# Patient Record
Sex: Male | Born: 1970
Health system: Southern US, Community
[De-identification: ages and names within clinical notes are randomized; demographics above are authoritative.]

## PROBLEM LIST (undated history)

## (undated) DIAGNOSIS — E119 Type 2 diabetes mellitus without complications: Secondary | ICD-10-CM

## (undated) DIAGNOSIS — F329 Major depressive disorder, single episode, unspecified: Secondary | ICD-10-CM

## (undated) DIAGNOSIS — E785 Hyperlipidemia, unspecified: Secondary | ICD-10-CM

## (undated) DIAGNOSIS — Z9289 Personal history of other medical treatment: Secondary | ICD-10-CM

## (undated) DIAGNOSIS — N201 Calculus of ureter: Secondary | ICD-10-CM

## (undated) DIAGNOSIS — F1010 Alcohol abuse, uncomplicated: Secondary | ICD-10-CM

## (undated) DIAGNOSIS — F909 Attention-deficit hyperactivity disorder, unspecified type: Secondary | ICD-10-CM

## (undated) DIAGNOSIS — F32A Depression, unspecified: Secondary | ICD-10-CM

## (undated) DIAGNOSIS — Z87442 Personal history of urinary calculi: Secondary | ICD-10-CM

## (undated) DIAGNOSIS — N200 Calculus of kidney: Secondary | ICD-10-CM

## (undated) DIAGNOSIS — R252 Cramp and spasm: Secondary | ICD-10-CM

## (undated) HISTORY — DX: Type 2 diabetes mellitus without complications: E11.9

## (undated) HISTORY — PX: CARDIAC CATHETERIZATION: SHX172

## (undated) HISTORY — PX: TRANSTHORACIC ECHOCARDIOGRAM: SHX275

## (undated) HISTORY — DX: Depression, unspecified: F32.A

## (undated) HISTORY — DX: Alcohol abuse, uncomplicated: F10.10

## (undated) HISTORY — DX: Hyperlipidemia, unspecified: E78.5

## (undated) HISTORY — DX: Major depressive disorder, single episode, unspecified: F32.9

---

## 1998-06-06 ENCOUNTER — Emergency Department (HOSPITAL_COMMUNITY): Admission: EM | Admit: 1998-06-06 | Discharge: 1998-06-06 | Payer: Self-pay | Admitting: Emergency Medicine

## 2004-05-10 ENCOUNTER — Emergency Department (HOSPITAL_COMMUNITY): Admission: EM | Admit: 2004-05-10 | Discharge: 2004-05-10 | Payer: Self-pay | Admitting: Emergency Medicine

## 2006-01-07 ENCOUNTER — Encounter: Admission: RE | Admit: 2006-01-07 | Discharge: 2006-01-07 | Payer: Self-pay | Admitting: Internal Medicine

## 2006-01-07 IMAGING — CR DG LUMBAR SPINE COMPLETE 4+V
6 series · 6 of 6 positions shown · non-contrast
Comparison: none

CLINICAL DATA: Fall while ice skating.  Low back and sacral pain. 
 FOUR VIEW LUMBAR SPINE:
 There is no evidence of fracture.  Alignment is normal.  The intervertebral disc spaces are within normal limits and no other significant bone abnormalities are identified. 
 IMPRESSION
 Normal study.

[view not recorded (1 of 6)]
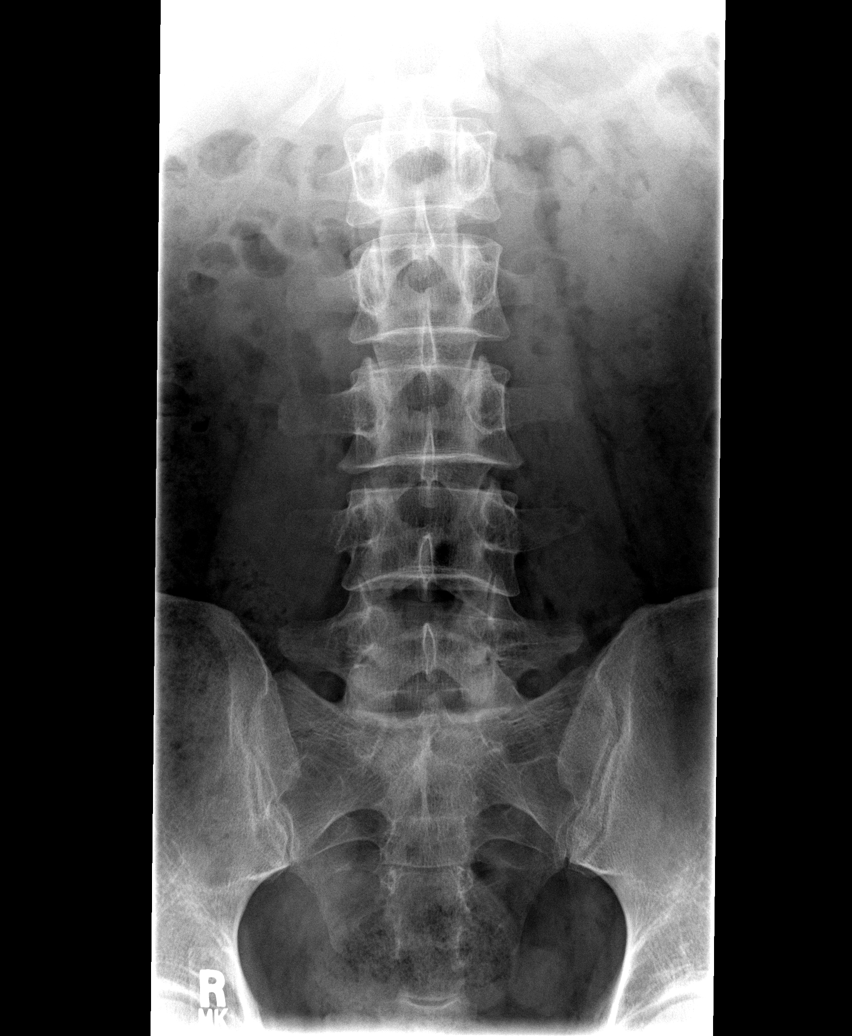

[view not recorded (2 of 6)]
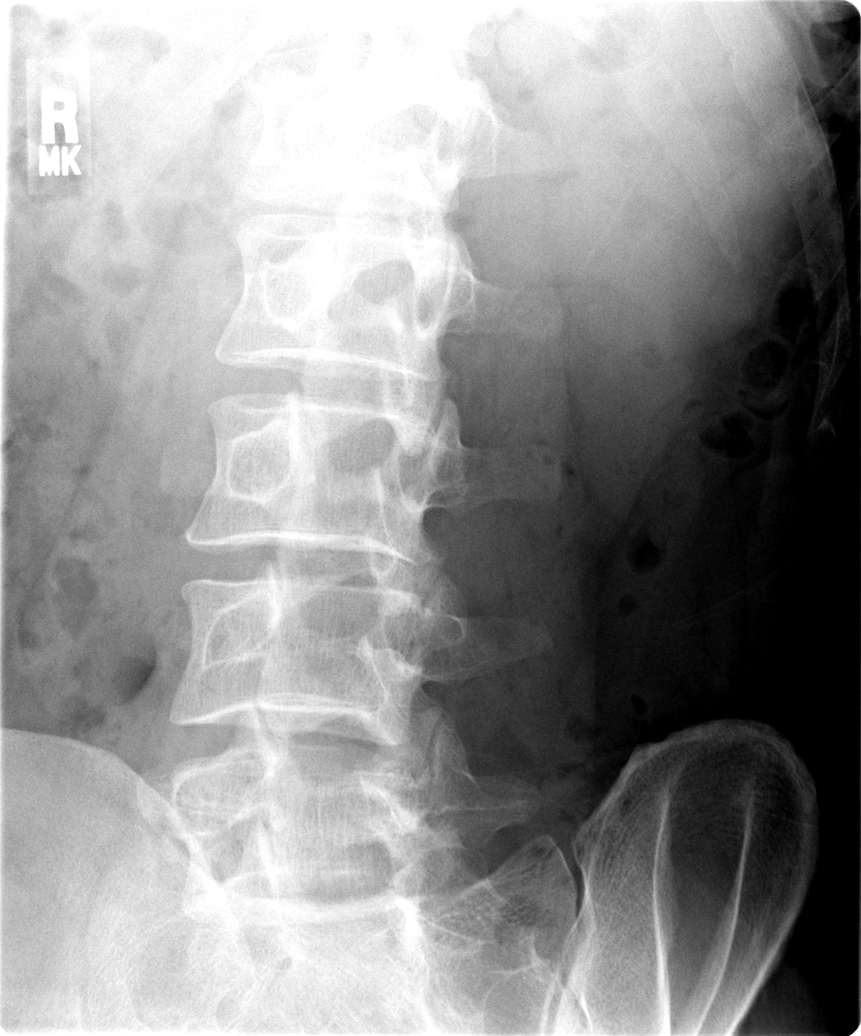

[view not recorded (3 of 6)]
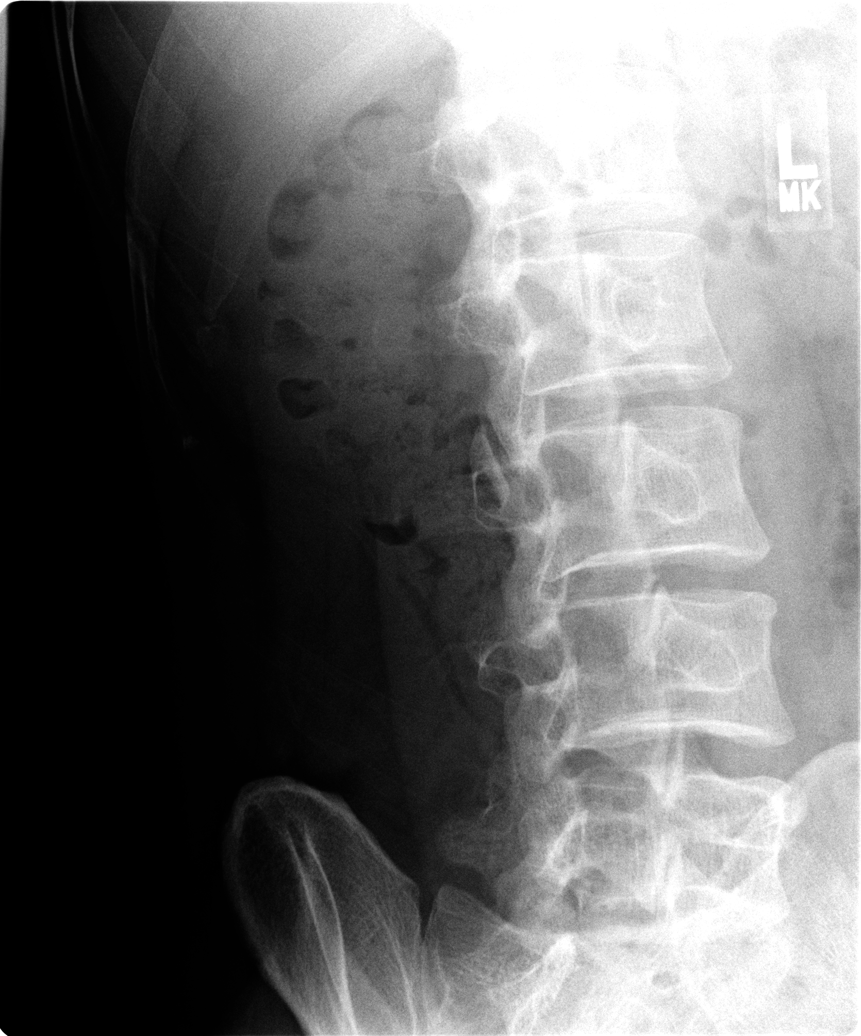

[view not recorded (4 of 6)]
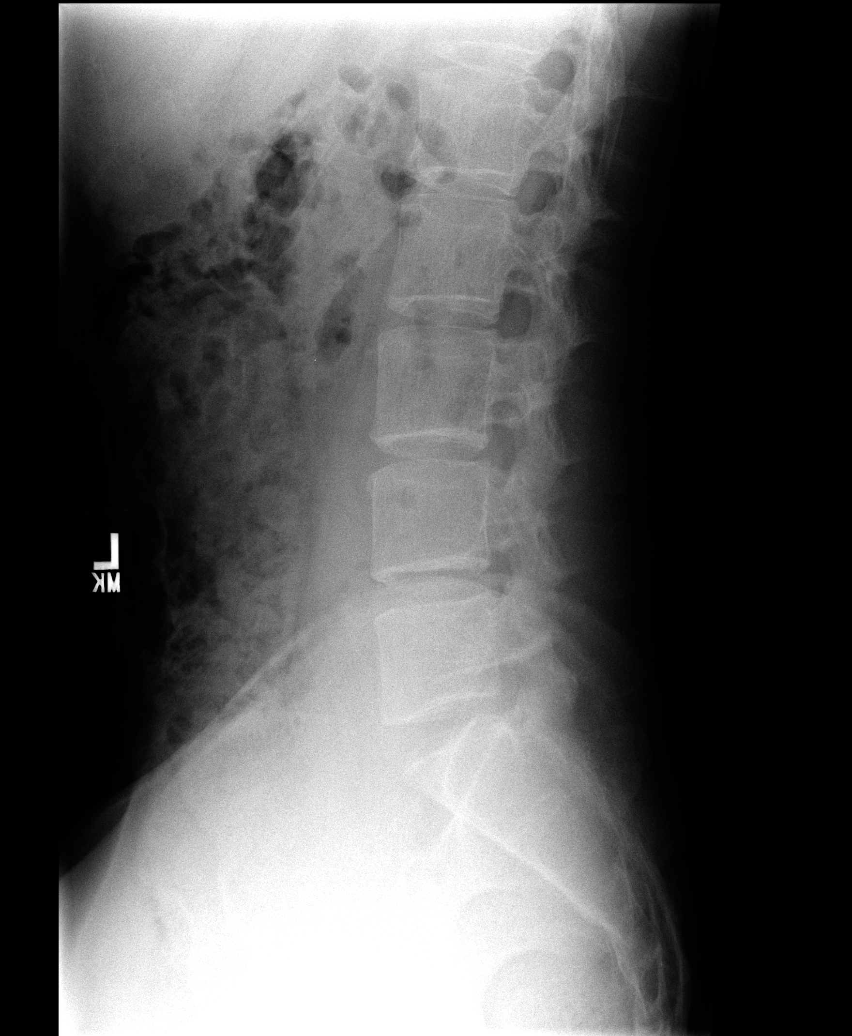

[view not recorded (5 of 6)]
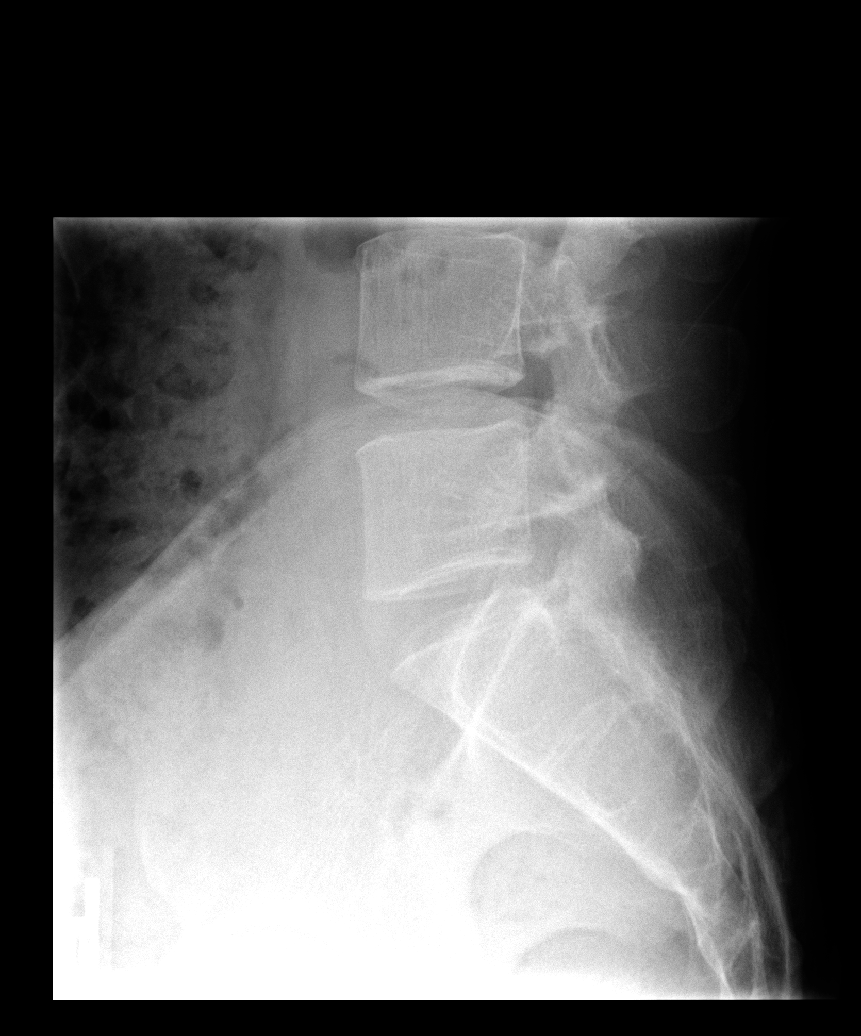

[view not recorded (6 of 6)]
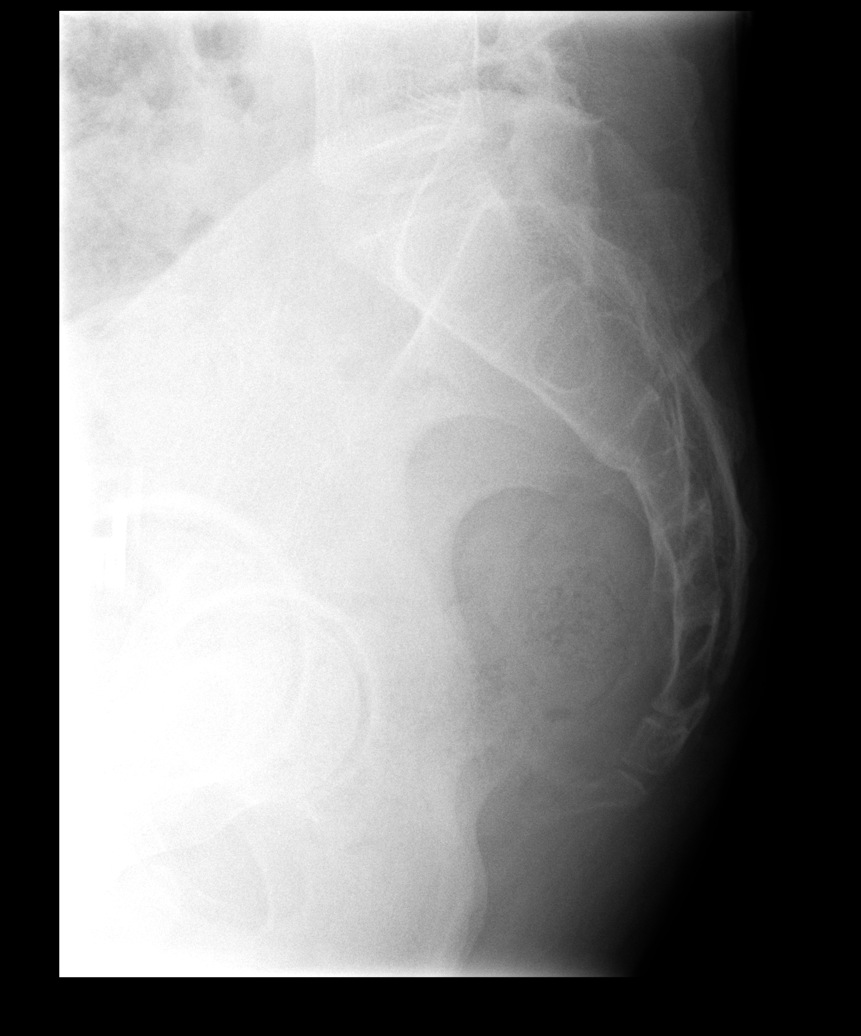

[6 of 6 positions shown; findings below may reference images not displayed]

## 2006-09-04 ENCOUNTER — Ambulatory Visit: Payer: Self-pay | Admitting: Professional

## 2006-09-11 ENCOUNTER — Ambulatory Visit: Payer: Self-pay | Admitting: Professional

## 2006-09-18 ENCOUNTER — Ambulatory Visit: Payer: Self-pay | Admitting: Professional

## 2006-11-21 ENCOUNTER — Emergency Department (HOSPITAL_COMMUNITY): Admission: EM | Admit: 2006-11-21 | Discharge: 2006-11-21 | Payer: Self-pay | Admitting: Emergency Medicine

## 2006-11-21 IMAGING — CR DG CHEST 1V PORT
1 series · 1 of 1 positions shown · non-contrast
Comparison: none

CLINICAL DATA: Chest pain. 
 PORTABLE CHEST - 1 VIEW ([R0] hours):

[view not recorded]
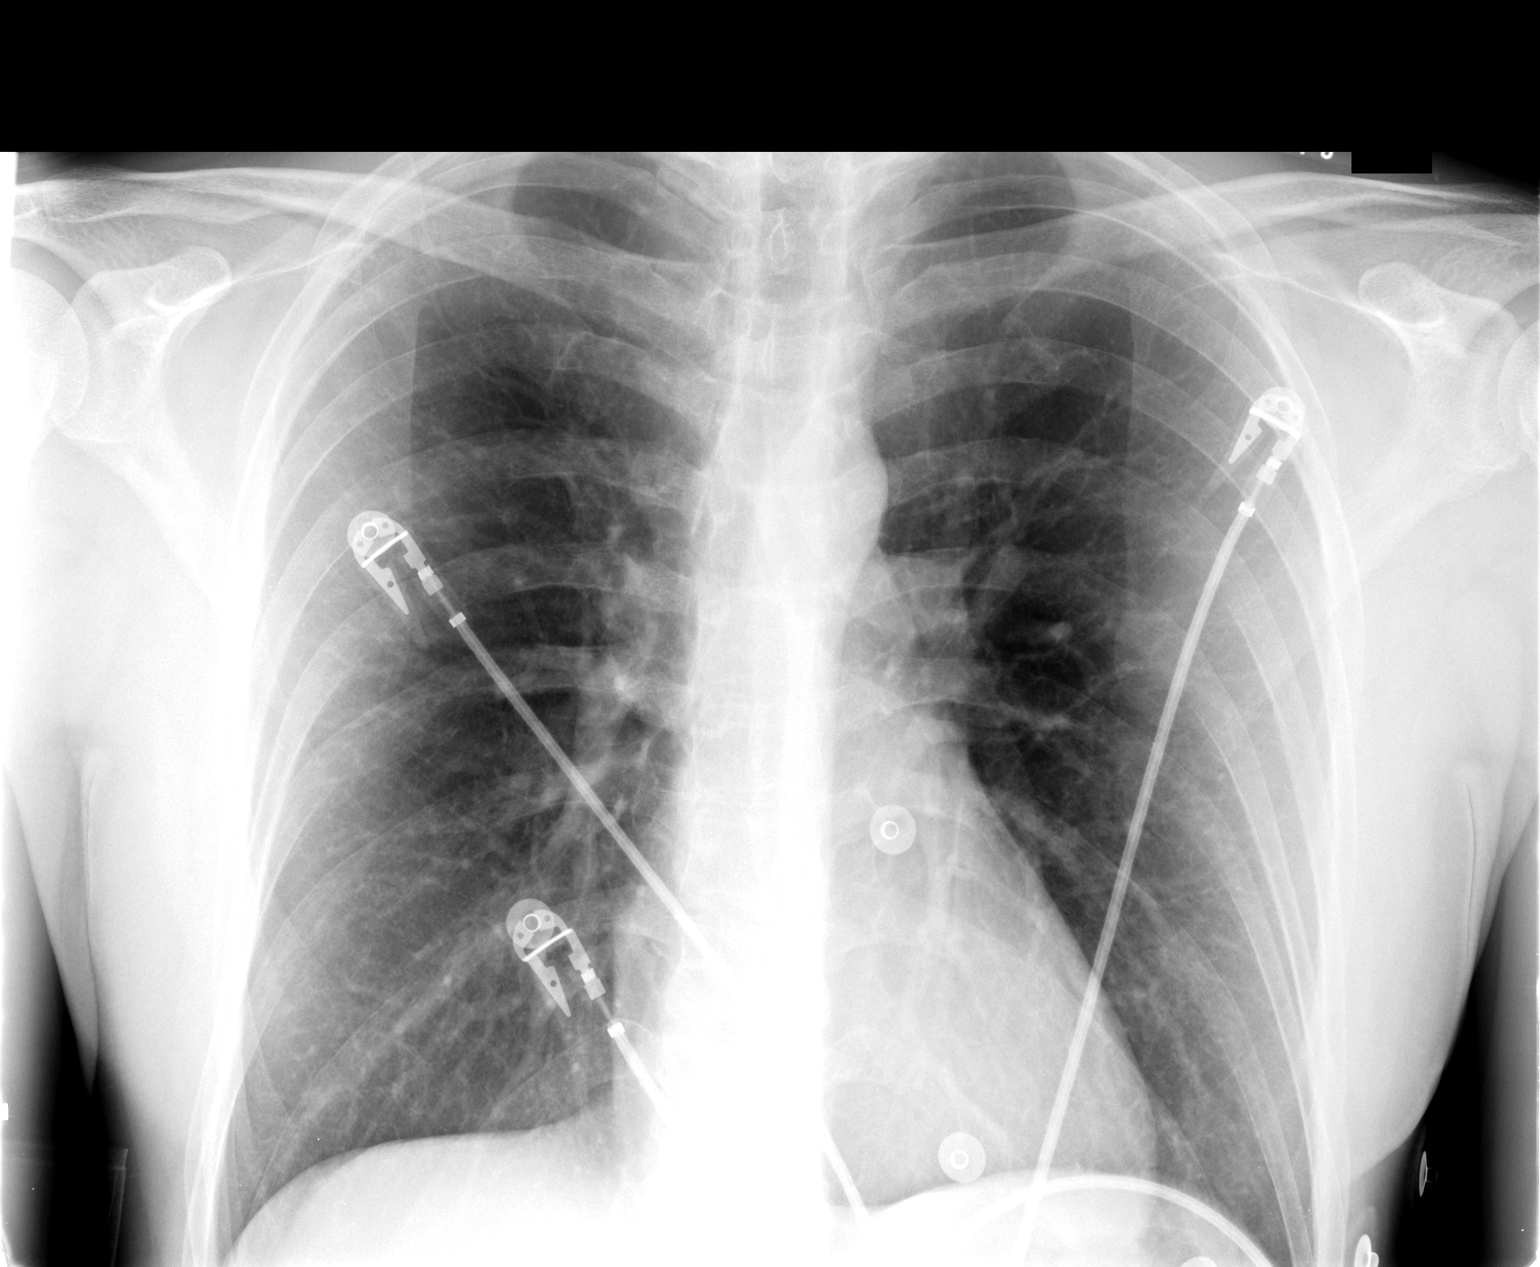

[1 of 1 positions shown; findings below may reference images not displayed]

FINDINGS: The heart size and mediastinal contours are within normal limits.  Both lungs are clear.
IMPRESSION: No acute findings.

## 2006-11-27 ENCOUNTER — Ambulatory Visit (HOSPITAL_COMMUNITY): Admission: RE | Admit: 2006-11-27 | Discharge: 2006-11-27 | Payer: Self-pay | Admitting: *Deleted

## 2008-06-13 ENCOUNTER — Other Ambulatory Visit (HOSPITAL_COMMUNITY): Admission: RE | Admit: 2008-06-13 | Discharge: 2008-09-07 | Payer: Self-pay | Admitting: Psychiatry

## 2008-06-16 ENCOUNTER — Ambulatory Visit: Payer: Self-pay | Admitting: Psychiatry

## 2008-07-21 ENCOUNTER — Ambulatory Visit: Payer: Self-pay | Admitting: Psychiatry

## 2008-08-03 ENCOUNTER — Encounter (INDEPENDENT_AMBULATORY_CARE_PROVIDER_SITE_OTHER): Payer: Self-pay | Admitting: Otolaryngology

## 2008-08-03 ENCOUNTER — Inpatient Hospital Stay (HOSPITAL_COMMUNITY): Admission: EM | Admit: 2008-08-03 | Discharge: 2008-08-04 | Payer: Self-pay | Admitting: Emergency Medicine

## 2008-08-03 HISTORY — PX: OTHER SURGICAL HISTORY: SHX169

## 2008-08-03 IMAGING — CT CT MAXILLOFACIAL W/O CM
2 of 3 series · 15 of 40 positions shown, 18 images · non-contrast
Comparison: None

CLINICAL DATA: Injured playing softball

CT MAXILLOFACIAL WITHOUT CONTRAST
TECHNIQUE: Multidetector CT imaging of the maxillofacial
structures was performed. Multiplanar CT image reconstructions were
also generated.

[Series 2: supine · axial · 0.40mm/px · z∈[+27,+197]mm · 12 of 80 slices shown, 15 images]
[im 6/80  brain]
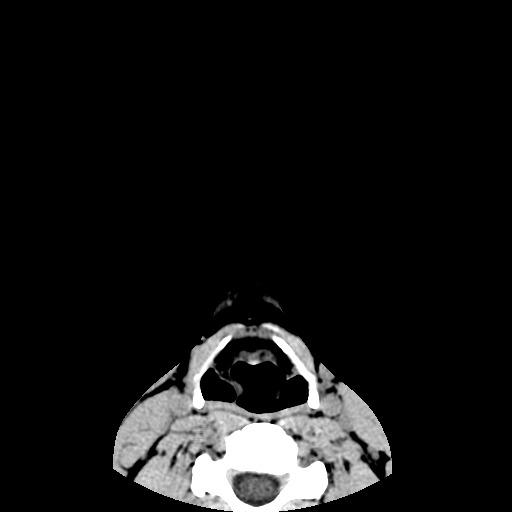
[im 6/80  bone]
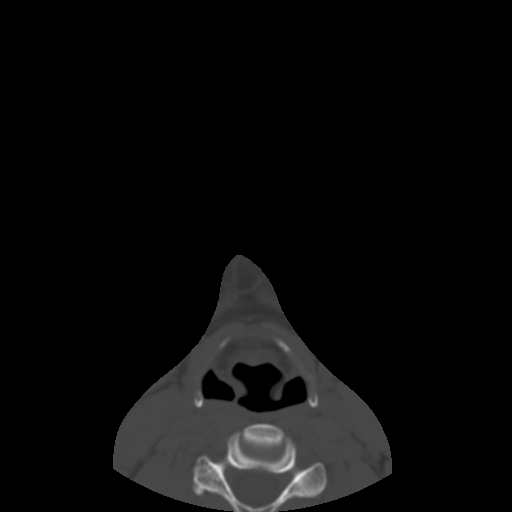
[im 11/80  bone]
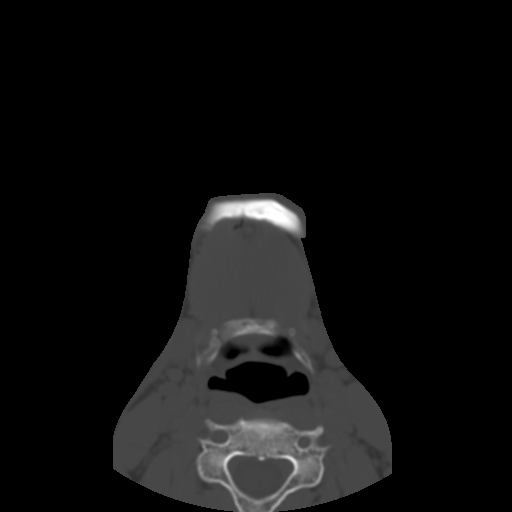
[im 17/80  bone]
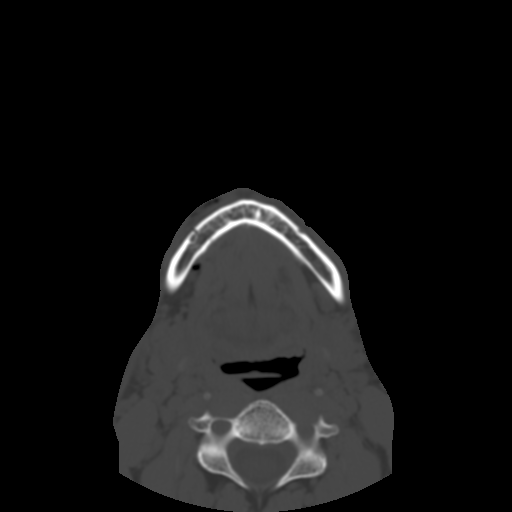
[im 25/80  bone]
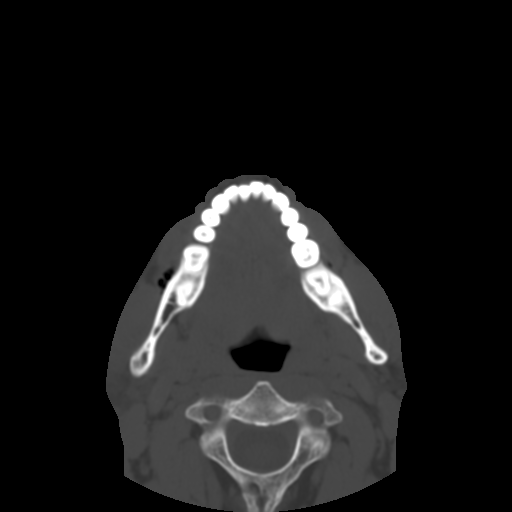
[im 30/80  brain]
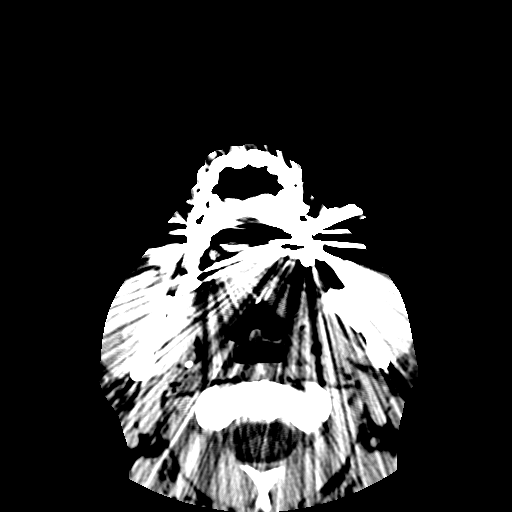
[im 30/80  bone]
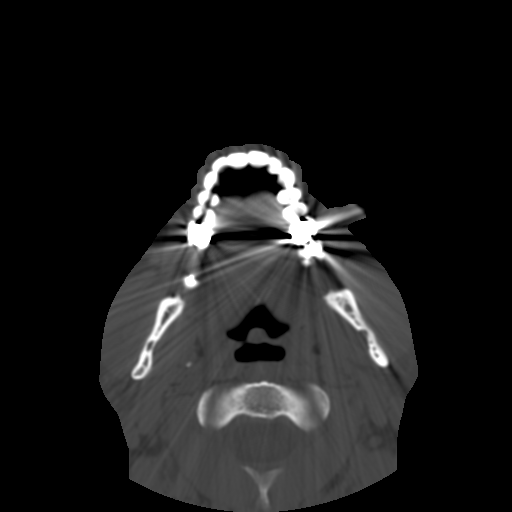
[im 36/80  bone]
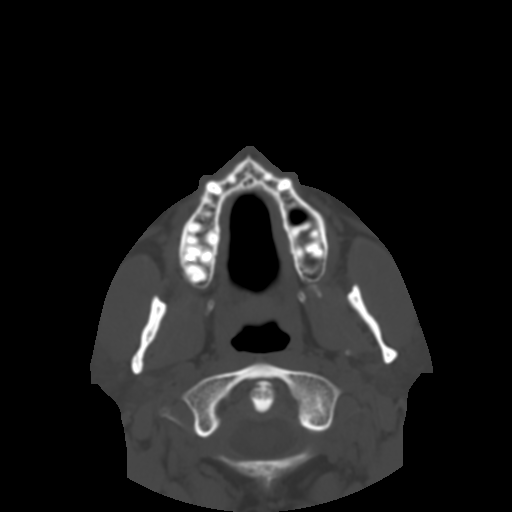
[im 44/80  bone]
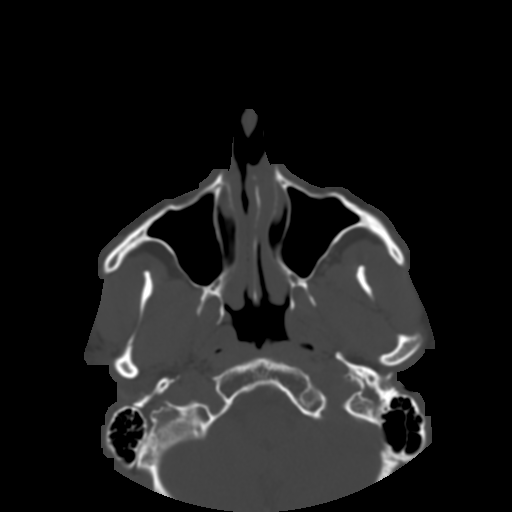
[im 50/80  bone]
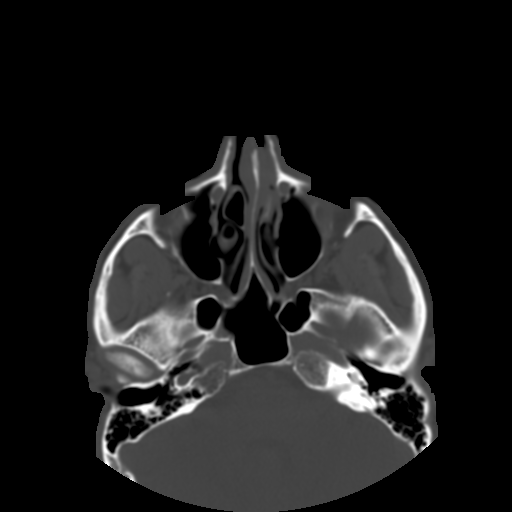
[im 55/80  brain]
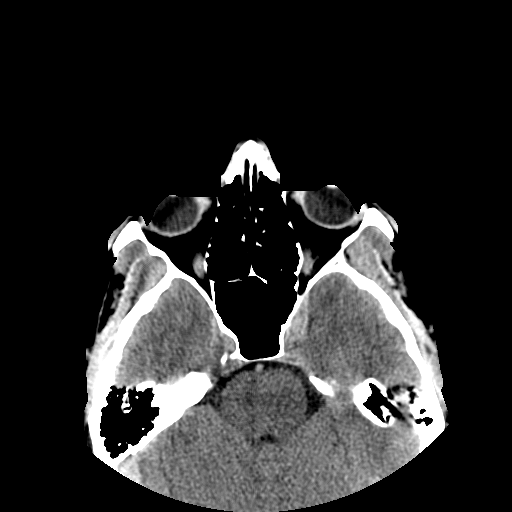
[im 55/80  bone]
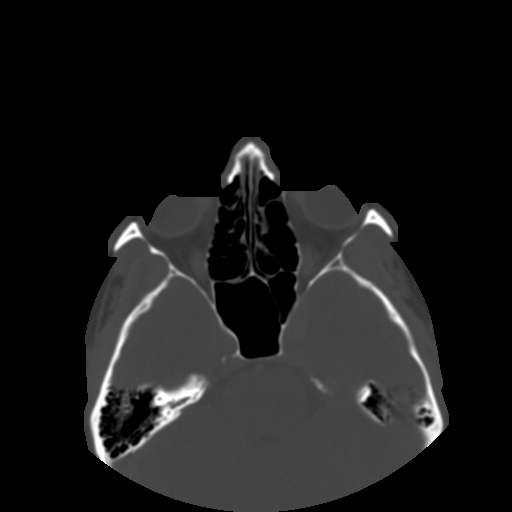
[im 63/80  bone]
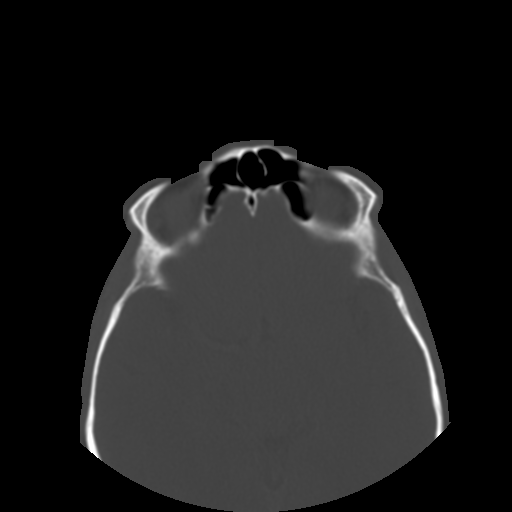
[im 69/80  bone]
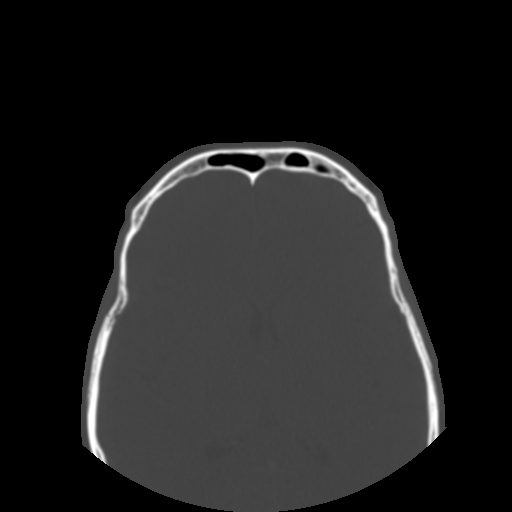
[im 74/80  bone]
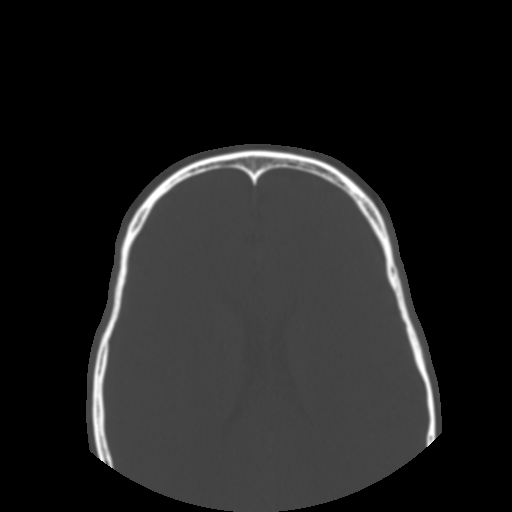

[Series 400: reformatted · coronal · 0.40mm/px · 3 of 74 slices shown]
[im 19/74  bone]
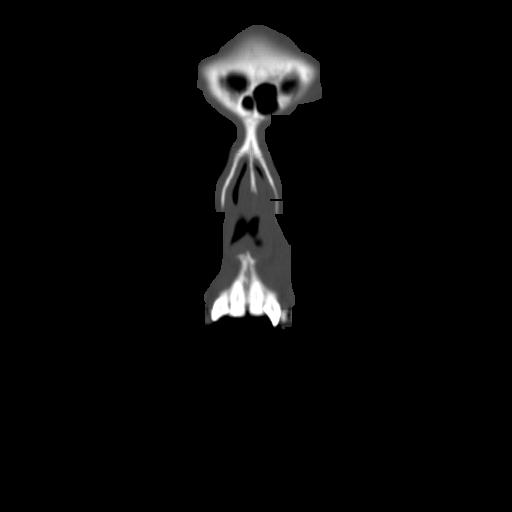
[im 37/74  bone]
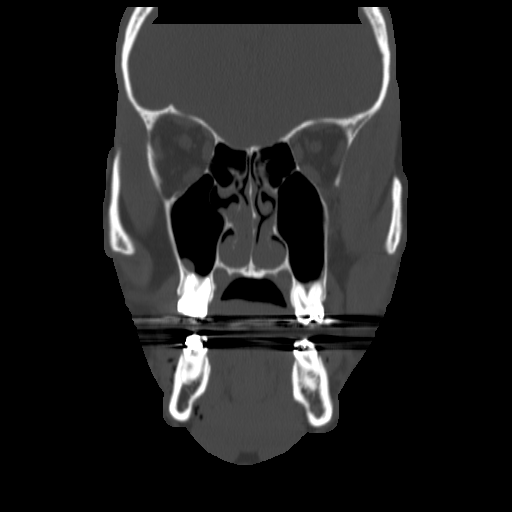
[im 55/74  bone]
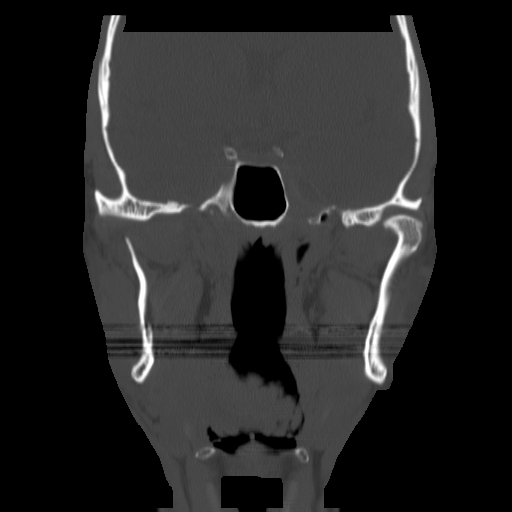

[15 of 40 positions shown; findings below may reference images not displayed]

FINDINGS: There is a nondisplaced fracture involving the
parasymphyseal region of the right mandible.  There is also a
nondisplaced fracture involving the base of the mandibular condyle.
This extends down to the angle region of the mandible.  Both
mandibular condyles are normally located.  No other facial bone
fractures are seen.  The paranasal sinuses mastoid air cells are
grossly clear.  Globes are intact.  The visualized portion of the
brain appears normal.
IMPRESSION: 1.  Nondisplaced right-sided mandible fractures involving the
parasymphyseal region and the base of the condyle and angle of the
mandible.

## 2008-08-05 ENCOUNTER — Ambulatory Visit (HOSPITAL_COMMUNITY): Admission: RE | Admit: 2008-08-05 | Discharge: 2008-08-06 | Payer: Self-pay | Admitting: Otolaryngology

## 2008-08-05 HISTORY — PX: OTHER SURGICAL HISTORY: SHX169

## 2008-08-19 ENCOUNTER — Emergency Department: Payer: Self-pay | Admitting: Emergency Medicine

## 2008-08-26 ENCOUNTER — Encounter: Admission: RE | Admit: 2008-08-26 | Discharge: 2008-08-26 | Payer: Self-pay | Admitting: Otolaryngology

## 2008-08-26 IMAGING — CT CT MAXILLOFACIAL W/O CM
3 of 4 series · 17 of 37 positions shown, 19 images · non-contrast
Comparison: Orthopantogram [DATE] and earlier, CT face
[DATE].

CLINICAL DATA: 37-year-old male status post right mandible
fractures and subsequent surgical repair.

CT MAXILLOFACIAL WITHOUT CONTRAST
TECHNIQUE: Multidetector CT imaging of the maxillofacial
structures was performed. Multiplanar CT image reconstructions were
also generated.

[Series 2: bone windows · axial · 0.33mm/px · z∈[-55,+48]mm · 6 of 67 slices shown]
[im 9/67  bone]
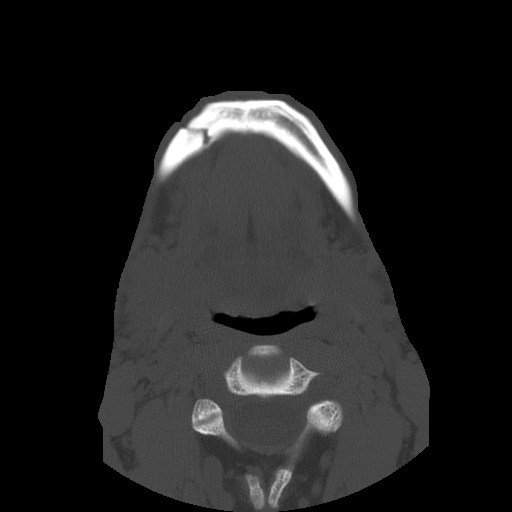
[im 17/67  bone]
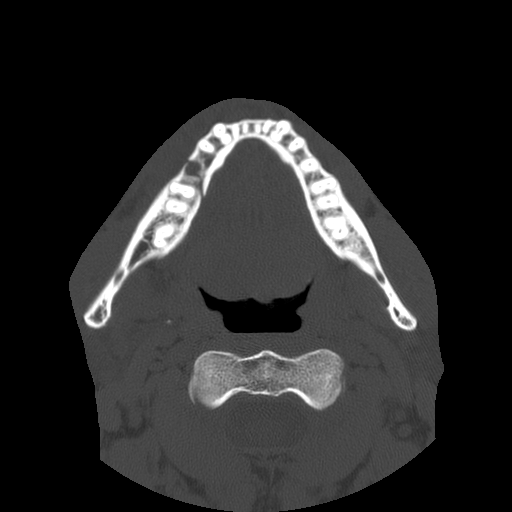
[im 25/67  bone]
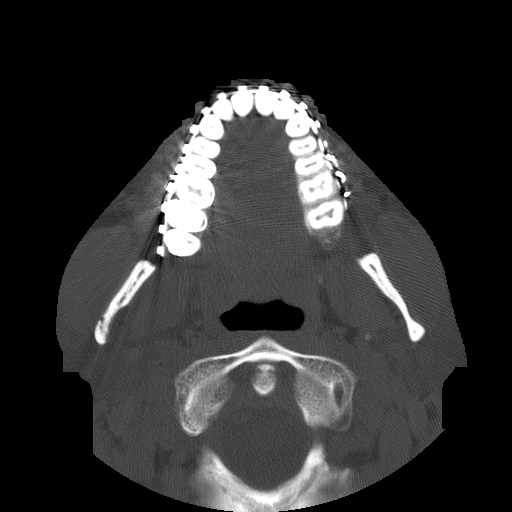
[im 34/67  bone]
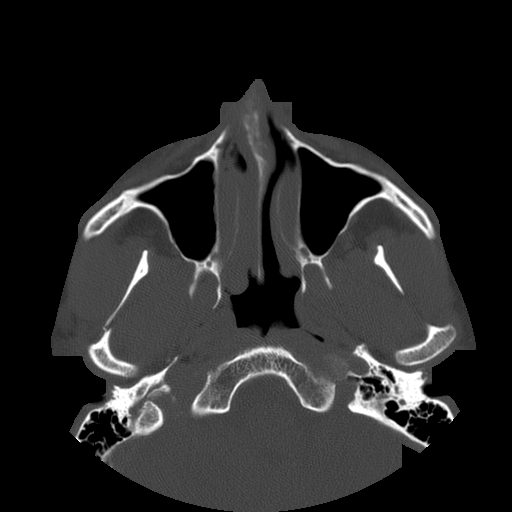
[im 42/67  bone]
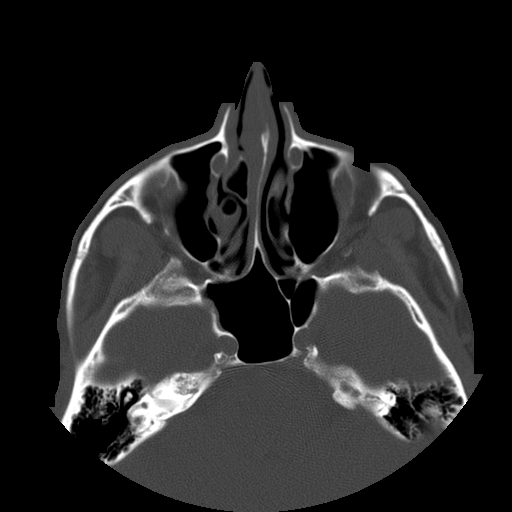
[im 50/67  bone]
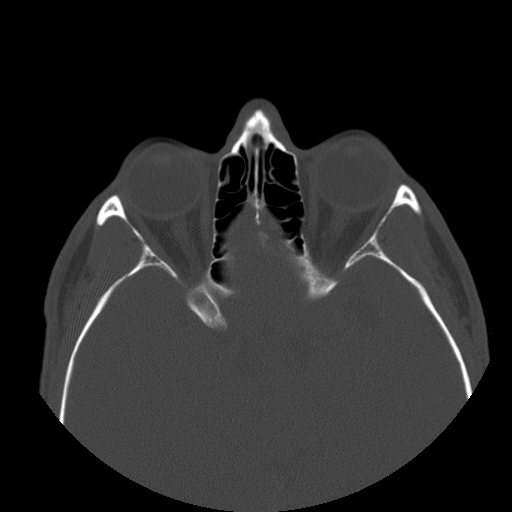

[Series 3: detail windows · axial · 0.33mm/px · z∈[-57,+70]mm · 8 of 67 slices shown, 10 images]
[im 8/67  brain]
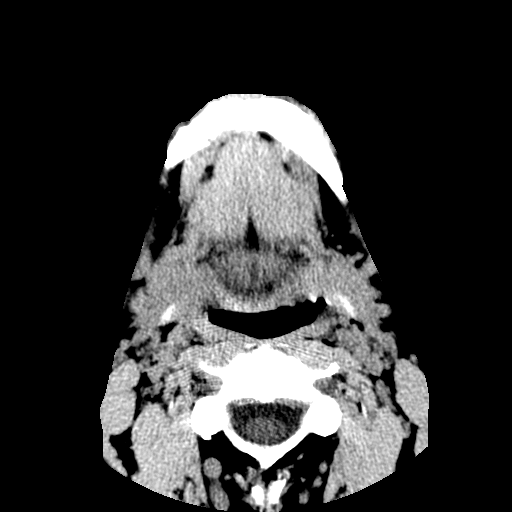
[im 8/67  bone]
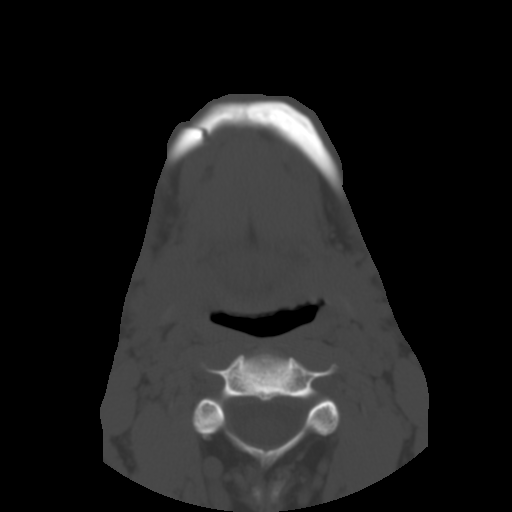
[im 15/67  bone]
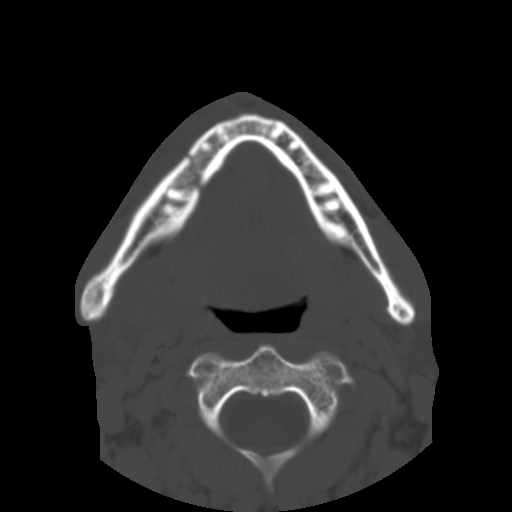
[im 23/67  bone]
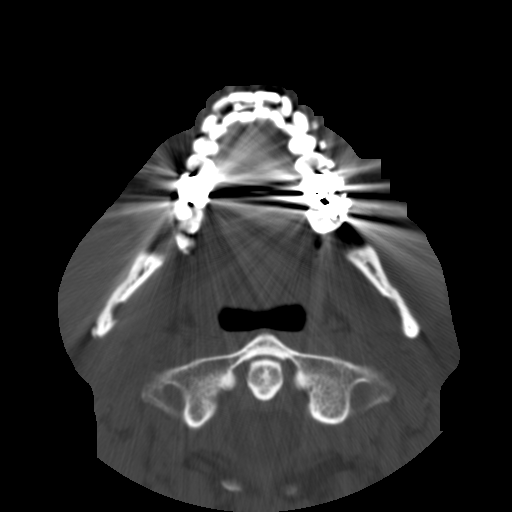
[im 30/67  bone]
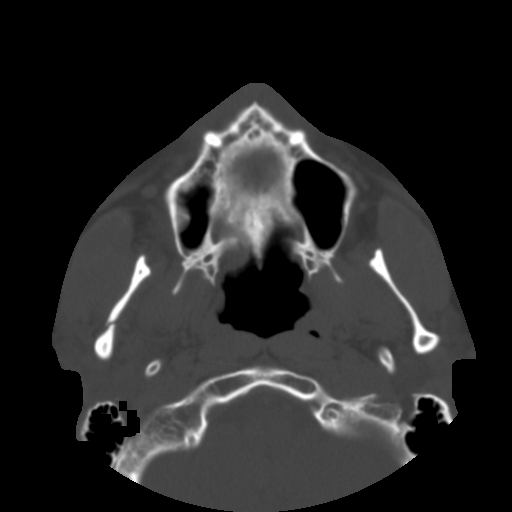
[im 37/67  brain]
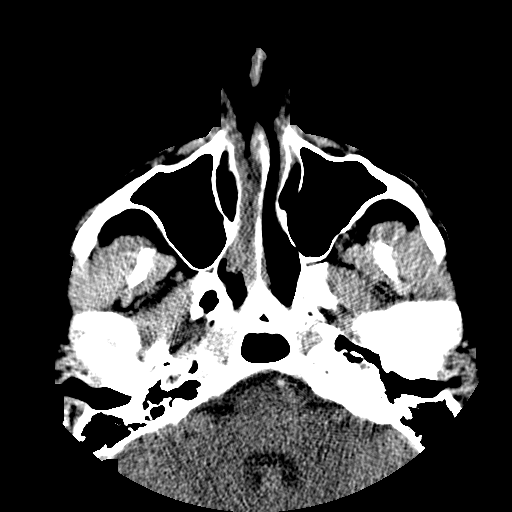
[im 37/67  bone]
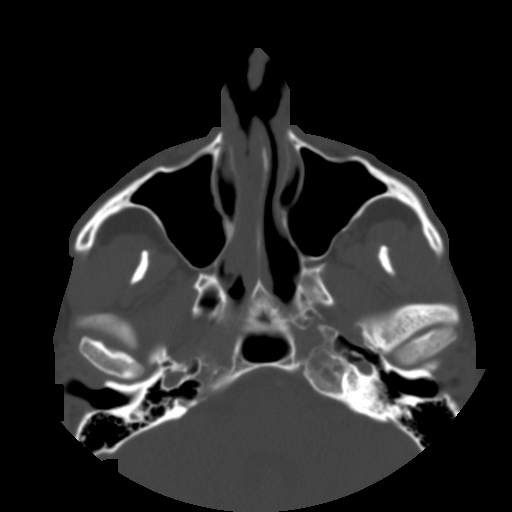
[im 45/67  bone]
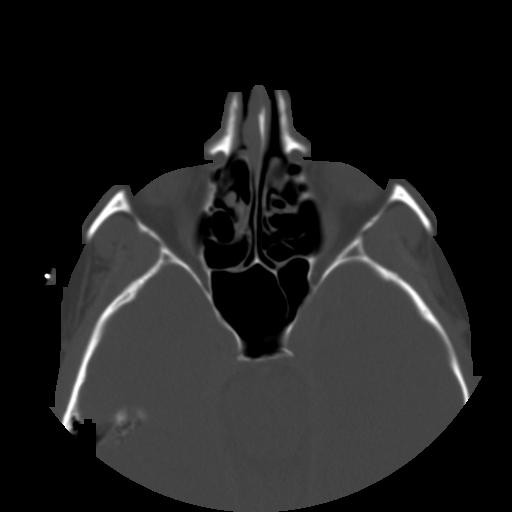
[im 52/67  bone]
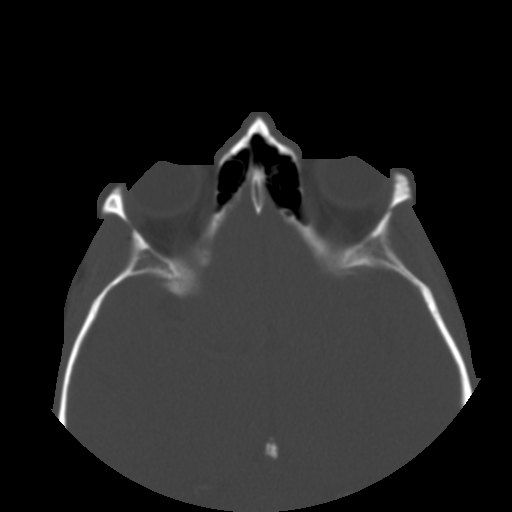
[im 59/67  bone]
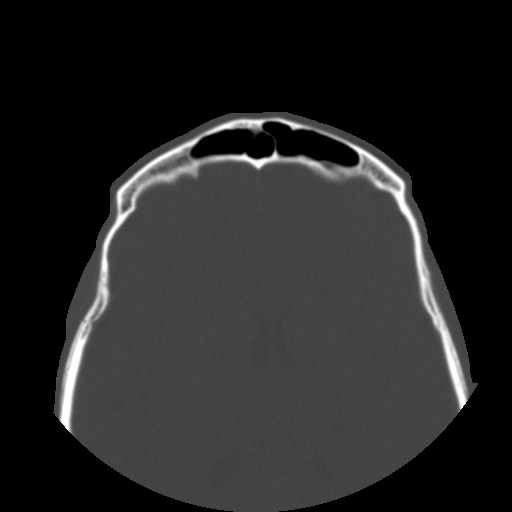

[Series 401: sagittal · coronal · 0.33mm/px · 3 of 96 slices shown]
[im 32/96  bone]
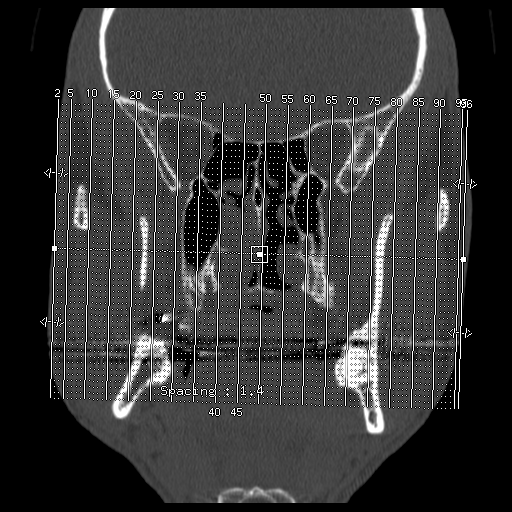
[im 48/96  bone]
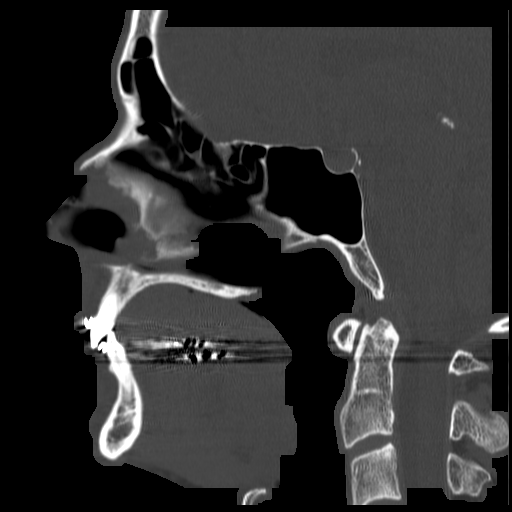
[im 64/96  bone]
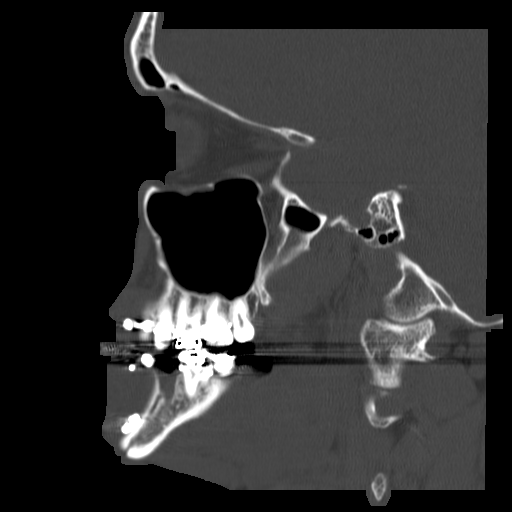

[17 of 37 positions shown; findings below may reference images not displayed]

FINDINGS: Visualized brain parenchyma, orbits and pharyngeal
mucosal spaces are within normal limits.  No cervical
lymphadenopathy. Visualized paranasal sinuses and mastoids are
clear.  Skull base and visualized cervical spine are within normal
limits.

Oblique right parasymphyseal fracture of the mandible is re-
identified with anterior malleable plate and multiple screws
traversing the fracture site.  There is absence of the right second
bicuspid which may also be postoperative in nature, the fracture
plane involve the root of that tooth.  The left mandible is intact.
The right subcondylar mandible fracture is re-identified and now is
mildly displaced.  This fracture extends into the right ramus as
previously noted, just posterior to the course of the right
inferior alveolar nerve.

Other visualized facial bones appear intact.  Interval placement of
dental hardware which may be related to the mandible fixation.
Impacted left mandibular wisdom tooth and right maxillary wisdom
tooth re-identified.
IMPRESSION: 1.  Interval operative fixation of the right parasymphyseal
mandible fracture with no adverse features.
2.  Interval mild displacement of the right subcondylar - ramus
mandible fracture.

## 2008-09-01 ENCOUNTER — Ambulatory Visit: Payer: Self-pay | Admitting: Psychiatry

## 2008-09-15 ENCOUNTER — Ambulatory Visit (HOSPITAL_BASED_OUTPATIENT_CLINIC_OR_DEPARTMENT_OTHER): Admission: RE | Admit: 2008-09-15 | Discharge: 2008-09-15 | Payer: Self-pay | Admitting: Otolaryngology

## 2011-04-30 NOTE — Op Note (Signed)
NAMETILMAN, MCCLAREN NO.:  [id number]   MEDICAL RECORD NO.:  [id number]          PATIENT TYPE:  AMB   LOCATION:  DSC                          FACILITY:  [facility]   PHYSICIAN:  Suzanna Obey, M.D.       DATE OF BIRTH:  1971-06-03   DATE OF PROCEDURE:  09/15/2008  DATE OF DISCHARGE:                               OPERATIVE REPORT   PREOPERATIVE DIAGNOSIS:  Mandible fracture.   POSTOPERATIVE DIAGNOSIS:  Mandible fracture.   SURGICAL PROCEDURES:  Removal of arch bars.   ANESTHESIA:  Local with sedation.   ESTIMATED BLOOD LOSS:  Less than 5 mL.   INDICATIONS:  This is a 40 year old who had a mandible fracture.  He now  has been plated and in wires and rubber bands and is ready to have the  arch bars removed.  He was informed of risks and benefits of the  procedure and options were discussed.  All questions were answered and  consent was obtained.   OPERATION:  The patient was taken to the operating room and placed in  the supine position.  He was given some propofol sedation and the wires  were twisted and removed with the wire cutters and the arch bars were  removed without difficulty.  He tolerated this very well.  There was no  significant irritation or bleeding.  The patient was then awakened and  brought to recovery in stable condition.  Counts correct.           ______________________________  Suzanna Obey, M.D.     JB/MEDQ  D:  09/15/2008  T:  09/15/2008  Job:  [id number]

## 2011-04-30 NOTE — Op Note (Signed)
NAMECRAWFORD, Carlos NO.:  [id number]   MEDICAL RECORD NO.:  [id number]          PATIENT TYPE:  INP   LOCATION:  5155                         FACILITY:  [facility]   PHYSICIAN:  Suzanna Obey, M.D.       DATE OF BIRTH:  Oct 01, 1971   DATE OF PROCEDURE:  08/03/2008  DATE OF DISCHARGE:                               OPERATIVE REPORT   PREOPERATIVE DIAGNOSIS:  Mandible fracture.   POSTOPERATIVE DIAGNOSIS:  Mandible fracture.   SURGICAL PROCEDURE:  Extraction of right mandibular, premolar, and  maxillary mandibular fixation with screws.   ANESTHESIA:  General.   ESTIMATED BLOOD LOSS:  Approximately 10 mL.   INDICATIONS:  This is a 39 year old who has had an injury by a softball  hitting him in the right mandible and caused a mandible fracture.  He  had CT scan evidence of a through-and-through fracture right along the  tooth, and the patient had a lot of bleeding from the area.  This has  decreased and stopped at this point.  He has occlusion that seems to be  reasonably lined up, and the CT scan did not indicate any other injuries  beside the fractures in his condyle.  The patient was informed of the  risks and benefits of the procedure, and options were discussed.  All  questions were answered and consent was obtained.   OPERATION:  The patient was taken to the operating room, placed in the  supine position, and after general endotracheal tube anesthesia, he was  placed in the supine position and draped in the usual sterile manner.  The patient had a fracture line that was identified going right through  the premolar tooth, and that tooth therefore was extracted with dental  manipulator, then the extraction forceps, and it came out very easily as  a single root tooth.  The gingiva was then freed up and closed over the  socket after irrigating with saline.  The teeth were then approximated,  the premolars lined up, and he did have a class II occlusion with a  overbite, but that seemed to be the right position for him.  The screws  bicortical #12 were placed in the maxilla and mandibular, staying medial  to the canine roots.   A #24 wire was placed between the two up and lower screws, and then a  diagonal wire was placed across from right upper to lower left.  The  patient had reasonable hemostasis still oozing from the socket site, but  the NG tube was used to suction the posterior pharynx out of all blood  and debris.  The tongue seemed to be free of the occlusion.  The patient  was then awakened and brought to recovery room in stable condition.  Counts were correct.           ______________________________  Suzanna Obey, M.D.     JB/MEDQ  D:  08/03/2008  T:  08/04/2008  Job:  [id number]

## 2011-04-30 NOTE — Op Note (Signed)
[name], [name] NO.:  [id number]   MEDICAL RECORD NO.:  [id number]          PATIENT TYPE:  OIB   LOCATION:  5015                         FACILITY:  [facility]   PHYSICIAN:  Suzanna Obey, M.D.       DATE OF BIRTH:  May 29, 1971   DATE OF PROCEDURE:  DATE OF DISCHARGE:                               OPERATIVE REPORT   PREOPERATIVE DIAGNOSIS:  Mandible fracture.   POSTOPERATIVE DIAGNOSIS:  Mandible fracture.   SURGICAL PROCEDURES:  Open reduction and internal fixation with a four-  hole plate and maxillomandibular fixation with arch bars.   ANESTHESIA:  General.   ESTIMATED BLOOD LOSS:  Approximately 20 mL.   INDICATIONS:  This is a 40 year old who on Wednesday night had a  maxillomandibular fixation with bicortical screws.  The wires stay  tight, but the segment that was broken did not stay up in the place as  it usually does with the masseter pull and was out of position.  He also  was having pain.  Given this, it was felt that a plate reduction was  necessary, and because of the posterior aspect of the fracture, arch  bars would be helpful as well.  The patient was informed of risk and  benefits of the procedure and options were discussed.  All questions  were answered and consent was obtained.   OPERATION:  The patient was taken to the operating room, placed in the  supine position after nasal endotracheal tube intubation.  The patient  was positioned, and he had indicated that his bite was not quite as much  overbite, but trying to position his teeth into the position that he had  felt was comfortable that would not allow the teeth to lock in as the  molars kept sliding back into their fitting position.  It was felt that  he would just have to be in rubber bands and form his bite, but the  occlusion that he was placed in seemed like the position all of the  molars preferred.  The arch bars were placed with wires and then his  occlusion was positioned with  loop wires.  The right parasymphyseal area  was opened with electrocautery and dissection down to the mental nerve.  The mental nerve was identified preserved and dissection was carried out  underneath the nerve and a four-hole plate was positioned over the  fracture line.  The fracture line was cleaned up and appeared to be in  good approximation.  The plate was positioned with three #8 screws and a  #10 screw.  This made a very tight reduction of the fracture line.  This  was then irrigated with saline and the nerve was intact and the wound  was closed with running 3-0 chromic.  The area was irrigated again and  the teeth were cleaned in the arch bars were cleaned off and they were  in nice tight position.  The posterior pharynx and oral cavity was  suctioned out with an NG tube.  The patient was then awakened, brought  to recovery in stable condition.  Counts correct.           ______________________________  Suzanna Obey, M.D.    JB/MEDQ  D:  08/05/2008  T:  08/06/2008  Job:  [id number]

## 2011-05-03 NOTE — H&P (Signed)
NAMECOURTLAND, REAS NO.:  [id number]   MEDICAL RECORD NO.:  [id number]           PATIENT TYPE:   LOCATION:                                 FACILITY:   PHYSICIAN:  Elmore Guise., M.D.DATE OF BIRTH:  [birth year]   DATE OF ADMISSION:  11/27/2006  DATE OF DISCHARGE:                              HISTORY & PHYSICAL   INDICATION FOR ADMISSION:  Cardiac catheterization because of increased  exertional chest pain.   HISTORY OF PRESENT ILLNESS:  Carlos [name] is a very pleasant 41 year old  white male with past medical history of depression and strong family  history of early coronary disease.  He was initially seen in  consultation approximately one year ago.  At that time, he underwent a  stress echo exercising via accelerated Bruce protocol for 10 minutes,  achieving a peak heart rate of 184 beats per minute corresponding to 99%  of age-predicted maximum.  He had no significant ECG changes and  normal/hyperdynamic wall motion to stress.  He initially did well.  However, the last couple of months, he has been having exertional chest  tightness which he describes as a pressure in his left chest radiating  to his left shoulder and arm.  It is associated with shortness of  breath.  He did have an ER observation on December 7 for evaluation of  chest pain associated with shortness of breath and nausea.  He had an  EKG at that time showing no acute ST- or T-wave changes.  He also had  blood work showing normal renal function and normal cbc and normal  cardiac enzymes.  He presents today for further evaluation.   REVIEW OF SYSTEMS:  As per HPI.  All others are negative.   His current medications are Lunesta, Xanax p.r.n., multivitamin once  daily and omega-3 fish oil 4 tablets daily.   FAMILY HISTORY:  Is positive for heart disease in his mother starting in  her early 80s.   SOCIAL HISTORY:  Is married.  He works as a Administrator.  He exercises at  the gym by lifting  weights.  No tobacco.  He does drink an occasional  beer once or twice per week and has occasional caffeine use.   PAST SURGICAL HISTORY:  Is none.   PHYSICAL EXAMINATION:  His weight is 180 pounds, blood pressure 100/70.  His heart rate 68 and regular.  In general, he is a very pleasant, young appearing white male alert and  oriented x4 in no acute distress.  HEENT:  Is normal.  NECK:  Supple.  No lymphadenopathy, 2+ carotids.  No JVD and no bruits.  LUNGS:  Were clear.  HEART:  Is regular with normal S1-S2.  ABDOMEN:  Soft, nontender, nondistended.  Right femoral has 2+ pulse with 2+ pedal pulses noted bilaterally.   His ECG was reviewed from his ER evaluation and showed normal sinus  rhythm with a rate of 75 per minute, normal axis, normal intervals with  no significant ST or T-wave changes.  His blood work was also reviewed  from that evaluation and  showed normal hemoglobin of 15.6 with a BUN and  creatinine of 10 and 1.0.  He had negative cardiac enzymes, a myoglobin  of 56, CPK-MB of less than 1.0 and troponin I less than 0.05.  His chest  x-ray showed no acute cardiopulmonary findings.   IMPRESSION:  1. Worsening exertional chest pain.  2. Strong family history of early heart disease.   PLAN:  The patient was scheduled for heart catheterization for further  evaluation.  He will have routine blood work done today to check his PT  and PTT.  His hemoglobin and renal function is normal.  I did discuss  risks and benefits of this procedure with him at length, and he wished  to proceed.  This will be scheduled for this Thursday.      Elmore Guise., M.D.  Electronically Signed     TWK/MEDQ  D:  11/24/2006  T:  11/25/2006  Job:  [id number]

## 2011-05-03 NOTE — Cardiovascular Report (Signed)
Carlos [name], Carlos [name] NO.:  [id number]   MEDICAL RECORD NO.:  [id number]          PATIENT TYPE:  OIB   LOCATION:  6524                         FACILITY:  [facility]   PHYSICIAN:  Elmore Guise., M.D.DATE OF BIRTH:  [birth year]   DATE OF PROCEDURE:  11/27/2006  DATE OF DISCHARGE:  11/27/2006                            CARDIAC CATHETERIZATION   INDICATIONS FOR PROCEDURE:  Strong family history of early heart disease  with increased exertional angina with recent ER evaluation.   DESCRIPTION OF PROCEDURE:  The patient was brought to the cardiac cath  lab after appropriate informed consent.  He was prepped and draped in a  sterile fashion.  Approximately 20 mL of 1% lidocaine was used for local  anesthesia.  A 6-French sheath placed in the right femoral artery  without difficulty.  Coronary angiography, LV angiography and limited  right femoral angiography were performed.  The patient tolerated the  procedure well and was transferred from the cardiac cath lab in stable  condition.   FINDINGS:  1. Left Main:  Short and normal appearing.  2. LAD:  Moderate-sized vessel with proximal luminal irregularities      and mid 20% stenosis followed by distal mild luminal      irregularities.  3. D1:  Moderate-sized vessel with mild luminal irregularities.  4. LVX:  Dominant with mild luminal irregularities.  5. OM1:  Moderate size high branching vessel with mild luminal      irregularities.  6. OM2/OM3/OM4:  All moderate size with mild luminal irregularities.  7. RCA:  Nondominant.  Small to moderate-sized vessel with no      significant disease.  8. LV:  EF of 55%.  No wall motion abnormalities.  LVEDP is 12 mmHg.  9. Limited right femoral angiogram was performed showing no      significant disease.  Angio-Seal closure device was not deployed.   IMPRESSION:  1. Nonobstructive coronary arteries.  2. Normal left ventricular systolic function with an ejection fraction  of 55-60% and no wall motion abnormalities.   PLAN:  Aggressive risk factor modification as indicated.  Would  recommend possible GI evaluation of his chest pain if it continues.      Elmore Guise., M.D.  Electronically Signed     TWK/MEDQ  D:  11/27/2006  T:  11/27/2006  Job:  [id number]

## 2011-07-09 ENCOUNTER — Ambulatory Visit: Payer: Self-pay | Admitting: Internal Medicine

## 2011-07-30 ENCOUNTER — Ambulatory Visit: Payer: Self-pay | Admitting: Internal Medicine

## 2011-07-30 DIAGNOSIS — Z0289 Encounter for other administrative examinations: Secondary | ICD-10-CM

## 2011-08-15 ENCOUNTER — Ambulatory Visit: Payer: Self-pay | Admitting: Family Medicine

## 2011-08-15 DIAGNOSIS — Z029 Encounter for administrative examinations, unspecified: Secondary | ICD-10-CM

## 2011-09-12 LAB — URINE DRUGS OF ABUSE SCREEN W ALC, ROUTINE (REF LAB)
Amphetamine Screen, Ur: NEGATIVE
Barbiturate Quant, Ur: NEGATIVE
Benzodiazepines.: NEGATIVE
Cocaine Metabolites: NEGATIVE
Cocaine Metabolites: NEGATIVE
Ethyl Alcohol: <10
Opiate Screen, Urine: NEGATIVE
Phencyclidine (PCP): NEGATIVE

## 2011-09-13 LAB — URINE DRUGS OF ABUSE SCREEN W ALC, ROUTINE (REF LAB)
Amphetamine Screen, Ur: NEGATIVE
Amphetamine Screen, Ur: NEGATIVE
Barbiturate Quant, Ur: NEGATIVE
Cocaine Metabolites: NEGATIVE
Marijuana Metabolite: NEGATIVE
Marijuana Metabolite: NEGATIVE
Methadone: NEGATIVE
Methadone: NEGATIVE
Methadone: NEGATIVE
Opiate Screen, Urine: NEGATIVE
Opiate Screen, Urine: NEGATIVE
Phencyclidine (PCP): NEGATIVE
Phencyclidine (PCP): NEGATIVE
Phencyclidine (PCP): NEGATIVE
Propoxyphene: NEGATIVE
Propoxyphene: NEGATIVE

## 2011-09-18 LAB — URINE DRUGS OF ABUSE SCREEN W ALC, ROUTINE (REF LAB)
Amphetamine Screen, Ur: NEGATIVE
Amphetamine Screen, Ur: NEGATIVE
Barbiturate Quant, Ur: NEGATIVE
Benzodiazepines.: NEGATIVE
Benzodiazepines.: NEGATIVE
Creatinine,U: 142.3
Creatinine,U: 385.1
Creatinine,U: 68.6
Ethyl Alcohol: <10
Ethyl Alcohol: <10
Ethyl Alcohol: <10
Marijuana Metabolite: NEGATIVE
Methadone: NEGATIVE
Methadone: NEGATIVE
Phencyclidine (PCP): NEGATIVE
Propoxyphene: NEGATIVE
Propoxyphene: NEGATIVE

## 2012-12-23 ENCOUNTER — Ambulatory Visit (INDEPENDENT_AMBULATORY_CARE_PROVIDER_SITE_OTHER): Payer: PRIVATE HEALTH INSURANCE | Admitting: Family Medicine

## 2012-12-23 ENCOUNTER — Encounter: Payer: Self-pay | Admitting: Family Medicine

## 2012-12-23 VITALS — BP 120/80 | HR 72 | Temp 97.8°F | Resp 12 | Ht 75.5 in | Wt 185.0 lb

## 2012-12-23 DIAGNOSIS — E785 Hyperlipidemia, unspecified: Secondary | ICD-10-CM

## 2012-12-23 DIAGNOSIS — Z8659 Personal history of other mental and behavioral disorders: Secondary | ICD-10-CM

## 2012-12-23 DIAGNOSIS — F1010 Alcohol abuse, uncomplicated: Secondary | ICD-10-CM | POA: Insufficient documentation

## 2012-12-23 HISTORY — DX: Alcohol abuse, uncomplicated: F10.10

## 2012-12-23 NOTE — Progress Notes (Signed)
  Subjective:    Patient ID: Carlos [name], male    DOB: 06-Nov-1971, 42 y.o.   MRN: [id number]  HPI Patient here to establish care. Past medical history reviewed. He has history of alcohol and drug abuse. Remote history of cocaine abuse. Still has had some intermittent issues of alcohol abuse. Had counseling in the past and does not drink regularly more binge. He does not tend to binge for several days and only has exacerbations where he drinks too much with one episode. He does have desire to quit altogether. He realizes this is called some family issues. He is specifically read about medications such as Vivitrol.  Has not had any alcohol in the past couple of weeks strong family history of alcohol abuse in father and maternal grandfather.  History reported hyperlipidemia. Not treated with medication. Past history of depression currently stable.  Family history positive alcohol abuse as above. Mother with history of type 1 diabetes and coronary artery disease. Strong family history of diabetes and maternal grandparents and uncles.  Patient is married. 2 children ages 91 and 22. Nonsmoker. Alcohol use as above.   Review of Systems  Constitutional: Negative for appetite change, fatigue and unexpected weight change.  Eyes: Negative for visual disturbance.  Respiratory: Negative for cough, chest tightness and shortness of breath.   Cardiovascular: Negative for chest pain, palpitations and leg swelling.  Gastrointestinal: Negative for abdominal pain.  Neurological: Negative for dizziness, syncope, weakness, light-headedness and headaches.  Psychiatric/Behavioral: Negative for confusion, dysphoric mood and agitation.       Objective:   Physical Exam  Constitutional: He appears well-developed and well-nourished.  Neck: Neck supple. No thyromegaly present.  Cardiovascular: Normal rate and regular rhythm.   No murmur heard. Pulmonary/Chest: Effort normal and breath sounds normal. No respiratory  distress. He has no wheezes. He has no rales.  Psychiatric: He has a normal mood and affect. His behavior is normal. Thought content normal.          Assessment & Plan:  #1 history of alcohol abuse. We discussed things like counseling which he's had in the past. He has some interest in medication options. Explained that we do not give injections such as Vivitrol.  We discussed use of serotonin reuptake inhibitors such as fluoxetine and at this point he wishes to wait. #2 history of reported hyperlipidemia.  schedule complete physical and obtain fasting lipids  #3 past history of depression currently stable off medication #4 reported history of prediabetes. Fasting labs as above

## 2012-12-30 ENCOUNTER — Other Ambulatory Visit (INDEPENDENT_AMBULATORY_CARE_PROVIDER_SITE_OTHER): Payer: PRIVATE HEALTH INSURANCE

## 2012-12-30 DIAGNOSIS — Z0000 Encounter for general adult medical examination without abnormal findings: Secondary | ICD-10-CM

## 2012-12-30 LAB — LIPID PANEL
Cholesterol: 198 mg/dL (ref 0–200)
HDL: 38.2 mg/dL — ABNORMAL LOW (ref >39.00)
Triglycerides: 352 mg/dL — ABNORMAL HIGH (ref 0.0–149.0)

## 2012-12-30 LAB — POCT URINALYSIS DIPSTICK: Bilirubin, UA: NEGATIVE

## 2012-12-30 LAB — HEPATIC FUNCTION PANEL
AST: 16 U/L (ref 0–37)
Albumin: 4.1 g/dL (ref 3.5–5.2)
Total Bilirubin: 1 mg/dL (ref 0.3–1.2)
Total Protein: 7.3 g/dL (ref 6.0–8.3)

## 2012-12-30 LAB — LDL CHOLESTEROL, DIRECT: Direct LDL: 87.4 mg/dL

## 2012-12-30 LAB — CBC WITH DIFFERENTIAL/PLATELET
Basophils Absolute: 0 10*3/uL (ref 0.0–0.1)
Lymphs Abs: 1.8 10*3/uL (ref 0.7–4.0)
MCV: 88.3 fl (ref 78.0–100.0)
RDW: 12.6 % (ref 11.5–14.6)

## 2012-12-30 LAB — BASIC METABOLIC PANEL
BUN: 15 mg/dL (ref 6–23)
Calcium: 9 mg/dL (ref 8.4–10.5)
Chloride: 99 mEq/L (ref 96–112)
GFR: 87.74 mL/min (ref >60.00)
Potassium: 4.4 mEq/L (ref 3.5–5.1)
Sodium: 134 mEq/L — ABNORMAL LOW (ref 135–145)

## 2012-12-30 LAB — HEMOGLOBIN A1C: Hgb A1c MFr Bld: 10.3 % — ABNORMAL HIGH (ref 4.6–6.5)

## 2012-12-30 LAB — TSH: TSH: 1.78 u[IU]/mL (ref 0.35–5.50)

## 2012-12-31 NOTE — Progress Notes (Signed)
Quick Note:  Pt informed on VM ______ 

## 2013-01-06 ENCOUNTER — Telehealth: Payer: Self-pay | Admitting: Family Medicine

## 2013-01-06 ENCOUNTER — Encounter: Payer: Self-pay | Admitting: Family Medicine

## 2013-01-06 ENCOUNTER — Ambulatory Visit (INDEPENDENT_AMBULATORY_CARE_PROVIDER_SITE_OTHER): Payer: PRIVATE HEALTH INSURANCE | Admitting: Family Medicine

## 2013-01-06 VITALS — BP 120/88 | HR 72 | Temp 97.2°F | Resp 12 | Ht 75.75 in | Wt 182.0 lb

## 2013-01-06 DIAGNOSIS — Z0000 Encounter for general adult medical examination without abnormal findings: Secondary | ICD-10-CM

## 2013-01-06 DIAGNOSIS — E119 Type 2 diabetes mellitus without complications: Secondary | ICD-10-CM

## 2013-01-06 MED ORDER — METFORMIN HCL 500 MG PO TABS: 500.0000 mg | ORAL_TABLET | Freq: Two times a day (BID) | ORAL | Status: DC

## 2013-01-06 NOTE — Telephone Encounter (Signed)
Pt informed

## 2013-01-06 NOTE — Telephone Encounter (Signed)
Pt states MD mentioned getting him a referral to a diabetic nutritionalist, but was never actually done. Pt would like to pursue this.

## 2013-01-06 NOTE — Telephone Encounter (Signed)
I have sent in referral.  This was just done this AM!  Please notify him this has to be set up and we will call him.

## 2013-01-06 NOTE — Patient Instructions (Signed)

## 2013-01-06 NOTE — Progress Notes (Signed)
Subjective:    Patient ID: Carlos [name], male    DOB: 1971/03/05, 42 y.o.   MRN: [id number]  HPI Patient here for complete physical. Refer to prior note. Takes no medications. He has history of alcohol abuse and is trying to become abstinent altogether. Past history of depression currently stable. History of dyslipidemia. History of prediabetes. Patient in previous practice couple years ago had islet cell antibody tests which were negative. Recently had one hour postprandial blood sugar 271 and fasting blood sugar 291 on labs for this physical. Tetanus up-to-date. Nonsmoker. No chest pain with exertion.  Patient relates today several week history of increased urine frequency without burning, increased thirst, and some mild weight loss. Good appetite  Past Medical History  Diagnosis Date  . Depression   . Allergy   . Hyperlipidemia   . Alcohol abuse    Past Surgical History  Procedure Date  . Mandible fracture surgery 2010    playing baseball    reports that he has never smoked. He does not have any smokeless tobacco history on file. His alcohol and drug histories not on file. family history includes Alcohol abuse in his father, maternal grandfather, and maternal uncle; Diabetes in his maternal grandfather, maternal grandmother, and mother; and Heart disease in his mother. No Known Allergies    Review of Systems  Constitutional: Positive for unexpected weight change. Negative for fever, activity change, appetite change and fatigue.  HENT: Negative for ear pain, congestion and trouble swallowing.   Eyes: Negative for pain and visual disturbance.  Respiratory: Negative for cough, shortness of breath and wheezing.   Cardiovascular: Negative for chest pain and palpitations.  Gastrointestinal: Negative for nausea, vomiting, abdominal pain, diarrhea, constipation, blood in stool, abdominal distention and rectal pain.  Genitourinary: Positive for frequency. Negative for dysuria, hematuria  and testicular pain.  Musculoskeletal: Negative for joint swelling and arthralgias.  Skin: Negative for rash.  Neurological: Negative for dizziness, syncope and headaches.  Hematological: Negative for adenopathy.  Psychiatric/Behavioral: Negative for confusion and dysphoric mood.       Objective:   Physical Exam  Constitutional: He is oriented to person, place, and time. He appears well-developed and well-nourished. No distress.  HENT:  Head: Normocephalic and atraumatic.  Right Ear: External ear normal.  Left Ear: External ear normal.  Mouth/Throat: Oropharynx is clear and moist.  Eyes: Conjunctivae normal and EOM are normal. Pupils are equal, round, and reactive to light.  Neck: Normal range of motion. Neck supple. No thyromegaly present.  Cardiovascular: Normal rate, regular rhythm and normal heart sounds.   No murmur heard. Pulmonary/Chest: No respiratory distress. He has no wheezes. He has no rales.  Abdominal: Soft. Bowel sounds are normal. He exhibits no distension and no mass. There is no tenderness. There is no rebound and no guarding.  Musculoskeletal: He exhibits no edema.  Lymphadenopathy:    He has no cervical adenopathy.  Neurological: He is alert and oriented to person, place, and time. He displays normal reflexes. No cranial nerve deficit.  Skin: No rash noted.  Psychiatric: He has a normal mood and affect.          Assessment & Plan:  Complete physical. Labs reviewed with patient. He had very high blood sugar of 291 which was fasting. Dyslipidemia with high triglycerides and low HDL. Hemoglobin A1c 10.3%.  Patient clearly has diabetes. Previous islet cell antibodies negative. Suspect this represents type 2 diabetes though he is not overweight-?latent onset autoimmune diabetes of adulthood .  Set up further diabetes education. Home monitor given. Start metformin 500 mg twice daily. Set up follow up.  Low threshold to start insulin if blood sugars not improving on  oral meds.

## 2013-01-11 ENCOUNTER — Other Ambulatory Visit: Payer: Self-pay | Admitting: *Deleted

## 2013-01-11 MED ORDER — GLUCOSE BLOOD VI STRP: ORAL_STRIP | Status: DC

## 2013-01-12 ENCOUNTER — Other Ambulatory Visit: Payer: Self-pay | Admitting: *Deleted

## 2013-01-12 MED ORDER — GLUCOSE BLOOD VI STRP: ORAL_STRIP | Status: DC

## 2013-01-27 ENCOUNTER — Ambulatory Visit: Payer: PRIVATE HEALTH INSURANCE | Admitting: Family Medicine

## 2013-01-29 ENCOUNTER — Ambulatory Visit: Payer: PRIVATE HEALTH INSURANCE | Admitting: Family Medicine

## 2013-02-03 ENCOUNTER — Encounter: Payer: Self-pay | Admitting: Family Medicine

## 2013-02-03 ENCOUNTER — Ambulatory Visit (INDEPENDENT_AMBULATORY_CARE_PROVIDER_SITE_OTHER): Payer: PRIVATE HEALTH INSURANCE | Admitting: Family Medicine

## 2013-02-03 VITALS — BP 110/72 | Temp 98.8°F

## 2013-02-03 DIAGNOSIS — E119 Type 2 diabetes mellitus without complications: Secondary | ICD-10-CM

## 2013-02-03 NOTE — Progress Notes (Signed)
  Subjective:    Patient ID: Carlos [name], male    DOB: [birth year], 42 y.o.   MRN: [id number]  HPI Followup type 2 diabetes. Recently diagnosed. A1c 10.3%. Initiated metformin 500 mg twice daily. Compliant with therapy. Tolerating with no side effects. Resolution of urine frequency and thirst  Patient started cross fit training program. Diabetic education pending. Blood sugars greatly improved. Previously had fasting blood sugars up to 270 most recently low 100s. No side effects from metformin.  Past Medical History  Diagnosis Date  . Depression   . Allergy   . Hyperlipidemia   . Alcohol abuse    Past Surgical History  Procedure Laterality Date  . Mandible fracture surgery  2010    playing baseball    reports that he has never smoked. He does not have any smokeless tobacco history on file. His alcohol and drug histories are not on file. family history includes Alcohol abuse in his father, maternal grandfather, and maternal uncle; Diabetes in his maternal grandfather, maternal grandmother, and mother; and Heart disease in his mother. No Known Allergies    Review of Systems  Constitutional: Negative for fever and chills.  Respiratory: Negative for shortness of breath.   Cardiovascular: Negative for chest pain.  Endocrine: Negative for polydipsia, polyphagia and polyuria.       Objective:   Physical Exam  Constitutional: He appears well-developed and well-nourished.  Cardiovascular: Normal rate and regular rhythm.   Pulmonary/Chest: Effort normal and breath sounds normal. No respiratory distress. He has no wheezes. He has no rales.  Musculoskeletal: He exhibits no edema.          Assessment & Plan:  Diabetes. We had some initial concern for possible late onset autoimmune diabetes of adulthood given the fact he is not significantly overweight.  However, he has responded very well to metformin and exercise. Continue current regimen. Recheck A1c in 3 months. Diabetic  education pending.

## 2013-03-04 ENCOUNTER — Ambulatory Visit (INDEPENDENT_AMBULATORY_CARE_PROVIDER_SITE_OTHER): Payer: PRIVATE HEALTH INSURANCE | Admitting: Family Medicine

## 2013-03-04 ENCOUNTER — Encounter: Payer: Self-pay | Admitting: Family Medicine

## 2013-03-04 VITALS — BP 120/74 | HR 72 | Temp 98.0°F | Resp 12 | Wt 180.0 lb

## 2013-03-04 DIAGNOSIS — R0789 Other chest pain: Secondary | ICD-10-CM

## 2013-03-04 DIAGNOSIS — R079 Chest pain, unspecified: Secondary | ICD-10-CM

## 2013-03-04 NOTE — Patient Instructions (Addendum)
We will call you regarding stress test Follow up immediately for any exertional chest pain or worsening symptoms.

## 2013-03-04 NOTE — Progress Notes (Signed)
  Subjective:    Patient ID: Carlos [name], male    DOB: Jan 19, 1971, 42 y.o.   MRN: [id number]  HPI Patient seen with chest pain  Patient's had a couple episodes over the past week both occurring at work and at rest left substernal tightness. Each occasion had some associated shortness of breath. Symptoms only lasted about 30 seconds. Denies any neck pain or arm pain. No recent GERD symptoms. No musculoskeletal soreness or tenderness. Denied any nausea or vomiting. No diaphoresis.  Patient has been exercising about 2 days per week fairly vigorously without any chest pain whatsoever. Had somewhat similar chest pain about 7 years ago and had Catheterization then which showed only minimal irregularities in multiple vessels Has been dealing with significant stress at work recently which he thinks exacerbates  Patient nonsmoker. Mother had coronary disease in her 94s. Patient recently diagnosed with type 2 diabetes. He has history of low HDL high triglycerides. No hypertension history.  Past Medical History  Diagnosis Date  . Depression   . Allergy   . Hyperlipidemia   . Alcohol abuse   . Diabetes mellitus without complication 1/14    type 2   Past Surgical History  Procedure Laterality Date  . Mandible fracture surgery  2010    playing baseball    reports that he has never smoked. He does not have any smokeless tobacco history on file. His alcohol and drug histories are not on file. family history includes Alcohol abuse in his father, maternal grandfather, and maternal uncle; Diabetes in his maternal grandfather, maternal grandmother, and mother; and Heart disease in his mother. No Known Allergies    Review of Systems  Constitutional: Negative for fever, chills, appetite change and unexpected weight change.  Respiratory: Negative for cough and wheezing.   Cardiovascular: Positive for chest pain. Negative for palpitations and leg swelling.  Gastrointestinal: Negative for abdominal  pain.  Neurological: Negative for dizziness and syncope.       Objective:   Physical Exam  Constitutional: He appears well-developed and well-nourished. No distress.  Neck: Neck supple. No thyromegaly present.  Cardiovascular: Normal rate and regular rhythm.  Exam reveals no gallop.   No murmur heard. Pulmonary/Chest: Effort normal and breath sounds normal. No respiratory distress. He has no wheezes. He has no rales. He exhibits no tenderness.  Musculoskeletal: He exhibits no edema.  Lymphadenopathy:    He has no cervical adenopathy.          Assessment & Plan:  Atypical chest pain. Atypical features include duration of 30 seconds and symptoms at rest. No associated exercise induced pains. Patient had cardiac catheterization though this was 7 years ago. His risk factors for coronary disease include family history, low HDL, type 2 diabetes  EKG today shows sinus rhythm with no acute findings We'll set up exercise stress test though pretest probability is fairly low

## 2013-03-18 ENCOUNTER — Ambulatory Visit (INDEPENDENT_AMBULATORY_CARE_PROVIDER_SITE_OTHER): Payer: PRIVATE HEALTH INSURANCE | Admitting: Physician Assistant

## 2013-03-18 DIAGNOSIS — R0789 Other chest pain: Secondary | ICD-10-CM

## 2013-03-18 NOTE — Progress Notes (Signed)
Quick Note:  Pt informed ______ 

## 2013-03-18 NOTE — Procedures (Signed)
Carlos [name] is a 42 y.o. male with new dx of DM2 and significant FHx of CAD is referred for ETT by his PCP.  He has had recent CP at rest with assoc dyspnea.  No exertional symptoms. No syncope.  Non-smoker.  Exam unremarkable.  Baseline ECG unremarkable aside from LAD.   Exercise Treadmill Test  Pre-Exercise Testing Evaluation Rhythm: normal sinus  Rate: 86     Test  Exercise Tolerance Test Ordering MD: Evelena Peat MD  Interpreting MD: Tereso Newcomer PA-C  Unique Test No: 1  Treadmill:  1  Indication for ETT: chest pain - rule out ischemia  Contraindication to ETT: No   Stress Modality: exercise - treadmill  Cardiac Imaging Performed: non   Protocol: standard Bruce - maximal  Max BP:  172/77  Max MPHR (bpm):  180 85% MPR (bpm):  153  MPHR obtained (bpm):  176 % MPHR obtained:  96%  Reached 85% MPHR (min:sec):  9:17 Total Exercise Time (min-sec):  11:00  Workload in METS:  13.4 Borg Scale: 16  Reason ETT Terminated:  patient's desire to stop    ST Segment Analysis At Rest: normal ST segments - no evidence of significant ST depression With Exercise: non-specific ST changes  Other Information Arrhythmia:  No Angina during ETT:  absent (0) Quality of ETT:  diagnostic  ETT Interpretation:  normal - no evidence of ischemia by ST analysis  Comments: Excellent exercise tolerance. No chest pain. Normal BP response to exercise. No significant ST-T changes to suggest ischemia.   Recommendations: F/u with PCP as directed. Signed,  Tereso Newcomer, PA-C  3:12 PM 03/18/2013

## 2013-03-19 ENCOUNTER — Telehealth: Payer: Self-pay | Admitting: *Deleted

## 2013-03-19 NOTE — Telephone Encounter (Signed)
Pt informed and voiced understanding

## 2013-03-19 NOTE — Telephone Encounter (Signed)
Message copied by Melchor Amour on Fri Mar 19, 2013  3:35 PM ------      Message from: Kristian Covey      Created: Thu Mar 18, 2013  3:41 PM        This pt had unremarkable stress test.  Make sure he knows to follow up here for any recurrent chest pain.      ----- Message -----         From: Beatrice Lecher, PA-C         Sent: 03/18/2013   3:15 PM           To: Kristian Covey, MD                   ------

## 2013-03-25 ENCOUNTER — Telehealth: Payer: Self-pay | Admitting: Family Medicine

## 2013-03-25 NOTE — Telephone Encounter (Signed)
We cannot prescribe this medication.  This medication has some very serious risks and can only be prescribed as part of a comprehensive alcohol rehab program with very close monitoring.  We would be happy to help him get in to [facility] or [facility] for ETOH rehab.

## 2013-03-25 NOTE — Telephone Encounter (Signed)
Pt would like  Harriett Sine to return his call concerning naltrexone inj for alcoholic

## 2013-03-25 NOTE — Telephone Encounter (Signed)
I spoke with pt and he is interested in trying this injectable med.   Pt uses [facility] on [address]

## 2013-03-26 NOTE — Telephone Encounter (Signed)
Pt informed

## 2013-04-07 ENCOUNTER — Ambulatory Visit (INDEPENDENT_AMBULATORY_CARE_PROVIDER_SITE_OTHER): Payer: PRIVATE HEALTH INSURANCE | Admitting: Family Medicine

## 2013-04-07 ENCOUNTER — Encounter: Payer: Self-pay | Admitting: Family Medicine

## 2013-04-07 VITALS — BP 130/82 | Temp 98.2°F | Wt 179.0 lb

## 2013-04-07 DIAGNOSIS — F988 Other specified behavioral and emotional disorders with onset usually occurring in childhood and adolescence: Secondary | ICD-10-CM

## 2013-04-07 DIAGNOSIS — E119 Type 2 diabetes mellitus without complications: Secondary | ICD-10-CM

## 2013-04-07 MED ORDER — GLUCOSE BLOOD VI STRP: 1.0000 | ORAL_STRIP | Freq: Two times a day (BID) | Status: DC

## 2013-04-07 MED ORDER — AMPHETAMINE-DEXTROAMPHET ER 20 MG PO CP24: 20.0000 mg | ORAL_CAPSULE | ORAL | Status: DC

## 2013-04-07 NOTE — Progress Notes (Signed)
  Subjective:    Patient ID: Carlos [name], male    DOB: [birth year], 42 y.o.   MRN: [id number]  HPI Patient here to consult regarding possible ADD. Previously diagnosed earlier in life. Recently has seen psychologist who diagnosed with ADD. He had significant job difficulty with multitasking. Frequently misplaces things. Frequently misses details. Low frustration tolerance with recent mistakes at work. His counselor had suggested consideration for ADD medications.  He has history in past of infrequent binge alcohol drinking and he attributes this to frustration from issues above. Denies depression issues. Recent diagnosis type 2 diabetes. Blood sugars have improved with metformin. Schedule followup next month to repeat A1c  Recent exercise tolerance test normal. No recent exertional chest pains.  Past Medical History  Diagnosis Date  . Depression   . Allergy   . Hyperlipidemia   . Alcohol abuse   . Diabetes mellitus without complication 1/14    type 2   Past Surgical History  Procedure Laterality Date  . Mandible fracture surgery  2010    playing baseball    reports that he has never smoked. He does not have any smokeless tobacco history on file. His alcohol and drug histories are not on file. family history includes Alcohol abuse in his father, maternal grandfather, and maternal uncle; Diabetes in his maternal grandfather, maternal grandmother, and mother; and Heart disease in his mother. No Known Allergies    Review of Systems  Constitutional: Negative for fatigue.  Eyes: Negative for visual disturbance.  Respiratory: Negative for cough, chest tightness and shortness of breath.   Cardiovascular: Negative for chest pain, palpitations and leg swelling.  Neurological: Negative for dizziness, syncope, weakness, light-headedness and headaches.       Objective:   Physical Exam  Constitutional: He is oriented to person, place, and time. He appears well-developed and  well-nourished.  Cardiovascular: Normal rate and regular rhythm.  Exam reveals no gallop.   No murmur heard. Pulmonary/Chest: Effort normal and breath sounds normal. No respiratory distress. He has no wheezes. He has no rales.  Musculoskeletal: He exhibits no edema.  Neurological: He is alert and oriented to person, place, and time. No cranial nerve deficit.  Psychiatric: He has a normal mood and affect. His behavior is normal. Judgment and thought content normal.          Assessment & Plan:  #1 attention deficit disorder. Patient will get evaluations from recent psychologist to [place] regarding positive ADD screen. Long discussion regarding possible treatments. He agreed to trial Adderall XR 20 mg once daily. Discussed possible side effects. Reassess one month #2 type 2 diabetes. Replaced home glucose monitor. Reassess A1c at follow up.

## 2013-04-07 NOTE — Patient Instructions (Addendum)
Attention Deficit Hyperactivity Disorder Attention deficit hyperactivity disorder (ADHD) is a problem with behavior issues based on the way the brain functions (neurobehavioral disorder). It is a common reason for behavior and academic problems in school. CAUSES  The cause of ADHD is unknown in most cases. It may run in families. It sometimes can be associated with learning disabilities and other behavioral problems. SYMPTOMS  There are 3 types of ADHD. The 3 types and some of the symptoms include:  Inattentive  Gets bored or distracted easily.  Loses or forgets things. Forgets to hand in homework.  Has trouble organizing or completing tasks.  Difficulty staying on task.  An inability to organize daily tasks and school work.  Leaving projects, chores, or homework unfinished.  Trouble paying attention or responding to details. Careless mistakes.  Difficulty following directions. Often seems like is not listening.  Dislikes activities that require sustained attention (like chores or homework).  Hyperactive-impulsive  Feels like it is impossible to sit still or stay in a seat. Fidgeting with hands and feet.  Trouble waiting turn.  Talking too much or out of turn. Interruptive.  Speaks or acts impulsively.  Aggressive, disruptive behavior.  Constantly busy or on the go, noisy.  Combined  Has symptoms of both of the above. Often children with ADHD feel discouraged about themselves and with school. They often perform well below their abilities in school. These symptoms can cause problems in home, school, and in relationships with peers. As children get older, the excess motor activities can calm down, but the problems with paying attention and staying organized persist. Most children do not outgrow ADHD but with good treatment can learn to cope with the symptoms. DIAGNOSIS  When ADHD is suspected, the diagnosis should be made by professionals trained in ADHD.  Diagnosis will  include:  Ruling out other reasons for the child's behavior.  The caregivers will check with the child's school and check their medical records.  They will talk to teachers and parents.  Behavior rating scales for the child will be filled out by those dealing with the child on a daily basis. A diagnosis is made only after all information has been considered. TREATMENT  Treatment usually includes behavioral treatment often along with medicines. It may include stimulant medicines. The stimulant medicines decrease impulsivity and hyperactivity and increase attention. Other medicines used include antidepressants and certain blood pressure medicines. Most experts agree that treatment for ADHD should address all aspects of the child's functioning. Treatment should not be limited to the use of medicines alone. Treatment should include structured classroom management. The parents must receive education to address rewarding good behavior, discipline, and limit-setting. Tutoring or behavioral therapy or both should be available for the child. If untreated, the disorder can have long-term serious effects into adolescence and adulthood. HOME CARE INSTRUCTIONS   Often with ADHD there is a lot of frustration among the family in dealing with the illness. There is often blame and anger that is not warranted. This is a life long illness. There is no way to prevent ADHD. In many cases, because the problem affects the family as a whole, the entire family may need help. A therapist can help the family find better ways to handle the disruptive behaviors and promote change. If the child is young, most of the therapist's work is with the parents. Parents will learn techniques for coping with and improving their child's behavior. Sometimes only the child with the ADHD needs counseling. Your caregivers can help   you make these decisions.  Children with ADHD may need help in organizing. Some helpful tips include:  Keep  routines the same every day from wake-up time to bedtime. Schedule everything. This includes homework and playtime. This should include outdoor and indoor recreation. Keep the schedule on the refrigerator or a bulletin board where it is frequently seen. Mark schedule changes as far in advance as possible.  Have a place for everything and keep everything in its place. This includes clothing, backpacks, and school supplies.  Encourage writing down assignments and bringing home needed books.  Offer your child a well-balanced diet. Breakfast is especially important for school performance. Children should avoid drinks with caffeine including:  Soft drinks.  Coffee.  Tea.  However, some older children (adolescents) may find these drinks helpful in improving their attention.  Children with ADHD need consistent rules that they can understand and follow. If rules are followed, give small rewards. Children with ADHD often receive, and expect, criticism. Look for good behavior and praise it. Set realistic goals. Give clear instructions. Look for activities that can foster success and self-esteem. Make time for pleasant activities with your child. Give lots of affection.  Parents are their children's greatest advocates. Learn as much as possible about ADHD. This helps you become a stronger and better advocate for your child. It also helps you educate your child's teachers and instructors if they feel inadequate in these areas. Parent support groups are often helpful. A national group with local chapters is called CHADD (Children and Adults with Attention Deficit Hyperactivity Disorder). PROGNOSIS  There is no cure for ADHD. Children with the disorder seldom outgrow it. Many find adaptive ways to accommodate the ADHD as they mature. SEEK MEDICAL CARE IF:  Your child has repeated muscle twitches, cough or speech outbursts.  Your child has sleep problems.  Your child has a marked loss of  appetite.  Your child develops depression.  Your child has new or worsening behavioral problems.  Your child develops dizziness.  Your child has a racing heart.  Your child has stomach pains.  Your child develops headaches. Document Released: 11/22/2002 Document Revised: 02/24/2012 Document Reviewed: 07/04/2008 Exit[employer] Patient Information 2013 [employer].  

## 2013-04-13 ENCOUNTER — Encounter: Payer: Self-pay | Admitting: Family Medicine

## 2013-04-13 ENCOUNTER — Telehealth: Payer: Self-pay | Admitting: Family Medicine

## 2013-04-13 ENCOUNTER — Ambulatory Visit (INDEPENDENT_AMBULATORY_CARE_PROVIDER_SITE_OTHER): Payer: PRIVATE HEALTH INSURANCE | Admitting: Family Medicine

## 2013-04-13 VITALS — BP 120/70 | HR 72 | Temp 98.3°F | Resp 16 | Ht 75.75 in | Wt 180.0 lb

## 2013-04-13 DIAGNOSIS — T148 Other injury of unspecified body region: Secondary | ICD-10-CM

## 2013-04-13 DIAGNOSIS — W57XXXA Bitten or stung by nonvenomous insect and other nonvenomous arthropods, initial encounter: Secondary | ICD-10-CM

## 2013-04-13 MED ORDER — DOXYCYCLINE HYCLATE 100 MG PO CAPS: 100.0000 mg | ORAL_CAPSULE | Freq: Two times a day (BID) | ORAL | Status: AC

## 2013-04-13 NOTE — Telephone Encounter (Signed)
Attempted to return call to pt, message left for call back. °

## 2013-04-13 NOTE — Telephone Encounter (Signed)
Patient Information:  Caller Name: Thaddeus  Phone: [phone]  Patient: Carlos [name], Carlos [name]  Gender: Male  DOB: Aug 28, 1971  Age: 42 Years  PCP: Evelena Peat [facility])  Office Follow Up:  Does the office need to follow up with this patient?: No  Instructions For The Office: N/A   Symptoms  Reason For Call & Symptoms: Patient states onset Saturday evening found a tick embedded on the shaft of penis. Attached >24 hours. He states he has a rash quarter size around the bite area with itching. No drainage. No fever.  Patient is on Meformin for diabetes  Reviewed Health History In EMR: Yes  Reviewed Medications In EMR: Yes  Reviewed Allergies In EMR: Yes  Reviewed Surgeries / Procedures: Yes  Date of Onset of Symptoms: 04/10/2013  Treatments Tried: Alcohol  Treatments Tried Worked: No  Guideline(s) Used:  Tick Bite  Disposition Per Guideline:   See Today in Office  Reason For Disposition Reached:   Probable deer tick that was attached > 24 hours (or tick appears swollen, not flat)  Advice Given:  Antibiotic Ointment:  Wash the wound and your hands with soap and water after removal to prevent catching any tick disease. Apply an over-the-counter antibiotic ointment (e.g., bacitracin) to the bite once.  Expected Course:  Tick bites normally do not itch or hurt. That is why they often go unnoticed.  Call Back If:  You can't remove the tick or the tick's head  Fever or rash occur in the next 2 weeks  Bite begins to look infected  You become worse.  Patient Will Follow Care Advice:  YES  Appointment Scheduled:  04/13/2013 14:00:00 Appointment Scheduled Provider:  Gershon Crane [facility])

## 2013-04-13 NOTE — Progress Notes (Signed)
  Subjective:    Patient ID: Carlos [name], male    DOB: Nov 24, 1971, 42 y.o.   MRN: [id number]  HPI Here for a tick bite on the penis. He discovered the tick embedded in his penis while taking a shower 3 days ago. Then his wife pulled it out with tweezers 2 days ago. Now in the past 24 hours the area has become red and swollen. Not really painful. No fever or rashes or joint pains or HA.    Review of Systems  Constitutional: Negative.   Skin: Positive for wound.  Neurological: Negative.        Objective:   Physical Exam  Constitutional: He is oriented to person, place, and time. He appears well-developed and well-nourished.  Cardiovascular: Normal rate, regular rhythm, normal heart sounds and intact distal pulses.   Pulmonary/Chest: Effort normal and breath sounds normal.  Neurological: He is alert and oriented to person, place, and time.  Skin:  The dorsal penis has a tiny puncture wound surrounded by erythema and mild swelling. Not tender           Assessment & Plan:  Use ice packs to reduce swelling. Cover with Doxycycline. Recheck prn

## 2013-04-22 ENCOUNTER — Other Ambulatory Visit (INDEPENDENT_AMBULATORY_CARE_PROVIDER_SITE_OTHER): Payer: PRIVATE HEALTH INSURANCE

## 2013-04-22 DIAGNOSIS — E119 Type 2 diabetes mellitus without complications: Secondary | ICD-10-CM

## 2013-04-23 NOTE — Progress Notes (Signed)
Quick Note:  Pt informed on mobile VM ______

## 2013-04-26 ENCOUNTER — Other Ambulatory Visit: Payer: PRIVATE HEALTH INSURANCE

## 2013-04-30 ENCOUNTER — Other Ambulatory Visit: Payer: PRIVATE HEALTH INSURANCE

## 2013-05-19 ENCOUNTER — Telehealth: Payer: Self-pay | Admitting: Family Medicine

## 2013-05-19 NOTE — Telephone Encounter (Signed)
Patient Information:  Caller Name: Ad  Phone: [phone]  Patient: Carlos, [name]  Gender: Male  DOB: [birth year]  Age: 42 Years  PCP: Evelena Peat [facility])  Office Follow Up:  Does the office need to follow up with this patient?: Yes  Instructions For The Office: Please f/u regarding elevated triglycerides.  RN Note:  Please call pt back and let him know if he should come in for an assessment. Triglycerides were elevated at 12/2012 well exam but not placed on medicine at that time. Please call pt regarding this issue. Pt also asking what would be the closest [place] to [facility], as his new job is there.  Symptoms  Reason For Call & Symptoms: Pt calling stating he just had blood work done for insurance with new job, and their machine is registering his triglycerides at > 600. Pt is asking what he should do.  Reviewed Health History In EMR: N/A  Reviewed Medications In EMR: N/A  Reviewed Allergies In EMR: N/A  Reviewed Surgeries / Procedures: N/A  Date of Onset of Symptoms: 05/19/2013  Guideline(s) Used:  No Protocol Available - Information Only  Disposition Per Guideline:   Discuss with PCP and Callback by Nurse Today  Reason For Disposition Reached:   Nursing judgment  Advice Given:  N/A  Patient Will Follow Care Advice:  YES

## 2013-05-19 NOTE — Telephone Encounter (Signed)
Start OTC omega 3 supplement 2-3 grams/day.  Repeat lipids fasting in 2 months.  Closest [place] to [place] would probably be [employer]

## 2013-05-20 NOTE — Telephone Encounter (Signed)
Pt aware, lab appt scheduled 

## 2013-06-02 ENCOUNTER — Telehealth: Payer: Self-pay | Admitting: Family Medicine

## 2013-06-02 NOTE — Telephone Encounter (Signed)
Adderall XR 20 filled on 04-07-13, #30 with 0 refills.  Please advise about increase med and tick bite question

## 2013-06-02 NOTE — Telephone Encounter (Signed)
Pt needs refill of: amphetamine-dextroamphetamine (ADDERALL XR) 20 MG 24 hr  1/ day  Pt states he only took this med only one month, but did not feel anything. Pt states you had discussed possible increase.  Also pt was in 4/29 for tick bite. Saw Dr Clent Ridges. Pt concerned because area is constantly itching, and it is very hard like scar tissue.  Just wondering if he should be concerned. Its not worse, just itches. (pt refused triage nurse)

## 2013-06-03 MED ORDER — AMPHETAMINE-DEXTROAMPHET ER 30 MG PO CP24: 30.0000 mg | ORAL_CAPSULE | ORAL | Status: DC

## 2013-06-03 NOTE — Telephone Encounter (Signed)
Pt informed Rx will be ready to pick-up tomorrow. 

## 2013-06-03 NOTE — Telephone Encounter (Signed)
Increase Adderall XR to 30 mg once daily.  Try OTC hydrocortisone cream to tick bite area.  Office f/u if no better 1-2 weeks.

## 2013-07-20 ENCOUNTER — Other Ambulatory Visit: Payer: PRIVATE HEALTH INSURANCE

## 2013-08-11 ENCOUNTER — Other Ambulatory Visit: Payer: Self-pay | Admitting: Family Medicine

## 2013-09-02 ENCOUNTER — Telehealth: Payer: Self-pay | Admitting: Family Medicine

## 2013-09-02 NOTE — Addendum Note (Signed)
Addended by: Thomasena Edis on: 09/02/2013 04:19 PM   Modules accepted: Orders

## 2013-09-02 NOTE — Telephone Encounter (Signed)
PT INFORMED

## 2013-09-02 NOTE — Telephone Encounter (Signed)
Pt returning your call. pls call oh his cell phone

## 2013-09-02 NOTE — Telephone Encounter (Signed)
Call back attempted at 9am on 9-18, vmail left.

## 2013-09-02 NOTE — Telephone Encounter (Signed)
Called and left message on patient VM. I never called the patient i see that triage called him back and he didn't answer for them.

## 2013-09-02 NOTE — Telephone Encounter (Addendum)
Pt called to get increase on his metFORMIN (GLUCOPHAGE) 500 MG tablet Pt states his sugar has gone up in the mornings 25+ pts higher than it has been. Running 150-160 fasting. Pt does not have insurance at the present and cannot come. Will insurance have [facility]

## 2013-09-02 NOTE — Telephone Encounter (Signed)
Increase metformin 500 mg to two po BID (this is the top dose for this med)

## 2013-09-28 ENCOUNTER — Telehealth: Payer: Self-pay | Admitting: Family Medicine

## 2013-09-28 NOTE — Telephone Encounter (Signed)
Pt would like to know if you would rx antabuse. Pt refused to schedule an appt.pt does not have insurance until dec. Pt states he has discussed w/ md before. Pharm: [facility] [place]

## 2013-09-28 NOTE — Telephone Encounter (Signed)
Pt stated that you and him talked about this medication in the past he was last seen 04/07/13

## 2013-09-29 NOTE — Telephone Encounter (Signed)
This drug requires some blood monitoring and discussion of risks so without being seen, we cannot do the appropriate monitoring.

## 2013-09-29 NOTE — Telephone Encounter (Signed)
Left detailed message on patient VM per Dr. Caryl Never

## 2014-01-19 ENCOUNTER — Encounter: Payer: Self-pay | Admitting: *Deleted

## 2014-01-20 ENCOUNTER — Ambulatory Visit: Payer: PRIVATE HEALTH INSURANCE | Admitting: Family Medicine

## 2014-01-20 ENCOUNTER — Telehealth: Payer: Self-pay | Admitting: Family Medicine

## 2014-01-20 DIAGNOSIS — E119 Type 2 diabetes mellitus without complications: Secondary | ICD-10-CM

## 2014-01-20 DIAGNOSIS — E785 Hyperlipidemia, unspecified: Secondary | ICD-10-CM

## 2014-01-20 NOTE — Telephone Encounter (Signed)
Can you please call the patient and schedule for lab appt. Order is placed

## 2014-01-20 NOTE — Telephone Encounter (Signed)
lmom for pt to CB

## 2014-01-20 NOTE — Telephone Encounter (Signed)
Pt would like to know if he could have cpx labs next tues at his appt. This is a diabetic ckup appt, pt asked  To have those labs prior to appt, Advised pt he may need to see MD first .

## 2014-01-20 NOTE — Telephone Encounter (Signed)
Ok to get CPE labs.

## 2014-01-25 ENCOUNTER — Encounter: Payer: Self-pay | Admitting: Family Medicine

## 2014-01-25 ENCOUNTER — Ambulatory Visit (INDEPENDENT_AMBULATORY_CARE_PROVIDER_SITE_OTHER): Payer: BC Managed Care – PPO | Admitting: Family Medicine

## 2014-01-25 VITALS — BP 110/70 | HR 74 | Temp 98.0°F | Wt 176.0 lb

## 2014-01-25 DIAGNOSIS — F988 Other specified behavioral and emotional disorders with onset usually occurring in childhood and adolescence: Secondary | ICD-10-CM

## 2014-01-25 DIAGNOSIS — L988 Other specified disorders of the skin and subcutaneous tissue: Secondary | ICD-10-CM

## 2014-01-25 DIAGNOSIS — E785 Hyperlipidemia, unspecified: Secondary | ICD-10-CM

## 2014-01-25 DIAGNOSIS — M436 Torticollis: Secondary | ICD-10-CM

## 2014-01-25 DIAGNOSIS — R238 Other skin changes: Secondary | ICD-10-CM

## 2014-01-25 DIAGNOSIS — E119 Type 2 diabetes mellitus without complications: Secondary | ICD-10-CM

## 2014-01-25 LAB — LIPID PANEL
CHOLESTEROL: 161 mg/dL (ref 0–200)
HDL: 41.9 mg/dL (ref >39.00)
TRIGLYCERIDES: 201 mg/dL — AB (ref 0.0–149.0)
Total CHOL/HDL Ratio: 4
VLDL: 40.2 mg/dL — AB (ref 0.0–40.0)

## 2014-01-25 LAB — MICROALBUMIN / CREATININE URINE RATIO
CREATININE, U: 222.3 mg/dL
MICROALB/CREAT RATIO: 2.6 mg/g (ref 0.0–30.0)
Microalb, Ur: 5.7 mg/dL — ABNORMAL HIGH (ref 0.0–1.9)

## 2014-01-25 LAB — HM DIABETES EYE EXAM

## 2014-01-25 LAB — HM DIABETES FOOT EXAM: HM DIABETIC FOOT EXAM: NORMAL

## 2014-01-25 LAB — LDL CHOLESTEROL, DIRECT: LDL DIRECT: 84 mg/dL

## 2014-01-25 LAB — HEMOGLOBIN A1C: Hgb A1c MFr Bld: 7.8 % — ABNORMAL HIGH (ref 4.6–6.5)

## 2014-01-25 MED ORDER — ONETOUCH DELICA LANCETS FINE MISC: Status: DC

## 2014-01-25 MED ORDER — AMPHETAMINE-DEXTROAMPHETAMINE 20 MG PO TABS: 20.0000 mg | ORAL_TABLET | Freq: Every day | ORAL | Status: DC

## 2014-01-25 MED ORDER — DOXYCYCLINE HYCLATE 100 MG PO CAPS: 100.0000 mg | ORAL_CAPSULE | Freq: Two times a day (BID) | ORAL | Status: DC

## 2014-01-25 MED ORDER — GLUCOSE BLOOD VI STRP: 1.0000 | ORAL_STRIP | Freq: Two times a day (BID) | Status: DC

## 2014-01-25 NOTE — Addendum Note (Signed)
Addended by: Eulas Post on: 01/25/2014 09:26 AM   Modules accepted: Orders, Level of Service

## 2014-01-25 NOTE — Addendum Note (Signed)
Addended by: Eulas Post on: 01/25/2014 09:22 AM   Modules accepted: Level of Service

## 2014-01-25 NOTE — Progress Notes (Deleted)
   Subjective:    Patient ID: Carlos [name], male    DOB: [birth year], 43 y.o.   MRN: [id number]  Diabetes Pertinent negatives for diabetes include no chest pain and no weakness.   Acute visit Patient seen with torticollis. Onset this morning. No injury. Denies any fever. No sore throat. No history of similar problem in the past. He has been to the right side. Denies any skin rashes. Denies any headache. He tried some heat with mild relief and also took a couple of Aleve which helped his symptoms slightly. Had some minimal tingling of the left hand but no weakness. No radiculopathy symptoms.  Past Medical History  Diagnosis Date  . Depression   . Allergy   . Hyperlipidemia   . Alcohol abuse   . Diabetes mellitus without complication 6/44    type 2   Past Surgical History  Procedure Laterality Date  . Mandible fracture surgery  2010    playing baseball    reports that he has never smoked. He does not have any smokeless tobacco history on file. His alcohol and drug histories are not on file. family history includes Alcohol abuse in his father, maternal grandfather, and maternal uncle; Diabetes in his maternal grandfather, maternal grandmother, and mother; Heart disease in his mother. No Known Allergies    Review of Systems  Constitutional: Negative for fever and chills.  HENT: Negative for sore throat, trouble swallowing and voice change.   Respiratory: Negative for cough and shortness of breath.   Cardiovascular: Negative for chest pain.  Neurological: Negative for weakness.  Hematological: Negative for adenopathy.       Objective:   Physical Exam  Constitutional: He appears well-developed.  HENT:  Right Ear: External ear normal.  Left Ear: External ear normal.  Mouth/Throat: Oropharynx is clear and moist.  Neck: Neck supple.  Patient has torticollis with head tilted to the right side. He has tenderness over the left paracervical and left trapezius muscles    Cardiovascular: Normal rate.   Pulmonary/Chest: Effort normal and breath sounds normal. No respiratory distress. He has no wheezes. He has no rales.  Neurological:   Full-strength upper extremities. Symmetric reflexes.          Assessment & Plan:  Torticollis. No evidence for infectious origin. Continue moist heat. Continue Aleve. Add Flexeril 10 mg every 8 hours with caution for sedation. Try topical rubs. Consider physical therapy if not improving over the next couple days

## 2014-01-25 NOTE — Progress Notes (Signed)
   Subjective:    Patient ID: Carlos [name], male    DOB: 13-Mar-1971, 43 y.o.   MRN: [id number]  Diabetes Pertinent negatives for hypoglycemia include no dizziness or headaches. Pertinent negatives for diabetes include no chest pain, no fatigue and no weakness.   Patient is here for followup type 2 diabetes History of poor compliance with followup. Takes metformin 500 mg twice daily. Recent fasting blood sugars run 130. Does not check blood sugars consistently. No symptoms of hyperglycemia. He was just diagnosed with diabetes last year. Also history of dyslipidemia with elevated triglycerides but these were check on his blood sugars were poorly controlled. He is no history of hypertension. Nonsmoker.  Past history of excessive alcohol use but he is brought under control finally. He has history of ADD. His tried extended-release Adderall but did not see great improvements. Like to consider shorter acting Adderall.  Recent ingrown hair left side of neck. This is been this swollen painful papule for several weeks. No pustules. Occasional drainage. No history of MRSA.  Past Medical History  Diagnosis Date  . Depression   . Allergy   . Hyperlipidemia   . Alcohol abuse   . Diabetes mellitus without complication 4/85    type 2   Past Surgical History  Procedure Laterality Date  . Mandible fracture surgery  2010    playing baseball    reports that he has never smoked. He does not have any smokeless tobacco history on file. His alcohol and drug histories are not on file. family history includes Alcohol abuse in his father, maternal grandfather, and maternal uncle; Diabetes in his maternal grandfather, maternal grandmother, and mother; Heart disease in his mother. No Known Allergies    Review of Systems  Constitutional: Negative for fatigue.  Eyes: Negative for visual disturbance.  Respiratory: Negative for cough, chest tightness and shortness of breath.   Cardiovascular: Negative for  chest pain, palpitations and leg swelling.  Neurological: Negative for dizziness, syncope, weakness, light-headedness and headaches.       Objective:   Physical Exam  Constitutional: He appears well-developed and well-nourished.  Neck: Neck supple. No thyromegaly present.  Cardiovascular: Normal rate and regular rhythm.   No murmur heard. Pulmonary/Chest: Effort normal and breath sounds normal. No respiratory distress. He has no wheezes. He has no rales.  Musculoskeletal: He exhibits no edema.  Skin:  No foot lesions. Normal sensory function. Normal pulses. No calluses.  Small nonspecific erythematous papules left side of neck. Indurated. No pustular Center.          Assessment & Plan:  #1 type 2 diabetes. Good control home readings. Recheck A1c. Check urine microalbumin #2 dyslipidemia. Repeat lipid panel #3 ADD. Change to Adderall 20 mg one twice daily #4 papule left side of neck. Probable ingrown hair. Doxycycline 100 mg twice daily for 10 days. Warm compresses.

## 2014-01-25 NOTE — Progress Notes (Signed)
Pre visit review using our clinic review tool, if applicable. No additional management support is needed unless otherwise documented below in the visit note. 

## 2014-01-25 NOTE — Addendum Note (Signed)
Addended by: Marcina Millard on: 01/25/2014 01:03 PM   Modules accepted: Orders

## 2014-01-25 NOTE — Progress Notes (Deleted)
   Subjective:    Patient ID: Carlos [name], male    DOB: September 17, 1971, 43 y.o.   MRN: [id number]  Diabetes Pertinent negatives for diabetes include no chest pain and no weakness.   Acute visit Patient seen with torticollis. Onset this morning. No injury. Denies any fever. No sore throat. No history of similar problem in the past. He has been to the right side. Denies any skin rashes. Denies any headache. He tried some heat with mild relief and also took a couple of Aleve which helped his symptoms slightly. Had some minimal tingling of the left hand but no weakness. No radiculopathy symptoms.  Past Medical History  Diagnosis Date  . Depression   . Allergy   . Hyperlipidemia   . Alcohol abuse   . Diabetes mellitus without complication 8/28    type 2   Past Surgical History  Procedure Laterality Date  . Mandible fracture surgery  2010    playing baseball    reports that he has never smoked. He does not have any smokeless tobacco history on file. His alcohol and drug histories are not on file. family history includes Alcohol abuse in his father, maternal grandfather, and maternal uncle; Diabetes in his maternal grandfather, maternal grandmother, and mother; Heart disease in his mother. No Known Allergies    Review of Systems  Constitutional: Negative for fever and chills.  HENT: Negative for sore throat, trouble swallowing and voice change.   Respiratory: Negative for cough and shortness of breath.   Cardiovascular: Negative for chest pain.  Neurological: Negative for weakness.  Hematological: Negative for adenopathy.       Objective:   Physical Exam  Constitutional: He appears well-developed.  HENT:  Right Ear: External ear normal.  Left Ear: External ear normal.  Mouth/Throat: Oropharynx is clear and moist.  Neck: Neck supple.  Patient has torticollis with head tilted to the right side. He has tenderness over the left paracervical and left trapezius muscles    Cardiovascular: Normal rate.   Pulmonary/Chest: Effort normal and breath sounds normal. No respiratory distress. He has no wheezes. He has no rales.  Neurological:   Full-strength upper extremities. Symmetric reflexes.          Assessment & Plan:  Torticollis. No evidence for infectious origin. Continue moist heat. Continue Aleve. Add Flexeril 10 mg every 8 hours with caution for sedation. Try topical rubs. Consider physical therapy if not improving over the next couple days

## 2014-01-26 ENCOUNTER — Other Ambulatory Visit: Payer: Self-pay

## 2014-01-26 ENCOUNTER — Telehealth: Payer: Self-pay

## 2014-01-26 DIAGNOSIS — E119 Type 2 diabetes mellitus without complications: Secondary | ICD-10-CM

## 2014-01-26 MED ORDER — METFORMIN HCL 500 MG PO TABS: ORAL_TABLET | ORAL | Status: DC

## 2014-01-26 NOTE — Telephone Encounter (Signed)
Relevant patient education mailed to patient.  

## 2014-01-27 ENCOUNTER — Other Ambulatory Visit: Payer: Self-pay

## 2014-01-27 MED ORDER — ONETOUCH DELICA LANCETS FINE MISC: Status: DC

## 2014-01-31 ENCOUNTER — Telehealth: Payer: Self-pay | Admitting: Family Medicine

## 2014-01-31 NOTE — Telephone Encounter (Signed)
Pt returning your call for lab results

## 2014-01-31 NOTE — Telephone Encounter (Signed)
Pt informed

## 2014-02-21 ENCOUNTER — Telehealth: Payer: Self-pay | Admitting: Family Medicine

## 2014-02-21 MED ORDER — AMPHETAMINE-DEXTROAMPHETAMINE 20 MG PO TABS: 20.0000 mg | ORAL_TABLET | Freq: Every day | ORAL | Status: DC

## 2014-02-21 NOTE — Telephone Encounter (Signed)
RX is ready for pickup left message on Pt VM

## 2014-02-21 NOTE — Telephone Encounter (Signed)
Pt is requesting refill of amphetamine-dextroamphetamine (ADDERALL) 20 MG tablet, pt is completely out. Please call when ready for pick up.

## 2014-02-21 NOTE — Telephone Encounter (Signed)
Last seen 01/25/14 Last visit 01/25/14 # 60 0 refil;

## 2014-02-22 ENCOUNTER — Telehealth: Payer: Self-pay | Admitting: Family Medicine

## 2014-02-22 NOTE — Telephone Encounter (Addendum)
Pt stopped by today b/c pharm would not refill his adderall rx until 3/23  Pt states dr b increased his rx adderall and now he is out.  The rx he picked up was for 1 X day. pls advise Pt states best number is his work cell to speak w/ him

## 2014-02-23 MED ORDER — AMPHETAMINE-DEXTROAMPHETAMINE 20 MG PO TABS: 20.0000 mg | ORAL_TABLET | Freq: Two times a day (BID) | ORAL | Status: DC

## 2014-02-23 NOTE — Telephone Encounter (Signed)
Pt is going to come back to the office to switch RXs.

## 2014-03-25 ENCOUNTER — Telehealth: Payer: Self-pay | Admitting: Family Medicine

## 2014-03-25 NOTE — Telephone Encounter (Signed)
Last visit 01/25/14 Last refill 02/23/14 #60 0 refill

## 2014-03-25 NOTE — Telephone Encounter (Signed)
Pt request refill amphetamine-dextroamphetamine (ADDERALL) 20 MG tablet  1/BID

## 2014-03-27 NOTE — Telephone Encounter (Signed)
Refill OK

## 2014-03-28 MED ORDER — AMPHETAMINE-DEXTROAMPHETAMINE 20 MG PO TABS: 20.0000 mg | ORAL_TABLET | Freq: Two times a day (BID) | ORAL | Status: DC

## 2014-03-28 NOTE — Telephone Encounter (Signed)
Left message on VM that Rx is ready for pick up

## 2014-04-29 ENCOUNTER — Telehealth: Payer: Self-pay | Admitting: Family Medicine

## 2014-04-29 MED ORDER — AMPHETAMINE-DEXTROAMPHETAMINE 20 MG PO TABS: 20.0000 mg | ORAL_TABLET | Freq: Two times a day (BID) | ORAL | Status: DC

## 2014-04-29 NOTE — Telephone Encounter (Signed)
Last visit 01/25/14 Last refill 03/28/14 #60 0 refill

## 2014-04-29 NOTE — Telephone Encounter (Signed)
Left detailed message Rx ready for pickup. Rx's printed and signed.

## 2014-04-29 NOTE — Telephone Encounter (Signed)
Refill OK and we can do 3 month refills.

## 2014-04-29 NOTE — Telephone Encounter (Signed)
Pt needs new rx generic adderall 20 mg

## 2014-05-01 ENCOUNTER — Emergency Department (HOSPITAL_COMMUNITY): Payer: BC Managed Care – PPO

## 2014-05-01 ENCOUNTER — Encounter (HOSPITAL_COMMUNITY): Payer: Self-pay | Admitting: Emergency Medicine

## 2014-05-01 ENCOUNTER — Emergency Department (HOSPITAL_COMMUNITY)
Admission: EM | Admit: 2014-05-01 | Discharge: 2014-05-02 | Disposition: A | Discharge status: Home / Self Care | Payer: BC Managed Care – PPO | Attending: Emergency Medicine | Admitting: Emergency Medicine

## 2014-05-01 DIAGNOSIS — N200 Calculus of kidney: Secondary | ICD-10-CM

## 2014-05-01 DIAGNOSIS — Z79899 Other long term (current) drug therapy: Secondary | ICD-10-CM | POA: Insufficient documentation

## 2014-05-01 DIAGNOSIS — R300 Dysuria: Secondary | ICD-10-CM | POA: Insufficient documentation

## 2014-05-01 DIAGNOSIS — R110 Nausea: Secondary | ICD-10-CM | POA: Insufficient documentation

## 2014-05-01 DIAGNOSIS — R319 Hematuria, unspecified: Secondary | ICD-10-CM

## 2014-05-01 DIAGNOSIS — R1012 Left upper quadrant pain: Secondary | ICD-10-CM | POA: Insufficient documentation

## 2014-05-01 DIAGNOSIS — Z8659 Personal history of other mental and behavioral disorders: Secondary | ICD-10-CM | POA: Insufficient documentation

## 2014-05-01 DIAGNOSIS — E119 Type 2 diabetes mellitus without complications: Secondary | ICD-10-CM | POA: Insufficient documentation

## 2014-05-01 LAB — URINALYSIS, ROUTINE W REFLEX MICROSCOPIC
Glucose, UA: NEGATIVE mg/dL
Ketones, ur: 15 mg/dL — AB
NITRITE: NEGATIVE
Protein, ur: 30 mg/dL — AB
Specific Gravity, Urine: 1.023 (ref 1.005–1.030)
UROBILINOGEN UA: 0.2 mg/dL (ref 0.0–1.0)
pH: 5.5 (ref 5.0–8.0)

## 2014-05-01 LAB — CBC WITH DIFFERENTIAL/PLATELET
Basophils Absolute: 0 10*3/uL (ref 0.0–0.1)
Basophils Relative: 0 % (ref 0–1)
EOS PCT: 3 % (ref 0–5)
Eosinophils Absolute: 0.2 10*3/uL (ref 0.0–0.7)
HCT: 46 % (ref 39.0–52.0)
Hemoglobin: 16.2 g/dL (ref 13.0–17.0)
LYMPHS ABS: 2.5 10*3/uL (ref 0.7–4.0)
LYMPHS PCT: 30 % (ref 12–46)
MCH: 33.2 pg (ref 26.0–34.0)
MCHC: 35.2 g/dL (ref 30.0–36.0)
MCV: 94.3 fL (ref 78.0–100.0)
MONOS PCT: 7 % (ref 3–12)
Monocytes Absolute: 0.6 10*3/uL (ref 0.1–1.0)
Neutro Abs: 5 10*3/uL (ref 1.7–7.7)
Neutrophils Relative %: 60 % (ref 43–77)
PLATELETS: 232 10*3/uL (ref 150–400)
RBC: 4.88 MIL/uL (ref 4.22–5.81)
RDW: 12.3 % (ref 11.5–15.5)
WBC: 8.3 10*3/uL (ref 4.0–10.5)

## 2014-05-01 LAB — COMPREHENSIVE METABOLIC PANEL
ALK PHOS: 67 U/L (ref 39–117)
ALT: 49 U/L (ref 0–53)
AST: 53 U/L — ABNORMAL HIGH (ref 0–37)
Albumin: 4.4 g/dL (ref 3.5–5.2)
BUN: 17 mg/dL (ref 6–23)
CALCIUM: 10.4 mg/dL (ref 8.4–10.5)
CO2: 28 meq/L (ref 19–32)
Chloride: 92 mEq/L — ABNORMAL LOW (ref 96–112)
Creatinine, Ser: 1.01 mg/dL (ref 0.50–1.35)
GFR calc non Af Amer: >90 mL/min (ref >90)
GLUCOSE: 190 mg/dL — AB (ref 70–99)
POTASSIUM: 4.4 meq/L (ref 3.7–5.3)
SODIUM: 137 meq/L (ref 137–147)
TOTAL PROTEIN: 8.1 g/dL (ref 6.0–8.3)
Total Bilirubin: 0.6 mg/dL (ref 0.3–1.2)

## 2014-05-01 LAB — URINE MICROSCOPIC-ADD ON

## 2014-05-01 LAB — LIPASE, BLOOD: LIPASE: 27 U/L (ref 11–59)

## 2014-05-01 IMAGING — CT CT ABD-PELV W/O CM
2 of 4 series · 15 of 46 positions shown, 17 images · non-contrast
Comparison: None.

CLINICAL DATA: FLANK PAIN DYSURIA

EXAM:
CT ABDOMEN AND PELVIS WITHOUT CONTRAST
TECHNIQUE: Multidetector CT imaging of the abdomen and pelvis was performed
following the standard protocol without IV contrast.

[Series 2: abd/ pelvis 5.0 i30f 1 · axial · 0.67mm/px · z∈[-1103,-638]mm · 12 of 107 slices shown, 14 images]
[im 9/107  soft-tissue]
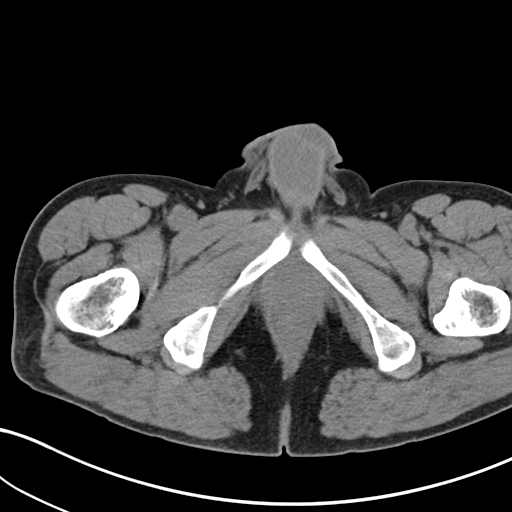
[im 9/107  bone]
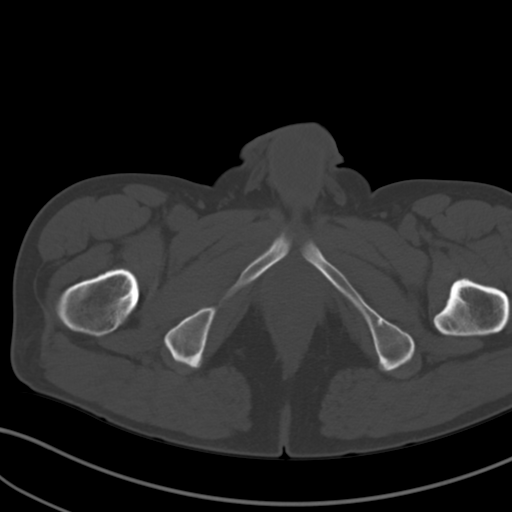
[im 17/107  soft-tissue]
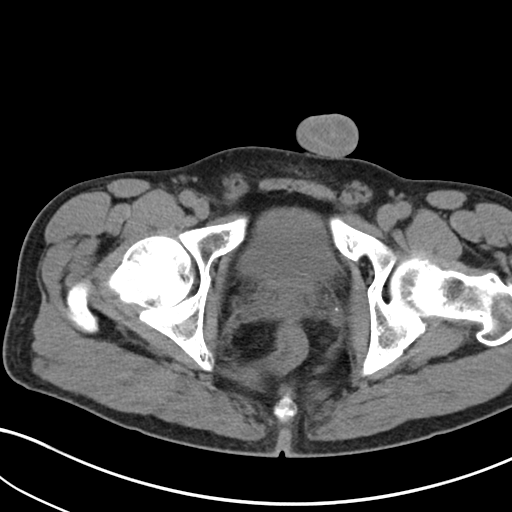
[im 26/107  soft-tissue]
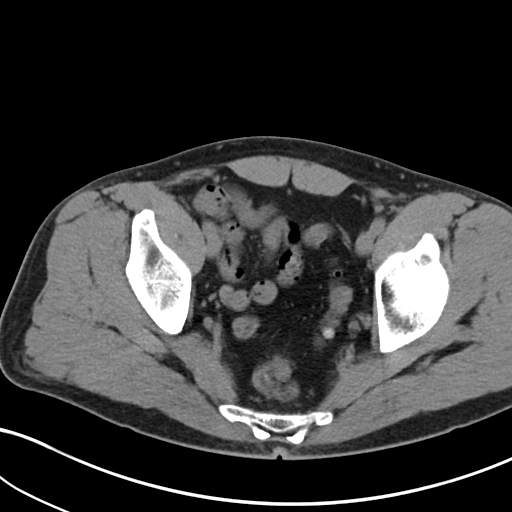
[im 34/107  soft-tissue]
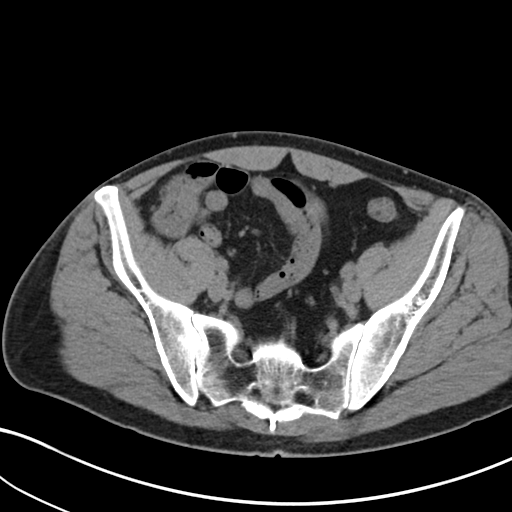
[im 43/107  soft-tissue]
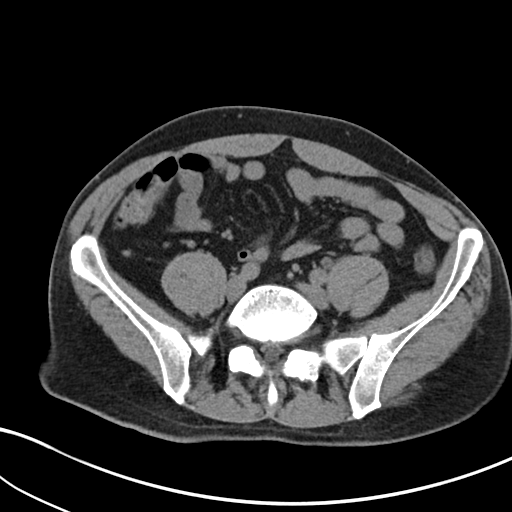
[im 51/107  soft-tissue]
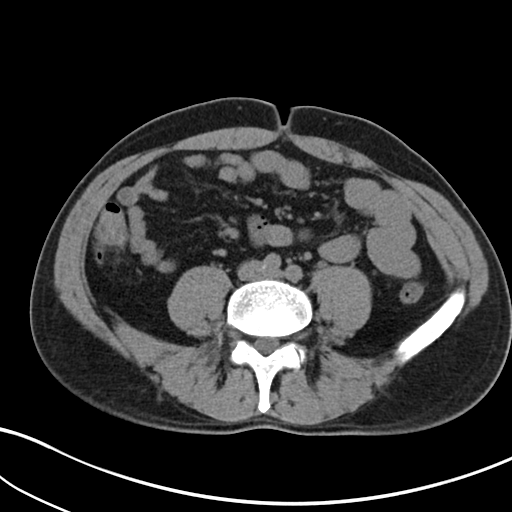
[im 60/107  soft-tissue]
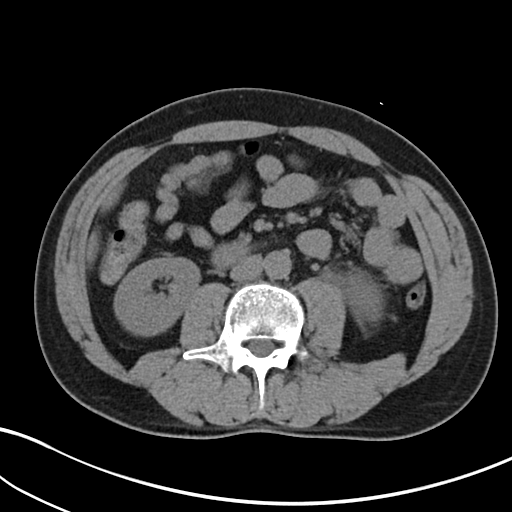
[im 68/107  soft-tissue]
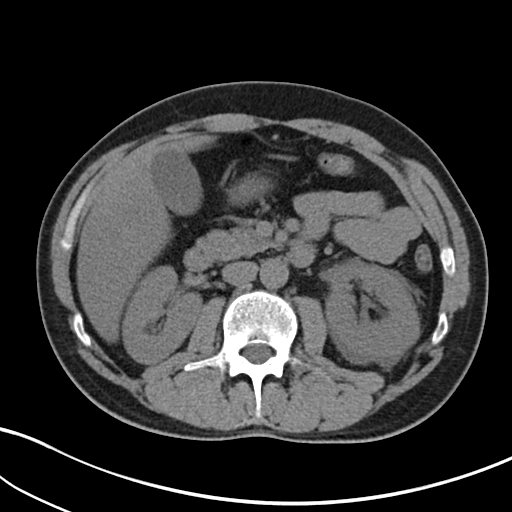
[im 77/107  soft-tissue]
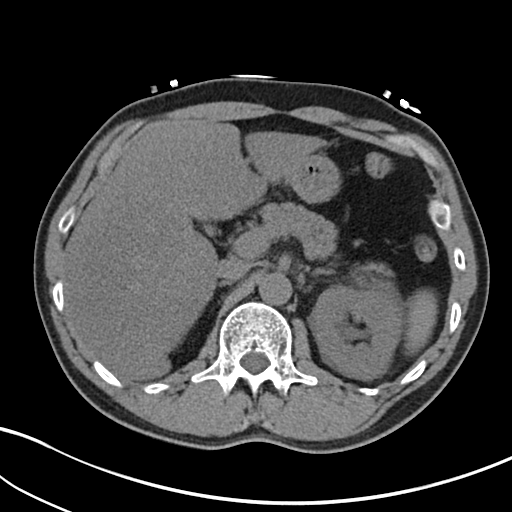
[im 77/107  bone]
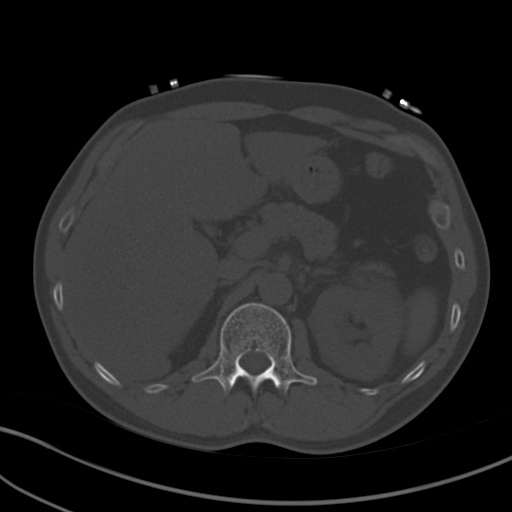
[im 85/107  soft-tissue]
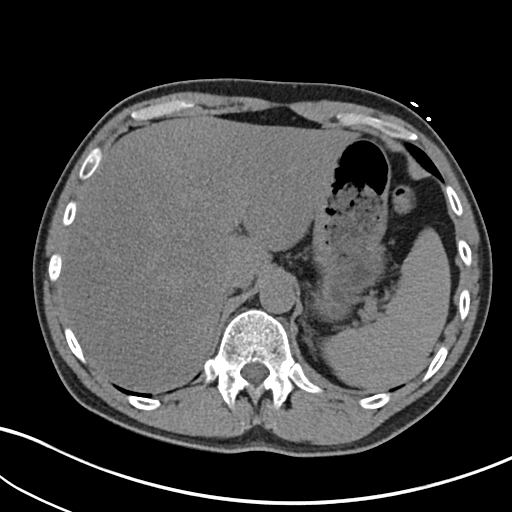
[im 94/107  soft-tissue]
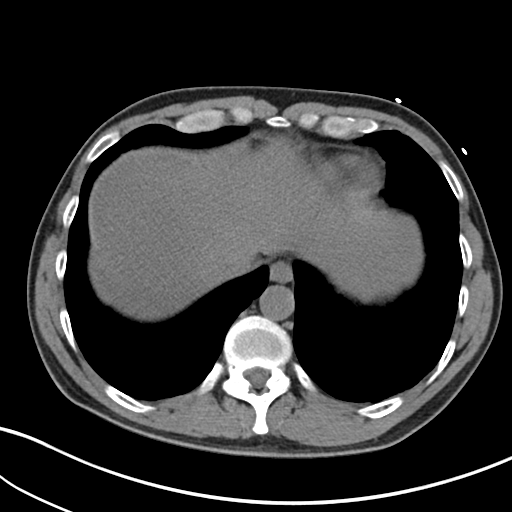
[im 102/107  soft-tissue]
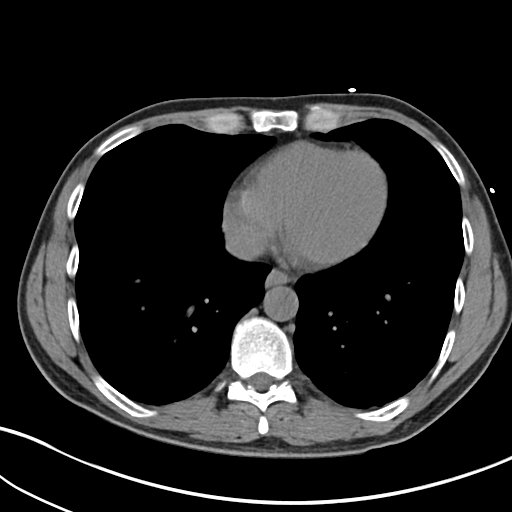

[Series 5: cor st · coronal · 0.67mm/px · 3 of 71 slices shown]
[im 24/71  soft-tissue]
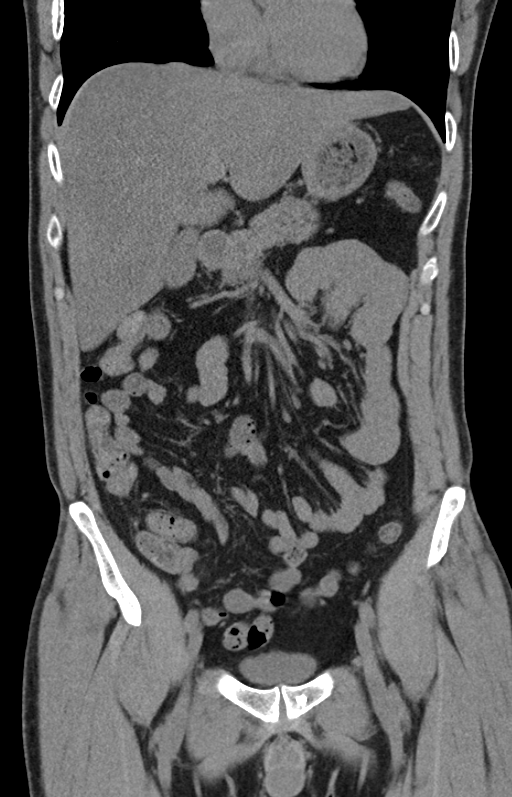
[im 32/71  soft-tissue]
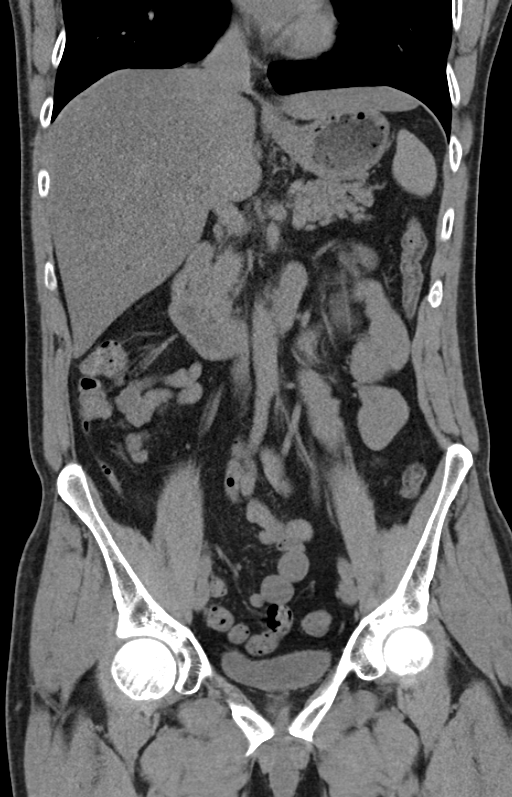
[im 39/71  soft-tissue]
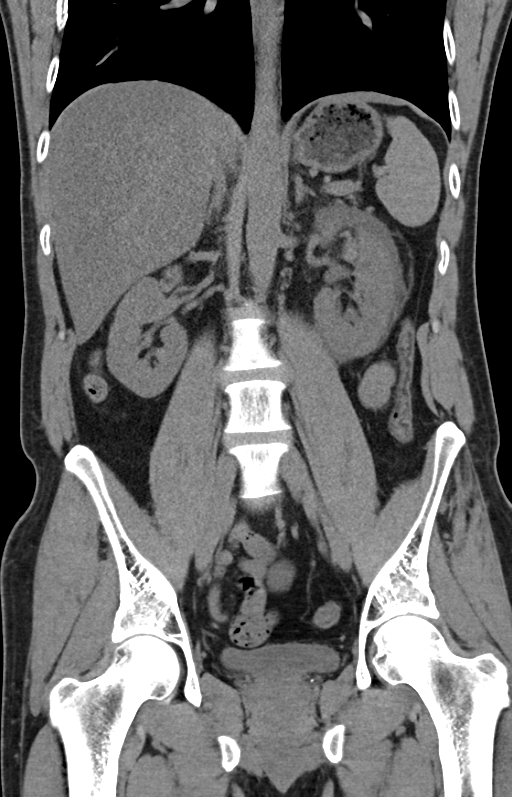

[15 of 46 positions shown; findings below may reference images not displayed]

FINDINGS: The lung bases are unremarkable.

Noncontrast evaluation of the liver demonstrates diffuse low
attenuating architecture, with areas of sparing adjacent to the
gallbladder fossa. Gallbladder fossa is unremarkable. The spleen,
adrenals, pancreas are unremarkable.

Minimal 2 Mild hydronephrosis within the left kidney and mild
hydroureter secondary to a 4 mm and 3 mm calculus in the distal
aspect of the vesicoureteral junction on the left. Nonobstructing
medullary calculi in the midpole left kidney measuring 3 mm and 5
mm. Small amount of free fluid is appreciated within the perinephric
fat and mild inflammatory change. The left kidney is edematous.

The right kidney is unremarkable.

Otherwise no evidence of abdominal or pelvic free fluid, loculated
fluid collections, masses, nor adenopathy. There is no evidence of
abdominal aortic aneurysm.

No abdominal wall or inguinal hernia appreciated.

The bowel is negative.  The appendix is identified and unremarkable.

There no aggressive appearing osseous lesions.
IMPRESSION: Distal left ureteral calculi projecting in the distal aspect of the
ureteral vesicle junction with associated minimal to mild
obstructive uropathy there also findings which may reflect
pyelonephritis and correlation with urinalysis recommended.
Nonobstructing medullary calculi within the left kidney.

Hepatic steatosis

## 2014-05-01 MED ORDER — HYDROMORPHONE HCL PF 1 MG/ML IJ SOLN
1.0000 mg | Freq: Once | INTRAMUSCULAR | Status: AC
  Administered 2014-05-01: 1 mg via INTRAVENOUS
  Filled 2014-05-01: qty 1

## 2014-05-01 MED ORDER — ONDANSETRON HCL 4 MG/2ML IJ SOLN
4.0000 mg | Freq: Once | INTRAMUSCULAR | Status: AC
  Administered 2014-05-01: 4 mg via INTRAVENOUS

## 2014-05-01 MED ORDER — ONDANSETRON HCL 4 MG/2ML IJ SOLN
4.0000 mg | Freq: Once | INTRAMUSCULAR | Status: AC
  Administered 2014-05-01: 4 mg via INTRAVENOUS
  Filled 2014-05-01: qty 2

## 2014-05-01 MED ORDER — TAMSULOSIN HCL 0.4 MG PO CAPS: 0.4000 mg | ORAL_CAPSULE | Freq: Every day | ORAL | Status: DC

## 2014-05-01 MED ORDER — SODIUM CHLORIDE 0.9 % IV BOLUS (SEPSIS)
1000.0000 mL | Freq: Once | INTRAVENOUS | Status: AC
  Administered 2014-05-01: 1000 mL via INTRAVENOUS

## 2014-05-01 MED ORDER — OXYCODONE-ACETAMINOPHEN 5-325 MG PO TABS: 1.0000 | ORAL_TABLET | ORAL | Status: DC | PRN

## 2014-05-01 MED ORDER — OXYCODONE-ACETAMINOPHEN 5-325 MG PO TABS
1.0000 | ORAL_TABLET | Freq: Once | ORAL | Status: AC
  Administered 2014-05-01: 1 via ORAL
  Filled 2014-05-01: qty 1

## 2014-05-01 MED ORDER — ONDANSETRON HCL 4 MG PO TABS: 4.0000 mg | ORAL_TABLET | Freq: Four times a day (QID) | ORAL | Status: DC

## 2014-05-01 NOTE — Discharge Instructions (Signed)
Kidney Stones Kidney stones (urolithiasis) are deposits that form inside your kidneys. The intense pain is caused by the stone moving through the urinary tract. When the stone moves, the ureter goes into spasm around the stone. The stone is usually passed in the urine.  CAUSES   A disorder that makes certain neck glands produce too much parathyroid hormone (primary hyperparathyroidism).  A buildup of uric acid crystals, similar to gout in your joints.  Narrowing (stricture) of the ureter.  A kidney obstruction present at birth (congenital obstruction).  Previous surgery on the kidney or ureters.  Numerous kidney infections. SYMPTOMS   Feeling sick to your stomach (nauseous).  Throwing up (vomiting).  Blood in the urine (hematuria).  Pain that usually spreads (radiates) to the groin.  Frequency or urgency of urination. DIAGNOSIS   Taking a history and physical exam.  Blood or urine tests.  CT scan.  Occasionally, an examination of the inside of the urinary bladder (cystoscopy) is performed. TREATMENT   Observation.  Increasing your fluid intake.  Extracorporeal shock wave lithotripsy This is a noninvasive procedure that uses shock waves to break up kidney stones.  Surgery may be needed if you have severe pain or persistent obstruction. There are various surgical procedures. Most of the procedures are performed with the use of small instruments. Only small incisions are needed to accommodate these instruments, so recovery time is minimized. The size, location, and chemical composition are all important variables that will determine the proper choice of action for you. Talk to your health care provider to better understand your situation so that you will minimize the risk of injury to yourself and your kidney.  HOME CARE INSTRUCTIONS   Drink enough water and fluids to keep your urine clear or pale yellow. This will help you to pass the stone or stone fragments.  Strain  all urine through the provided strainer. Keep all particulate matter and stones for your health care provider to see. The stone causing the pain may be as small as a grain of salt. It is very important to use the strainer each and every time you pass your urine. The collection of your stone will allow your health care provider to analyze it and verify that a stone has actually passed. The stone analysis will often identify what you can do to reduce the incidence of recurrences.  Only take over-the-counter or prescription medicines for pain, discomfort, or fever as directed by your health care provider.  Make a follow-up appointment with your health care provider as directed.  Get follow-up X-rays if required. The absence of pain does not always mean that the stone has passed. It may have only stopped moving. If the urine remains completely obstructed, it can cause loss of kidney function or even complete destruction of the kidney. It is your responsibility to make sure X-rays and follow-ups are completed. Ultrasounds of the kidney can show blockages and the status of the kidney. Ultrasounds are not associated with any radiation and can be performed easily in a matter of minutes. SEEK MEDICAL CARE IF:  You experience pain that is progressive and unresponsive to any pain medicine you have been prescribed. SEEK IMMEDIATE MEDICAL CARE IF:   Pain cannot be controlled with the prescribed medicine.  You have a fever or shaking chills.  The severity or intensity of pain increases over 18 hours and is not relieved by pain medicine.  You develop a new onset of abdominal pain.  You feel faint or pass  out.  You are unable to urinate. MAKE SURE YOU:   Understand these instructions.  Will watch your condition.  Will get help right away if you are not doing well or get worse. Document Released: 12/02/2005 Document Revised: 08/04/2013 Document Reviewed: 05/05/2013 [facility] Patient Information 2014  [place].  Hematuria, Adult Hematuria is blood in your urine. It can be caused by a bladder infection, kidney infection, prostate infection, kidney stone, or cancer of your urinary tract. Infections can usually be treated with medicine, and a kidney stone usually will pass through your urine. If neither of these is the cause of your hematuria, further workup to find out the reason may be needed. It is very important that you tell your health care provider about any blood you see in your urine, even if the blood stops without treatment or happens without causing pain. Blood in your urine that happens and then stops and then happens again can be a symptom of a very serious condition. Also, pain is not a symptom in the initial stages of many urinary cancers. HOME CARE INSTRUCTIONS   Drink lots of fluid, 3 4 quarts a day. If you have been diagnosed with an infection, cranberry juice is especially recommended, in addition to large amounts of water.  Avoid caffeine, tea, and carbonated beverages, because they tend to irritate the bladder.  Avoid alcohol because it may irritate the prostate.  Only take over-the-counter or prescription medicines for pain, discomfort, or fever as directed by your health care provider.  If you have been diagnosed with a kidney stone, follow your health care provider's instructions regarding straining your urine to catch the stone.  Empty your bladder often. Avoid holding urine for long periods of time.  After a bowel movement, women should cleanse front to back. Use each tissue only once.  Empty your bladder before and after sexual intercourse if you are a male. SEEK MEDICAL CARE IF: You develop back pain, fever, a feeling of sickness in your stomach (nausea), or vomiting or if your symptoms are not better in 3 days. Return sooner if you are getting worse. SEEK IMMEDIATE MEDICAL CARE IF:   You have a persistent fever, with a temperature of 101.71F (38.8C) or  greater.  You develop severe vomiting and are unable to keep the medicine down.  You develop severe back or abdominal pain despite taking your medicines.  You begin passing a large amount of blood or clots in your urine.  You feel extremely weak or faint, or you pass out. MAKE SURE YOU:   Understand these instructions.  Will watch your condition.  Will get help right away if you are not doing well or get worse. Document Released: 12/02/2005 Document Revised: 09/22/2013 Document Reviewed: 08/02/2013 [facility] Patient Information 2014 [place].

## 2014-05-01 NOTE — ED Provider Notes (Signed)
I saw and evaluated the patient, reviewed the resident's note and I agree with the findings and plan.   EKG Interpretation None     43 year old male with left flank pain. On exam, uncomfortable, nontoxic, heart sounds normal regular rate and rhythm, lungs clear to auscultation bilaterally, abdomen with moderate tenderness in left upper and left lower quadrant without rigidity, rebound, or guarding. CT scan study confirms ureteral stone. No evidence of infection. Kidney function is good. Plan outpatient urology followup and pain control. Return precautions given.  Clinical Impression: 1. Kidney stone   2. Hematuria       Houston Siren III, MD 05/01/14 2340

## 2014-05-01 NOTE — ED Notes (Signed)
Wait explained, encouraged to wait, acuity changed d/t pain, labs VS and HPI reviewed.

## 2014-05-01 NOTE — ED Notes (Signed)
Pt states this afternoon had episode of "pressure" while urinating followed by left sided flank 7/10 pain radiating into groin.  Denies hematuria. States pain comes and goes. Denies F/C. Denies N/V/D.

## 2014-05-01 NOTE — ED Notes (Signed)
Pt transported to CT ?

## 2014-05-01 NOTE — ED Provider Notes (Signed)
CSN: [id number]     Arrival date & time 05/01/14  1745 History   First MD Initiated Contact with Patient 05/01/14 2036     Chief Complaint  Patient presents with  . Flank Pain  . Dysuria     (Consider location/radiation/quality/duration/timing/severity/associated sxs/prior Treatment) Patient is a 43 y.o. male presenting with abdominal pain. The history is provided by the patient and medical records. No language interpreter was used.  Abdominal Pain Pain location:  LLQ, LUQ and L flank Pain quality: aching   Pain radiates to:  Does not radiate Pain severity:  Severe Onset quality:  Sudden Duration:  7 hours Timing:  Constant Progression:  Worsening Chronicity:  New Context: not alcohol use, not diet changes, not medication withdrawal, not suspicious food intake and not trauma   Relieved by:  Nothing Worsened by:  Urination Ineffective treatments:  Not moving, urination, eating, movement and palpation Associated symptoms: dysuria and nausea   Associated symptoms: no chest pain, no cough, no hematuria, no shortness of breath and no vomiting     Past Medical History  Diagnosis Date  . Depression   . Allergy   . Hyperlipidemia   . Alcohol abuse   . Diabetes mellitus without complication 2/26    type 2   Past Surgical History  Procedure Laterality Date  . Mandible fracture surgery  2010    playing baseball   Family History  Problem Relation Age of Onset  . Diabetes Mother     type 1  . Heart disease Mother     pacemaker  . Alcohol abuse Father   . Alcohol abuse Maternal Uncle   . Diabetes Maternal Grandmother   . Alcohol abuse Maternal Grandfather   . Diabetes Maternal Grandfather    History  Substance Use Topics  . Smoking status: Never Smoker   . Smokeless tobacco: Not on file  . Alcohol Use: Yes    Review of Systems  Respiratory: Negative for cough and shortness of breath.   Cardiovascular: Negative for chest pain.  Gastrointestinal: Positive for nausea  and abdominal pain. Negative for vomiting.  Genitourinary: Positive for dysuria. Negative for hematuria.  All other systems reviewed and are negative.     Allergies  Review of patient's allergies indicates no known allergies.  Home Medications   Prior to Admission medications   Medication Sig Start Date End Date Taking? Authorizing Provider  amphetamine-dextroamphetamine (ADDERALL) 20 MG tablet Take 20 mg by mouth daily.   Yes Historical Provider, MD  ibuprofen (ADVIL,MOTRIN) 200 MG tablet Take 400 mg by mouth 2 (two) times daily as needed (pain).   Yes Historical Provider, MD  Magnesium 250 MG TABS Take 250 mg by mouth at bedtime.   Yes Historical Provider, MD  metFORMIN (GLUCOPHAGE) 500 MG tablet Take 1,000 mg by mouth 2 (two) times daily with a meal.   Yes Historical Provider, MD  Multiple Vitamin (MULTIVITAMIN WITH MINERALS) TABS tablet Take 1 tablet by mouth daily.   Yes Historical Provider, MD  amphetamine-dextroamphetamine (ADDERALL) 20 MG tablet Take 1 tablet (20 mg total) by mouth 2 (two) times daily. 04/29/14   Eulas Post, MD  amphetamine-dextroamphetamine (ADDERALL) 20 MG tablet Take 1 tablet (20 mg total) by mouth 2 (two) times daily. 04/29/14   Eulas Post, MD  amphetamine-dextroamphetamine (ADDERALL) 20 MG tablet Take 1 tablet (20 mg total) by mouth 2 (two) times daily. 04/29/14   Eulas Post, MD  glucose blood (ONETOUCH VERIO) test strip 1 each by Other  route 2 (two) times daily. 01/25/14   Eulas Post, MD  metFORMIN (GLUCOPHAGE) 500 MG tablet Take 2 (two ) tablets by mouth 2 times daily. 01/26/14   Eulas Post, MD  [facility] DELICA LANCETS FINE MISC Use as instructed. Check once daily. DX: 250.00 01/27/14   Eulas Post, MD   BP 178/94  Pulse 69  Temp(Src) 97.4 F (36.3 C) (Oral)  Resp 12  Ht 6' 4"  (1.93 m)  Wt 175 lb (79.379 kg)  BMI 21.31 kg/m2  SpO2 100% Physical Exam  Nursing note and vitals reviewed. Constitutional: He is oriented  to person, place, and time. He appears well-developed and well-nourished.  HENT:  Head: Normocephalic and atraumatic.  Right Ear: External ear normal.  Left Ear: External ear normal.  Nose: Nose normal.  Eyes: Conjunctivae and EOM are normal. Pupils are equal, round, and reactive to light.  Neck: Normal range of motion. Neck supple.  Cardiovascular: Normal rate, regular rhythm, normal heart sounds and intact distal pulses.   Pulmonary/Chest: Effort normal and breath sounds normal. No respiratory distress. He has no wheezes.  Abdominal: Soft. Bowel sounds are normal. He exhibits no distension and no mass. There is tenderness (over left flank and LUQ). There is no rebound and no guarding.  Genitourinary: Testes normal and penis normal. Right testis shows no mass and no tenderness. Left testis shows no mass and no tenderness.  Musculoskeletal: Normal range of motion. He exhibits tenderness (TTP over left flank).  Neurological: He is alert and oriented to person, place, and time.  Skin: Skin is warm and dry.  Psychiatric: He has a normal mood and affect.    ED Course  Procedures (including critical care time) Labs Review Labs Reviewed  COMPREHENSIVE METABOLIC PANEL - Abnormal; Notable for the following:    Chloride 92 (*)    Glucose, Bld 190 (*)    AST 53 (*)    All other components within normal limits  URINALYSIS, ROUTINE W REFLEX MICROSCOPIC - Abnormal; Notable for the following:    APPearance TURBID (*)    Hgb urine dipstick LARGE (*)    Bilirubin Urine SMALL (*)    Ketones, ur 15 (*)    Protein, ur 30 (*)    Leukocytes, UA MODERATE (*)    All other components within normal limits  URINE MICROSCOPIC-ADD ON - Abnormal; Notable for the following:    Crystals CA OXALATE CRYSTALS (*)    All other components within normal limits  CBC WITH DIFFERENTIAL  LIPASE, BLOOD    Imaging Review Ct Abdomen Pelvis Wo Contrast  05/01/2014   CLINICAL DATA:  FLANK PAIN DYSURIA  EXAM: CT  ABDOMEN AND PELVIS WITHOUT CONTRAST  TECHNIQUE: Multidetector CT imaging of the abdomen and pelvis was performed following the standard protocol without IV contrast.  COMPARISON:  None.  FINDINGS: The lung bases are unremarkable.  Noncontrast evaluation of the liver demonstrates diffuse low attenuating architecture, with areas of sparing adjacent to the gallbladder fossa. Gallbladder fossa is unremarkable. The spleen, adrenals, pancreas are unremarkable.  Minimal 2 Mild hydronephrosis within the left kidney and mild hydroureter secondary to a 4 mm and 3 mm calculus in the distal aspect of the vesicoureteral junction on the left. Nonobstructing medullary calculi in the midpole left kidney measuring 3 mm and 5 mm. Small amount of free fluid is appreciated within the perinephric fat and mild inflammatory change. The left kidney is edematous.  The right kidney is unremarkable.  Otherwise no evidence of abdominal  or pelvic free fluid, loculated fluid collections, masses, nor adenopathy. There is no evidence of abdominal aortic aneurysm.  No abdominal wall or inguinal hernia appreciated.  The bowel is negative.  The appendix is identified and unremarkable.  There no aggressive appearing osseous lesions.  IMPRESSION: Distal left ureteral calculi projecting in the distal aspect of the ureteral vesicle junction with associated minimal to mild obstructive uropathy there also findings which may reflect pyelonephritis and correlation with urinalysis recommended. Nonobstructing medullary calculi within the left kidney.  Hepatic steatosis   Electronically Signed   By: Margaree Mackintosh M.D.   On: 05/01/2014 23:16     EKG Interpretation None      MDM   Final diagnoses:  Kidney stone  Hematuria    Patient presents with left sided abdominal pain concerning for nephrolithiasis.  AF and VS unremarkable.  PE as above and remarkable benign abdominal exam and normal testicular exam.  CT shows evidence of 3 and 4 mm stones and  mild hydro.  UA and labs unremarkable.  Will discharge on percocet, flomax, and zofran.  He was given return precautions and will follow up with PCP.      Corlis Leak, MD 05/02/14 [phone]

## 2014-05-02 NOTE — ED Provider Notes (Signed)
I saw and evaluated the patient, reviewed the resident's note and I agree with the findings and plan.   EKG Interpretation None        Houston Siren III, MD 05/02/14 [phone]

## 2014-05-12 ENCOUNTER — Ambulatory Visit (INDEPENDENT_AMBULATORY_CARE_PROVIDER_SITE_OTHER): Payer: BC Managed Care – PPO | Admitting: Internal Medicine

## 2014-05-12 ENCOUNTER — Telehealth: Payer: Self-pay | Admitting: Family Medicine

## 2014-05-12 ENCOUNTER — Encounter: Payer: Self-pay | Admitting: Internal Medicine

## 2014-05-12 VITALS — BP 142/90 | Temp 97.9°F | Ht 76.0 in | Wt 175.0 lb

## 2014-05-12 DIAGNOSIS — I8390 Asymptomatic varicose veins of unspecified lower extremity: Secondary | ICD-10-CM | POA: Insufficient documentation

## 2014-05-12 DIAGNOSIS — E1165 Type 2 diabetes mellitus with hyperglycemia: Secondary | ICD-10-CM | POA: Insufficient documentation

## 2014-05-12 DIAGNOSIS — E785 Hyperlipidemia, unspecified: Secondary | ICD-10-CM

## 2014-05-12 DIAGNOSIS — IMO0002 Reserved for concepts with insufficient information to code with codable children: Secondary | ICD-10-CM | POA: Insufficient documentation

## 2014-05-12 DIAGNOSIS — IMO0001 Reserved for inherently not codable concepts without codable children: Secondary | ICD-10-CM

## 2014-05-12 MED ORDER — SITAGLIPTIN PHOSPHATE 100 MG PO TABS: 100.0000 mg | ORAL_TABLET | Freq: Every day | ORAL | Status: DC

## 2014-05-12 MED ORDER — GLUCOSE BLOOD VI STRP: 1.0000 | ORAL_STRIP | Freq: Two times a day (BID) | Status: DC

## 2014-05-12 MED ORDER — BENAZEPRIL HCL 5 MG PO TABS: 5.0000 mg | ORAL_TABLET | Freq: Every day | ORAL | Status: DC

## 2014-05-12 MED ORDER — ONETOUCH DELICA LANCETS FINE MISC: Status: DC

## 2014-05-12 MED ORDER — ATORVASTATIN CALCIUM 10 MG PO TABS: 10.0000 mg | ORAL_TABLET | Freq: Every day | ORAL | Status: DC

## 2014-05-12 NOTE — Assessment & Plan Note (Signed)
Patient has mild varicose veins that are asymptomatic. Reassured patient.

## 2014-05-12 NOTE — Telephone Encounter (Signed)
Patient Information:  Caller Name: [place]  Phone: [phone]  Patient: Carlos [name], Carlos [name]  Gender: Male  DOB: [birth year]  Age: 43 Years  PCP: Carolann Littler [facility])  Office Follow Up:  Does the office need to follow up with this patient?: No  Instructions For The Office: N/A  RN Note:  Spoke with Curt Bears in office and received authorization for office appt today. Pt booked in first available appt he stated he could make it to this afternoon.  Symptoms  Reason For Call & Symptoms: Area on each calf that "looks like a vein popping out." Onset today; noticed when he woke up. R calf not sore but L calf is sore/tender in that area. No other sxs. No hx of clots.  Reviewed Health History In EMR: Yes  Reviewed Medications In EMR: Yes  Reviewed Allergies In EMR: Yes  Reviewed Surgeries / Procedures: Yes  Date of Onset of Symptoms: 05/12/2014  Guideline(s) Used:  Leg Swelling and Edema  Disposition Per Guideline:   Go to ED Now (or to Office with PCP Approval)  Reason For Disposition Reached:   Thigh, calf, or ankle swelling in both legs, but one side is definitely more swollen  Advice Given:  N/A  Patient Will Follow Care Advice:  YES  Appointment Scheduled:  05/12/2014 15:45:00 Appointment Scheduled Provider:  Shawna Orleans, [facility] [place]) (Adults only)

## 2014-05-12 NOTE — Progress Notes (Signed)
Subjective:    Patient ID: Carlos [name], male    DOB: 1971-12-06, 43 y.o.   MRN: [id number]  HPI  43 year old white male with recently diagnosed type 2 diabetes uncontrolled presents with prominent veins in the lower tabs bilaterally. Patient noticed several days ago. They are asymptomatic. He denies any redness, swelling or tenderness.  Uncontrolled type 2 diabetes-patient reports he was diagnosed approximately year ago. He currently takes metformin thousand milligrams twice daily. He checks his fasting blood sugars couple times a week. They're usually about 180. Patient reports following low-carb diet. He has family history of type 1 diabetes (mother).  He BMI is low normal.  Lab Results  Component Value Date   HGBA1C 7.8* 01/25/2014   Blood pressure is high normal.  He is mild microalbuminuria.  Review of Systems     Past Medical History  Diagnosis Date  . Depression   . Allergy   . Hyperlipidemia   . Alcohol abuse   . Diabetes mellitus without complication 7/56    type 2    History   Social History  . Marital Status: Married    Spouse Name: N/A    Number of Children: N/A  . Years of Education: N/A   Occupational History  . Not on file.   Social History Main Topics  . Smoking status: Never Smoker   . Smokeless tobacco: Not on file  . Alcohol Use: Yes  . Drug Use: No  . Sexual Activity: Not on file   Other Topics Concern  . Not on file   Social History Narrative  . No narrative on file    Past Surgical History  Procedure Laterality Date  . Mandible fracture surgery  2010    playing baseball    Family History  Problem Relation Age of Onset  . Diabetes Mother     type 1  . Heart disease Mother     pacemaker  . Alcohol abuse Father   . Alcohol abuse Maternal Uncle   . Diabetes Maternal Grandmother   . Alcohol abuse Maternal Grandfather   . Diabetes Maternal Grandfather     No Known Allergies  Current Outpatient Prescriptions on File Prior  to Visit  Medication Sig Dispense Refill  . amphetamine-dextroamphetamine (ADDERALL) 20 MG tablet Take 20 mg by mouth daily.      Marland Kitchen glucose blood (ONETOUCH VERIO) test strip 1 each by Other route 2 (two) times daily.  100 each  12  . ibuprofen (ADVIL,MOTRIN) 200 MG tablet Take 400 mg by mouth 2 (two) times daily as needed (pain).      . Magnesium 250 MG TABS Take 250 mg by mouth at bedtime.      . metFORMIN (GLUCOPHAGE) 500 MG tablet Take 1,000 mg by mouth 2 (two) times daily with a meal.      . Multiple Vitamin (MULTIVITAMIN WITH MINERALS) TABS tablet Take 1 tablet by mouth daily.      Glory Rosebush DELICA LANCETS FINE MISC Use as instructed. Check once daily. DX: 250.00  100 each  12   No current facility-administered medications on file prior to visit.    BP 142/90  Temp(Src) 97.9 F (36.6 C) (Oral)  Ht 6' 4"  (1.93 m)  Wt 175 lb (79.379 kg)  BMI 21.31 kg/m2    Objective:   Physical Exam  Constitutional:  Thin, pleasant 43 year old male  HENT:  Head: Normocephalic and atraumatic.  Neck: Neck supple.  No carotid bruit  Cardiovascular: Normal  rate, regular rhythm and normal heart sounds.   Pulmonary/Chest: Effort normal and breath sounds normal. He has no wheezes.  Musculoskeletal: He exhibits no edema.  Skin: Skin is warm and dry.  Bilateral slightly prominent varicose veins  Psychiatric: He has a normal mood and affect. His behavior is normal.          Assessment & Plan:

## 2014-05-12 NOTE — Assessment & Plan Note (Signed)
Start atorvastatin 10 mg once daily.  We discussed potential side effects.

## 2014-05-12 NOTE — Progress Notes (Signed)
Pre visit review using our clinic review tool, if applicable. No additional management support is needed unless otherwise documented below in the visit note. 

## 2014-05-12 NOTE — Patient Instructions (Signed)
Follow up with Dr. Elease Hashimoto within 2 months. Please complete blood work 2-3 days before your office visit. (Lab ordered already entered in EPIC)

## 2014-05-12 NOTE — Telephone Encounter (Signed)
Pt coming to see Dr. Shawna Orleans

## 2014-05-12 NOTE — Assessment & Plan Note (Signed)
Patient diagnosed a year ago. His fasting blood sugar still uncontrolled despite following low-carb diet and maximum dose of metformin. Add Januvia. Considering patient's body weight is low normal, check a C-peptide level.  He may require earlier transition to insulin therapy.  Check screening iron studies to rule out hemachromatosis.  Add low dose benazepril for renal protection.    Despite normal LDL, patient advised to start atorvastatin 10 mg and low dose aspirin.  Arrange follow up labs before next follow up with his PCP.

## 2014-05-13 ENCOUNTER — Other Ambulatory Visit: Payer: Self-pay

## 2014-05-13 MED ORDER — GLUCOSE BLOOD VI STRP: 1.0000 | ORAL_STRIP | Freq: Two times a day (BID) | Status: DC

## 2014-07-22 ENCOUNTER — Other Ambulatory Visit (INDEPENDENT_AMBULATORY_CARE_PROVIDER_SITE_OTHER): Payer: BC Managed Care – PPO

## 2014-07-22 DIAGNOSIS — IMO0001 Reserved for inherently not codable concepts without codable children: Secondary | ICD-10-CM

## 2014-07-22 DIAGNOSIS — E119 Type 2 diabetes mellitus without complications: Secondary | ICD-10-CM

## 2014-07-22 DIAGNOSIS — E1165 Type 2 diabetes mellitus with hyperglycemia: Secondary | ICD-10-CM

## 2014-07-22 DIAGNOSIS — E785 Hyperlipidemia, unspecified: Secondary | ICD-10-CM

## 2014-07-22 DIAGNOSIS — Z0000 Encounter for general adult medical examination without abnormal findings: Secondary | ICD-10-CM

## 2014-07-22 LAB — POCT URINALYSIS DIPSTICK
Glucose, UA: NEGATIVE
KETONES UA: NEGATIVE
Leukocytes, UA: NEGATIVE
Nitrite, UA: NEGATIVE
PH UA: 5.5
Spec Grav, UA: 1.015
Urobilinogen, UA: 0.2

## 2014-07-22 LAB — HEPATIC FUNCTION PANEL
ALT: 35 U/L (ref 0–53)
AST: 34 U/L (ref 0–37)
Albumin: 4 g/dL (ref 3.5–5.2)
Alkaline Phosphatase: 61 U/L (ref 39–117)
BILIRUBIN DIRECT: 0.2 mg/dL (ref 0.0–0.3)
TOTAL PROTEIN: 7.2 g/dL (ref 6.0–8.3)
Total Bilirubin: 1.2 mg/dL (ref 0.2–1.2)

## 2014-07-22 LAB — BASIC METABOLIC PANEL
BUN: 12 mg/dL (ref 6–23)
CHLORIDE: 95 meq/L — AB (ref 96–112)
CO2: 29 meq/L (ref 19–32)
CREATININE: 0.9 mg/dL (ref 0.4–1.5)
Calcium: 9.4 mg/dL (ref 8.4–10.5)
GFR: 93.52 mL/min (ref >60.00)
Glucose, Bld: 150 mg/dL — ABNORMAL HIGH (ref 70–99)
POTASSIUM: 4.2 meq/L (ref 3.5–5.1)
SODIUM: 134 meq/L — AB (ref 135–145)

## 2014-07-22 LAB — LIPID PANEL
CHOL/HDL RATIO: 5
Cholesterol: 157 mg/dL (ref 0–200)
HDL: 30.8 mg/dL — AB (ref >39.00)
NonHDL: 126.2
TRIGLYCERIDES: 325 mg/dL — AB (ref 0.0–149.0)
VLDL: 65 mg/dL — AB (ref 0.0–40.0)

## 2014-07-22 LAB — CBC WITH DIFFERENTIAL/PLATELET
BASOS ABS: 0 10*3/uL (ref 0.0–0.1)
BASOS PCT: 0.3 % (ref 0.0–3.0)
EOS ABS: 0.1 10*3/uL (ref 0.0–0.7)
Eosinophils Relative: 1.8 % (ref 0.0–5.0)
HCT: 45.6 % (ref 39.0–52.0)
Hemoglobin: 16 g/dL (ref 13.0–17.0)
Lymphocytes Relative: 23.4 % (ref 12.0–46.0)
Lymphs Abs: 1.5 10*3/uL (ref 0.7–4.0)
MCHC: 35.1 g/dL (ref 30.0–36.0)
MCV: 96.1 fl (ref 78.0–100.0)
MONO ABS: 0.4 10*3/uL (ref 0.1–1.0)
Monocytes Relative: 5.8 % (ref 3.0–12.0)
NEUTROS PCT: 68.7 % (ref 43.0–77.0)
Neutro Abs: 4.5 10*3/uL (ref 1.4–7.7)
Platelets: 198 10*3/uL (ref 150.0–400.0)
RBC: 4.74 Mil/uL (ref 4.22–5.81)
RDW: 12.9 % (ref 11.5–15.5)
WBC: 6.6 10*3/uL (ref 4.0–10.5)

## 2014-07-22 LAB — IBC PANEL
Iron: 153 ug/dL (ref 42–165)
Saturation Ratios: 37.4 % (ref 20.0–50.0)
TRANSFERRIN: 292.4 mg/dL (ref 212.0–360.0)

## 2014-07-22 LAB — HEMOGLOBIN A1C: HEMOGLOBIN A1C: 7 % — AB (ref 4.6–6.5)

## 2014-07-22 LAB — LDL CHOLESTEROL, DIRECT: Direct LDL: 84.6 mg/dL

## 2014-07-22 LAB — FERRITIN: Ferritin: 191.5 ng/mL (ref 22.0–322.0)

## 2014-07-22 LAB — TSH: TSH: 1.94 u[IU]/mL (ref 0.35–4.50)

## 2014-07-23 LAB — C-PEPTIDE: C PEPTIDE: 1.9 ng/mL (ref 0.80–3.90)

## 2014-07-26 ENCOUNTER — Ambulatory Visit: Payer: BC Managed Care – PPO | Admitting: Family Medicine

## 2014-07-29 ENCOUNTER — Telehealth: Payer: Self-pay | Admitting: Family Medicine

## 2014-07-29 MED ORDER — AMPHETAMINE-DEXTROAMPHETAMINE 20 MG PO TABS
20.0000 mg | ORAL_TABLET | Freq: Every day | ORAL | Status: DC
Start: 2014-07-29 — End: 2014-08-08

## 2014-07-29 MED ORDER — AMPHETAMINE-DEXTROAMPHETAMINE 20 MG PO TABS: 20.0000 mg | ORAL_TABLET | Freq: Every day | ORAL | Status: DC

## 2014-07-29 NOTE — Telephone Encounter (Signed)
Pt informed that RX is ready for pick up

## 2014-07-29 NOTE — Telephone Encounter (Signed)
Last visit 05/12/14 Last refill 04/29/14 #60 2 refills

## 2014-07-29 NOTE — Telephone Encounter (Signed)
Refills ok

## 2014-07-29 NOTE — Telephone Encounter (Signed)
Pt states [facility] scanned his 3 amphetamine-dextroamphetamine (ADDERALL) 20 MG tablet rxs in the system, but the  3rd one is not there anymore.  Advised pt to call his pcp for another rx. pls advise

## 2014-08-02 ENCOUNTER — Ambulatory Visit: Payer: BC Managed Care – PPO | Admitting: Family Medicine

## 2014-08-08 ENCOUNTER — Ambulatory Visit (INDEPENDENT_AMBULATORY_CARE_PROVIDER_SITE_OTHER): Payer: BC Managed Care – PPO | Admitting: Family Medicine

## 2014-08-08 ENCOUNTER — Encounter: Payer: Self-pay | Admitting: Family Medicine

## 2014-08-08 VITALS — BP 130/80 | HR 94 | Temp 97.7°F | Wt 173.0 lb

## 2014-08-08 DIAGNOSIS — E1165 Type 2 diabetes mellitus with hyperglycemia: Principal | ICD-10-CM

## 2014-08-08 DIAGNOSIS — F988 Other specified behavioral and emotional disorders with onset usually occurring in childhood and adolescence: Secondary | ICD-10-CM

## 2014-08-08 DIAGNOSIS — IMO0001 Reserved for inherently not codable concepts without codable children: Secondary | ICD-10-CM

## 2014-08-08 DIAGNOSIS — R259 Unspecified abnormal involuntary movements: Secondary | ICD-10-CM

## 2014-08-08 DIAGNOSIS — R319 Hematuria, unspecified: Secondary | ICD-10-CM

## 2014-08-08 DIAGNOSIS — R253 Fasciculation: Secondary | ICD-10-CM

## 2014-08-08 LAB — POCT URINALYSIS DIPSTICK
BILIRUBIN UA: NEGATIVE
Leukocytes, UA: NEGATIVE
Nitrite, UA: NEGATIVE
SPEC GRAV UA: 1.02
Urobilinogen, UA: 0.2
pH, UA: 5.5

## 2014-08-08 LAB — URINALYSIS, MICROSCOPIC ONLY: RBC / HPF: NONE SEEN (ref 0–?)

## 2014-08-08 MED ORDER — AMPHETAMINE-DEXTROAMPHETAMINE 20 MG PO TABS: 20.0000 mg | ORAL_TABLET | Freq: Two times a day (BID) | ORAL | Status: DC

## 2014-08-08 NOTE — Progress Notes (Signed)
Pre visit review using our clinic review tool, if applicable. No additional management support is needed unless otherwise documented below in the visit note. 

## 2014-08-08 NOTE — Progress Notes (Signed)
   Subjective:    Patient ID: Carlos [name], male    DOB: 10/22/1971, 43 y.o.   MRN: [id number]  HPI Patient seen for followup multiple issues  Type 2 diabetes. He is a relatively thin type II diabetic with recent normal C-peptide. He does have very strong family history of type 2 diabetes. No suspicion of late onset autoimmune diabetes of adulthood.  Recent addition of Januvia and metformin. A1c 7.0%. He does exercise some and plans to start more consistently.  Patient had recent serum iron studies which were normal. These were apparently ordered to rule out hemochromatosis. Recent LDL cholesterol less than 100. Patient declined statin therapy with Lipitor.  Hematuria. No gross hematuria. Recent urine dipstick revealed 2+ blood and today trace. Had kidney stones by CT scan back in May 2015. No recent dysuria.  ADD. Patient takes Adderall 20 mg twice daily and recent prescription was only written for once daily. He needs to have this corrected. No history of misuse.  Recent new problem of involuntary muscle twitching right upper eyelid. Nonpainful. No clear provoking or alleviating factors.  Past Medical History  Diagnosis Date  . Depression   . Allergy   . Hyperlipidemia   . Alcohol abuse   . Diabetes mellitus without complication 8/67    type 2   Past Surgical History  Procedure Laterality Date  . Mandible fracture surgery  2010    playing baseball    reports that he has never smoked. He does not have any smokeless tobacco history on file. He reports that he drinks alcohol. He reports that he does not use illicit drugs. family history includes Alcohol abuse in his father, maternal grandfather, and maternal uncle; Diabetes in his maternal grandfather, maternal grandmother, and mother; Heart disease in his mother. No Known Allergies      Review of Systems  Constitutional: Negative for fatigue and unexpected weight change.  Eyes: Negative for visual disturbance.  Respiratory:  Negative for cough, chest tightness and shortness of breath.   Cardiovascular: Negative for chest pain, palpitations and leg swelling.  Endocrine: Negative for polydipsia and polyuria.  Neurological: Negative for dizziness, syncope, weakness, light-headedness and headaches.       Objective:   Physical Exam  Constitutional: He appears well-developed and well-nourished.  Cardiovascular: Normal rate and regular rhythm.  Exam reveals no gallop.   No murmur heard. Pulmonary/Chest: Effort normal and breath sounds normal. No respiratory distress. He has no wheezes. He has no rales.  Musculoskeletal: He exhibits no edema.          Assessment & Plan:  #1 type 2 diabetes. Improved control. We discussed the importance of regular exercise. Recheck A1c within 6 months. #2 dyslipidemia. We had long discussion regarding pros and cons of statin. He has decided against this. #3 attention deficit disorder. We rewrote his Adderall for 20 mg twice a day #4 benign muscle fasciculation right upper eyelid. Reassurance #5 trace blood on urine dipstick. Urine micro sent. Suspect related to recent kidney stones #6 health maintenance. Recommend a flu vaccine and patient declines

## 2014-09-21 ENCOUNTER — Telehealth: Payer: Self-pay | Admitting: Family Medicine

## 2014-09-21 NOTE — Telephone Encounter (Signed)
Pt would like montrice to call him back concerning a discussion with md about drug to control drinking alcohol

## 2014-09-21 NOTE — Telephone Encounter (Signed)
Pt wants to know is there anything that he can take as a medication or injection to help him not want to drink. He is going to support groups but it is not helping.

## 2014-09-23 NOTE — Telephone Encounter (Signed)
Pt set up appointment to be seen. Pt informed

## 2014-09-23 NOTE — Telephone Encounter (Signed)
Any drugs we would use for this can have potentially serious side effects.  Would set up follow up NEXT week to discuss pros and cons of options.

## 2014-09-26 ENCOUNTER — Ambulatory Visit (INDEPENDENT_AMBULATORY_CARE_PROVIDER_SITE_OTHER): Payer: BC Managed Care – PPO | Admitting: Family Medicine

## 2014-09-26 ENCOUNTER — Encounter: Payer: Self-pay | Admitting: Family Medicine

## 2014-09-26 VITALS — BP 128/80 | HR 88 | Wt 170.0 lb

## 2014-09-26 DIAGNOSIS — F1010 Alcohol abuse, uncomplicated: Secondary | ICD-10-CM

## 2014-09-26 MED ORDER — ACAMPROSATE CALCIUM 333 MG PO TBEC: 666.0000 mg | DELAYED_RELEASE_TABLET | Freq: Three times a day (TID) | ORAL | Status: DC

## 2014-09-26 NOTE — Progress Notes (Signed)
Pre visit review using our clinic review tool, if applicable. No additional management support is needed unless otherwise documented below in the visit note. 

## 2014-09-26 NOTE — Patient Instructions (Signed)
Acamprosate delayed-release tablets What is this medicine? ACAMPROSATE (a KAM pro sate) helps you to abstain from alcohol as part of a support program. It does not help with alcohol withdrawal. This medicine may be used for other purposes; ask your health care provider or pharmacist if you have questions. COMMON BRAND NAME(S): Campral What should I tell my health care provider before I take this medicine? They need to know if you have any of these conditions: -depression -kidney disease -suicidal thoughts or attempted suicide -an unusual or allergic reaction to Acamprosate, sulfites, other medicines, foods, dyes, or preservatives -pregnant or trying to get pregnant -breast-feeding How should I use this medicine? Take this medicine by mouth with a glass of water. Do not crush, cut or chew the tablets. Follow the directions on the prescription label. This medicine may be taken with or without food. Take your doses at regular intervals. Do not take your medicine more often than directed. Do not stop taking this medicine except on the advice of your doctor or health care professional. Talk to your pediatrician regarding the use of this medicine in children. Special care may be needed. Overdosage: If you think you have taken too much of this medicine contact a poison control center or emergency room at once. NOTE: This medicine is only for you. Do not share this medicine with others. What if I miss a dose? If you miss a dose, take it as soon as you can. If it is almost time for your next dose, take only that dose. Do not take double or extra doses. What may interact with this medicine? -naltrexone This list may not describe all possible interactions. Give your health care provider a list of all the medicines, herbs, non-prescription drugs, or dietary supplements you use. Also tell them if you smoke, drink alcohol, or use illegal drugs. Some items may interact with your medicine. What should I watch  for while using this medicine? Visit your doctor or health care professional for regular checks on your progress. You should continue taking this medicine even if you experience a relapse. Tell your doctor or health care professional if you begin drinking alcohol again while taking this medicine. You may get drowsy or dizzy. Do not drive, use machinery, or do anything that needs mental alertness until you know how this medicine affects you. Do not stand or sit up quickly, especially if you are an older patient. This reduces the risk of dizzy or fainting spells. What side effects may I notice from receiving this medicine? Side effects that you should report to your doctor or health care professional as soon as possible: -allergic reactions like skin rash, itching or hives, swelling of the face, lips, or tongue -anxiety or nervousness -burning, pricking, tickling or tingling in your arms, legs, hands, or feet -chest pain -depression -suicidal thoughts -trouble passing urine or change in the amount of urine Side effects that usually do not require medical attention (report to your doctor or health care professional if they continue or are bothersome): -diarrhea -itching -loss of appetite -nausea -sweating -trouble sleeping This list may not describe all possible side effects. Call your doctor for medical advice about side effects. You may report side effects to FDA at 1-800-FDA-1088. Where should I keep my medicine? Keep out of the reach of children. Store at room temperature between 15 and 30 degrees C (59 and 86 degrees F). Throw away any unused medicine after the expiration date. NOTE: This sheet is a summary. It may  not cover all possible information. If you have questions about this medicine, talk to your doctor, pharmacist, or health care provider.  2015, [employer] Standard. (2008-02-25 11:08:23)

## 2014-09-26 NOTE — Progress Notes (Signed)
   Subjective:    Patient ID: Carlos [name], male    DOB: [birth year], 43 y.o.   MRN: [id number]  HPI History of alcohol abuse. Patient has history of binge drinking. Has become more frequent recently. Sometimes drinks up to 8-10 ounces of whiskey per day frequently followed by 4-5 beers. Has been in [place] previously and he declines any inpatient or outpatient treatment programs this time. He has strong cravings. He previously took Antabuse but took himself off after some time.  He denies any active depression symptoms. 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 He has recently gone through separation from his wife. He has very supportive family. Diabetes which has been fairly well controlled. No history of DTs  Past Medical History  Diagnosis Date  . Depression   . Allergy   . Hyperlipidemia   . Alcohol abuse   . Diabetes mellitus without complication 1/85    type 2   Past Surgical History  Procedure Laterality Date  . Mandible fracture surgery  2010    playing baseball    reports that he has never smoked. He does not have any smokeless tobacco history on file. He reports that he drinks alcohol. He reports that he does not use illicit drugs. family history includes Alcohol abuse in his father, maternal grandfather, and maternal uncle; Diabetes in his maternal grandfather, maternal grandmother, and mother; Heart disease in his mother. No Known Allergies    Review of Systems  Constitutional: Negative for appetite change and unexpected weight change.  Respiratory: Negative for shortness of breath.   Cardiovascular: Negative for chest pain.  Gastrointestinal: Negative for abdominal pain.  Neurological: Negative for dizziness, syncope and headaches.  Psychiatric/Behavioral: Negative for agitation.       Objective:   Physical Exam  Constitutional: He is oriented to person, place, and time. He appears well-developed and  well-nourished.  Cardiovascular: Normal rate and regular rhythm.   Pulmonary/Chest: Effort normal and breath sounds normal. No respiratory distress. He has no wheezes. He has no rales.  Neurological: He is alert and oriented to person, place, and time.  Psychiatric: He has a normal mood and affect. His behavior is normal. Judgment and thought content normal.          Assessment & Plan:  Alcohol abuse. We have strongly advocated abstinence program with professional counseling but he is reluctant. He is specifically interested in medications to reduce relapse. We explained that these are generally more successful in the setting of complete comprehensive treatment program. We discussed risk and benefits of trial of Campral 333 mg 2 tablets 3 times daily. We reviewed possible side effects. He has normal renal function. We'll plan reassess 3 weeks and sooner as needed.  Over 25 minutes spent with patient and over 50% of this in counseling

## 2014-10-17 ENCOUNTER — Ambulatory Visit: Payer: BC Managed Care – PPO | Admitting: Family Medicine

## 2014-10-17 DIAGNOSIS — Z0289 Encounter for other administrative examinations: Secondary | ICD-10-CM

## 2014-11-14 ENCOUNTER — Telehealth: Payer: Self-pay | Admitting: Family Medicine

## 2014-11-14 MED ORDER — AMPHETAMINE-DEXTROAMPHETAMINE 20 MG PO TABS: 20.0000 mg | ORAL_TABLET | Freq: Two times a day (BID) | ORAL | Status: DC

## 2014-11-14 NOTE — Telephone Encounter (Signed)
Refills okay

## 2014-11-14 NOTE — Telephone Encounter (Signed)
Need Adderall refilled as soon as possible please. Did not know that we were closed on Friday and he is out. He is aware that we usually req up to 3 business days. OK to leave a message at [phone]  if you do not reach him.

## 2014-11-14 NOTE — Telephone Encounter (Signed)
Pt aware that RX is ready for pick up

## 2014-11-14 NOTE — Telephone Encounter (Signed)
Last visit 09/26/14 Last refill 08/08/14 #60 2 refill

## 2014-11-21 ENCOUNTER — Telehealth: Payer: Self-pay

## 2014-11-21 MED ORDER — METFORMIN HCL 500 MG PO TABS: 1000.0000 mg | ORAL_TABLET | Freq: Two times a day (BID) | ORAL | Status: DC

## 2014-11-21 MED ORDER — SITAGLIPTIN PHOSPHATE 100 MG PO TABS: 100.0000 mg | ORAL_TABLET | Freq: Every day | ORAL | Status: DC

## 2014-11-21 NOTE — Telephone Encounter (Signed)
Rx sent to pharmacy   

## 2014-11-21 NOTE — Telephone Encounter (Signed)
Rx request for:  Januvia 100 mg tablet- Take one tablet by mouth daily #30  Metformin HCL 500 mg- Take 2 tablets by mouth twice dailly #180  [facility]:  [facility]

## 2014-12-21 ENCOUNTER — Telehealth: Payer: Self-pay | Admitting: Family Medicine

## 2014-12-21 NOTE — Telephone Encounter (Signed)
Pt states that he taking the medication but it not helping.

## 2014-12-21 NOTE — Telephone Encounter (Signed)
Patient would like for you to talk to Dr. Elease Hashimoto about taking Antabuse medication previously discussed.  He would like a Adult nurse [facility]

## 2014-12-21 NOTE — Telephone Encounter (Signed)
Is he taking the Campral?

## 2014-12-23 MED ORDER — DISULFIRAM 500 MG PO TABS: 500.0000 mg | ORAL_TABLET | Freq: Every day | ORAL | Status: DC

## 2014-12-23 NOTE — Telephone Encounter (Signed)
STOP CAmpral. Antabuse 500 mg po once daily -He MUST not drink within 12 hours of starting this med and MUST NOT drink ANY while on it.  #30 and recommend office follow up one month' Monitor blood sugars closely while on this.

## 2014-12-23 NOTE — Telephone Encounter (Signed)
Rx sent to pharmacy. Pt is aware.

## 2015-02-09 ENCOUNTER — Telehealth: Payer: Self-pay | Admitting: Family Medicine

## 2015-02-09 MED ORDER — AMPHETAMINE-DEXTROAMPHETAMINE 20 MG PO TABS: 20.0000 mg | ORAL_TABLET | Freq: Two times a day (BID) | ORAL | Status: DC

## 2015-02-09 NOTE — Telephone Encounter (Signed)
Last visit 09/26/14 Last refill 11/14/14 #60 2 refill

## 2015-02-09 NOTE — Telephone Encounter (Signed)
Pt request refill amphetamine-dextroamphetamine (ADDERALL) 20 MG tablet 3 mo supply

## 2015-02-09 NOTE — Telephone Encounter (Signed)
Refill OK

## 2015-02-09 NOTE — Telephone Encounter (Signed)
Pt aware that Rx is ready for pick up 

## 2015-03-06 ENCOUNTER — Telehealth: Payer: Self-pay | Admitting: Family Medicine

## 2015-03-06 NOTE — Telephone Encounter (Signed)
OK to set up repeat A1C.

## 2015-03-06 NOTE — Telephone Encounter (Signed)
Pt called and he thinks he need to have his A1C check. I advised him that he probably would need to see the doctor since there is no order in. He said have [name] give me a call.

## 2015-03-06 NOTE — Telephone Encounter (Signed)
Pt last A1c was on 07/22/14 it was 7.0

## 2015-03-07 ENCOUNTER — Other Ambulatory Visit: Payer: Self-pay | Admitting: Family Medicine

## 2015-03-07 DIAGNOSIS — E118 Type 2 diabetes mellitus with unspecified complications: Secondary | ICD-10-CM

## 2015-03-07 NOTE — Telephone Encounter (Signed)
lmovm

## 2015-03-07 NOTE — Telephone Encounter (Signed)
Lab is ordered. Pt can get lab done any time. Can you please schedule.

## 2015-03-08 NOTE — Telephone Encounter (Signed)
Pt scheduled  

## 2015-03-14 ENCOUNTER — Other Ambulatory Visit: Payer: Self-pay

## 2015-03-14 ENCOUNTER — Telehealth: Payer: Self-pay

## 2015-03-14 NOTE — Telephone Encounter (Signed)
Ok to repeat lipid panel.

## 2015-03-14 NOTE — Telephone Encounter (Signed)
Pt wants to get his Lipid panel checked. Last lipid panel 07/2014

## 2015-03-15 ENCOUNTER — Other Ambulatory Visit: Payer: Self-pay | Admitting: Family Medicine

## 2015-03-15 DIAGNOSIS — E785 Hyperlipidemia, unspecified: Secondary | ICD-10-CM

## 2015-03-15 NOTE — Telephone Encounter (Signed)
Lab is ordered.

## 2015-03-16 ENCOUNTER — Other Ambulatory Visit: Payer: Self-pay

## 2015-03-21 ENCOUNTER — Telehealth: Payer: Self-pay | Admitting: Family Medicine

## 2015-03-21 ENCOUNTER — Other Ambulatory Visit (INDEPENDENT_AMBULATORY_CARE_PROVIDER_SITE_OTHER): Payer: BLUE CROSS/BLUE SHIELD

## 2015-03-21 ENCOUNTER — Encounter: Payer: Self-pay | Admitting: Family Medicine

## 2015-03-21 DIAGNOSIS — E118 Type 2 diabetes mellitus with unspecified complications: Secondary | ICD-10-CM

## 2015-03-21 DIAGNOSIS — E785 Hyperlipidemia, unspecified: Secondary | ICD-10-CM

## 2015-03-21 LAB — LIPID PANEL
CHOL/HDL RATIO: 5
CHOLESTEROL: 204 mg/dL — AB (ref 0–200)
HDL: 37.1 mg/dL — ABNORMAL LOW (ref >39.00)

## 2015-03-21 LAB — LDL CHOLESTEROL, DIRECT: Direct LDL: 65 mg/dL

## 2015-03-21 LAB — HEMOGLOBIN A1C: HEMOGLOBIN A1C: 7.4 % — AB (ref 4.6–6.5)

## 2015-03-21 NOTE — Telephone Encounter (Signed)
Pt would like montrice to return his call concerning lab appt he had today

## 2015-03-21 NOTE — Telephone Encounter (Signed)
Spoke with patient.

## 2015-03-23 ENCOUNTER — Other Ambulatory Visit: Payer: Self-pay

## 2015-04-18 ENCOUNTER — Ambulatory Visit: Payer: BLUE CROSS/BLUE SHIELD | Admitting: Family Medicine

## 2015-04-18 DIAGNOSIS — Z0289 Encounter for other administrative examinations: Secondary | ICD-10-CM

## 2015-04-24 ENCOUNTER — Emergency Department (HOSPITAL_COMMUNITY)
Admission: EM | Admit: 2015-04-24 | Discharge: 2015-04-24 | Disposition: A | Discharge status: Home / Self Care | Payer: BLUE CROSS/BLUE SHIELD | Attending: Emergency Medicine | Admitting: Emergency Medicine

## 2015-04-24 ENCOUNTER — Emergency Department (HOSPITAL_COMMUNITY): Payer: BLUE CROSS/BLUE SHIELD

## 2015-04-24 ENCOUNTER — Encounter (HOSPITAL_COMMUNITY): Payer: Self-pay | Admitting: Emergency Medicine

## 2015-04-24 DIAGNOSIS — R109 Unspecified abdominal pain: Secondary | ICD-10-CM

## 2015-04-24 DIAGNOSIS — R1032 Left lower quadrant pain: Secondary | ICD-10-CM | POA: Diagnosis present

## 2015-04-24 DIAGNOSIS — Z7982 Long term (current) use of aspirin: Secondary | ICD-10-CM | POA: Insufficient documentation

## 2015-04-24 DIAGNOSIS — Z79899 Other long term (current) drug therapy: Secondary | ICD-10-CM | POA: Diagnosis not present

## 2015-04-24 DIAGNOSIS — E119 Type 2 diabetes mellitus without complications: Secondary | ICD-10-CM | POA: Diagnosis not present

## 2015-04-24 DIAGNOSIS — Z8659 Personal history of other mental and behavioral disorders: Secondary | ICD-10-CM | POA: Insufficient documentation

## 2015-04-24 DIAGNOSIS — N200 Calculus of kidney: Secondary | ICD-10-CM | POA: Diagnosis not present

## 2015-04-24 LAB — URINALYSIS, ROUTINE W REFLEX MICROSCOPIC
BILIRUBIN URINE: NEGATIVE
GLUCOSE, UA: NEGATIVE mg/dL
Ketones, ur: >80 mg/dL — AB
Leukocytes, UA: NEGATIVE
Nitrite: NEGATIVE
PROTEIN: NEGATIVE mg/dL
Specific Gravity, Urine: 1.019 (ref 1.005–1.030)
Urobilinogen, UA: 0.2 mg/dL (ref 0.0–1.0)
pH: 7.5 (ref 5.0–8.0)

## 2015-04-24 LAB — BASIC METABOLIC PANEL
ANION GAP: 9 (ref 5–15)
BUN: 17 mg/dL (ref 6–20)
CALCIUM: 9.1 mg/dL (ref 8.9–10.3)
CO2: 27 mmol/L (ref 22–32)
CREATININE: 1.51 mg/dL — AB (ref 0.61–1.24)
Chloride: 99 mmol/L — ABNORMAL LOW (ref 101–111)
GFR calc non Af Amer: 55 mL/min — ABNORMAL LOW (ref >60)
GLUCOSE: 220 mg/dL — AB (ref 70–99)
Potassium: 4.1 mmol/L (ref 3.5–5.1)
SODIUM: 135 mmol/L (ref 135–145)

## 2015-04-24 LAB — URINE MICROSCOPIC-ADD ON

## 2015-04-24 LAB — CBC WITH DIFFERENTIAL/PLATELET
BASOS ABS: 0 10*3/uL (ref 0.0–0.1)
Basophils Relative: 0 % (ref 0–1)
EOS ABS: 0.5 10*3/uL (ref 0.0–0.7)
Eosinophils Relative: 5 % (ref 0–5)
HCT: 43.1 % (ref 39.0–52.0)
Hemoglobin: 15.6 g/dL (ref 13.0–17.0)
Lymphocytes Relative: 19 % (ref 12–46)
Lymphs Abs: 1.8 10*3/uL (ref 0.7–4.0)
MCH: 33.8 pg (ref 26.0–34.0)
MCHC: 36.2 g/dL — ABNORMAL HIGH (ref 30.0–36.0)
MCV: 93.5 fL (ref 78.0–100.0)
MONOS PCT: 7 % (ref 3–12)
Monocytes Absolute: 0.7 10*3/uL (ref 0.1–1.0)
NEUTROS PCT: 69 % (ref 43–77)
Neutro Abs: 6.7 10*3/uL (ref 1.7–7.7)
PLATELETS: 154 10*3/uL (ref 150–400)
RBC: 4.61 MIL/uL (ref 4.22–5.81)
RDW: 12 % (ref 11.5–15.5)
WBC: 9.7 10*3/uL (ref 4.0–10.5)

## 2015-04-24 IMAGING — CT CT RENAL STONE PROTOCOL
2 of 4 series · 16 of 46 positions shown, 18 images · non-contrast
Comparison: [DATE]

CLINICAL DATA: Lower abdominal pain, left flank pain, left back
pain

EXAM:
CT ABDOMEN AND PELVIS WITHOUT CONTRAST
TECHNIQUE: Multidetector CT imaging of the abdomen and pelvis was performed
following the standard protocol without IV contrast.

[Series 2: stone study 5.0 i30f 1 · axial · 0.71mm/px · z∈[-210,+205]mm · 13 of 94 slices shown, 15 images]
[im 7/94  soft-tissue]
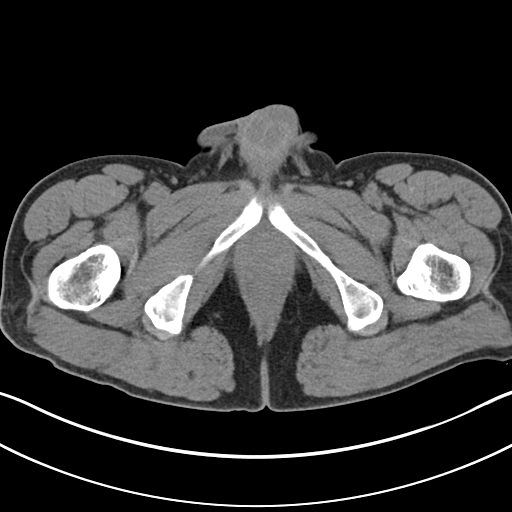
[im 7/94  bone]
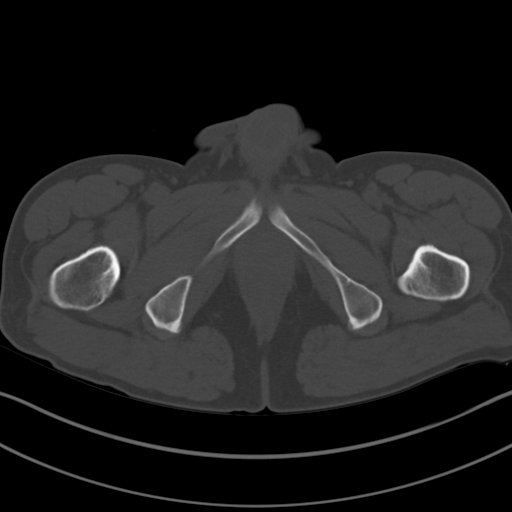
[im 14/94  soft-tissue]
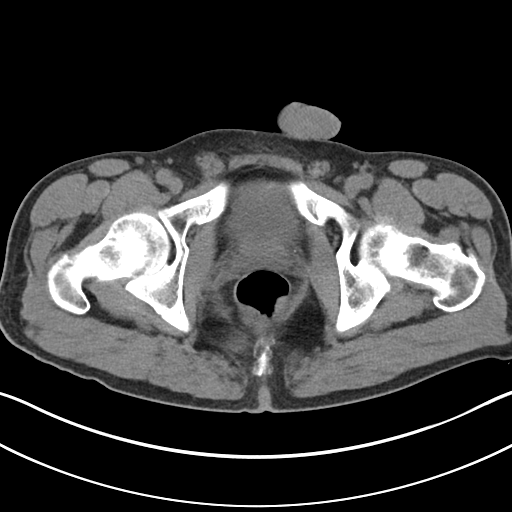
[im 21/94  soft-tissue]
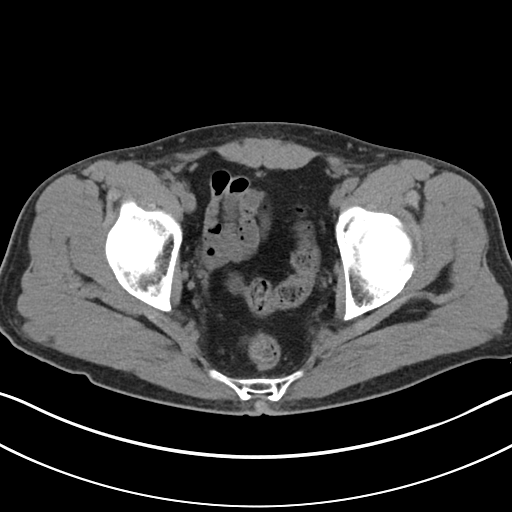
[im 28/94  soft-tissue]
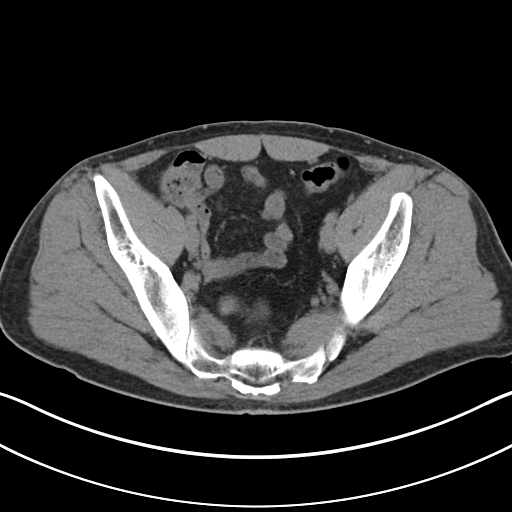
[im 35/94  soft-tissue]
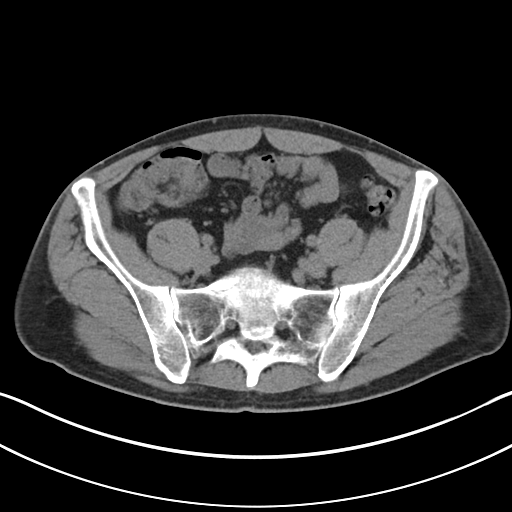
[im 42/94  soft-tissue]
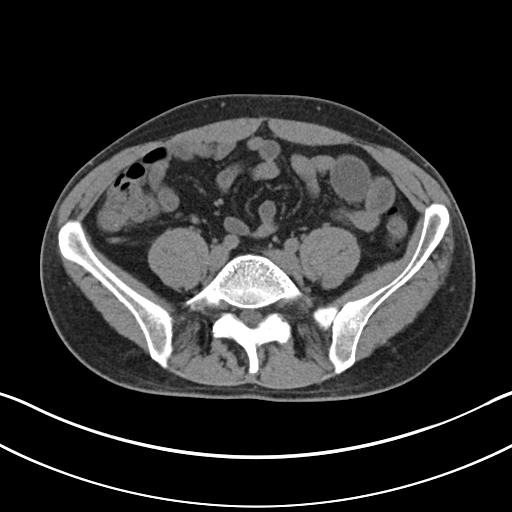
[im 49/94  soft-tissue]
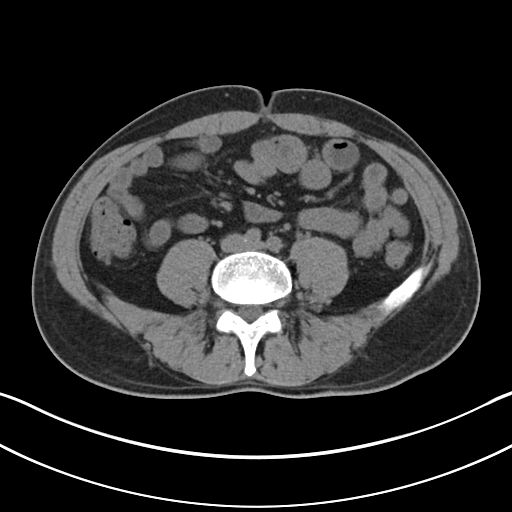
[im 56/94  soft-tissue]
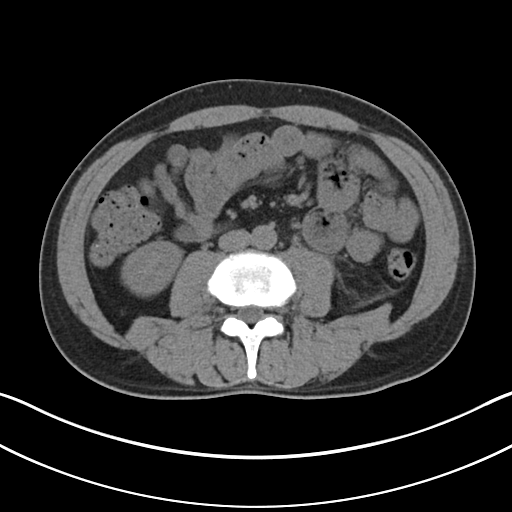
[im 63/94  soft-tissue]
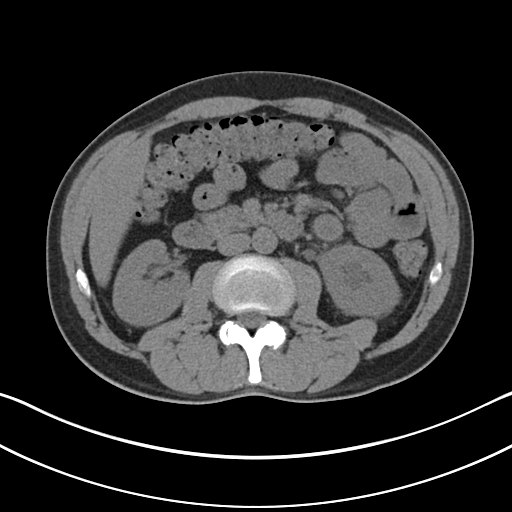
[im 63/94  bone]
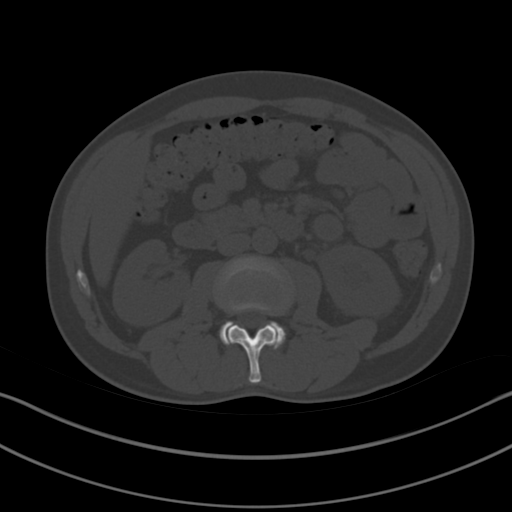
[im 69/94  soft-tissue]
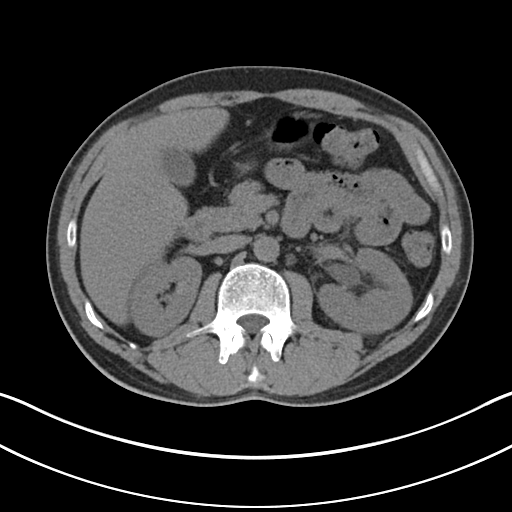
[im 76/94  soft-tissue]
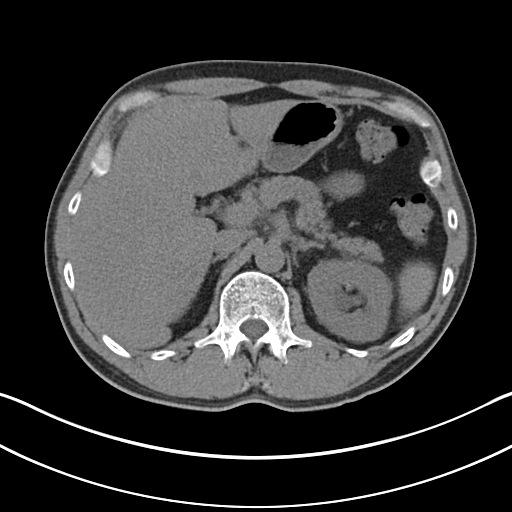
[im 83/94  soft-tissue]
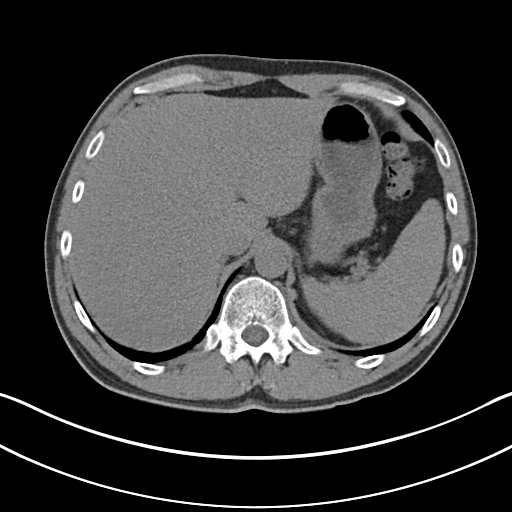
[im 90/94  soft-tissue]
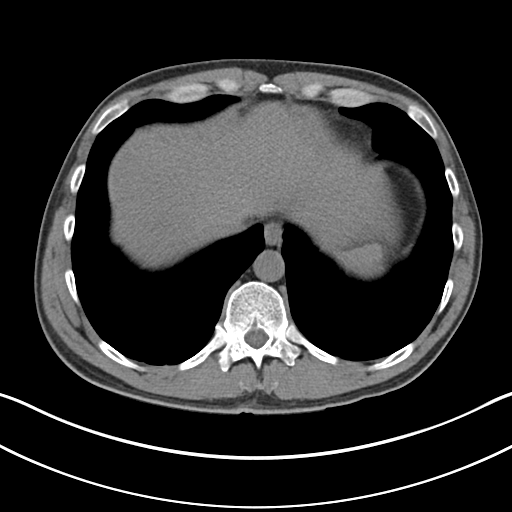

[Series 5: coronal soft tissue · coronal · 0.70mm/px · 3 of 84 slices shown]
[im 28/84  soft-tissue]
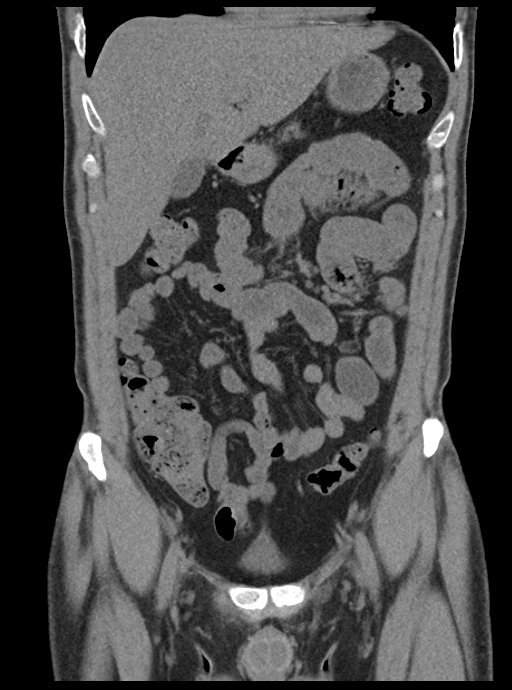
[im 37/84  soft-tissue]
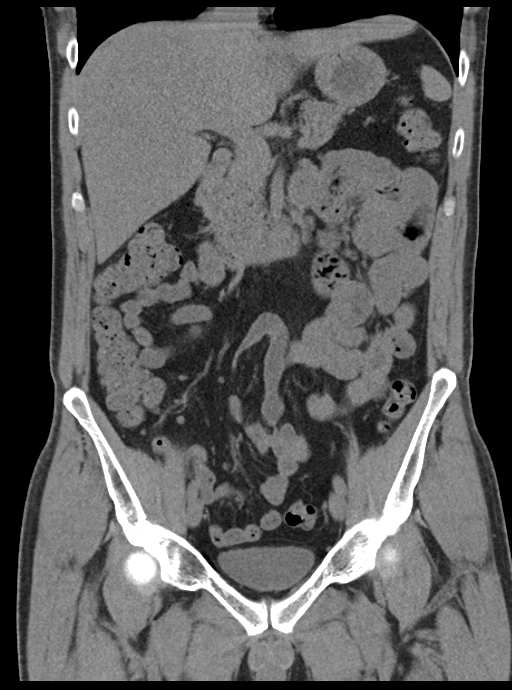
[im 47/84  soft-tissue]
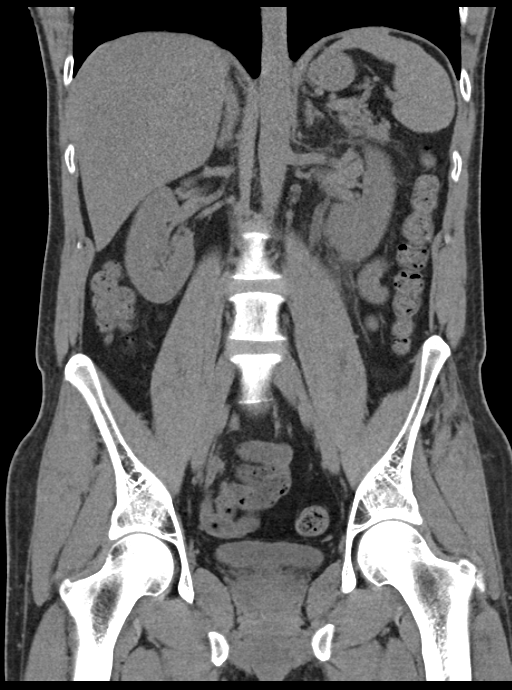

[16 of 46 positions shown; findings below may reference images not displayed]

FINDINGS: Lung bases are unremarkable. Sagittal images of the spine are
unremarkable. Unenhanced liver shows no biliary ductal dilatation.
No calcified gallstones are noted within gallbladder. Unenhanced
pancreas, spleen and adrenal glands are unremarkable. There is mild
left hydronephrosis. Tiny punctate nonobstructive calculus in lower
pole of the left kidney measures 1 mm.

Axial image 36 there is calcified obstructive calculus in proximal
left ureter measures 7 mm at the level of mid aspect of L3 vertebral
body.

No right ureteral calculi. No small bowel obstruction. Normal
retrocecal appendix. No pericecal inflammation. Terminal ileum is
unremarkable. Bilateral distal ureter is unremarkable. No calcified
calculi are noted within urinary bladder. No ascites or free air. No
adenopathy.

Prostate gland and seminal vesicles are unremarkable. No inguinal
adenopathy.
IMPRESSION: 1. There is mild left hydronephrosis and proximal left hydroureter.
2. There is 7 mm calcified obstructive calculus in proximal left
ureter at the level of L3 vertebral body.
3. Left nonobstructive nephrolithiasis.
4. No pericecal inflammation.  Normal appendix.
5. No small bowel obstruction.

## 2015-04-24 MED ORDER — OXYCODONE-ACETAMINOPHEN 5-325 MG PO TABS: 1.0000 | ORAL_TABLET | ORAL | Status: DC | PRN

## 2015-04-24 MED ORDER — KETOROLAC TROMETHAMINE 30 MG/ML IJ SOLN: 30.0000 mg | Freq: Once | INTRAMUSCULAR | Status: DC

## 2015-04-24 MED ORDER — OXYCODONE-ACETAMINOPHEN 5-325 MG PO TABS
ORAL_TABLET | ORAL | Status: AC
  Administered 2015-04-24: 1 via ORAL
  Filled 2015-04-24: qty 1

## 2015-04-24 MED ORDER — SODIUM CHLORIDE 0.9 % IV BOLUS (SEPSIS)
1000.0000 mL | Freq: Once | INTRAVENOUS | Status: AC
  Administered 2015-04-24: 1000 mL via INTRAVENOUS

## 2015-04-24 MED ORDER — ONDANSETRON HCL 4 MG/2ML IJ SOLN
4.0000 mg | Freq: Once | INTRAMUSCULAR | Status: AC
  Administered 2015-04-24: 4 mg via INTRAVENOUS
  Filled 2015-04-24: qty 2

## 2015-04-24 MED ORDER — MORPHINE SULFATE 4 MG/ML IJ SOLN
4.0000 mg | Freq: Once | INTRAMUSCULAR | Status: AC
  Administered 2015-04-24: 4 mg via INTRAVENOUS
  Filled 2015-04-24: qty 1

## 2015-04-24 MED ORDER — OXYCODONE-ACETAMINOPHEN 5-325 MG PO TABS
2.0000 | ORAL_TABLET | Freq: Once | ORAL | Status: AC
  Administered 2015-04-24: 2 via ORAL
  Filled 2015-04-24: qty 2

## 2015-04-24 MED ORDER — KETOROLAC TROMETHAMINE 30 MG/ML IJ SOLN
30.0000 mg | Freq: Once | INTRAMUSCULAR | Status: AC
  Administered 2015-04-24: 30 mg via INTRAVENOUS
  Filled 2015-04-24: qty 1

## 2015-04-24 MED ORDER — OXYCODONE-ACETAMINOPHEN 5-325 MG PO TABS
1.0000 | ORAL_TABLET | Freq: Once | ORAL | Status: AC
  Administered 2015-04-24: 1 via ORAL

## 2015-04-24 MED ORDER — ONDANSETRON 4 MG PO TBDP: 4.0000 mg | ORAL_TABLET | Freq: Once | ORAL | Status: DC

## 2015-04-24 MED ORDER — TAMSULOSIN HCL 0.4 MG PO CAPS: 0.4000 mg | ORAL_CAPSULE | Freq: Every day | ORAL | Status: DC

## 2015-04-24 MED ORDER — ONDANSETRON 4 MG PO TBDP: 4.0000 mg | ORAL_TABLET | Freq: Three times a day (TID) | ORAL | Status: DC | PRN

## 2015-04-24 NOTE — ED Provider Notes (Signed)
CSN: [id number]     Arrival date & time 04/24/15  1137 History   First MD Initiated Contact with Patient 04/24/15 [phone]     Chief Complaint  Patient presents with  . Abdominal Pain  . Flank Pain     (Consider location/radiation/quality/duration/timing/severity/associated sxs/prior Treatment) HPI Comments: Patient is a 44 yo M PMHx significant for HLD, DM, Alcohol abuse presenting to the ED for evaluation of two days of left lower quadrant pain with radiation into left flank. Patient describes pain as colicky. He had two episodes of emesis earlier with increased pain. Denies any fevers, chills, diarrhea, urinary frequency, urgency or hematuria. No abdominal surgical history.   Patient is a 44 y.o. male presenting with abdominal pain and flank pain.  Abdominal Pain Pain location:  LLQ and L flank Pain quality: aching and stabbing   Pain severity:  Moderate Onset quality:  Sudden Duration:  2 days Timing:  Intermittent Progression:  Waxing and waning Chronicity:  New Context: not eating, not previous surgeries, not recent travel and not retching   Relieved by:  None tried Worsened by:  Nothing tried Ineffective treatments:  None tried Associated symptoms: vomiting   Risk factors: no aspirin use, not elderly and not obese   Flank Pain Associated symptoms include abdominal pain and vomiting.    Past Medical History  Diagnosis Date  . Depression   . Allergy   . Hyperlipidemia   . Alcohol abuse   . Diabetes mellitus without complication 3/15    type 2   Past Surgical History  Procedure Laterality Date  . Mandible fracture surgery  2010    playing baseball   Family History  Problem Relation Age of Onset  . Diabetes Mother     type 1  . Heart disease Mother     pacemaker  . Alcohol abuse Father   . Alcohol abuse Maternal Uncle   . Diabetes Maternal Grandmother   . Alcohol abuse Maternal Grandfather   . Diabetes Maternal Grandfather    History  Substance Use Topics  .  Smoking status: Never Smoker   . Smokeless tobacco: Not on file  . Alcohol Use: Yes    Review of Systems  Gastrointestinal: Positive for vomiting and abdominal pain.  Genitourinary: Positive for flank pain.  All other systems reviewed and are negative.     Allergies  Review of patient's allergies indicates no known allergies.  Home Medications   Prior to Admission medications   Medication Sig Start Date End Date Taking? Authorizing Provider  amphetamine-dextroamphetamine (ADDERALL) 20 MG tablet Take 1 tablet (20 mg total) by mouth 2 (two) times daily. May refill in two months 02/09/15  Yes Eulas Post, MD  aspirin 81 MG tablet Take 81 mg by mouth daily.   Yes Historical Provider, MD  ibuprofen (ADVIL,MOTRIN) 200 MG tablet Take 200 mg by mouth every 6 (six) hours as needed for moderate pain.   Yes Historical Provider, MD  metFORMIN (GLUCOPHAGE) 500 MG tablet Take 2 tablets (1,000 mg total) by mouth 2 (two) times daily with a meal. 11/21/14  Yes Eulas Post, MD  Multiple Vitamin (MULTIVITAMIN WITH MINERALS) TABS tablet Take 1 tablet by mouth daily.   Yes Historical Provider, MD  sitaGLIPtin (JANUVIA) 100 MG tablet Take 1 tablet (100 mg total) by mouth daily. 11/21/14  Yes Eulas Post, MD  amphetamine-dextroamphetamine (ADDERALL) 20 MG tablet Take 1 tablet (20 mg total) by mouth 2 (two) times daily. May refill in one month Patient  not taking: Reported on 04/24/2015 02/09/15   Eulas Post, MD  amphetamine-dextroamphetamine (ADDERALL) 20 MG tablet Take 1 tablet (20 mg total) by mouth 2 (two) times daily. Patient not taking: Reported on 04/24/2015 02/09/15   Eulas Post, MD  benazepril (LOTENSIN) 5 MG tablet Take 1 tablet (5 mg total) by mouth daily. Patient not taking: Reported on 04/24/2015 05/12/14   Doe-Hyun R Shawna Orleans, DO  Disulfiram (ANTABUSE) 500 MG TABS Take 1 tablet (500 mg total) by mouth daily. Patient not taking: Reported on 04/24/2015 12/23/14   Eulas Post, MD    glucose blood (ONETOUCH VERIO) test strip 1 each by Other route 2 (two) times daily. Patient not taking: Reported on 04/24/2015 05/13/14   Eulas Post, MD  Magnesium 250 MG TABS Take 250 mg by mouth at bedtime.    Historical Provider, MD  ondansetron (ZOFRAN ODT) 4 MG disintegrating tablet Take 1 tablet (4 mg total) by mouth every 8 (eight) hours as needed for nausea. 04/24/15   Hani Patnode, PA-C  ONETOUCH DELICA LANCETS FINE MISC Use as instructed. Check once daily. DX: 250.00 Patient not taking: Reported on 04/24/2015 05/12/14   Eulas Post, MD  oxyCODONE-acetaminophen (PERCOCET/ROXICET) 5-325 MG per tablet Take 1 tablet by mouth every 4 (four) hours as needed for severe pain. May take 2 tablets PO q 6 hours for severe pain - Do not take with Tylenol as this tablet already contains tylenol 04/24/15   Baron Sane, PA-C  tamsulosin (FLOMAX) 0.4 MG CAPS capsule Take 1 capsule (0.4 mg total) by mouth daily. 04/24/15   Albina Gosney, PA-C   BP 118/84 mmHg  Pulse 71  Temp(Src) 97.4 F (36.3 C) (Oral)  Resp 18  SpO2 100% Physical Exam  Constitutional: He is oriented to person, place, and time. He appears well-developed and well-nourished. No distress.  HENT:  Head: Normocephalic and atraumatic.  Right Ear: External ear normal.  Left Ear: External ear normal.  Nose: Nose normal.  Mouth/Throat: Oropharynx is clear and moist.  Eyes: Conjunctivae are normal.  Neck: Normal range of motion. Neck supple.  No nuchal rigidity.   Cardiovascular: Normal rate, regular rhythm and normal heart sounds.   Pulmonary/Chest: Effort normal and breath sounds normal.  Abdominal: Soft. Bowel sounds are normal. There is no tenderness.  Musculoskeletal: Normal range of motion.       Thoracic back: He exhibits tenderness.       Back:  Neurological: He is alert and oriented to person, place, and time.  Skin: Skin is warm and dry. He is not diaphoretic.  Psychiatric: He has a normal mood  and affect.  Nursing note and vitals reviewed.   ED Course  Procedures (including critical care time) Medications  oxyCODONE-acetaminophen (PERCOCET/ROXICET) 5-325 MG per tablet 1 tablet (1 tablet Oral Given 04/24/15 1157)  ondansetron (ZOFRAN) injection 4 mg (4 mg Intravenous Given 04/24/15 1540)  ketorolac (TORADOL) 30 MG/ML injection 30 mg (30 mg Intravenous Given 04/24/15 1540)  sodium chloride 0.9 % bolus 1,000 mL (0 mLs Intravenous Stopped 04/24/15 1809)  morphine 4 MG/ML injection 4 mg (4 mg Intravenous Given 04/24/15 1701)  sodium chloride 0.9 % bolus 1,000 mL (0 mLs Intravenous Stopped 04/24/15 1809)  oxyCODONE-acetaminophen (PERCOCET/ROXICET) 5-325 MG per tablet 2 tablet (2 tablets Oral Given 04/24/15 1809)    Labs Review Labs Reviewed  URINALYSIS, ROUTINE W REFLEX MICROSCOPIC - Abnormal; Notable for the following:    Hgb urine dipstick MODERATE (*)    Ketones, ur >80 (*)  All other components within normal limits  URINE MICROSCOPIC-ADD ON - Abnormal; Notable for the following:    Casts HYALINE CASTS (*)    All other components within normal limits  BASIC METABOLIC PANEL - Abnormal; Notable for the following:    Chloride 99 (*)    Glucose, Bld 220 (*)    Creatinine, Ser 1.51 (*)    GFR calc non Af Amer 55 (*)    All other components within normal limits  CBC WITH DIFFERENTIAL/PLATELET - Abnormal; Notable for the following:    MCHC 36.2 (*)    All other components within normal limits    Imaging Review Ct Renal Stone Study  04/24/2015   CLINICAL DATA:  Lower abdominal pain, left flank pain, left back pain  EXAM: CT ABDOMEN AND PELVIS WITHOUT CONTRAST  TECHNIQUE: Multidetector CT imaging of the abdomen and pelvis was performed following the standard protocol without IV contrast.  COMPARISON:  04/29/2014  FINDINGS: Lung bases are unremarkable. Sagittal images of the spine are unremarkable. Unenhanced liver shows no biliary ductal dilatation. No calcified gallstones are noted within  gallbladder. Unenhanced pancreas, spleen and adrenal glands are unremarkable. There is mild left hydronephrosis. Tiny punctate nonobstructive calculus in lower pole of the left kidney measures 1 mm.  Axial image 36 there is calcified obstructive calculus in proximal left ureter measures 7 mm at the level of mid aspect of L3 vertebral body.  No right ureteral calculi. No small bowel obstruction. Normal retrocecal appendix. No pericecal inflammation. Terminal ileum is unremarkable. Bilateral distal ureter is unremarkable. No calcified calculi are noted within urinary bladder. No ascites or free air. No adenopathy.  Prostate gland and seminal vesicles are unremarkable. No inguinal adenopathy.  IMPRESSION: 1. There is mild left hydronephrosis and proximal left hydroureter. 2. There is 7 mm calcified obstructive calculus in proximal left ureter at the level of L3 vertebral body. 3. Left nonobstructive nephrolithiasis. 4. No pericecal inflammation.  Normal appendix. 5. No small bowel obstruction.   Electronically Signed   By: Lahoma Crocker M.D.   On: 04/24/2015 16:29     EKG Interpretation None      5:19 PM discussed patient with Dr. Jeffie Pollock who will see the patient on Tuesday in follow up.   MDM   Final diagnoses:  Flank pain  Nephrolithiasis    Filed Vitals:   04/24/15 1806  BP: 118/84  Pulse: 71  Temp:   Resp: 18    Afebrile, NAD, non-toxic appearing, AAOx4.  I have reviewed nursing notes, vital signs, and all appropriate lab and imaging results if ordered as above.   Pt has been diagnosed with a Kidney Stone via CT. There is no evidence of significant hydronephrosis, serum creatine mildly elevated IV bolus given, vitals sign stable and the pt does not have irratractable vomiting. No evidence of UTI. Ketones noted without evidence of metabolic acidosis on metabolic panel. Discussed patient with Dr. Jeffie Pollock who will see the patient in clinic. Pt will be dc home with pain medications. Precautions  discussed. Patient is agreeable to plan and stable at time of discharge. Patient d/w with Dr. Wyvonnia Dusky, agrees with plan.    Baron Sane, PA-C 04/25/15 0124  Ezequiel Essex, MD 04/25/15 [phone]

## 2015-04-24 NOTE — ED Notes (Signed)
Pt sts lower abd pain with pain around left flank into back; pt sts some dark urine at times

## 2015-04-24 NOTE — ED Notes (Signed)
Pt tolerating oral fluids

## 2015-04-24 NOTE — Discharge Instructions (Signed)
Please follow up with Dr. Ralene Muskrat office in the morning to schedule a follow up appointment. Please take pain medication and/or muscle relaxants as prescribed and as needed for pain. Please do not drive on narcotic pain medication or on muscle relaxants. Please read all discharge instructions and return precautions.   Kidney Stones Kidney stones (urolithiasis) are deposits that form inside your kidneys. The intense pain is caused by the stone moving through the urinary tract. When the stone moves, the ureter goes into spasm around the stone. The stone is usually passed in the urine.  CAUSES   A disorder that makes certain neck glands produce too much parathyroid hormone (primary hyperparathyroidism).  A buildup of uric acid crystals, similar to gout in your joints.  Narrowing (stricture) of the ureter.  A kidney obstruction present at birth (congenital obstruction).  Previous surgery on the kidney or ureters.  Numerous kidney infections. SYMPTOMS   Feeling sick to your stomach (nauseous).  Throwing up (vomiting).  Blood in the urine (hematuria).  Pain that usually spreads (radiates) to the groin.  Frequency or urgency of urination. DIAGNOSIS   Taking a history and physical exam.  Blood or urine tests.  CT scan.  Occasionally, an examination of the inside of the urinary bladder (cystoscopy) is performed. TREATMENT   Observation.  Increasing your fluid intake.  Extracorporeal shock wave lithotripsy--This is a noninvasive procedure that uses shock waves to break up kidney stones.  Surgery may be needed if you have severe pain or persistent obstruction. There are various surgical procedures. Most of the procedures are performed with the use of small instruments. Only small incisions are needed to accommodate these instruments, so recovery time is minimized. The size, location, and chemical composition are all important variables that will determine the proper choice of  action for you. Talk to your health care provider to better understand your situation so that you will minimize the risk of injury to yourself and your kidney.  HOME CARE INSTRUCTIONS   Drink enough water and fluids to keep your urine clear or pale yellow. This will help you to pass the stone or stone fragments.  Strain all urine through the provided strainer. Keep all particulate matter and stones for your health care provider to see. The stone causing the pain may be as small as a grain of salt. It is very important to use the strainer each and every time you pass your urine. The collection of your stone will allow your health care provider to analyze it and verify that a stone has actually passed. The stone analysis will often identify what you can do to reduce the incidence of recurrences.  Only take over-the-counter or prescription medicines for pain, discomfort, or fever as directed by your health care provider.  Make a follow-up appointment with your health care provider as directed.  Get follow-up X-rays if required. The absence of pain does not always mean that the stone has passed. It may have only stopped moving. If the urine remains completely obstructed, it can cause loss of kidney function or even complete destruction of the kidney. It is your responsibility to make sure X-rays and follow-ups are completed. Ultrasounds of the kidney can show blockages and the status of the kidney. Ultrasounds are not associated with any radiation and can be performed easily in a matter of minutes. SEEK MEDICAL CARE IF:  You experience pain that is progressive and unresponsive to any pain medicine you have been prescribed. SEEK IMMEDIATE MEDICAL CARE IF:  Pain cannot be controlled with the prescribed medicine.  You have a fever or shaking chills.  The severity or intensity of pain increases over 18 hours and is not relieved by pain medicine.  You develop a new onset of abdominal pain.  You feel  faint or pass out.  You are unable to urinate. MAKE SURE YOU:   Understand these instructions.  Will watch your condition.  Will get help right away if you are not doing well or get worse. Document Released: 12/02/2005 Document Revised: 08/04/2013 Document Reviewed: 05/05/2013 [facility] Patient Information 2015 [place], [place]. This information is not intended to replace advice given to you by your health care provider. Make sure you discuss any questions you have with your health care provider.

## 2015-04-26 ENCOUNTER — Encounter: Payer: Self-pay | Admitting: Family Medicine

## 2015-05-02 ENCOUNTER — Other Ambulatory Visit: Payer: Self-pay | Admitting: Urology

## 2015-05-03 ENCOUNTER — Encounter (HOSPITAL_COMMUNITY): Payer: Self-pay | Admitting: *Deleted

## 2015-05-07 NOTE — H&P (Signed)
History of Present Illness Consultation for left ureteral stone referred by Dr. Donnamarie Poag. PCP Dr. Elease Hashimoto.    1-left ureteral stone-about a week ago patient developed left flank pain. He was seen in the emergency department and a CT scan reveale a 7 mm left proximal ureteral stone (visible on scout, HU 540, ssd 10 cm). He was stable. His UA showed some red blood cells but no bacteria. BUN was 17, creatinine 1.51.    Today, his pain is controlled but he needs more pain medication. He also ran out of tamsulosin. He's had no fevers or chills.    He passed a 4 mm stone on the left May 2015.   Past Medical History Problems  1. History of Anxiety (F41.9) 2. History of depression (Z86.59) 3. History of diabetes mellitus (Z86.39) 4. History of renal calculi (S56.812)  Surgical History Problems  1. History of Jaw Surgery  Allergies Medication  1. No Known Drug Allergies  Family History Problems  1. Family history of cardiac disorder (Z82.49) : Mother 2. Family history of diabetes mellitus (Z83.3) : Mother  Social History Problems    Alcohol use (Z78.9)   Denied: History of Caffeine use   Never a smoker   Number of children   Occupation   Separated from significant other (Z63.5)  Review of Systems Constitutional, skin, eye, otolaryngeal, hematologic/lymphatic, cardiovascular, pulmonary, endocrine, musculoskeletal, gastrointestinal, neurological and psychiatric system(s) were reviewed and pertinent findings if present are noted and are otherwise negative.  Gastrointestinal: nausea, vomiting and constipation.  Constitutional: feeling tired (fatigue).  Integumentary: pruritus.  Eyes: blurred vision.  Neurological: headache.    Vitals Vital Signs [Data Includes: Last 1 Day]  Recorded: [id number] 01:50PM  Blood Pressure: 122 / 76 Temperature: 97.2 F Heart Rate: 79 Recorded: [id number] 01:40PM  Height: 6 ft 4 in Weight: 172 lb  BMI Calculated: 20.94 BSA  Calculated: 2.08  Physical Exam Constitutional: Well nourished and well developed . No acute distress.   ENT:. The ears and nose are normal in appearance.   Neck: The appearance of the neck is normal and no neck mass is present.   Pulmonary: No respiratory distress and normal respiratory rhythm and effort.   Cardiovascular: Heart rate and rhythm are normal . No peripheral edema.   Abdomen: The abdomen is soft and nontender. No masses are palpated. No CVA tenderness. No hernias are palpable. No hepatosplenomegaly noted.   Lymphatics: The femoral and inguinal nodes are not enlarged or tender.   Skin: Normal skin turgor, no visible rash and no visible skin lesions.   Neuro/Psych:. Mood and affect are appropriate.    Results/Data Urine [Data Includes: Last 1 Day]   [id number]  COLOR YELLOW   APPEARANCE CLEAR   SPECIFIC GRAVITY <1.005   pH 5.5   GLUCOSE 500 mg/dL  BILIRUBIN NEG   KETONE NEG mg/dL  BLOOD TRACE   PROTEIN NEG mg/dL  UROBILINOGEN 0.2 mg/dL  NITRITE NEG   LEUKOCYTE ESTERASE NEG   SQUAMOUS EPITHELIAL/HPF NONE SEEN   WBC NONE SEEN WBC/hpf  RBC NONE SEEN RBC/hpf  BACTERIA NONE SEEN   CRYSTALS NONE SEEN   CASTS NONE SEEN    Old records or history reviewed:Marland Kitchen  The following images/tracing/specimen were independently visualized: Marland Kitchen    Procedure KUB today-comparison to prior CT scans, findings: Left ureteral stone just at the L4 transverse process on the left. I didn't appreciate any other stones. The bones and the bowel gas pattern appear normal.     Assessment Assessed  1. Left ureteral stone (N20.1)  Plan Health Maintenance  1. UA With REFLEX; [Do Not Release]; Status:Complete;   Done: [id number] 01:17PM Left ureteral stone  2. Start: Oxycodone-Acetaminophen 5-325 MG Oral Tablet; TAKE 1 TO 2 TABLETS EVERY 4  TO 6 HOURS AS NEEDED FOR PAIN 3. Follow-up Schedule Surgery Office  Follow-up  Status: Complete  Done: [id number] 4. KUB; Status:Resulted - Requires  Verification;   Done: [id number] 02:27PM  Discussion/Summary Left ureteral stone - I discussed with the patient the nature risks benefits and alternatives to continued surveillance/stone passage with off label alpha-blocker use, shockwave lithotripsy or ureteroscopy. All questions answered. He elects to proceed with left ESWL. I gave him a refill of pain medication. We discussed warning signs and when to call office.      Signatures Electronically signed by : Festus Aloe, M.D.; May 01 2015  3:38PM EST

## 2015-05-08 ENCOUNTER — Encounter (HOSPITAL_COMMUNITY): Payer: Self-pay | Admitting: *Deleted

## 2015-05-08 ENCOUNTER — Ambulatory Visit (HOSPITAL_COMMUNITY)
Admission: RE | Admit: 2015-05-08 | Discharge: 2015-05-08 | Disposition: A | Discharge status: Home / Self Care | Payer: BLUE CROSS/BLUE SHIELD | Source: Ambulatory Visit | Attending: Urology | Admitting: Urology

## 2015-05-08 ENCOUNTER — Encounter (HOSPITAL_COMMUNITY)
Admission: RE | Disposition: A | Discharge status: Home / Self Care | Payer: Self-pay | Source: Ambulatory Visit | Attending: Urology

## 2015-05-08 ENCOUNTER — Telehealth: Payer: Self-pay | Admitting: Family Medicine

## 2015-05-08 ENCOUNTER — Ambulatory Visit (HOSPITAL_COMMUNITY): Payer: BLUE CROSS/BLUE SHIELD

## 2015-05-08 DIAGNOSIS — N201 Calculus of ureter: Secondary | ICD-10-CM

## 2015-05-08 DIAGNOSIS — F329 Major depressive disorder, single episode, unspecified: Secondary | ICD-10-CM | POA: Diagnosis not present

## 2015-05-08 DIAGNOSIS — E119 Type 2 diabetes mellitus without complications: Secondary | ICD-10-CM | POA: Diagnosis not present

## 2015-05-08 DIAGNOSIS — F419 Anxiety disorder, unspecified: Secondary | ICD-10-CM | POA: Insufficient documentation

## 2015-05-08 HISTORY — PX: EXTRACORPOREAL SHOCK WAVE LITHOTRIPSY: SHX1557

## 2015-05-08 HISTORY — DX: Calculus of kidney: N20.0

## 2015-05-08 LAB — GLUCOSE, CAPILLARY: Glucose-Capillary: 122 mg/dL — ABNORMAL HIGH (ref 65–99)

## 2015-05-08 IMAGING — CR DG ABDOMEN 1V
2 series · 2 of 2 positions shown · non-contrast
Comparison: [DATE], [DATE]

CLINICAL DATA: Left-sided ureteral stone

EXAM:
ABDOMEN - 1 VIEW

[t abdomen supine (1 of 2)]
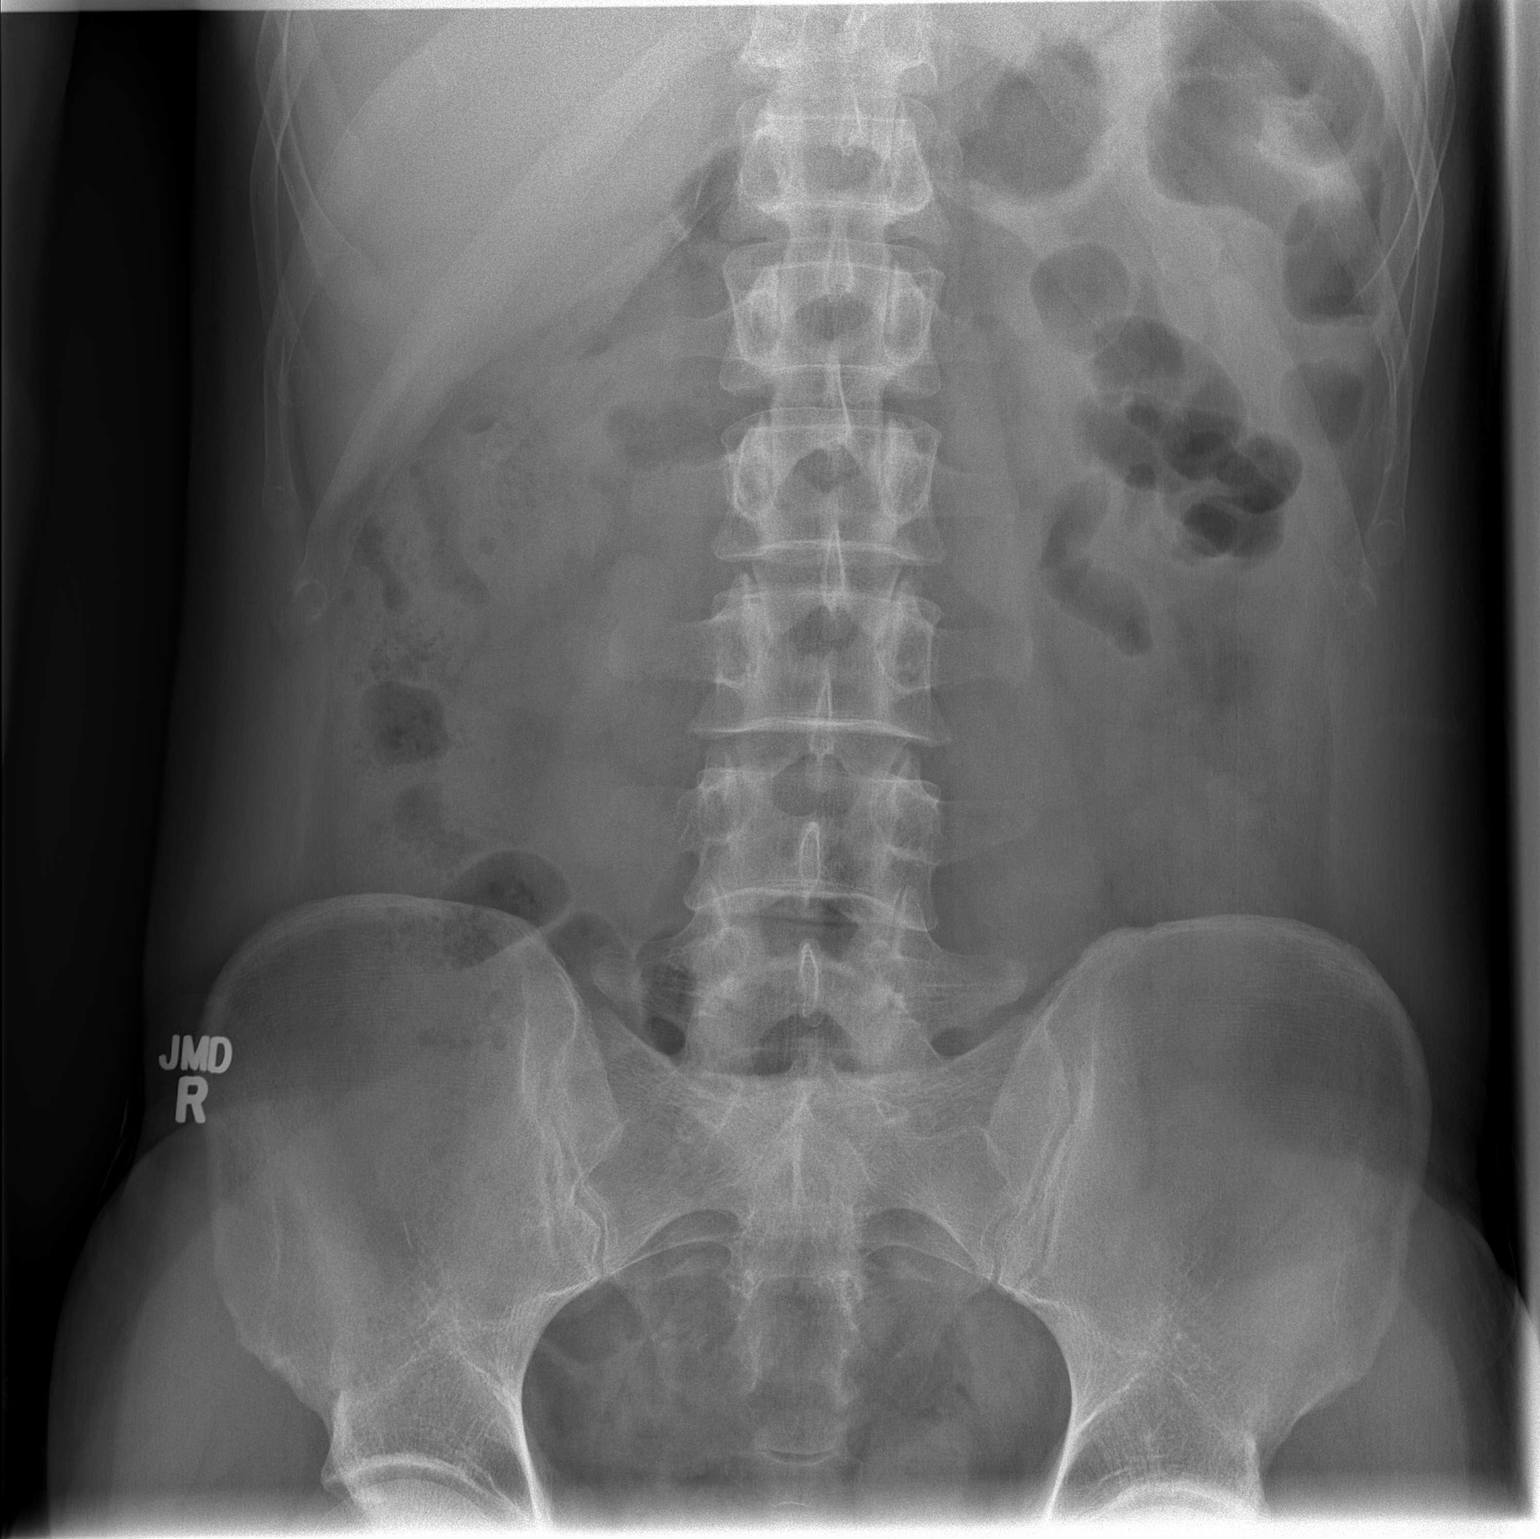

[t abdomen supine (2 of 2)]
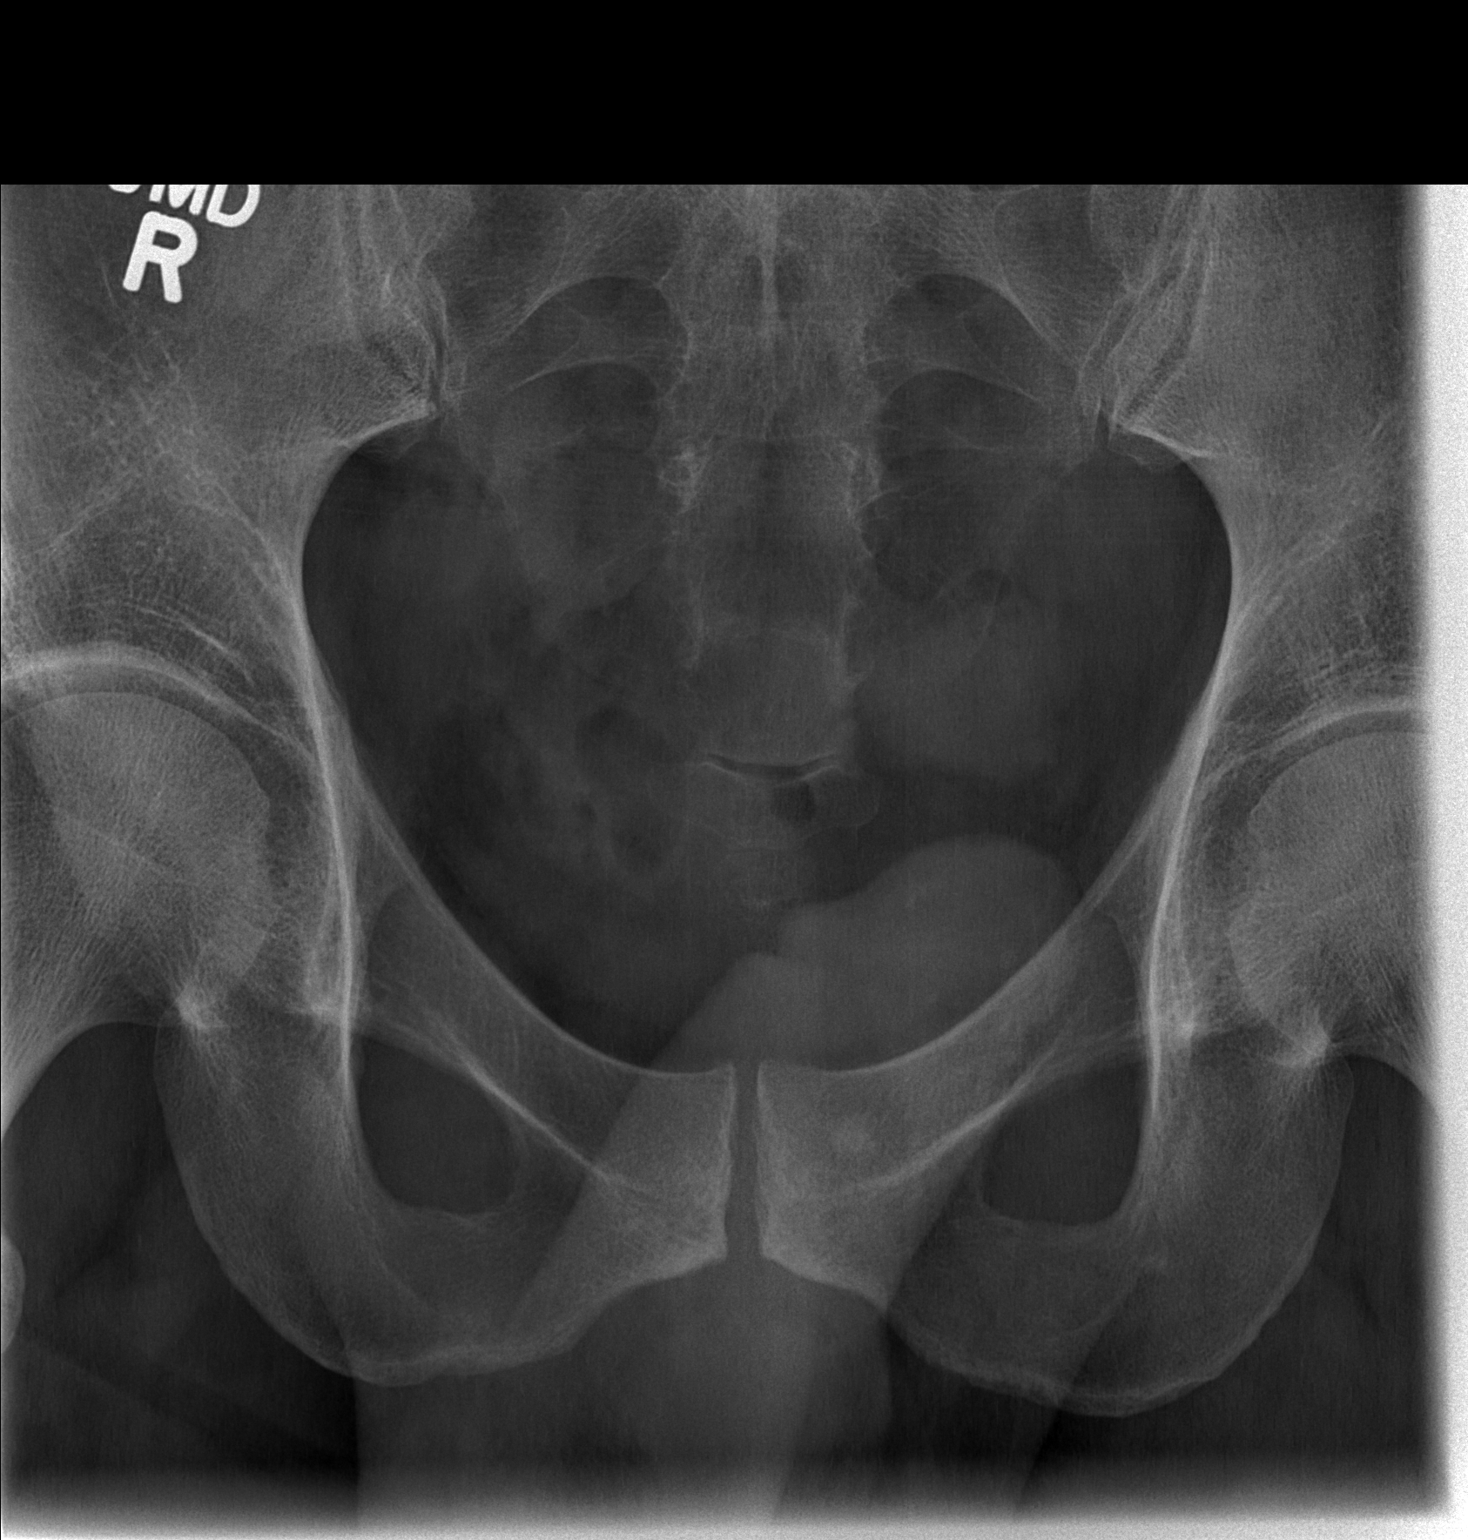

[2 of 2 positions shown; findings below may reference images not displayed]

FINDINGS: The previously seen left ureteral stone is not as well appreciated
as on the prior exams. There are questionable small calcifications
in the expected region of the distal left ureter which may be
related to the previously seen ureteral stone. Clinical correlation
is recommended.
IMPRESSION: The left ureteral stone is not as well appreciated on the current
exam. Some suggestion of distal ureteral calculi is noted.

## 2015-05-08 SURGERY — LITHOTRIPSY, ESWL
Anesthesia: LOCAL | Laterality: Left

## 2015-05-08 MED ORDER — DIAZEPAM 5 MG PO TABS
10.0000 mg | ORAL_TABLET | ORAL | Status: AC
  Administered 2015-05-08: 10 mg via ORAL
  Filled 2015-05-08: qty 2

## 2015-05-08 MED ORDER — DIPHENHYDRAMINE HCL 25 MG PO CAPS
25.0000 mg | ORAL_CAPSULE | ORAL | Status: AC
  Administered 2015-05-08: 25 mg via ORAL
  Filled 2015-05-08: qty 1

## 2015-05-08 MED ORDER — SODIUM CHLORIDE 0.9 % IV SOLN
INTRAVENOUS | Status: DC
  Administered 2015-05-08: 15:00:00 via INTRAVENOUS

## 2015-05-08 MED ORDER — HYDROCODONE-ACETAMINOPHEN 5-325 MG PO TABS: 1.0000 | ORAL_TABLET | Freq: Four times a day (QID) | ORAL | Status: DC | PRN

## 2015-05-08 MED ORDER — CIPROFLOXACIN HCL 500 MG PO TABS
500.0000 mg | ORAL_TABLET | ORAL | Status: AC
  Administered 2015-05-08: 500 mg via ORAL
  Filled 2015-05-08: qty 1

## 2015-05-08 MED ORDER — TAMSULOSIN HCL 0.4 MG PO CAPS: 0.4000 mg | ORAL_CAPSULE | Freq: Every day | ORAL | Status: DC

## 2015-05-08 NOTE — Telephone Encounter (Signed)
Pt requesting refill of amphetamine-dextroamphetamine (ADDERALL) 20 MG tablet.  Please call Amber at [phone] when ready for pick up as pt will be having surgery for kidney stone this afternoon.

## 2015-05-08 NOTE — Discharge Instructions (Signed)
Kidney Stones Kidney stones (urolithiasis) are solid masses that form inside your kidneys. The intense pain is caused by the stone moving through the kidney, ureter, bladder, and urethra (urinary tract). When the stone moves, the ureter starts to spasm around the stone. The stone is usually passed in your pee (urine).  HOME CARE  Drink enough fluids to keep your pee clear or pale yellow. This helps to get the stone out.  Strain all pee through the provided strainer. Do not pee without peeing through the strainer, not even once. If you pee the stone out, catch it in the strainer. The stone may be as small as a grain of salt. Take this to your doctor. This will help your doctor figure out what you can do to try to prevent more kidney stones.  Only take medicine as told by your doctor.  Follow up with your doctor as told.  Get follow-up X-rays as told by your doctor. GET HELP IF: You have pain that gets worse even if you have been taking pain medicine. GET HELP RIGHT AWAY IF:   Your pain does not get better with medicine.  You have a fever or shaking chills.  Your pain increases and gets worse over 18 hours.  You have new belly (abdominal) pain.  You feel faint or pass out.  You are unable to pee. MAKE SURE YOU:   Understand these instructions.  Will watch your condition.  Will get help right away if you are not doing well or get worse. Document Released: 05/20/2008 Document Revised: 08/04/2013 Document Reviewed: 05/05/2013

## 2015-05-08 NOTE — Telephone Encounter (Signed)
Pt had positive drug screen recently  Missed last appt and we need to discuss. Cannot prescribe any further controlled medications at this time.

## 2015-05-08 NOTE — Interval H&P Note (Signed)
History and Physical Interval Note:  05/08/2015 3:27 PM  Carlos [name]  has presented today for surgery, with the diagnosis of left ureteral stone  The various methods of treatment have been discussed with the patient and family. After consideration of risks, benefits and other options for treatment, the patient has consented to  Procedure(s): LEFT EXTRACORPOREAL SHOCK WAVE LITHOTRIPSY (ESWL) (Left) as a surgical intervention .  The patient's history has been reviewed, patient examined, no change in status, stable for surgery.  I have reviewed the patient's chart and labs.  Questions were answered to the patient's satisfaction.  KUB shows stone may be at left uvj or in bladder. Pt had left flank pain last night and voided about "15 times". He did not see stone pass. No dysuria or fever.    Nikodem Leadbetter

## 2015-05-08 NOTE — Op Note (Signed)
Attempted left ESWL (flouro scout only) - no treatment, stone note visualized  Left proximal ureteral stone

## 2015-05-08 NOTE — Telephone Encounter (Signed)
Last visit 09/26/14 Last refill 02/09/15 #60 2 refills

## 2015-05-08 NOTE — Progress Notes (Signed)
Lithotripsy not performed. Reviewed instructions with patient and girlfriend. Prescriptions given. Aware of when to call MD. Escorted to car at 1620.

## 2015-05-09 NOTE — Telephone Encounter (Signed)
Can you please call patient for appointment.

## 2015-05-09 NOTE — Telephone Encounter (Signed)
Called and spoke to patient. Made patient aware MD Burchette will not be prescribing his Adderall at this time and MD Burchette is requesting patient to schedule appointment to talk further. Patient complied and has appointment this Thursday at 9:30am.

## 2015-05-11 ENCOUNTER — Ambulatory Visit: Payer: BLUE CROSS/BLUE SHIELD | Admitting: Family Medicine

## 2015-05-14 ENCOUNTER — Emergency Department (HOSPITAL_BASED_OUTPATIENT_CLINIC_OR_DEPARTMENT_OTHER): Payer: BLUE CROSS/BLUE SHIELD

## 2015-05-14 ENCOUNTER — Emergency Department (HOSPITAL_BASED_OUTPATIENT_CLINIC_OR_DEPARTMENT_OTHER)
Admission: EM | Admit: 2015-05-14 | Discharge: 2015-05-14 | Disposition: A | Discharge status: Home / Self Care | Payer: BLUE CROSS/BLUE SHIELD | Attending: Emergency Medicine | Admitting: Emergency Medicine

## 2015-05-14 ENCOUNTER — Encounter (HOSPITAL_BASED_OUTPATIENT_CLINIC_OR_DEPARTMENT_OTHER): Payer: Self-pay | Admitting: Emergency Medicine

## 2015-05-14 DIAGNOSIS — K5900 Constipation, unspecified: Secondary | ICD-10-CM | POA: Diagnosis present

## 2015-05-14 DIAGNOSIS — K5903 Drug induced constipation: Secondary | ICD-10-CM

## 2015-05-14 DIAGNOSIS — Z79899 Other long term (current) drug therapy: Secondary | ICD-10-CM | POA: Insufficient documentation

## 2015-05-14 DIAGNOSIS — E119 Type 2 diabetes mellitus without complications: Secondary | ICD-10-CM | POA: Diagnosis not present

## 2015-05-14 DIAGNOSIS — F909 Attention-deficit hyperactivity disorder, unspecified type: Secondary | ICD-10-CM | POA: Diagnosis not present

## 2015-05-14 DIAGNOSIS — Z7982 Long term (current) use of aspirin: Secondary | ICD-10-CM | POA: Insufficient documentation

## 2015-05-14 DIAGNOSIS — Z87442 Personal history of urinary calculi: Secondary | ICD-10-CM | POA: Diagnosis not present

## 2015-05-14 IMAGING — DX DG ABDOMEN 1V
2 series · 2 of 2 positions shown · non-contrast
Comparison: Radiographs [DATE].  CT [DATE]

CLINICAL DATA: Constipation for 2 weeks. Increasing over the last
few days.

EXAM:
ABDOMEN - 1 VIEW

[abdomen supine (1 of 2)]
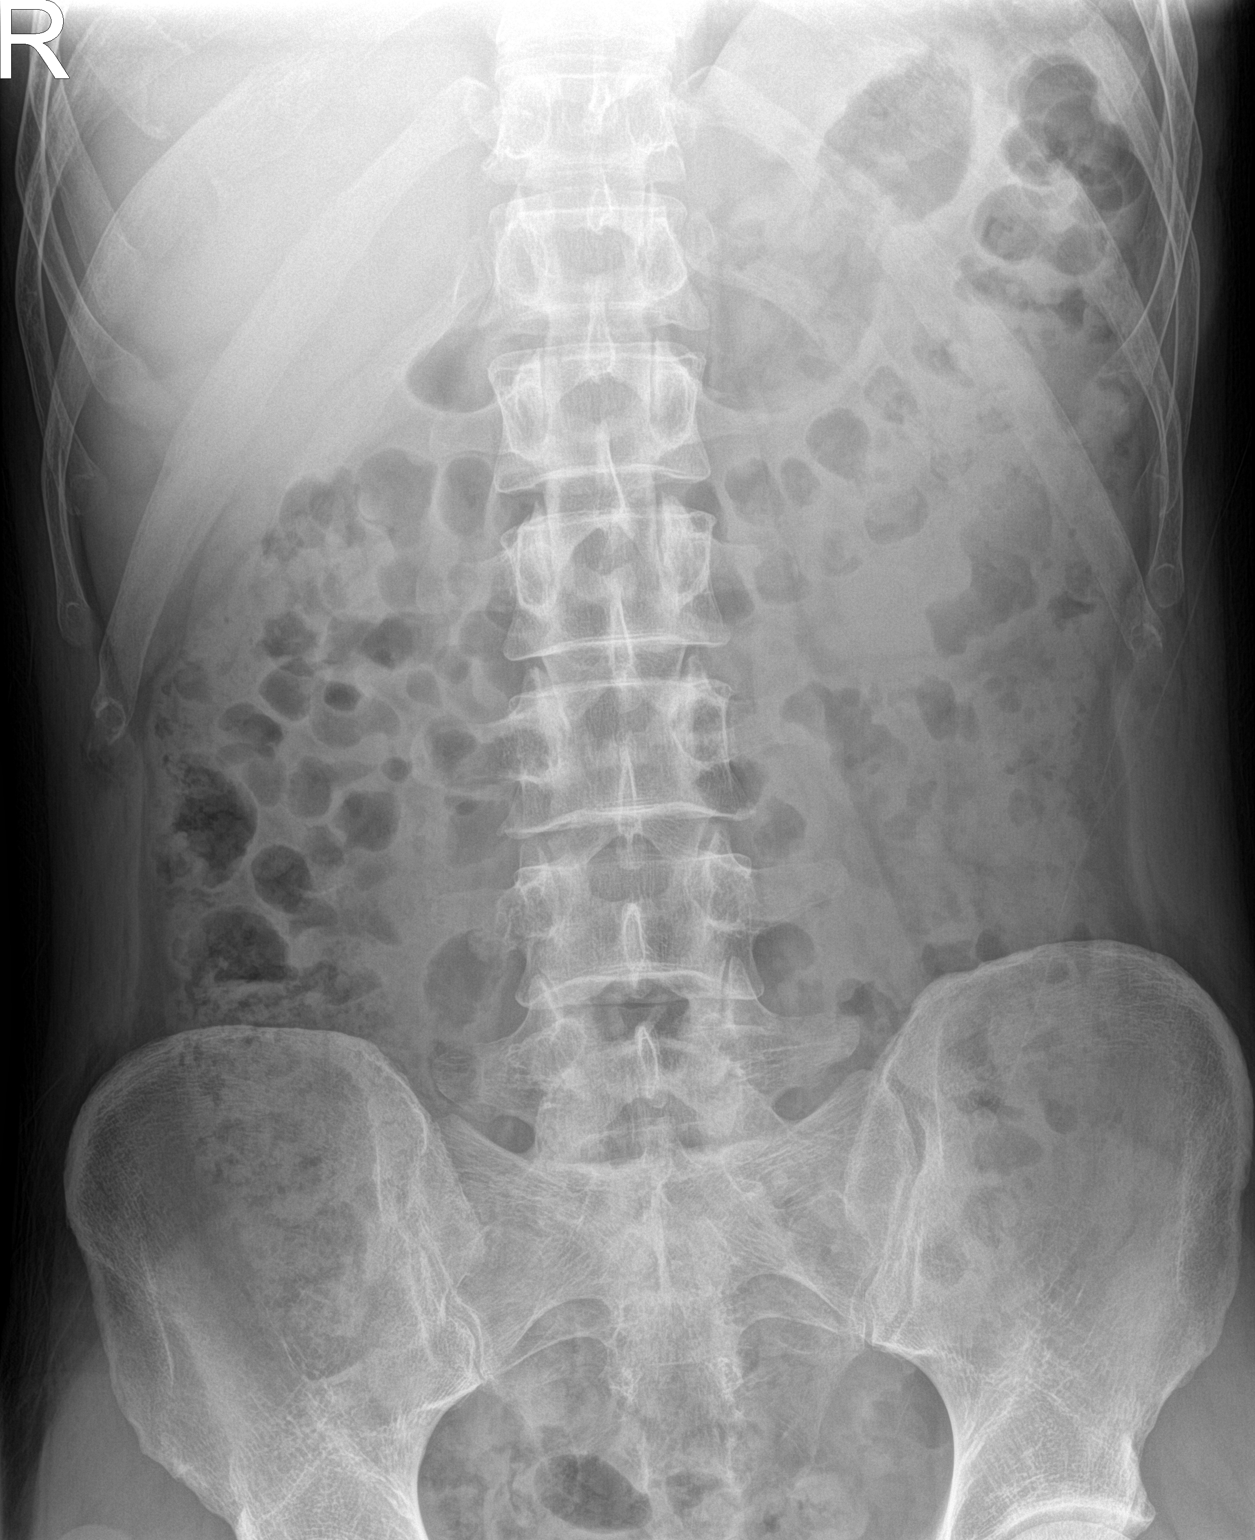

[abdomen supine (2 of 2)]
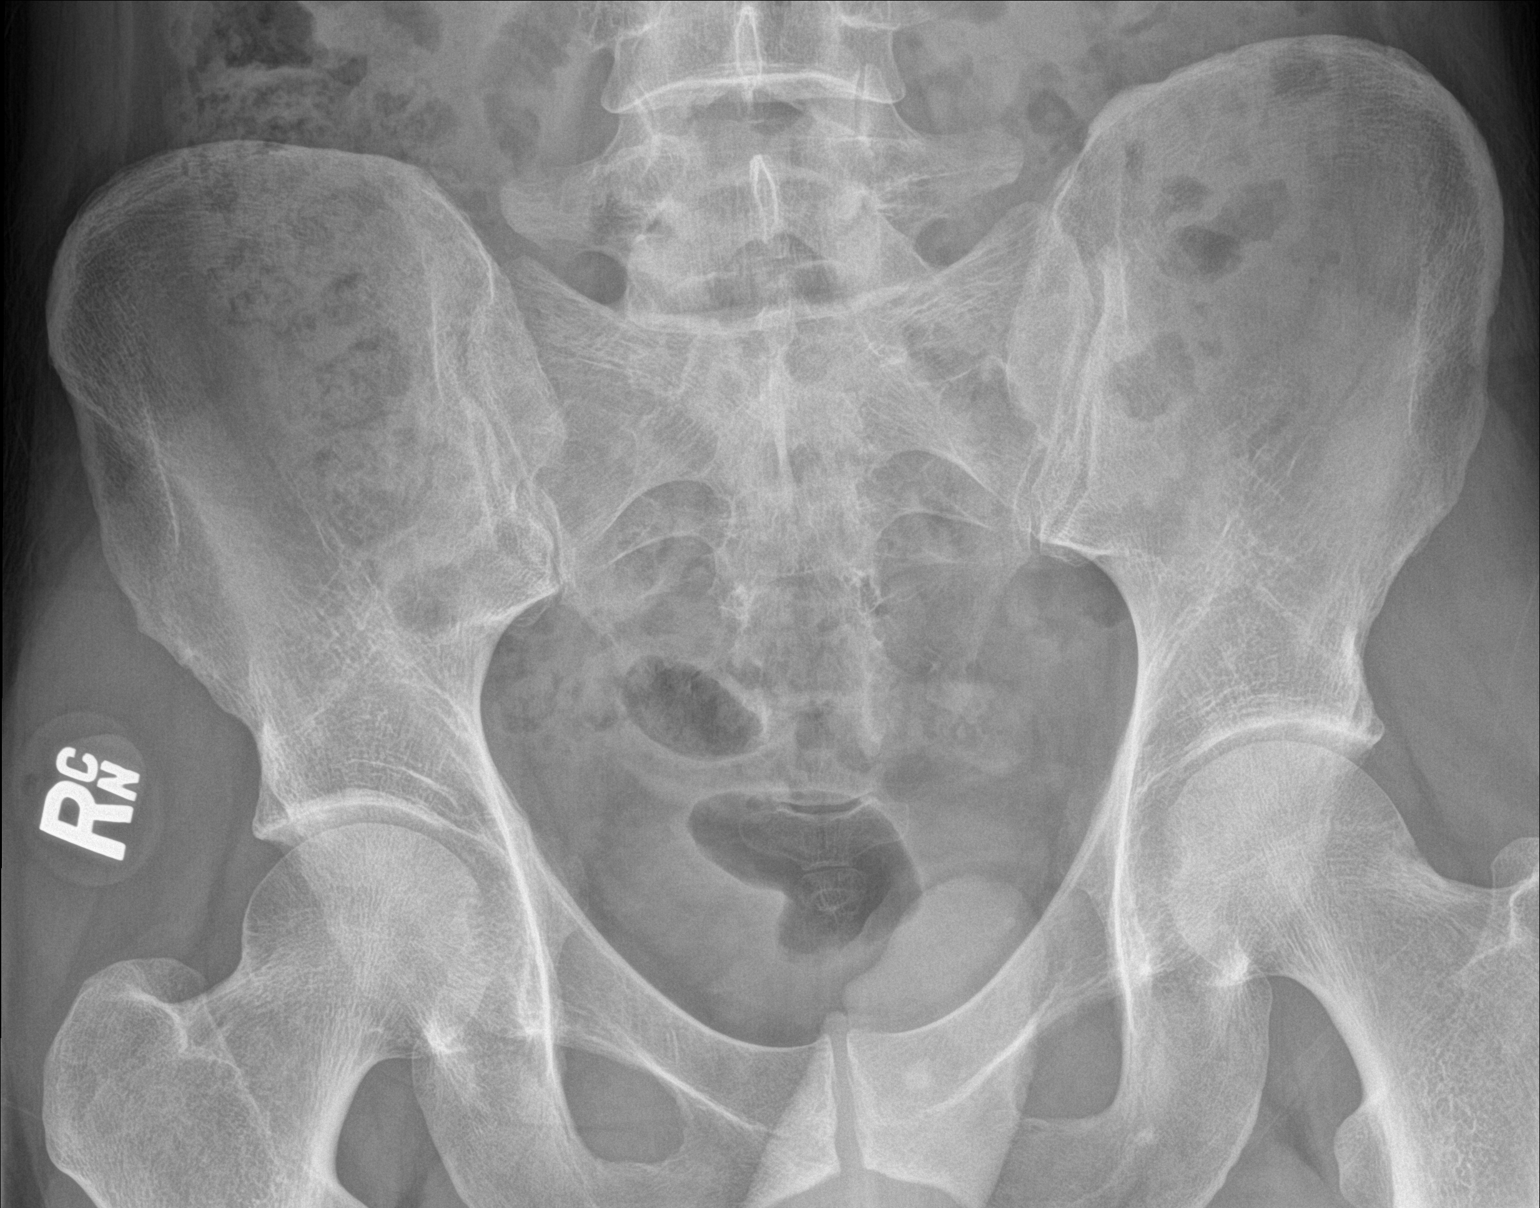

[2 of 2 positions shown; findings below may reference images not displayed]

FINDINGS: Moderate stool in the right colon, small volume of stool throughout
the remainder the colon. There is increased air throughout normal
caliber small bowel loops in the central abdomen. No dilated bowel
loops. No evidence of free intra-abdominal air. Calcifications in
the pelvis on prior radiograph are not definitively seen. There is a
probable left-sided phlebolith that is unchanged. Osseous structures
are unchanged.
IMPRESSION: 1. Small to moderate volume of colonic stool.
2. Mildly increased air throughout normal caliber small bowel loops
throughout the abdomen, may reflect enteritis or ileus. No findings
to suggest obstruction.
3. The small calcifications in the left pelvis on prior exam are not
definitively seen.

## 2015-05-14 MED ORDER — FLEET ENEMA 7-19 GM/118ML RE ENEM
1.0000 | ENEMA | Freq: Every day | RECTAL | Status: DC | PRN
Start: 2015-05-14 — End: 2015-05-23

## 2015-05-14 MED ORDER — DOCUSATE SODIUM 100 MG PO CAPS: 100.0000 mg | ORAL_CAPSULE | Freq: Two times a day (BID) | ORAL | Status: DC

## 2015-05-14 MED ORDER — FLEET ENEMA 7-19 GM/118ML RE ENEM: 1.0000 | ENEMA | Freq: Once | RECTAL | Status: DC

## 2015-05-14 NOTE — ED Provider Notes (Signed)
CSN: [id number]     Arrival date & time 05/14/15  2054 History   First MD Initiated Contact with Patient 05/14/15 2110     Chief Complaint  Patient presents with  . Constipation     (Consider location/radiation/quality/duration/timing/severity/associated sxs/prior Treatment) HPI Comments: 44 year old male complaining of constipation 2 weeks, worsening over the past few days. States he has been on Percocet since 04/25/2015 for kidney stones, and is having difficulty having a bowel movement. Reports he is only passing a small amount of liquidy stool if he takes MiraLAX and uses stool softeners. Tried using an enema today with only mild relief. Reports today, he pushed so hard that he got a hemorrhoid popped out. No history of hemorrhoids. Reports some discomfort to his rectal area. States if he does not take the Percocet, he still develops some left-sided back pain. He was seen by the urologist about a week ago and they were unable to do lithotripsy as they were not able to visualize the stone that he was diagnosed with. Denies abdominal pain, nausea, vomiting or appetite change. No history of abdominal surgeries.  Patient is a 44 y.o. male presenting with constipation. The history is provided by the patient.  Constipation   Past Medical History  Diagnosis Date  . Depression   . Allergy   . Hyperlipidemia   . Alcohol abuse   . Diabetes mellitus without complication 4/40    type 2  . Kidney stones    Past Surgical History  Procedure Laterality Date  . Mandible fracture surgery  2010    playing baseball  . Cardiac catheterization      6 years ago   Family History  Problem Relation Age of Onset  . Diabetes Mother     type 1  . Heart disease Mother     pacemaker  . Alcohol abuse Father   . Alcohol abuse Maternal Uncle   . Diabetes Maternal Grandmother   . Alcohol abuse Maternal Grandfather   . Diabetes Maternal Grandfather    History  Substance Use Topics  . Smoking status:  Never Smoker   . Smokeless tobacco: Not on file  . Alcohol Use: Yes     Comment: casual    Review of Systems  Gastrointestinal: Positive for constipation and rectal pain.  All other systems reviewed and are negative.     Allergies  Review of patient's allergies indicates no known allergies.  Home Medications   Prior to Admission medications   Medication Sig Start Date End Date Taking? Authorizing Provider  amphetamine-dextroamphetamine (ADDERALL) 20 MG tablet Take 1 tablet (20 mg total) by mouth 2 (two) times daily. May refill in two months 02/09/15   Eulas Post, MD  aspirin 81 MG tablet Take 81 mg by mouth every morning.     Historical Provider, MD  bisacodyl (DULCOLAX) 5 MG EC tablet Take 5 mg by mouth daily as needed for mild constipation or moderate constipation.    Historical Provider, MD  docusate sodium (COLACE) 100 MG capsule Take 100 mg by mouth 2 (two) times daily.    Historical Provider, MD  docusate sodium (COLACE) 100 MG capsule Take 1 capsule (100 mg total) by mouth every 12 (twelve) hours. 05/14/15   Carman Ching, PA-C  HYDROcodone-acetaminophen (NORCO/VICODIN) 5-325 MG per tablet Take 1 tablet by mouth every 6 (six) hours as needed for moderate pain. 05/08/15   Festus Aloe, MD  ibuprofen (ADVIL,MOTRIN) 200 MG tablet Take 200 mg by mouth every 6 (six)  hours as needed for moderate pain.    Historical Provider, MD  metFORMIN (GLUCOPHAGE) 500 MG tablet Take 2 tablets (1,000 mg total) by mouth 2 (two) times daily with a meal. 11/21/14   Eulas Post, MD  Multiple Vitamin (MULTIVITAMIN WITH MINERALS) TABS tablet Take 1 tablet by mouth every morning.     Historical Provider, MD  oxyCODONE-acetaminophen (PERCOCET/ROXICET) 5-325 MG per tablet Take 1 tablet by mouth every 4 (four) hours as needed for severe pain. May take 2 tablets PO q 6 hours for severe pain - Do not take with Tylenol as this tablet already contains tylenol 04/24/15   Anderson Malta Piepenbrink, PA-C   sitaGLIPtin (JANUVIA) 100 MG tablet Take 1 tablet (100 mg total) by mouth daily. 11/21/14   Eulas Post, MD  sodium phosphate (FLEET) 7-19 GM/118ML ENEM Place 133 mLs (1 enema total) rectally daily as needed for severe constipation. 05/14/15   Carman Ching, PA-C  tamsulosin (FLOMAX) 0.4 MG CAPS capsule Take 1 capsule (0.4 mg total) by mouth daily. 04/24/15   Jennifer Piepenbrink, PA-C  tamsulosin (FLOMAX) 0.4 MG CAPS capsule Take 1 capsule (0.4 mg total) by mouth daily after supper. 05/08/15   Festus Aloe, MD   BP 150/94 mmHg  Pulse 74  Temp(Src) 98.4 F (36.9 C) (Oral)  Resp 18  Ht 6' 4"  (1.93 m)  Wt 170 lb (77.111 kg)  BMI 20.70 kg/m2  SpO2 100% Physical Exam  Constitutional: He is oriented to person, place, and time. He appears well-developed and well-nourished. No distress.  HENT:  Head: Normocephalic and atraumatic.  Eyes: Conjunctivae and EOM are normal.  Neck: Normal range of motion. Neck supple.  Cardiovascular: Normal rate, regular rhythm and normal heart sounds.   Pulmonary/Chest: Effort normal and breath sounds normal.  Abdominal: Soft. Bowel sounds are normal. He exhibits no distension and no mass. There is no tenderness. There is no rebound and no guarding.  Genitourinary: Rectal exam shows no external hemorrhoid, no internal hemorrhoid and no fissure.  No stool palpated in the rectal vault.  Musculoskeletal: Normal range of motion. He exhibits no edema.  Neurological: He is alert and oriented to person, place, and time.  Skin: Skin is warm and dry.  Psychiatric: He has a normal mood and affect. His behavior is normal.  Nursing note and vitals reviewed.   ED Course  Procedures (including critical care time) Labs Review Labs Reviewed - No data to display  Imaging Review Dg Abd 1 View  05/14/2015   CLINICAL DATA:  Constipation for 2 weeks. Increasing over the last few days.  EXAM: ABDOMEN - 1 VIEW  COMPARISON:  Radiographs 05/08/2015.  CT 04/24/2015   FINDINGS: Moderate stool in the right colon, small volume of stool throughout the remainder the colon. There is increased air throughout normal caliber small bowel loops in the central abdomen. No dilated bowel loops. No evidence of free intra-abdominal air. Calcifications in the pelvis on prior radiograph are not definitively seen. There is a probable left-sided phlebolith that is unchanged. Osseous structures are unchanged.  IMPRESSION: 1. Small to moderate volume of colonic stool. 2. Mildly increased air throughout normal caliber small bowel loops throughout the abdomen, may reflect enteritis or ileus. No findings to suggest obstruction. 3. The small calcifications in the left pelvis on prior exam are not definitively seen.   Electronically Signed   By: Jeb Levering M.D.   On: 05/14/2015 22:01     EKG Interpretation None      MDM  Final diagnoses:  Constipation due to pain medication   Nontoxic appearing, NAD. AF VSS. Abdomen is soft and nontender. No stool palpated in the rectal vault. X-ray showing small to moderate volume of colonic stool, possible enteritis or ileus, no findings to suggest obstruction. It is possible he is developing an ileus due to the narcotic pain medication. I discussed this with the patient and told him that the less he uses the narcotics, less constipated he will be. Advised Fleet enema along with Colace, and increased fiber in diet. Follow-up with PCP. Stable for discharge. Return precautions given. Pt states understanding.  Carman Ching, PA-C 05/14/15 2227  Evelina Bucy, MD 05/14/15 [phone]

## 2015-05-14 NOTE — ED Notes (Signed)
Pt in c/o constipation x 2 weeks but worse in the last few days, taking pain meds for kidney stone. Has been trying OTC laxatives and enemas with no relief.

## 2015-05-14 NOTE — Discharge Instructions (Signed)
Use stool softeners as directed and take enema daily as needed for constipation. The less you use the narcotic pain medication, the less likely you'll be constipated. Follow-up with your primary care physician. Increase the fiber in your diet.  Constipation Constipation is when a person has fewer than three bowel movements a week, has difficulty having a bowel movement, or has stools that are dry, hard, or larger than normal. As people grow older, constipation is more common. If you try to fix constipation with medicines that make you have a bowel movement (laxatives), the problem may get worse. Long-term laxative use may cause the muscles of the colon to become weak. A low-fiber diet, not taking in enough fluids, and taking certain medicines may make constipation worse.  CAUSES   Certain medicines, such as antidepressants, pain medicine, iron supplements, antacids, and water pills.   Certain diseases, such as diabetes, irritable bowel syndrome (IBS), thyroid disease, or depression.   Not drinking enough water.   Not eating enough fiber-rich foods.   Stress or travel.   Lack of physical activity or exercise.   Ignoring the urge to have a bowel movement.   Using laxatives too much.  SIGNS AND SYMPTOMS   Having fewer than three bowel movements a week.   Straining to have a bowel movement.   Having stools that are hard, dry, or larger than normal.   Feeling full or bloated.   Pain in the lower abdomen.   Not feeling relief after having a bowel movement.  DIAGNOSIS  Your health care provider will take a medical history and perform a physical exam. Further testing may be done for severe constipation. Some tests may include:  A barium enema X-ray to examine your rectum, colon, and, sometimes, your small intestine.   A sigmoidoscopy to examine your lower colon.   A colonoscopy to examine your entire colon. TREATMENT  Treatment will depend on the severity of your  constipation and what is causing it. Some dietary treatments include drinking more fluids and eating more fiber-rich foods. Lifestyle treatments may include regular exercise. If these diet and lifestyle recommendations do not help, your health care provider may recommend taking over-the-counter laxative medicines to help you have bowel movements. Prescription medicines may be prescribed if over-the-counter medicines do not work.  HOME CARE INSTRUCTIONS   Eat foods that have a lot of fiber, such as fruits, vegetables, whole grains, and beans.  Limit foods high in fat and processed sugars, such as french fries, hamburgers, cookies, candies, and soda.   A fiber supplement may be added to your diet if you cannot get enough fiber from foods.   Drink enough fluids to keep your urine clear or pale yellow.   Exercise regularly or as directed by your health care provider.   Go to the restroom when you have the urge to go. Do not hold it.   Only take over-the-counter or prescription medicines as directed by your health care provider. Do not take other medicines for constipation without talking to your health care provider first.  [place] IF:   You have bright red blood in your stool.   Your constipation lasts for more than 4 days or gets worse.   You have abdominal or rectal pain.   You have thin, pencil-like stools.   You have unexplained weight loss. MAKE SURE YOU:   Understand these instructions.  Will watch your condition.  Will get help right away if you are not doing well  or get worse. Document Released: 08/30/2004 Document Revised: 12/07/2013 Document Reviewed: 09/13/2013 [facility] Patient Information 2015 [place], [place]. This information is not intended to replace advice given to you by your health care provider. Make sure you discuss any questions you have with your health care provider. High-Fiber Diet Fiber is found in fruits, vegetables, and  grains. A high-fiber diet encourages the addition of more whole grains, legumes, fruits, and vegetables in your diet. The recommended amount of fiber for adult males is 38 g per day. For adult females, it is 25 g per day. Pregnant and lactating women should get 28 g of fiber per day. If you have a digestive or bowel problem, ask your caregiver for advice before adding high-fiber foods to your diet. Eat a variety of high-fiber foods instead of only a select few type of foods.  PURPOSE  To increase stool bulk.  To make bowel movements more regular to prevent constipation.  To lower cholesterol.  To prevent overeating. WHEN IS THIS DIET USED?  It may be used if you have constipation and hemorrhoids.  It may be used if you have uncomplicated diverticulosis (intestine condition) and irritable bowel syndrome.  It may be used if you need help with weight management.  It may be used if you want to add it to your diet as a protective measure against atherosclerosis, diabetes, and cancer. SOURCES OF FIBER  Whole-grain breads and cereals.  Fruits, such as apples, oranges, bananas, berries, prunes, and pears.  Vegetables, such as green peas, carrots, sweet potatoes, beets, broccoli, cabbage, spinach, and artichokes.  Legumes, such split peas, soy, lentils.  Almonds. FIBER CONTENT IN FOODS Starches and Grains / Dietary Fiber (g)  Cheerios, 1 cup / 3 g  Corn Flakes cereal, 1 cup / 0.7 g  Rice crispy treat cereal, 1 cup / 0.3 g  Instant oatmeal (cooked),  cup / 2 g  Frosted wheat cereal, 1 cup / 5.1 g  Brown, long-grain rice (cooked), 1 cup / 3.5 g  White, long-grain rice (cooked), 1 cup / 0.6 g  Enriched macaroni (cooked), 1 cup / 2.5 g Legumes / Dietary Fiber (g)  Baked beans (canned, plain, or vegetarian),  cup / 5.2 g  Kidney beans (canned),  cup / 6.8 g  Pinto beans (cooked),  cup / 5.5 g Breads and Crackers / Dietary Fiber (g)  Plain or honey graham crackers, 2  squares / 0.7 g  Saltine crackers, 3 squares / 0.3 g  Plain, salted pretzels, 10 pieces / 1.8 g  Whole-wheat bread, 1 slice / 1.9 g  White bread, 1 slice / 0.7 g  Raisin bread, 1 slice / 1.2 g  Plain bagel, 3 oz / 2 g  Flour tortilla, 1 oz / 0.9 g  Corn tortilla, 1 small / 1.5 g  Hamburger or hotdog bun, 1 small / 0.9 g Fruits / Dietary Fiber (g)  Apple with skin, 1 medium / 4.4 g  Sweetened applesauce,  cup / 1.5 g  Banana,  medium / 1.5 g  Grapes, 10 grapes / 0.4 g  Orange, 1 small / 2.3 g  Raisin, 1.5 oz / 1.6 g  Melon, 1 cup / 1.4 g Vegetables / Dietary Fiber (g)  Green beans (canned),  cup / 1.3 g  Carrots (cooked),  cup / 2.3 g  Broccoli (cooked),  cup / 2.8 g  Peas (cooked),  cup / 4.4 g  Mashed potatoes,  cup / 1.6 g  Lettuce, 1 cup /  0.5 g  Corn (canned),  cup / 1.6 g  Tomato,  cup / 1.1 g Document Released: 12/02/2005 Document Revised: 06/02/2012 Document Reviewed: 03/05/2012 [facility] Patient Information 2015 [place], [place]. This information is not intended to replace advice given to you by your health care provider. Make sure you discuss any questions you have with your health care provider.

## 2015-05-16 ENCOUNTER — Telehealth: Payer: Self-pay | Admitting: Family Medicine

## 2015-05-16 MED ORDER — METFORMIN HCL 500 MG PO TABS: 1000.0000 mg | ORAL_TABLET | Freq: Two times a day (BID) | ORAL | Status: DC

## 2015-05-16 NOTE — Telephone Encounter (Signed)
Patient is request re-fill on metFORMIN (GLUCOPHAGE) 500 MG tablet sent [facility] on [address].

## 2015-05-16 NOTE — Telephone Encounter (Signed)
Rx sent to pharmacy   

## 2015-05-17 ENCOUNTER — Ambulatory Visit (HOSPITAL_COMMUNITY)
Admission: RE | Admit: 2015-05-17 | Discharge: 2015-05-17 | Disposition: A | Discharge status: Home / Self Care | Payer: BLUE CROSS/BLUE SHIELD | Source: Ambulatory Visit | Attending: Urology | Admitting: Urology

## 2015-05-17 ENCOUNTER — Encounter (HOSPITAL_COMMUNITY)
Admission: RE | Disposition: A | Discharge status: Home / Self Care | Payer: Self-pay | Source: Ambulatory Visit | Attending: Urology

## 2015-05-17 ENCOUNTER — Ambulatory Visit (HOSPITAL_COMMUNITY): Payer: BLUE CROSS/BLUE SHIELD

## 2015-05-17 ENCOUNTER — Ambulatory Visit (HOSPITAL_BASED_OUTPATIENT_CLINIC_OR_DEPARTMENT_OTHER): Payer: BLUE CROSS/BLUE SHIELD | Admitting: Certified Registered"

## 2015-05-17 ENCOUNTER — Other Ambulatory Visit: Payer: Self-pay | Admitting: Urology

## 2015-05-17 ENCOUNTER — Encounter (HOSPITAL_COMMUNITY): Payer: Self-pay | Admitting: *Deleted

## 2015-05-17 DIAGNOSIS — N201 Calculus of ureter: Secondary | ICD-10-CM

## 2015-05-17 DIAGNOSIS — N3289 Other specified disorders of bladder: Secondary | ICD-10-CM | POA: Insufficient documentation

## 2015-05-17 DIAGNOSIS — N132 Hydronephrosis with renal and ureteral calculous obstruction: Secondary | ICD-10-CM | POA: Diagnosis not present

## 2015-05-17 DIAGNOSIS — N359 Urethral stricture, unspecified: Secondary | ICD-10-CM | POA: Insufficient documentation

## 2015-05-17 DIAGNOSIS — E119 Type 2 diabetes mellitus without complications: Secondary | ICD-10-CM | POA: Diagnosis not present

## 2015-05-17 DIAGNOSIS — Z7982 Long term (current) use of aspirin: Secondary | ICD-10-CM | POA: Insufficient documentation

## 2015-05-17 DIAGNOSIS — Z79891 Long term (current) use of opiate analgesic: Secondary | ICD-10-CM | POA: Insufficient documentation

## 2015-05-17 DIAGNOSIS — Z79899 Other long term (current) drug therapy: Secondary | ICD-10-CM | POA: Insufficient documentation

## 2015-05-17 HISTORY — DX: Personal history of other medical treatment: Z92.89

## 2015-05-17 HISTORY — PX: CYSTOSCOPY/URETEROSCOPY/HOLMIUM LASER/STENT PLACEMENT: SHX6546

## 2015-05-17 HISTORY — DX: Personal history of urinary calculi: Z87.442

## 2015-05-17 HISTORY — DX: Attention-deficit hyperactivity disorder, unspecified type: F90.9

## 2015-05-17 HISTORY — DX: Type 2 diabetes mellitus without complications: E11.9

## 2015-05-17 HISTORY — PX: CYSTOSCOPY W/ RETROGRADES: SHX1426

## 2015-05-17 HISTORY — DX: Calculus of ureter: N20.1

## 2015-05-17 LAB — GLUCOSE, CAPILLARY
Glucose-Capillary: 161 mg/dL — ABNORMAL HIGH (ref 65–99)
Glucose-Capillary: 134 mg/dL — ABNORMAL HIGH (ref 65–99)

## 2015-05-17 SURGERY — CYSTOSCOPY/URETEROSCOPY/HOLMIUM LASER/STENT PLACEMENT
Anesthesia: General | Site: Ureter | Laterality: Left

## 2015-05-17 MED ORDER — MIDAZOLAM HCL 5 MG/5ML IJ SOLN
INTRAMUSCULAR | Status: DC | PRN
  Administered 2015-05-17: 2 mg via INTRAVENOUS

## 2015-05-17 MED ORDER — KETOROLAC TROMETHAMINE 30 MG/ML IJ SOLN
INTRAMUSCULAR | Status: DC | PRN
  Administered 2015-05-17: 30 mg via INTRAVENOUS

## 2015-05-17 MED ORDER — FENTANYL CITRATE (PF) 100 MCG/2ML IJ SOLN
INTRAMUSCULAR | Status: DC | PRN
  Administered 2015-05-17 (×4): 50 ug via INTRAVENOUS

## 2015-05-17 MED ORDER — ONDANSETRON HCL 4 MG/2ML IJ SOLN
INTRAMUSCULAR | Status: DC | PRN
  Administered 2015-05-17: 4 mg via INTRAVENOUS

## 2015-05-17 MED ORDER — PROMETHAZINE HCL 25 MG/ML IJ SOLN: 6.2500 mg | INTRAMUSCULAR | Status: DC | PRN

## 2015-05-17 MED ORDER — FENTANYL CITRATE (PF) 100 MCG/2ML IJ SOLN
INTRAMUSCULAR | Status: AC
  Filled 2015-05-17: qty 2

## 2015-05-17 MED ORDER — LACTATED RINGERS IV SOLN
INTRAVENOUS | Status: DC
  Administered 2015-05-17 (×2): via INTRAVENOUS

## 2015-05-17 MED ORDER — SODIUM CHLORIDE 0.9 % IR SOLN
Status: DC | PRN
  Administered 2015-05-17: 4000 mL

## 2015-05-17 MED ORDER — OXYCODONE-ACETAMINOPHEN 5-325 MG PO TABS
ORAL_TABLET | ORAL | Status: AC
  Filled 2015-05-17: qty 1

## 2015-05-17 MED ORDER — SUCCINYLCHOLINE CHLORIDE 20 MG/ML IJ SOLN
INTRAMUSCULAR | Status: DC | PRN
  Administered 2015-05-17: 100 mg via INTRAVENOUS

## 2015-05-17 MED ORDER — LIDOCAINE HCL (CARDIAC) 20 MG/ML IV SOLN
INTRAVENOUS | Status: DC | PRN
  Administered 2015-05-17: 100 mg via INTRAVENOUS

## 2015-05-17 MED ORDER — PROPOFOL 10 MG/ML IV BOLUS
INTRAVENOUS | Status: DC | PRN
  Administered 2015-05-17: 160 mg via INTRAVENOUS

## 2015-05-17 MED ORDER — MIDAZOLAM HCL 2 MG/2ML IJ SOLN
INTRAMUSCULAR | Status: AC
  Filled 2015-05-17: qty 2

## 2015-05-17 MED ORDER — CEFAZOLIN SODIUM-DEXTROSE 2-3 GM-% IV SOLR
2.0000 g | INTRAVENOUS | Status: AC
  Administered 2015-05-17: 2 g via INTRAVENOUS

## 2015-05-17 MED ORDER — IOHEXOL 350 MG/ML SOLN
INTRAVENOUS | Status: DC | PRN
  Administered 2015-05-17: 10 mL via URETHRAL

## 2015-05-17 MED ORDER — CEFAZOLIN SODIUM-DEXTROSE 2-3 GM-% IV SOLR
INTRAVENOUS | Status: AC
  Filled 2015-05-17: qty 50

## 2015-05-17 MED ORDER — FENTANYL CITRATE (PF) 100 MCG/2ML IJ SOLN
25.0000 ug | INTRAMUSCULAR | Status: DC | PRN
  Administered 2015-05-17 (×2): 50 ug via INTRAVENOUS

## 2015-05-17 MED ORDER — OXYCODONE-ACETAMINOPHEN 5-325 MG PO TABS
1.0000 | ORAL_TABLET | Freq: Four times a day (QID) | ORAL | Status: DC | PRN
  Administered 2015-05-17: 1 via ORAL

## 2015-05-17 MED ORDER — CEFAZOLIN SODIUM 1-5 GM-% IV SOLN: 1.0000 g | INTRAVENOUS | Status: DC

## 2015-05-17 MED ORDER — FENTANYL CITRATE (PF) 100 MCG/2ML IJ SOLN
INTRAMUSCULAR | Status: AC
  Filled 2015-05-17: qty 6

## 2015-05-17 SURGICAL SUPPLY — 41 items
ADAPTER CATH URET PLST 4-6FR (CATHETERS) IMPLANT
ADPR CATH URET STRL DISP 4-6FR (CATHETERS)
BAG DRAIN URO-CYSTO SKYTR STRL (DRAIN) ×2 IMPLANT
BAG DRN UROCATH (DRAIN) ×1
BASKET LASER NITINOL 1.9FR (BASKET) IMPLANT
BASKET STNLS GEMINI 4WIRE 3FR (BASKET) IMPLANT
BASKET ZERO TIP NITINOL 2.4FR (BASKET) ×2 IMPLANT
BSKT STON RTRVL 120 1.9FR (BASKET)
BSKT STON RTRVL GEM 120X11 3FR (BASKET)
BSKT STON RTRVL ZERO TP 2.4FR (BASKET) ×1
CANISTER SUCT LVC 12 LTR MEDI- (MISCELLANEOUS) ×2 IMPLANT
CATH INTERMIT  6FR 70CM (CATHETERS) IMPLANT
CATH URET 5FR 28IN CONE TIP (BALLOONS)
CATH URET 5FR 28IN OPEN ENDED (CATHETERS) ×2 IMPLANT
CATH URET 5FR 70CM CONE TIP (BALLOONS) IMPLANT
CATH URET DUAL LUMEN 6-10FR 50 (CATHETERS) IMPLANT
CLOTH BEACON ORANGE TIMEOUT ST (SAFETY) ×2 IMPLANT
ELECT REM PT RETURN 9FT ADLT (ELECTROSURGICAL)
ELECTRODE REM PT RTRN 9FT ADLT (ELECTROSURGICAL) IMPLANT
FIBER LASER FLEXIVA 200 (UROLOGICAL SUPPLIES) ×2 IMPLANT
GLOVE BIO SURGEON STRL SZ 6.5 (GLOVE) ×1 IMPLANT
GLOVE BIO SURGEON STRL SZ7.5 (GLOVE) ×2 IMPLANT
GLOVE INDICATOR 6.5 STRL GRN (GLOVE) ×4 IMPLANT
GOWN STRL REUS W/ TWL LRG LVL3 (GOWN DISPOSABLE) ×1 IMPLANT
GOWN STRL REUS W/ TWL XL LVL3 (GOWN DISPOSABLE) ×1 IMPLANT
GOWN STRL REUS W/TWL LRG LVL3 (GOWN DISPOSABLE) ×2
GOWN STRL REUS W/TWL XL LVL3 (GOWN DISPOSABLE) ×2
GUIDEWIRE 0.038 PTFE COATED (WIRE) IMPLANT
GUIDEWIRE ANG ZIPWIRE 038X150 (WIRE) ×1 IMPLANT
GUIDEWIRE STR DUAL SENSOR (WIRE) ×2 IMPLANT
IV NS 1000ML (IV SOLUTION) ×2
IV NS 1000ML BAXH (IV SOLUTION) IMPLANT
IV NS IRRIG 3000ML ARTHROMATIC (IV SOLUTION) ×2 IMPLANT
KIT BALLIN UROMAX 15FX10 (LABEL) IMPLANT
KIT BALLN UROMAX 15FX4 (MISCELLANEOUS) IMPLANT
KIT BALLN UROMAX 26 75X4 (MISCELLANEOUS)
PACK CYSTO (CUSTOM PROCEDURE TRAY) ×2 IMPLANT
SET HIGH PRES BAL DIL (LABEL)
SHEATH ACCESS URETERAL 38CM (SHEATH) IMPLANT
STENT URET 6FRX28 CONTOUR (STENTS) ×1 IMPLANT
SYRINGE IRR TOOMEY STRL 70CC (SYRINGE) IMPLANT

## 2015-05-17 NOTE — Discharge Instructions (Signed)
Ureteral Stent Implantation, Care After Refer to this sheet in the next few weeks. These instructions provide you with information on caring for yourself after your procedure. Your health care provider may also give you more specific instructions. Your treatment has been planned according to current medical practices, but problems sometimes occur. Call your health care provider if you have any problems or questions after your procedure. WHAT TO EXPECT AFTER THE PROCEDURE You should be back to normal activity within 48 hours after the procedure. Nausea and vomiting may occur and are commonly the result of anesthesia. It is common to experience sharp pain in the back or lower abdomen and penis with voiding. This is caused by movement of the ends of the stent with the act of urinating.It usually goes away within minutes after you have stopped urinating.  HOME CARE INSTRUCTIONS Make sure to drink plenty of fluids. You may have small amounts of bleeding, causing your urine to be red. This is normal. Certain movements may trigger pain or a feeling that you need to urinate. You may be given medicines to prevent infection or bladder spasms. Be sure to take all medicines as directed. Only take over-the-counter or prescription medicines for pain, discomfort, or fever as directed by your health care provider. Do not take aspirin, as this can make bleeding worse.  REMOVAL OF THE STENT: Remove the stent by pulling the string was slow steady pressure on Monday morning 05/22/2015.   Be sure to keep follow-up appointment for renal ultrasound in 6 weeks to ensure your healing properly.  SEEK MEDICAL CARE IF:  You experience increasing pain.  Your pain medicine is not working. SEEK IMMEDIATE MEDICAL CARE IF:  Your urine is dark red or has blood clots.  You are leaking urine (incontinent).  You have a fever, chills, feeling sick to your stomach (nausea), or vomiting.  Your pain is not relieved by pain  medicine.  The end of the stent comes out of the urethra.  You are unable to urinate.   Post Anesthesia Home Care Instructions  Activity: Get plenty of rest for the remainder of the day. A responsible adult should stay with you for 24 hours following the procedure.  For the next 24 hours, DO NOT: -Drive a car -Paediatric nurse -Drink alcoholic beverages -Take any medication unless instructed by your physician -Make any legal decisions or sign important papers.  Meals: Start with liquid foods such as gelatin or soup. Progress to regular foods as tolerated. Avoid greasy, spicy, heavy foods. If nausea and/or vomiting occur, drink only clear liquids until the nausea and/or vomiting subsides. Call your physician if vomiting continues.  Special Instructions/Symptoms: Your throat may feel dry or sore from the anesthesia or the breathing tube placed in your throat during surgery. If this causes discomfort, gargle with warm salt water. The discomfort should disappear within 24 hours.  If you had a scopolamine patch placed behind your ear for the management of post- operative nausea and/or vomiting:  1. The medication in the patch is effective for 72 hours, after which it should be removed.  Wrap patch in a tissue and discard in the trash. Wash hands thoroughly with soap and water. 2. You may remove the patch earlier than 72 hours if you experience unpleasant side effects which may include dry mouth, dizziness or visual disturbances. 3. Avoid touching the patch. Wash your hands with soap and water after contact with the patch.

## 2015-05-17 NOTE — Interval H&P Note (Signed)
History and Physical Interval Note:  05/17/2015 1:19 PM  Carlos [name]  has presented today for surgery, with the diagnosis of LEFT URETERAL STONE  The various methods of treatment have been discussed with the patient and family. After consideration of risks, benefits and other options for treatment, the patient has consented to  Procedure(s): LEFT URETEROSCOPY/HOLMIUM LASER/STENT PLACEMENT (Left) as a surgical intervention .  The patient's history has been reviewed, patient examined, no change in status, stable for surgery.  I have reviewed the patient's chart and labs.  Questions were answered to the patient's satisfaction.  He has been well, just continued LLQ pain. Discussed possible need for staged procedure/pre-stent, etc.    Carnelius Hammitt

## 2015-05-17 NOTE — Anesthesia Postprocedure Evaluation (Signed)
  Anesthesia Post-op Note  Patient: Carlos [name]  Procedure(s) Performed: Procedure(s) (LRB): LEFT URETEROSCOPY/HOLMIUM LASER/STENT PLACEMENT (Left) CYSTOSCOPY WITH RETROGRADE PYELOGRAM (Left)  Patient Location: PACU  Anesthesia Type: General  Level of Consciousness: awake and alert   Airway and Oxygen Therapy: Patient Spontanous Breathing  Post-op Pain: mild  Post-op Assessment: Post-op Vital signs reviewed, Patient's Cardiovascular Status Stable, Respiratory Function Stable, Patent Airway and No signs of Nausea or vomiting  Last Vitals:  Filed Vitals:   05/17/15 1515  BP: 150/103  Pulse: 68  Temp:   Resp: 11    Post-op Vital Signs: stable   Complications: No apparent anesthesia complications

## 2015-05-17 NOTE — Anesthesia Procedure Notes (Signed)
Procedure Name: Intubation Date/Time: 05/17/2015 1:27 PM Performed by: Denna Haggard D Pre-anesthesia Checklist: Patient identified, Emergency Drugs available, Suction available and Patient being monitored Patient Re-evaluated:Patient Re-evaluated prior to inductionOxygen Delivery Method: Circle System Utilized Preoxygenation: Pre-oxygenation with 100% oxygen Intubation Type: IV induction and Rapid sequence Ventilation: Mask ventilation without difficulty Laryngoscope Size: Mac and 4 Grade View: Grade I Tube type: Oral Tube size: 8.0 mm Number of attempts: 1 Airway Equipment and Method: Stylet and Oral airway Placement Confirmation: ETT inserted through vocal cords under direct vision,  positive ETCO2 and breath sounds checked- equal and bilateral Secured at: 23 cm Tube secured with: Tape Dental Injury: Teeth and Oropharynx as per pre-operative assessment

## 2015-05-17 NOTE — Op Note (Signed)
Preoperative diagnosis: Left distal ureteral stone Postoperative diagnosis: Urethral stricture, left distal ureteral stone  Procedure: Cystoscopy, left retrograde pyelogram, left ureteroscopy with holmium laser lithotripsy, stone basket extraction and left ureteral stent placement-6 x 28 cm double-J with string  Surgeon: Junious Silk  Anesthesia: Gen.  Findings: On cystoscopy the urethra was normal apart from a thin bulb urethral stricture about a centimeter to in front of the membranous urethra which was dilated with the scope. Prostate appeared normal. Trigone and ureteral orifices appeared normal although the left ureteral orifice appeared a bit edematous and erythematous. There were no stones in the bladder. The bladder mucosa was normal.  Left retrograde pyelogram outlined a filling defect in the left distal ureter consistent with the stone. The stone was faintly visible on the scout. The ureter and collecting system showed moderate hydroureteronephrosis without other filling defect. There was a single ureter single collecting system unit.  On left ureteroscopy the stone was noted in the left distal ureter. It was fragmented in the fragments dropped in the bladder. No other significant fragments remained there was no ureteral injury on final inspection.  Description of procedure: After consent was obtained patient brought to the operating room. After adequate anesthesia he is placed in lithotomy position and prepped and draped in the usual sterile fashion. A timeout was performed to confirm the patient and procedure. Cystoscope was passed per urethra and a 6 [place] open-ended ureteral catheter was inserted through the left ureteral orifice and left retrograde injection of contrast was performed. A sensor wire was then advanced and coiled in the collecting system. There is brisk efflux of dark urine. A weighted to the collecting system decompressed. This was drained into a cup inspected. It was light  pink in nature and clear. I did send for culture as a precaution but it did not look to be purulent. The semirigid single-channel ureteroscope was then advanced where the stone was noted. It was long and and therefore thought it might come through the ureteral orifice. It was grasped but 2 large come through the orifice. It was dropped back in the distal ureter. 200  laser fiber was advanced. Stone was fragmented into small pieces at setting of 0.5 and 20. Pieces were sequentially removed with the Nitinol basket and dropped in the bladder. I was able to inspect up toward the iliacs in view up into the proximal ureter noted no other fragments are ureteral injury. There was some dust in the distal ureter but it was too small to be gathered into the basket. The scope was removed and a 6 x 27 m stent was advanced over the wire. The wire was removed with a good coil seen in the collecting system and a good coil in the bladder. The bladder was drained and the scope removed. Patient was awakened taken to recovery room in stable condition.  Complication: None  Blood loss: Minimal  Drains: 6 x 28 cm left ureteral stent with string  Specimens: Stone fragment office lab  Disposition: Patient stable to PACU

## 2015-05-17 NOTE — Anesthesia Preprocedure Evaluation (Addendum)
Anesthesia Evaluation  Patient identified by MRN, date of birth, ID band Patient awake    Reviewed: Allergy & Precautions, NPO status , Patient's Chart, lab work & pertinent test results  Airway Mallampati: II  TM Distance: >3 FB Neck ROM: Full    Dental no notable dental hx.    Pulmonary neg pulmonary ROS,  breath sounds clear to auscultation  Pulmonary exam normal       Cardiovascular Exercise Tolerance: Good negative cardio ROS Normal cardiovascular examRhythm:Regular Rate:Normal  ECHO 2009: normal  Stress test 2014 normal.   Neuro/Psych PSYCHIATRIC DISORDERS Depression negative neurological ROS     GI/Hepatic negative GI ROS, Neg liver ROS,   Endo/Other  diabetes, Type 2, Oral Hypoglycemic Agents  Renal/GU Renal disease  negative genitourinary   Musculoskeletal negative musculoskeletal ROS (+)   Abdominal   Peds negative pediatric ROS (+)  Hematology negative hematology ROS (+)   Anesthesia Other Findings   Reproductive/Obstetrics negative OB ROS                           Anesthesia Physical Anesthesia Plan  ASA: II  Anesthesia Plan: General   Post-op Pain Management:    Induction: Intravenous  Airway Management Planned: Oral ETT  Additional Equipment:   Intra-op Plan:   Post-operative Plan: Extubation in OR  Informed Consent: I have reviewed the patients History and Physical, chart, labs and discussed the procedure including the risks, benefits and alternatives for the proposed anesthesia with the patient or authorized representative who has indicated his/her understanding and acceptance.   Dental advisory given  Plan Discussed with: CRNA  Anesthesia Plan Comments:         Anesthesia Quick Evaluation

## 2015-05-17 NOTE — OR Nursing (Signed)
Left ureteral stone taking br Dr. Junious Silk.

## 2015-05-17 NOTE — Transfer of Care (Signed)
Immediate Anesthesia Transfer of Care Note  Patient: Carlos [name]  Procedure(s) Performed: Procedure(s) (LRB): LEFT URETEROSCOPY/HOLMIUM LASER/STENT PLACEMENT (Left) CYSTOSCOPY WITH RETROGRADE PYELOGRAM (Left)  Patient Location: PACU  Anesthesia Type: General  Level of Consciousness: awake, oriented, sedated and patient cooperative  Airway & Oxygen Therapy: Patient Spontanous Breathing and Patient connected to face mask oxygen  Post-op Assessment: Report given to PACU RN and Post -op Vital signs reviewed and stable  Post vital signs: Reviewed and stable  Complications: No apparent anesthesia complications

## 2015-05-17 NOTE — H&P (Signed)
History of Present Illness F/u - PCP Dr. Elease Hashimoto.    1-left ureteral stone-about a week ago Carlos [name] developed left flank pain. He was seen in the emergency department and a CT scan reveale a 7 mm left proximal ureteral stone (visible on scout, HU 540, ssd 10 cm). He was stable. His UA showed some red blood cells but no bacteria. BUN was 17, creatinine 1.51. He passed a 4 mm stone on the left May 2015.    May 2016 interval history  Carlos [name] returns and continued management of a 7 mm left ureteral stone. He was taken for shockwave lithotripsy last week but the stone could not be visualized on fluoroscopy or KUB.     Today he has pain in the left lower quadrant causing frequency, bladder pain. The pain is sharp. UA shows no abnormalities. He went to [facility] [place] over the weekend with constipation. Repeat plain x-ray showed no definitive stones but pelvic phleboliths. I reviewed the image. Check magnesium citrate and an enema and the constipation is resolved.    We discussed some imaging options such as proceeding ASAP for cystoscopy left retrograde, repeat CT, or left renal ultrasound. All questions answered. Carlos [name] said he needs to know what is going on so that he can make a plan for work in for ureteroscopy if needed. He elected to proceed with CT scan.   Past Medical History Problems  1. History of Anxiety (F41.9) 2. History of depression (Z86.59) 3. History of diabetes mellitus (Z86.39) 4. History of renal calculi (K02.542)  Surgical History Problems  1. History of Jaw Surgery  Current Meds 1. Aspirin 81 MG TABS;  Therapy: (Recorded:16May2016) to Recorded 2. Flomax 0.4 MG CP24;  Therapy: (Recorded:16May2016) to Recorded 3. Januvia TABS;  Therapy: (Recorded:16May2016) to Recorded 4. MetFORMIN HCl - 1000 MG Oral Tablet;  Therapy: (Recorded:16May2016) to Recorded 5. Multi-Vitamin TABS;  Therapy: (Recorded:16May2016) to Recorded 6. Oxycodone-Acetaminophen 5-325 MG  Oral Tablet; TAKE 1 TO 2 TABLETS EVERY 4 TO 6  HOURS AS NEEDED FOR PAIN;  Therapy: [id number] to (Evaluate:28May2016); Last Rx:24May2016 Ordered 7. Oxycodone-Acetaminophen 5-325 MG Oral Tablet;  Therapy: (Recorded:16May2016) to Recorded  Allergies Medication  1. No Known Drug Allergies  Family History Problems  1. Family history of cardiac disorder (Z82.49) : Mother 2. Family history of diabetes mellitus (Z83.3) : Mother  Social History Problems  1. Alcohol use (Z78.9)   occasionally 2. Denied: History of Caffeine use 3. Never a smoker 4. Number of children   2 daughters 5. Occupation   Account [employer] 6. Separated from significant other (Z63.5)  Vitals Vital Signs [Data Includes: Last 1 Day]  Recorded: [id number] 02:52PM  Blood Pressure: 126 / 78 Temperature: 97.5 F Heart Rate: 67  Physical Exam Constitutional: Well nourished and well developed . No acute distress.  Pulmonary: No respiratory distress and normal respiratory rhythm and effort.  Cardiovascular: Heart rate and rhythm are normal . No peripheral edema.  Neuro/Psych:. Mood and affect are appropriate.    Results/Data Urine [Data Includes: Last 1 Day]   [id number]  COLOR STRAW   APPEARANCE CLEAR   SPECIFIC GRAVITY <1.005   pH 6.0   GLUCOSE > 1000 mg/dL  BILIRUBIN NEG   KETONE NEG mg/dL  BLOOD MOD   PROTEIN NEG mg/dL  UROBILINOGEN 0.2 mg/dL  NITRITE NEG   LEUKOCYTE ESTERASE NEG   SQUAMOUS EPITHELIAL/HPF RARE   WBC NONE SEEN WBC/hpf  RBC 0-2 RBC/hpf  BACTERIA NONE SEEN   CRYSTALS NONE SEEN   CASTS  NONE SEEN    Assessment Assessed  1. Left ureteral stone (N20.1)  Plan Left ureteral stone  1. Follow-up Schedule Surgery Office  Follow-up  Status: Hold For - Appointment   Requested for: [id number] 2. AU CT-STONE PROTOCOL; Status:In Progress - Specimen/Data Collected;   Done:  [id number] 04:00PM 3. KUB; Status:Canceled - Date of Service;  4. RENAL U/S LEFT; Status:Canceled - Date of Service;    Discussion/Summary Left ureteral stone-stone now in the left distal ureter. I drew a picture of the CT images. We discussed the nature risk and benefits of continued second passage were going ahead and proceeding with ureteroscopy holmium laser lithotripsy and stent placement. We discussed possible need for a staged procedure and pre-stenting. We discussed the ureteral injury, bleeding and infection among others. All questions answered. He elects to proceed. We'll arrange this in the next few days.     Signatures Electronically signed by : Festus Aloe, M.D.; May 16 2015  4:43PM EST

## 2015-05-18 ENCOUNTER — Encounter (HOSPITAL_BASED_OUTPATIENT_CLINIC_OR_DEPARTMENT_OTHER): Payer: Self-pay | Admitting: Urology

## 2015-05-18 LAB — URINE CULTURE
Colony Count: NO GROWTH
Culture: NO GROWTH

## 2015-05-19 ENCOUNTER — Ambulatory Visit: Payer: BLUE CROSS/BLUE SHIELD | Admitting: Family Medicine

## 2015-05-23 ENCOUNTER — Encounter: Payer: Self-pay | Admitting: Family Medicine

## 2015-05-23 ENCOUNTER — Ambulatory Visit (INDEPENDENT_AMBULATORY_CARE_PROVIDER_SITE_OTHER): Payer: BLUE CROSS/BLUE SHIELD | Admitting: Family Medicine

## 2015-05-23 VITALS — BP 134/90 | HR 80 | Temp 98.1°F | Wt 167.0 lb

## 2015-05-23 DIAGNOSIS — F909 Attention-deficit hyperactivity disorder, unspecified type: Secondary | ICD-10-CM

## 2015-05-23 DIAGNOSIS — E1165 Type 2 diabetes mellitus with hyperglycemia: Secondary | ICD-10-CM

## 2015-05-23 DIAGNOSIS — IMO0002 Reserved for concepts with insufficient information to code with codable children: Secondary | ICD-10-CM

## 2015-05-23 DIAGNOSIS — F1010 Alcohol abuse, uncomplicated: Secondary | ICD-10-CM | POA: Diagnosis not present

## 2015-05-23 DIAGNOSIS — F988 Other specified behavioral and emotional disorders with onset usually occurring in childhood and adolescence: Secondary | ICD-10-CM

## 2015-05-23 MED ORDER — ATOMOXETINE HCL 40 MG PO CAPS: ORAL_CAPSULE | ORAL | Status: DC

## 2015-05-23 NOTE — Progress Notes (Signed)
Subjective:    Patient ID: Carlos [name], male    DOB: August 23, 1971, 44 y.o.   MRN: [id number]  HPI Patient here to discuss the following issues  Type 2 diabetes. Recent A1c 7.4%. He has not been compliant with taking metformin or Januvia over the past 6 weeks but about couple weeks discuss started back. Not monitoring blood sugars regular. No polyuria or polydipsia.  Recent kidney stones. He had extraction and stent placement just last week. He just remove stent earlier this morning. No recurrent pain. No dysuria  Attention deficit disorder history. He was on Adderall. He had drug screen back in April which was negative for Adderall but he states he had taken himself off all medications a couple weeks prior to that screen. He did however have positive metabolite for cocaine. We refused to refill his Adderall that point. He is here now to discuss this. He is currently in [place] with past history of alcohol abuse. He states he is use cocaine once only in his entire life and none since then. No illicit drug use otherwise.  Past Medical History  Diagnosis Date  . Depression   . Hyperlipidemia   . Alcohol abuse   . Kidney stones   . Type 2 diabetes mellitus   . ADHD (attention deficit hyperactivity disorder)   . Left ureteral stone   . History of kidney stones   . History of exercise stress test     03-18-2013--  normal   Past Surgical History  Procedure Laterality Date  . Cardiac catheterization  11-27-2006  dr Verlon Setting    non-obstructive CAD/  20% proximal and mid LAD/  perserved LVF,  ef 55-60%  . Extracorporeal shock wave lithotripsy Left 05-08-2015  . Extraction right mandibular , premolar/  maxillary mandibular fixation with screws  08-03-2008  . Orif four hold plate and maxillomandibular fixation w/ arch bars  08-05-2008    REMOVAL ARCH BARS 09-15-2008  . Transthoracic echocardiogram  04-06-2008  dr Verlon Setting    normal LVF,  ef 55-60%,  trivial MR and TR  .  Cystoscopy/ureteroscopy/holmium laser/stent placement Left 05/17/2015    Procedure: LEFT URETEROSCOPY/HOLMIUM LASER/STENT PLACEMENT;  Surgeon: Festus Aloe, MD;  Location: [facility];  Service: Urology;  Laterality: Left;  . Cystoscopy w/ retrogrades Left 05/17/2015    Procedure: CYSTOSCOPY WITH RETROGRADE PYELOGRAM;  Surgeon: Festus Aloe, MD;  Location: [facility];  Service: Urology;  Laterality: Left;    reports that he has never smoked. He does not have any smokeless tobacco history on file. He reports that he drinks alcohol. He reports that he does not use illicit drugs. family history includes Alcohol abuse in his father, maternal grandfather, and maternal uncle; Diabetes in his maternal grandfather, maternal grandmother, and mother; Heart disease in his mother. No Known Allergies    Review of Systems  Constitutional: Negative for fatigue.  Eyes: Negative for visual disturbance.  Respiratory: Negative for cough, chest tightness and shortness of breath.   Cardiovascular: Negative for chest pain, palpitations and leg swelling.  Endocrine: Negative for polydipsia and polyuria.  Neurological: Negative for dizziness, syncope, weakness, light-headedness and headaches.       Objective:   Physical Exam  Constitutional: He appears well-developed and well-nourished. No distress.  Cardiovascular: Normal rate and regular rhythm.   Pulmonary/Chest: Effort normal and breath sounds normal. No respiratory distress. He has no wheezes. He has no rales.  Musculoskeletal: He exhibits no edema.  Psychiatric: He has a normal mood  and affect. His behavior is normal. Judgment and thought content normal.          Assessment & Plan:  #1 type 2 diabetes. History of fair control. Ordered future lab for A1c in one month. Establish more consistent use with metformin and Januvia #2 history of alcohol/drug abuse. Patient currently in [place] and is strongly encouraged to stay  in that program.  We also described a couple of other drug rehabilitation support programs which he is not interested in pursuing at this time #3 attention deficit disorder. We expressed our reluctance to use any controlled medications such as stimulant with drug abuse history as above. We did agree to trial of Strattera 40 mg once daily for one week then titrate 80 mg daily. Reassess in 2 months. We reviewed possible side effects

## 2015-05-23 NOTE — Progress Notes (Signed)
Pre visit review using our clinic review tool, if applicable. No additional management support is needed unless otherwise documented below in the visit note. 

## 2015-05-29 ENCOUNTER — Other Ambulatory Visit: Payer: Self-pay | Admitting: Family Medicine

## 2015-05-29 NOTE — Telephone Encounter (Signed)
Benzodiazepines are not an option (with his ETOH and drug abuse hx) and cannot use SSRIs with Strattera.  I would be happy to refer to [facility] for cognitive behavioral therapy-if he is willing.  If he is not tolerant of the Strattera we could try SSRI- but we are hoping the Strattera can help his ADD.

## 2015-05-29 NOTE — Telephone Encounter (Signed)
Pt would like to know if dr will prescribe him med for anxiety.  Pt has stopped the adderall, stopped  the drinking, but is having major anxiety.  [facility]

## 2015-05-29 NOTE — Telephone Encounter (Signed)
Left message on machine for patient to return our call 

## 2015-05-30 NOTE — Telephone Encounter (Signed)
Patient states that he "never picked up the Strattera and does not want to take medication for ADD but would like to try something for anxiety". Please advise

## 2015-05-30 NOTE — Telephone Encounter (Signed)
Take Straterra off list.  Zoloft 50 mg once daily #30 with 2 refills and office follow up to reassess one month.

## 2015-05-31 MED ORDER — SERTRALINE HCL 50 MG PO TABS: 50.0000 mg | ORAL_TABLET | Freq: Every day | ORAL | Status: DC

## 2015-05-31 NOTE — Telephone Encounter (Signed)
Called and spoke with pt and pt is aware.  

## 2015-07-13 ENCOUNTER — Other Ambulatory Visit: Payer: BLUE CROSS/BLUE SHIELD

## 2015-07-27 ENCOUNTER — Other Ambulatory Visit: Payer: BLUE CROSS/BLUE SHIELD

## 2015-08-04 ENCOUNTER — Other Ambulatory Visit: Payer: BLUE CROSS/BLUE SHIELD

## 2015-08-08 ENCOUNTER — Other Ambulatory Visit (INDEPENDENT_AMBULATORY_CARE_PROVIDER_SITE_OTHER): Payer: BLUE CROSS/BLUE SHIELD

## 2015-08-08 ENCOUNTER — Other Ambulatory Visit: Payer: Self-pay | Admitting: Family Medicine

## 2015-08-08 ENCOUNTER — Other Ambulatory Visit: Payer: Self-pay | Admitting: *Deleted

## 2015-08-08 ENCOUNTER — Telehealth: Payer: Self-pay | Admitting: Family Medicine

## 2015-08-08 DIAGNOSIS — E119 Type 2 diabetes mellitus without complications: Secondary | ICD-10-CM | POA: Diagnosis not present

## 2015-08-08 DIAGNOSIS — IMO0002 Reserved for concepts with insufficient information to code with codable children: Secondary | ICD-10-CM

## 2015-08-08 DIAGNOSIS — E1165 Type 2 diabetes mellitus with hyperglycemia: Secondary | ICD-10-CM

## 2015-08-08 MED ORDER — METFORMIN HCL 500 MG PO TABS: 1000.0000 mg | ORAL_TABLET | Freq: Two times a day (BID) | ORAL | Status: DC

## 2015-08-08 NOTE — Telephone Encounter (Signed)
The most useful tests to determine insulin production are C-peptide level and insulin levels.

## 2015-08-08 NOTE — Telephone Encounter (Signed)
Pt has an appt today at 2 pm for a1c and would like to have his ?pancreas enzymes check to see how much insulin he is retenting

## 2015-08-08 NOTE — Telephone Encounter (Signed)
Lab is ordered.

## 2015-08-10 LAB — HEMOGLOBIN A1C: Hgb A1c MFr Bld: 7.6 % — ABNORMAL HIGH (ref 4.6–6.5)

## 2015-08-11 LAB — C-PEPTIDE: C PEPTIDE: 1.54 ng/mL (ref 0.80–3.90)

## 2015-08-11 LAB — INSULIN, FASTING: INSULIN FASTING, SERUM: 5.1 u[IU]/mL (ref 2.0–19.6)

## 2015-08-17 ENCOUNTER — Ambulatory Visit: Payer: BLUE CROSS/BLUE SHIELD | Admitting: Family Medicine

## 2015-11-24 ENCOUNTER — Ambulatory Visit (INDEPENDENT_AMBULATORY_CARE_PROVIDER_SITE_OTHER): Payer: BLUE CROSS/BLUE SHIELD | Admitting: Family Medicine

## 2015-11-24 ENCOUNTER — Encounter: Payer: Self-pay | Admitting: Family Medicine

## 2015-11-24 VITALS — BP 100/80 | HR 84 | Temp 98.0°F | Resp 16 | Ht 76.0 in | Wt 164.0 lb

## 2015-11-24 DIAGNOSIS — E1165 Type 2 diabetes mellitus with hyperglycemia: Secondary | ICD-10-CM | POA: Diagnosis not present

## 2015-11-24 DIAGNOSIS — IMO0001 Reserved for inherently not codable concepts without codable children: Secondary | ICD-10-CM

## 2015-11-24 LAB — HEMOGLOBIN A1C: HEMOGLOBIN A1C: 7.8 % — AB (ref 4.6–6.5)

## 2015-11-24 NOTE — Progress Notes (Signed)
Pre visit review using our clinic review tool, if applicable. No additional management support is needed unless otherwise documented below in the visit note. 

## 2015-11-24 NOTE — Progress Notes (Signed)
Subjective:    Patient ID: Carlos [name], male    DOB: 1971-08-21, 44 y.o.   MRN: [id number]  HPI Left foot pain. This past July he was playing around on the beach with his sister and left foot struck the back of her heel. He noticed some swelling and bruising right away and thinks he may have broken his fifth toe. Since then, at times had some intermittent fleeting sharp pains involving his left foot in various locations. He felt this may be neuropathic pain. He's not had any pain with ambulation. No visible swelling or bruising at this time.  Type 2 diabetes. Recent C-peptide and insulin levels were normal. He takes metformin. Had been on Januvia but has not taken for 2 months. Not monitoring blood sugars regularly. Last A1c 7.6%. No polyuria or polydipsia.  Past Medical History  Diagnosis Date  . Depression   . Hyperlipidemia   . Alcohol abuse   . Kidney stones   . Type 2 diabetes mellitus ([place])   . ADHD (attention deficit hyperactivity disorder)   . Left ureteral stone   . History of kidney stones   . History of exercise stress test     03-18-2013--  normal   Past Surgical History  Procedure Laterality Date  . Cardiac catheterization  11-27-2006  dr Verlon Setting    non-obstructive CAD/  20% proximal and mid LAD/  perserved LVF,  ef 55-60%  . Extracorporeal shock wave lithotripsy Left 05-08-2015  . Extraction right mandibular , premolar/  maxillary mandibular fixation with screws  08-03-2008  . Orif four hold plate and maxillomandibular fixation w/ arch bars  08-05-2008    REMOVAL ARCH BARS 09-15-2008  . Transthoracic echocardiogram  04-06-2008  dr Verlon Setting    normal LVF,  ef 55-60%,  trivial MR and TR  . Cystoscopy/ureteroscopy/holmium laser/stent placement Left 05/17/2015    Procedure: LEFT URETEROSCOPY/HOLMIUM LASER/STENT PLACEMENT;  Surgeon: Festus Aloe, MD;  Location: [facility];  Service: Urology;  Laterality: Left;  . Cystoscopy w/ retrogrades Left 05/17/2015      Procedure: CYSTOSCOPY WITH RETROGRADE PYELOGRAM;  Surgeon: Festus Aloe, MD;  Location: [facility];  Service: Urology;  Laterality: Left;    reports that he has never smoked. He does not have any smokeless tobacco history on file. He reports that he drinks alcohol. He reports that he does not use illicit drugs. family history includes Alcohol abuse in his father, maternal grandfather, and maternal uncle; Diabetes in his maternal grandfather, maternal grandmother, and mother; Heart disease in his mother. No Known Allergies    Review of Systems  Constitutional: Negative for fatigue.  Eyes: Negative for visual disturbance.  Respiratory: Negative for cough, chest tightness and shortness of breath.   Cardiovascular: Negative for chest pain, palpitations and leg swelling.  Endocrine: Negative for polydipsia and polyuria.  Neurological: Negative for dizziness, syncope, weakness, light-headedness and headaches.       Objective:   Physical Exam  Constitutional: He appears well-developed and well-nourished.  Cardiovascular: Normal rate and regular rhythm.   Pulmonary/Chest: Effort normal and breath sounds normal. No respiratory distress. He has no wheezes. He has no rales.  Musculoskeletal:  Left foot reveals no swelling. No ecchymosis. No warmth. No bony tenderness. Excellent pulses. Normal sensory function with monofilament testing.          Assessment & Plan:  #1 type 2 diabetes. History of poor control. Recheck A1c. Patient currently only taking metformin. Consider possible SGLT 2 inhibitor if still  elevated. He is reluctant to look it injectable such as insulin #2 fleeting left foot pain. Sounds more neuropathic. He is not interested in medication at this time as pain is relatively mild and fleeting. Consider low-dose gabapentin if symptoms persist or worsen

## 2015-11-27 MED ORDER — CANAGLIFLOZIN 100 MG PO TABS: 100.0000 mg | ORAL_TABLET | Freq: Every day | ORAL | Status: DC

## 2015-11-27 NOTE — Addendum Note (Signed)
Addended by: Elio Forget on: 11/27/2015 11:31 AM   Modules accepted: Orders

## 2015-12-19 ENCOUNTER — Telehealth: Payer: Self-pay | Admitting: Family Medicine

## 2015-12-19 MED ORDER — ONETOUCH LANCETS MISC: Status: DC

## 2015-12-19 MED ORDER — GLUCOSE BLOOD VI STRP: ORAL_STRIP | Status: DC

## 2015-12-19 MED ORDER — METFORMIN HCL 500 MG PO TABS: 1000.0000 mg | ORAL_TABLET | Freq: Two times a day (BID) | ORAL | Status: DC

## 2015-12-19 NOTE — Telephone Encounter (Signed)
[place] [facility] - Client [facility]     Patient Name: Carlos [name] Client [place] [facility] - Client    Client Site [place] - Day    Physician Carolann Littler   Gender: Male Contact Type Call  DOB: 08-Jul-1971  Call Type Triage / Clinical  Age: 45 Y 5 M 14 D Relationship To Patient Self  Return Phone Number: [phone] (Primary) Return Phone Number [phone] (Primary)  Address:  Chief Complaint Medication Question (non symptomatic)  City/State/Zip: [place] [address] Initial Comment Caller states was put on new diabetes med, has questions about the old med, and the monitor is not working     Nurse Assessment  Nurse: Amalia Hailey, Therapist, sports, Melissa Date/Time ([place] Time): 12/19/2015 3:18:05 PM  Confirm and document reason for call. If symptomatic, describe symptoms. ---Caller states was put on new diabetes med, has questions about the old med, and the monitor is not working  Has the patient traveled out of the country within the last 30 days? ---Not Applicable  Does the patient have any new or worsening symptoms? ---No  Please document clinical information provided and list any resource used. ---Caller reports he has questions about whether he is to continue the Metformin in addition to the [place] that has just been prescribed. He also has a glucose monitor that is not working, has had for over a year and will need a new monitor ordered. Caller advised will consult his chart to see if this can be answered for him regarding the medication question, unable to find clarification for the pt,so consulted the back line and spoke to [place] who reports she will give a message to a practitioner in that group who will call the pt. back regarding his question and the request for new glucose monitor order. Caller advised of this information and verb. understood.    Guidelines      Guideline Title  Affirmed Question Affirmed Notes Nurse Date/Time (Eastern Time)        Disp. Time Eilene Ghazi Time) Disposition Final User   12/19/2015 3:09:35 PM Send To Clinical Follow Up Rod Can, RN, Dellis Filbert           After Care Instructions Given     Call Event Type User Date / Time Description

## 2015-12-19 NOTE — Telephone Encounter (Signed)
Duplicate message. 

## 2015-12-19 NOTE — Telephone Encounter (Signed)
[facility] called with the patient on the line wanting to know if he needs to continue taking metFORMIN (GLUCOPHAGE) 500 MG tablet with canagliflozin (INVOKANA) 100 MG TABS tablet . He also needs a Rx called in for his machine because it quit working.

## 2015-12-19 NOTE — Telephone Encounter (Signed)
Spoke with patient and he will continue his metformin and Invokana.  Glucometer ready to pick up and refills sent per patient's request.

## 2016-01-08 ENCOUNTER — Telehealth: Payer: Self-pay | Admitting: Family Medicine

## 2016-01-08 NOTE — Telephone Encounter (Signed)
jardiance 10 mg po qd and take Invokana OFF list.

## 2016-01-08 NOTE — Telephone Encounter (Signed)
Would one of the other medications work for patient instead?

## 2016-01-08 NOTE — Telephone Encounter (Signed)
Pt insurance has denied canagliflozin (INVOKANA) 100 MG TABS tablet until he has tried jardiance or synjardy.  Can pt get an rx for one of these for a month to see if will work?  Aeronautical engineer

## 2016-01-09 MED ORDER — EMPAGLIFLOZIN 10 MG PO TABS: 10.0000 mg | ORAL_TABLET | Freq: Every day | ORAL | Status: DC

## 2016-01-09 NOTE — Telephone Encounter (Signed)
New medication has been sent in for patient and Invokana marked off list.

## 2016-01-12 NOTE — Telephone Encounter (Signed)
Coupon available for pick up and patient is aware.

## 2016-01-12 NOTE — Telephone Encounter (Signed)
Pt's JANUVIA was denied and dr burchette switched him to empagliflozin (JARDIANCE) 10 MG TABS tablet. Pt's insurance advised him he would be eligible for a free year supply with a coupon from our office.  Pt would like to know if we have those?

## 2016-02-08 ENCOUNTER — Ambulatory Visit: Payer: BLUE CROSS/BLUE SHIELD | Admitting: Family Medicine

## 2016-02-16 ENCOUNTER — Telehealth: Payer: Self-pay | Admitting: Family Medicine

## 2016-02-16 MED ORDER — METFORMIN HCL 500 MG PO TABS: 1000.0000 mg | ORAL_TABLET | Freq: Two times a day (BID) | ORAL | Status: DC

## 2016-02-16 NOTE — Telephone Encounter (Signed)
Medication refilled for patient.

## 2016-02-16 NOTE — Telephone Encounter (Signed)
Pt request refill  metFORMIN (GLUCOPHAGE) 500 MG tablet 180 tabs 2 (2X /day)  [facility]  Pt states he is out. No refills at pharmacy

## 2016-04-30 ENCOUNTER — Ambulatory Visit (INDEPENDENT_AMBULATORY_CARE_PROVIDER_SITE_OTHER): Payer: BLUE CROSS/BLUE SHIELD | Admitting: Family Medicine

## 2016-04-30 ENCOUNTER — Encounter: Payer: Self-pay | Admitting: Family Medicine

## 2016-04-30 VITALS — BP 102/80 | HR 88 | Temp 97.8°F | Ht 76.0 in | Wt 156.0 lb

## 2016-04-30 DIAGNOSIS — R634 Abnormal weight loss: Secondary | ICD-10-CM

## 2016-04-30 DIAGNOSIS — E785 Hyperlipidemia, unspecified: Secondary | ICD-10-CM

## 2016-04-30 DIAGNOSIS — S30861A Insect bite (nonvenomous) of abdominal wall, initial encounter: Secondary | ICD-10-CM | POA: Diagnosis not present

## 2016-04-30 DIAGNOSIS — W57XXXA Bitten or stung by nonvenomous insect and other nonvenomous arthropods, initial encounter: Secondary | ICD-10-CM

## 2016-04-30 DIAGNOSIS — E1165 Type 2 diabetes mellitus with hyperglycemia: Secondary | ICD-10-CM

## 2016-04-30 DIAGNOSIS — IMO0001 Reserved for inherently not codable concepts without codable children: Secondary | ICD-10-CM

## 2016-04-30 LAB — CBC WITH DIFFERENTIAL/PLATELET
Basophils Absolute: 0 10*3/uL (ref 0.0–0.1)
Basophils Relative: 0.5 % (ref 0.0–3.0)
Eosinophils Absolute: 0.2 10*3/uL (ref 0.0–0.7)
Eosinophils Relative: 3.4 % (ref 0.0–5.0)
HCT: 44.5 % (ref 39.0–52.0)
Hemoglobin: 15.3 g/dL (ref 13.0–17.0)
Lymphocytes Relative: 29.3 % (ref 12.0–46.0)
Lymphs Abs: 1.9 10*3/uL (ref 0.7–4.0)
MCHC: 34.3 g/dL (ref 30.0–36.0)
MCV: 93.3 fl (ref 78.0–100.0)
Monocytes Absolute: 0.4 10*3/uL (ref 0.1–1.0)
Monocytes Relative: 5.9 % (ref 3.0–12.0)
Neutro Abs: 4 10*3/uL (ref 1.4–7.7)
Neutrophils Relative %: 60.9 % (ref 43.0–77.0)
Platelets: 212 10*3/uL (ref 150.0–400.0)
RBC: 4.77 Mil/uL (ref 4.22–5.81)
RDW: 12.9 % (ref 11.5–15.5)
WBC: 6.5 10*3/uL (ref 4.0–10.5)

## 2016-04-30 LAB — LIPID PANEL
CHOL/HDL RATIO: 5
Cholesterol: 199 mg/dL (ref 0–200)
HDL: 40.3 mg/dL (ref >39.00)
LDL Cholesterol: 122 mg/dL — ABNORMAL HIGH (ref 0–99)
NONHDL: 158.7
Triglycerides: 185 mg/dL — ABNORMAL HIGH (ref 0.0–149.0)
VLDL: 37 mg/dL (ref 0.0–40.0)

## 2016-04-30 LAB — HEMOGLOBIN A1C: HEMOGLOBIN A1C: 6.6 % — AB (ref 4.6–6.5)

## 2016-04-30 LAB — MICROALBUMIN / CREATININE URINE RATIO
Creatinine,U: 97.2 mg/dL
MICROALB/CREAT RATIO: 0.7 mg/g (ref 0.0–30.0)
Microalb, Ur: <0.7 mg/dL (ref 0.0–1.9)

## 2016-04-30 LAB — BASIC METABOLIC PANEL WITH GFR
BUN: 16 mg/dL (ref 6–23)
CO2: 29 meq/L (ref 19–32)
Calcium: 9.7 mg/dL (ref 8.4–10.5)
Chloride: 101 meq/L (ref 96–112)
Creatinine, Ser: 0.89 mg/dL (ref 0.40–1.50)
GFR: 98.78 mL/min
Glucose, Bld: 121 mg/dL — ABNORMAL HIGH (ref 70–99)
Potassium: 4.3 meq/L (ref 3.5–5.1)
Sodium: 139 meq/L (ref 135–145)

## 2016-04-30 LAB — HEPATIC FUNCTION PANEL
ALK PHOS: 48 U/L (ref 39–117)
ALT: 11 U/L (ref 0–53)
AST: 13 U/L (ref 0–37)
Albumin: 4.4 g/dL (ref 3.5–5.2)
Bilirubin, Direct: 0.1 mg/dL (ref 0.0–0.3)
TOTAL PROTEIN: 6.9 g/dL (ref 6.0–8.3)
Total Bilirubin: 0.7 mg/dL (ref 0.2–1.2)

## 2016-04-30 LAB — TSH: TSH: 2.15 u[IU]/mL (ref 0.35–4.50)

## 2016-04-30 MED ORDER — DOXYCYCLINE HYCLATE 100 MG PO CAPS: 100.0000 mg | ORAL_CAPSULE | Freq: Two times a day (BID) | ORAL | Status: DC

## 2016-04-30 NOTE — Progress Notes (Signed)
Pre visit review using our clinic review tool, if applicable. No additional management support is needed unless otherwise documented below in the visit note. 

## 2016-04-30 NOTE — Progress Notes (Signed)
Subjective:    Patient ID: Carlos [name], male    DOB: 08-10-1971, 45 y.o.   MRN: [id number]  HPI Here for the following issues  Diabetes. Type II. Takes metformin. Previously on DPP-4 inhibitor but did not have much control. We eventually switched to Jardiance.  Not monitoring blood sugars. Last A1c 7.8%. He has had some weight loss since starting this of about 8 pounds. Good appetite. No polyuria or polydipsia. Previous C-peptide levels normal  Tick bite yesterday. Pull off was sounds like a possible lone star tick from his umbilicus region. Increased redness today.  Minimal tenderness. No fevers or chills. No headache. Minimal induration in the region. No fluctuance.  History of dyslipidemia with high triglycerides. He has reduced alcohol considerably. Requesting repeat lipids today. Not checked in 1 year.  Past Medical History  Diagnosis Date  . Depression   . Hyperlipidemia   . Alcohol abuse   . Kidney stones   . Type 2 diabetes mellitus ([place])   . ADHD (attention deficit hyperactivity disorder)   . Left ureteral stone   . History of kidney stones   . History of exercise stress test     03-18-2013--  normal   Past Surgical History  Procedure Laterality Date  . Cardiac catheterization  11-27-2006  dr Verlon Setting    non-obstructive CAD/  20% proximal and mid LAD/  perserved LVF,  ef 55-60%  . Extracorporeal shock wave lithotripsy Left 05-08-2015  . Extraction right mandibular , premolar/  maxillary mandibular fixation with screws  08-03-2008  . Orif four hold plate and maxillomandibular fixation w/ arch bars  08-05-2008    REMOVAL ARCH BARS 09-15-2008  . Transthoracic echocardiogram  04-06-2008  dr Verlon Setting    normal LVF,  ef 55-60%,  trivial MR and TR  . Cystoscopy/ureteroscopy/holmium laser/stent placement Left 05/17/2015    Procedure: LEFT URETEROSCOPY/HOLMIUM LASER/STENT PLACEMENT;  Surgeon: Festus Aloe, MD;  Location[facility];  Service: Urology;   Laterality: Left;  . Cystoscopy w/ retrogrades Left 05/17/2015    Procedure: CYSTOSCOPY WITH RETROGRADE PYELOGRAM;  Surgeon: Festus Aloe, MD;  Location: [facility];  Service: Urology;  Laterality: Left;    reports that he has never smoked. He does not have any smokeless tobacco history on file. He reports that he drinks alcohol. He reports that he does not use illicit drugs. family history includes Alcohol abuse in his father, maternal grandfather, and maternal uncle; Diabetes in his maternal grandfather, maternal grandmother, and mother; Heart disease in his mother. No Known Allergies    Review of Systems  Constitutional: Negative for fever, chills, appetite change and fatigue.  Eyes: Negative for visual disturbance.  Respiratory: Negative for cough, chest tightness and shortness of breath.   Cardiovascular: Negative for chest pain, palpitations and leg swelling.  Gastrointestinal: Negative for nausea, vomiting and abdominal pain.  Skin: Positive for rash.  Neurological: Negative for dizziness, syncope, weakness, light-headedness and headaches.  Hematological: Negative for adenopathy.       Objective:   Physical Exam  Constitutional: He appears well-developed and well-nourished.  Neck: Neck supple.  Cardiovascular: Normal rate and regular rhythm.   Pulmonary/Chest: Effort normal and breath sounds normal. No respiratory distress. He has no wheezes. He has no rales.  Musculoskeletal: He exhibits no edema.  Lymphadenopathy:    He has no cervical adenopathy.  Skin:  Approximately 2 cm diameter erythema around the umbilicus region. Nontender. No fluctuance          Assessment &  Plan:  #1 diabetes. Type II. Previous C-peptide levels normal. Recheck A1c. Consider transitioning to long-acting insulin if not controlled.  Has some weight loss possibly related to Jardiance.  #2 tick bite. Possible early cellulitis. Doxycycline 100 mg twice a day for 10 days  #3  dyslipidemia. Continued avoidance of alcohol. Recheck fasting lipids  #4 past history of alcohol abuse. Patient has declined treatment programs. Is currently doing much better with infrequent use recently. We have recommended total abstinence  Eulas Post MD [facility]

## 2016-04-30 NOTE — Patient Instructions (Signed)
Tick Bite Information °Ticks are insects that attach themselves to the skin and draw blood for food. There are various types of ticks. Common types include wood ticks and deer ticks. Most ticks live in shrubs and grassy areas. Ticks can climb onto your body when you make contact with leaves or grass where the tick is waiting. The most common places on the body for ticks to attach themselves are the scalp, neck, armpits, waist, and groin. °Most tick bites are harmless, but sometimes ticks carry germs that cause diseases. These germs can be spread to a person during the tick's feeding process. The chance of a disease spreading through a tick bite depends on:  °· The type of tick. °· Time of year.   °· How long the tick is attached.   °· Geographic location.   °HOW CAN YOU PREVENT TICK BITES? °Take these steps to help prevent tick bites when you are outdoors: °· Wear protective clothing. Long sleeves and long pants are best.   °· Wear white clothes so you can see ticks more easily. °· Tuck your pant legs into your socks.   °· If walking on a trail, stay in the middle of the trail to avoid brushing against bushes. °· Avoid walking through areas with long grass.  °· Put insect repellent on all exposed skin and along boot tops, pant legs, and sleeve cuffs.   °· Check clothing, hair, and skin repeatedly and before going inside.   °· Brush off any ticks that are not attached. °· Take a shower or bath as soon as possible after being outdoors.    °WHAT IS THE PROPER WAY TO REMOVE A TICK? °Ticks should be removed as soon as possible to help prevent diseases caused by tick bites. °1. If latex gloves are available, put them on before trying to remove a tick.   °2. Using fine-point tweezers, grasp the tick as close to the skin as possible. You may also use curved forceps or a tick removal tool. Grasp the tick as close to its head as possible. Avoid grasping the tick on its body. °3. Pull gently with steady upward pressure until  the tick lets go. Do not twist the tick or jerk it suddenly. This may break off the tick's head or mouth parts. °4. Do not squeeze or crush the tick's body. This could force disease-carrying fluids from the tick into your body.   °5. After the tick is removed, wash the bite area and your hands with soap and water or other disinfectant such as alcohol. °6. Apply a small amount of antiseptic cream or ointment to the bite site.   °7. Wash and disinfect any instruments that were used.   °Do not try to remove a tick by applying a hot match, petroleum jelly, or fingernail polish to the tick. These methods do not work and may increase the chances of disease being spread from the tick bite.  °WHEN SHOULD YOU SEEK MEDICAL CARE? °Contact your health care provider if you are unable to remove a tick from your skin or if a part of the tick breaks off and is stuck in the skin.  °After a tick bite, you need to be aware of signs and symptoms that could be related to diseases spread by ticks. Contact your health care provider if you develop any of the following in the days or weeks after the tick bite: °· Unexplained fever. °· Rash. A circular rash that appears days or weeks after the tick bite may indicate the possibility of Lyme disease. The rash may resemble   a target with a bull's-eye and may occur at a different part of your body than the tick bite.  Redness and swelling in the area of the tick bite.   Tender, swollen lymph glands.   Diarrhea.   Weight loss.   Cough.   Fatigue.   Muscle, joint, or bone pain.   Abdominal pain.   Headache.   Lethargy or a change in your level of consciousness.  Difficulty walking or moving your legs.   Numbness in the legs.   Paralysis.  Shortness of breath.   Confusion.   Repeated vomiting.    This information is not intended to replace advice given to you by your health care provider. Make sure you discuss any questions you have with your health  care provider.   Document Released: 11/29/2000 Document Revised: 12/23/2014 Document Reviewed: 05/12/2013 Elsevier Interactive Patient Education [employer].

## 2016-05-02 ENCOUNTER — Telehealth: Payer: Self-pay | Admitting: Family Medicine

## 2016-05-02 NOTE — Telephone Encounter (Signed)
Patient Name: Carlos [name]  DOB: 1971/01/27    Initial Comment Caller states he was seen for a Tick Bite, the itching is getting worse.   Nurse Assessment  Nurse: Raphael Gibney, RN, Vanita Ingles Date/Time (Eastern Time): 05/02/2016 1:55:35 PM  Confirm and document reason for call. If symptomatic, describe symptoms. You must click the next button to save text entered. ---Caller states he was seen for a tick bite on Tuesday. He was prescribed Doxycycline. He was bitten on his abd. Has a knot where he was bitten that is getting bigger. It is itching worse and is now 3-4 inches around the bite.  Has the patient traveled out of the country within the last 30 days? ---Not Applicable  Does the patient have any new or worsening symptoms? ---Yes  Will a triage be completed? ---Yes  Related visit to physician within the last 2 weeks? ---Yes  Does the PT have any chronic conditions? (i.e. diabetes, asthma, etc.) ---Yes  List chronic conditions. ---diabetes  Is this a behavioral health or substance abuse call? ---No     Guidelines    Guideline Title Affirmed Question Affirmed Notes  Tick Bite Tick bite with no complications (all triage questions negative)    Final Disposition User   See Physician within 56 Hours Stringer, RN, Vanita Ingles    Comments  Pt states he needs his appt around 8:30 am and no appts are available Please call pt back regarding appt.  Triage outcome upgraded to see physician within 24 hrs as knot has gotten bigger and itching has increased.   Referrals  GO TO FACILITY REFUSED   Disagree/Comply: Comply

## 2016-05-02 NOTE — Telephone Encounter (Signed)
Due to patient's schedule restrictions, scheduled with Dr. [place] tomorrow at [facility].

## 2016-05-03 ENCOUNTER — Ambulatory Visit: Payer: BLUE CROSS/BLUE SHIELD | Admitting: Family Medicine

## 2016-05-10 ENCOUNTER — Ambulatory Visit: Payer: BLUE CROSS/BLUE SHIELD | Admitting: Family Medicine

## 2016-05-10 DIAGNOSIS — Z0289 Encounter for other administrative examinations: Secondary | ICD-10-CM

## 2016-05-24 ENCOUNTER — Ambulatory Visit: Payer: BLUE CROSS/BLUE SHIELD | Admitting: Adult Health

## 2016-05-24 DIAGNOSIS — Z0289 Encounter for other administrative examinations: Secondary | ICD-10-CM

## 2016-05-28 ENCOUNTER — Ambulatory Visit (INDEPENDENT_AMBULATORY_CARE_PROVIDER_SITE_OTHER): Payer: BLUE CROSS/BLUE SHIELD | Admitting: Family Medicine

## 2016-05-28 ENCOUNTER — Encounter: Payer: Self-pay | Admitting: Family Medicine

## 2016-05-28 VITALS — BP 100/80 | HR 78 | Ht 76.0 in | Wt 157.0 lb

## 2016-05-28 DIAGNOSIS — D225 Melanocytic nevi of trunk: Secondary | ICD-10-CM | POA: Diagnosis not present

## 2016-05-28 DIAGNOSIS — D239 Other benign neoplasm of skin, unspecified: Secondary | ICD-10-CM

## 2016-05-28 DIAGNOSIS — T148 Other injury of unspecified body region: Secondary | ICD-10-CM

## 2016-05-28 DIAGNOSIS — L509 Urticaria, unspecified: Secondary | ICD-10-CM | POA: Diagnosis not present

## 2016-05-28 DIAGNOSIS — D229 Melanocytic nevi, unspecified: Secondary | ICD-10-CM

## 2016-05-28 DIAGNOSIS — W57XXXA Bitten or stung by nonvenomous insect and other nonvenomous arthropods, initial encounter: Secondary | ICD-10-CM | POA: Diagnosis not present

## 2016-05-28 NOTE — Patient Instructions (Addendum)
Keep wound dry for the first 24 hours then clean daily with soap and water for one week. Apply topical antibiotic daily for 3-4 days. Keep covered with clean dressing for 4-5 days. Follow up promptly for any signs of infection such as redness, warmth, pain, or drainage. Moles Moles are usually harmless growths on the skin. They are accumulations of color (pigment) cells in the skin that:   Can be various colors, from light brown to black.  Can appear anywhere on the body.  May remain flat or become raised.  May contain hairs.  May remain smooth or develop wrinkling. Most moles are not cancerous (benign). However, some moles may develop changes and become cancerous. It is important to check your moles every month. If you check your moles regularly, you will be able to notice any changes that may occur.  CAUSES  Moles occur when skin cells grow together in clusters instead of spreading out in the skin as they normally do. The reason for this clustering is unknown. DIAGNOSIS  Your caregiver will perform a skin examination to diagnose your mole.  TREATMENT  Moles usually do not require treatment. If a mole becomes worrisome, your caregiver may choose to take a sample of the mole or remove it entirely, and then send it to a lab for examination.  HOME CARE INSTRUCTIONS  Check your mole(s) monthly for changes that may indicate skin cancer. These changes can include:  A change in size.  A change in color. Note that moles tend to darken during pregnancy or when taking birth control pills (oral contraception).  A change in shape.  A change in the border of the mole.  Wear sunscreen (with an SPF of at least 10) when you spend long periods of time outside. Reapply the sunscreen every 2-3 hours.  Schedule annual appointments with your skin doctor (dermatologist) if you have a large number of moles. SEEK MEDICAL CARE IF:  Your mole changes size, especially if it becomes larger than a pencil  eraser.  Your mole changes in color or develops more than one color.  Your mole becomes itchy or bleeds.  Your mole, or the skin near the mole, becomes painful, sore, red, or swollen.  Your mole becomes scaly, sheds skin, or oozes fluid.  Your mole develops irregular borders.  Your mole becomes flat or develops raised areas.  Your mole becomes hard or soft.   This information is not intended to replace advice given to you by your health care provider. Make sure you discuss any questions you have with your health care provider.   Document Released: 08/27/2001 Document Revised: 08/26/2012 Document Reviewed: 06/15/2012 Elsevier Interactive Patient Education [employer].

## 2016-05-28 NOTE — Progress Notes (Signed)
Subjective:    Patient ID: Carlos [name], male    DOB: [birth year], 45 y.o.   MRN: [id number]  HPI Here to discuss the following  Recent tick bite umbilical area. Still has some mild itching but no rash. He had some surrounding erythema and we treated with doxycycline. There was no suspicion for Lyme disease or [facility] spotted fever. There was a question some mild cellulitis. No fevers or chills. No arthralgias. No headache.  Patient recently noted a slightly raised pruritic papule left buttock. He wondered if he had a tick bite there as well. No associated pain. No pustule.  Recently, he has noted pressure induced urticaria on his extremities. He's never had this previously and was wondering if this somehow was related to his tick bite. He has not had any sensitization to allergy with red meat. He notices when he takes Benadryl he has fewer pressure induced urticaria  We incidentally noted atypical nevus left buttock today when examining place above. Has not had any prior history of skin cancer. He was not aware of this nevus. No itching or bleeding  Past Medical History  Diagnosis Date  . Depression   . Hyperlipidemia   . Alcohol abuse   . Kidney stones   . Type 2 diabetes mellitus ([place])   . ADHD (attention deficit hyperactivity disorder)   . Left ureteral stone   . History of kidney stones   . History of exercise stress test     03-18-2013--  normal   Past Surgical History  Procedure Laterality Date  . Cardiac catheterization  11-27-2006  dr Verlon Setting    non-obstructive CAD/  20% proximal and mid LAD/  perserved LVF,  ef 55-60%  . Extracorporeal shock wave lithotripsy Left 05-08-2015  . Extraction right mandibular , premolar/  maxillary mandibular fixation with screws  08-03-2008  . Orif four hold plate and maxillomandibular fixation w/ arch bars  08-05-2008    REMOVAL ARCH BARS 09-15-2008  . Transthoracic echocardiogram  04-06-2008  dr Verlon Setting    normal LVF,  ef 55-60%,   trivial MR and TR  . Cystoscopy/ureteroscopy/holmium laser/stent placement Left 05/17/2015    Procedure: LEFT URETEROSCOPY/HOLMIUM LASER/STENT PLACEMENT;  Surgeon: Festus Aloe, MD;  Location: [facility];  Service: Urology;  Laterality: Left;  . Cystoscopy w/ retrogrades Left 05/17/2015    Procedure: CYSTOSCOPY WITH RETROGRADE PYELOGRAM;  Surgeon: Festus Aloe, MD;  Location: [facility];  Service: Urology;  Laterality: Left;    reports that he has never smoked. He does not have any smokeless tobacco history on file. He reports that he drinks alcohol. He reports that he does not use illicit drugs. family history includes Alcohol abuse in his father, maternal grandfather, and maternal uncle; Diabetes in his maternal grandfather, maternal grandmother, and mother; Heart disease in his mother. No Known Allergies    Review of Systems  Constitutional: Negative for fever and chills.  Respiratory: Negative for shortness of breath.   Cardiovascular: Negative for chest pain.  Musculoskeletal: Negative for arthralgias.  Skin: Positive for rash.  Neurological: Negative for headaches.       Objective:   Physical Exam  Constitutional: He appears well-developed and well-nourished.  Cardiovascular: Normal rate and regular rhythm.   Pulmonary/Chest: Effort normal and breath sounds normal. No respiratory distress. He has no wheezes. He has no rales.  Skin:  Patient has small eschar about 4 mm left lateral hip region. No surrounding erythema. No pustule.  Just inferior to this  he has atypical nevus which is 7 mm diameter. This has some asymmetry and some border irregularity and some color variegation as well.          Assessment & Plan:  #1 recent tick bite. Local allergic reaction. Continue antihistamine.  No concern for tick related infection and he already completed course of Doxycycline.  #2 pressure induced urticaria. We have suggested he consider daily  antihistamine such as Zyrtec or Allegra.  #3 atypical nevus. He has multiple atypical features as above including asymmetry, border irregularity, color variegation, and diameter over 6 mm. Discussed risks and benefits of shave excision including risks of bleeding, bruising, scar formation, and infection and patient consented to proceed.  Anesthesia with 1% Xylocaine with epinephrine. Skin prepped with betadine and alcohol and shave excision of lesion with #15 blade with minimal bleeding.  Patient tolerated well.  Antibiotic and dressing  applied. Specimen sent to pathology for further evaluation. Wound care instruction given  Eulas Post MD [facility] at [facility]

## 2016-05-28 NOTE — Progress Notes (Signed)
Pre visit review using our clinic review tool, if applicable. No additional management support is needed unless otherwise documented below in the visit note. 

## 2016-06-10 ENCOUNTER — Telehealth: Payer: Self-pay | Admitting: Family Medicine

## 2016-06-10 MED ORDER — METFORMIN HCL 500 MG PO TABS: 1000.0000 mg | ORAL_TABLET | Freq: Two times a day (BID) | ORAL | Status: DC

## 2016-06-10 NOTE — Telephone Encounter (Signed)
Refill sent to pharmacy.   

## 2016-06-10 NOTE — Telephone Encounter (Signed)
Pt need new Rx for Metformin     Pharm:  [facility]

## 2016-09-03 ENCOUNTER — Other Ambulatory Visit: Payer: BLUE CROSS/BLUE SHIELD

## 2016-09-17 ENCOUNTER — Encounter: Payer: BLUE CROSS/BLUE SHIELD | Admitting: Family Medicine

## 2016-09-27 ENCOUNTER — Other Ambulatory Visit (INDEPENDENT_AMBULATORY_CARE_PROVIDER_SITE_OTHER): Payer: BLUE CROSS/BLUE SHIELD

## 2016-09-27 DIAGNOSIS — Z0000 Encounter for general adult medical examination without abnormal findings: Secondary | ICD-10-CM

## 2016-09-27 LAB — HEPATIC FUNCTION PANEL
ALK PHOS: 56 U/L (ref 39–117)
ALT: 10 U/L (ref 0–53)
AST: 13 U/L (ref 0–37)
Albumin: 4.1 g/dL (ref 3.5–5.2)
BILIRUBIN DIRECT: 0.1 mg/dL (ref 0.0–0.3)
Total Bilirubin: 0.5 mg/dL (ref 0.2–1.2)
Total Protein: 6.7 g/dL (ref 6.0–8.3)

## 2016-09-27 LAB — TSH: TSH: 2.88 u[IU]/mL (ref 0.35–4.50)

## 2016-09-27 LAB — CBC WITH DIFFERENTIAL/PLATELET
BASOS ABS: 0 10*3/uL (ref 0.0–0.1)
BASOS PCT: 0.6 % (ref 0.0–3.0)
EOS ABS: 0.2 10*3/uL (ref 0.0–0.7)
Eosinophils Relative: 3.5 % (ref 0.0–5.0)
HCT: 42.3 % (ref 39.0–52.0)
Hemoglobin: 14.6 g/dL (ref 13.0–17.0)
Lymphocytes Relative: 34.4 % (ref 12.0–46.0)
Lymphs Abs: 1.6 10*3/uL (ref 0.7–4.0)
MCHC: 34.7 g/dL (ref 30.0–36.0)
MCV: 93.7 fl (ref 78.0–100.0)
Monocytes Absolute: 0.3 10*3/uL (ref 0.1–1.0)
Monocytes Relative: 6.9 % (ref 3.0–12.0)
NEUTROS ABS: 2.6 10*3/uL (ref 1.4–7.7)
NEUTROS PCT: 54.6 % (ref 43.0–77.0)
PLATELETS: 218 10*3/uL (ref 150.0–400.0)
RBC: 4.52 Mil/uL (ref 4.22–5.81)
RDW: 13.3 % (ref 11.5–15.5)
WBC: 4.7 10*3/uL (ref 4.0–10.5)

## 2016-09-27 LAB — BASIC METABOLIC PANEL
BUN: 12 mg/dL (ref 6–23)
CALCIUM: 9.4 mg/dL (ref 8.4–10.5)
CHLORIDE: 101 meq/L (ref 96–112)
CO2: 30 meq/L (ref 19–32)
CREATININE: 0.87 mg/dL (ref 0.40–1.50)
GFR: 101.21 mL/min (ref >60.00)
GLUCOSE: 137 mg/dL — AB (ref 70–99)
Potassium: 3.7 mEq/L (ref 3.5–5.1)
Sodium: 138 mEq/L (ref 135–145)

## 2016-09-27 LAB — LIPID PANEL
Cholesterol: 191 mg/dL (ref 0–200)
HDL: 46.9 mg/dL (ref >39.00)
LDL Cholesterol: 118 mg/dL — ABNORMAL HIGH (ref 0–99)
NONHDL: 144.37
TRIGLYCERIDES: 133 mg/dL (ref 0.0–149.0)
Total CHOL/HDL Ratio: 4
VLDL: 26.6 mg/dL (ref 0.0–40.0)

## 2016-09-27 LAB — MICROALBUMIN / CREATININE URINE RATIO
Creatinine,U: 66.7 mg/dL
Microalb Creat Ratio: 1 mg/g (ref 0.0–30.0)
Microalb, Ur: <0.7 mg/dL (ref 0.0–1.9)

## 2016-09-27 LAB — HEMOGLOBIN A1C: Hgb A1c MFr Bld: 6.5 % (ref 4.6–6.5)

## 2016-09-30 ENCOUNTER — Encounter: Payer: Self-pay | Admitting: Family Medicine

## 2016-09-30 ENCOUNTER — Ambulatory Visit (INDEPENDENT_AMBULATORY_CARE_PROVIDER_SITE_OTHER): Payer: BLUE CROSS/BLUE SHIELD | Admitting: Family Medicine

## 2016-09-30 VITALS — BP 90/64 | HR 88 | Temp 98.0°F | Ht 76.0 in | Wt 149.5 lb

## 2016-09-30 DIAGNOSIS — Z0000 Encounter for general adult medical examination without abnormal findings: Secondary | ICD-10-CM

## 2016-09-30 MED ORDER — PAROXETINE HCL 20 MG PO TABS: 20.0000 mg | ORAL_TABLET | Freq: Every day | ORAL | 5 refills | Status: DC

## 2016-09-30 NOTE — Progress Notes (Signed)
Subjective:     Patient ID: Carlos [name], male   DOB: [birth year], 45 y.o.   MRN: [id number]  HPI Reason seen for physical exam. He has history of type 2 diabetes, attention deficit disorder, past history of alcohol abuse. He has essentially eliminated alcohol. He has had some weight loss or in the past year he attributes this to anxiety and depressed mood. No suicidal ideation. He goes through stretches where appetite is very poor. He has lost documented 15 pounds since last year.  Diabetes been well controlled. No hypoglycemic symptoms. Has never had Pneumovax. He declines Pneumovax and flu vaccine today.  Past Medical History:  Diagnosis Date  . ADHD (attention deficit hyperactivity disorder)   . Alcohol abuse   . Depression   . History of exercise stress test    03-18-2013--  normal  . History of kidney stones   . Hyperlipidemia   . Kidney stones   . Left ureteral stone   . Type 2 diabetes mellitus ([place])    Past Surgical History:  Procedure Laterality Date  . CARDIAC CATHETERIZATION  11-27-2006  dr Verlon Setting   non-obstructive CAD/  20% proximal and mid LAD/  perserved LVF,  ef 55-60%  . CYSTOSCOPY W/ RETROGRADES Left 05/17/2015   Procedure: CYSTOSCOPY WITH RETROGRADE PYELOGRAM;  Surgeon: Festus Aloe, MD;  Location: [facility];  Service: Urology;  Laterality: Left;  . CYSTOSCOPY/URETEROSCOPY/HOLMIUM LASER/STENT PLACEMENT Left 05/17/2015   Procedure: LEFT URETEROSCOPY/HOLMIUM LASER/STENT PLACEMENT;  Surgeon: Festus Aloe, MD;  Location: [facility];  Service: Urology;  Laterality: Left;  . EXTRACORPOREAL SHOCK WAVE LITHOTRIPSY Left 05-08-2015  . EXTRACTION RIGHT MANDIBULAR , PREMOLAR/  MAXILLARY MANDIBULAR FIXATION WITH SCREWS  08-03-2008  . ORIF FOUR HOLD PLATE AND MAXILLOMANDIBULAR FIXATION W/ ARCH BARS  08-05-2008   REMOVAL ARCH BARS 09-15-2008  . TRANSTHORACIC ECHOCARDIOGRAM  04-06-2008  dr Verlon Setting   normal LVF,  ef 55-60%,  trivial MR and TR     reports that he has never smoked. He has never used smokeless tobacco. He reports that he drinks alcohol. He reports that he does not use drugs. family history includes Alcohol abuse in his father, maternal grandfather, and maternal uncle; Diabetes in his maternal grandfather, maternal grandmother, and mother; Heart disease in his mother. No Known Allergies   Review of Systems  Constitutional: Positive for appetite change and unexpected weight change. Negative for activity change, chills, fatigue and fever.  HENT: Negative for congestion, ear pain and trouble swallowing.   Eyes: Negative for pain and visual disturbance.  Respiratory: Negative for cough, shortness of breath and wheezing.   Cardiovascular: Negative for chest pain and palpitations.  Gastrointestinal: Negative for abdominal distention, abdominal pain, blood in stool, constipation, diarrhea, nausea, rectal pain and vomiting.  Genitourinary: Negative for dysuria, hematuria and testicular pain.  Musculoskeletal: Negative for arthralgias and joint swelling.  Skin: Negative for rash.  Neurological: Negative for dizziness, syncope and headaches.  Hematological: Negative for adenopathy.  Psychiatric/Behavioral: Positive for dysphoric mood. Negative for agitation, confusion and suicidal ideas. The patient is nervous/anxious.        Objective:   Physical Exam  Constitutional: He is oriented to person, place, and time. He appears well-developed and well-nourished. No distress.  HENT:  Head: Normocephalic and atraumatic.  Right Ear: External ear normal.  Left Ear: External ear normal.  Mouth/Throat: Oropharynx is clear and moist.  Eyes: Conjunctivae and EOM are normal. Pupils are equal, round, and reactive to light.  Neck: Normal range of  motion. Neck supple. No thyromegaly present.  Cardiovascular: Normal rate, regular rhythm and normal heart sounds.   No murmur heard. Pulmonary/Chest: No respiratory distress. He has no  wheezes. He has no rales.  Abdominal: Soft. Bowel sounds are normal. He exhibits no distension and no mass. There is no tenderness. There is no rebound and no guarding.  Musculoskeletal: He exhibits no edema.  Lymphadenopathy:    He has no cervical adenopathy.  Neurological: He is alert and oriented to person, place, and time. He displays normal reflexes. No cranial nerve deficit.  Skin: No rash noted.  Psychiatric: He has a normal mood and affect. His behavior is normal. Judgment and thought content normal.       Assessment:     Physical exam. Labs reviewed with patient with no major concerns. He has had some weight loss and has some concurrent depression symptoms which may be playing a significant role    Plan:     -Continue current diabetes medications. -Flu vaccine and Pneumovax recommended but declined by patient -Start Paxil 20 mg once daily and reassess in 4 weeks. Hopefully this will help with his anxiety symptoms, depression, and difficulties with weight gain  Eulas Post MD [facility] at [facility]

## 2016-09-30 NOTE — Patient Instructions (Signed)
Set up eye exam at some point this year.

## 2016-11-04 ENCOUNTER — Ambulatory Visit: Payer: BLUE CROSS/BLUE SHIELD | Admitting: Family Medicine

## 2016-12-13 ENCOUNTER — Ambulatory Visit: Payer: BLUE CROSS/BLUE SHIELD | Admitting: Family Medicine

## 2017-02-01 ENCOUNTER — Other Ambulatory Visit: Payer: Self-pay | Admitting: Family Medicine

## 2017-03-31 ENCOUNTER — Other Ambulatory Visit: Payer: Self-pay | Admitting: Family Medicine

## 2017-06-06 ENCOUNTER — Encounter: Payer: Self-pay | Admitting: Family Medicine

## 2017-06-06 ENCOUNTER — Ambulatory Visit (INDEPENDENT_AMBULATORY_CARE_PROVIDER_SITE_OTHER): Payer: BLUE CROSS/BLUE SHIELD | Admitting: Family Medicine

## 2017-06-06 VITALS — BP 142/88 | HR 103 | Resp 12 | Ht 76.0 in | Wt 152.4 lb

## 2017-06-06 DIAGNOSIS — S50362A Insect bite (nonvenomous) of left elbow, initial encounter: Secondary | ICD-10-CM | POA: Diagnosis not present

## 2017-06-06 DIAGNOSIS — W57XXXA Bitten or stung by nonvenomous insect and other nonvenomous arthropods, initial encounter: Secondary | ICD-10-CM

## 2017-06-06 DIAGNOSIS — L03114 Cellulitis of left upper limb: Secondary | ICD-10-CM | POA: Diagnosis not present

## 2017-06-06 DIAGNOSIS — R030 Elevated blood-pressure reading, without diagnosis of hypertension: Secondary | ICD-10-CM | POA: Diagnosis not present

## 2017-06-06 MED ORDER — TRIAMCINOLONE ACETONIDE 0.1 % EX CREA: 1.0000 "application " | TOPICAL_CREAM | Freq: Two times a day (BID) | CUTANEOUS | 0 refills | Status: AC

## 2017-06-06 MED ORDER — SULFAMETHOXAZOLE-TRIMETHOPRIM 800-160 MG PO TABS: 1.0000 | ORAL_TABLET | Freq: Two times a day (BID) | ORAL | 0 refills | Status: AC

## 2017-06-06 NOTE — Progress Notes (Signed)
HPI:  ACUTE VISIT:  Chief Complaint  Patient presents with  . Insect Bite    Carlos [name] is a 46 y.o. male, who is here today complaining of tender and erythematous elbow he noted yesterday around 8-9 pm. He sat on chair outdoor and when leaning elbow against arm of chair her felt pain. Noted small raise lesion and erythema,which he attributes to insect bite, he thinks it might have been a spider.  He did not notice insets or spiders around him and he is not aware of outdoor exposure to plants. No tick bites.  Pain is constant now (2/10), soreness, sharp sometimes. It isexacerbated by palpation and elbow flexion, severe. Alleviated by avoiding touch and rest. He denies recent trauma.   Rash  This is a new problem. The current episode started yesterday. The problem has been rapidly worsening since onset. The affected locations include the left elbow. The rash is characterized by pain, redness and swelling. It is unknown if there was an exposure to a precipitant. Associated symptoms include joint pain. Pertinent negatives include no congestion, diarrhea, facial edema, fatigue, fever, nail changes, rhinorrhea, shortness of breath, sore throat or vomiting. Past treatments include nothing.   Hx of DM II and alcohol abuse. He denies Hx of gout.  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 Denies severe/frequent headache, visual changes, chest pain, dyspnea, palpitation, claudication, focal weakness, or edema.  He is leaving town [employer], going to [place] for family vacation.   Review of Systems  Constitutional: Negative for appetite change, fatigue and fever.  HENT: Negative for congestion, facial swelling, mouth sores, rhinorrhea and sore throat.   Eyes: Negative for redness, itching and visual disturbance.  Respiratory: Negative for chest tightness, shortness of breath and wheezing.     Cardiovascular: Negative for palpitations and leg swelling.  Gastrointestinal: Negative for abdominal pain, diarrhea, nausea and vomiting.  Genitourinary: Negative for decreased urine volume and hematuria.  Musculoskeletal: Positive for joint pain and joint swelling. Negative for gait problem, myalgias and neck pain.  Skin: Positive for rash. Negative for nail changes and wound.  Neurological: Negative for seizures, weakness, numbness and headaches.  Hematological: Negative for adenopathy. Does not bruise/bleed easily.      Current Outpatient Prescriptions on File Prior to Visit  Medication Sig Dispense Refill  . aspirin 81 MG tablet Take 81 mg by mouth every morning.     Marland Kitchen glucose blood (ONETOUCH VERIO) test strip Test once daily Dx E11.9 100 each 3  . JARDIANCE 10 MG TABS tablet TAKE ONE TABLET BY MOUTH ONCE DAILY 30 tablet 5  . metFORMIN (GLUCOPHAGE) 500 MG tablet TAKE TWO TABLETS BY MOUTH TWICE DAILY WITH A MEAL 120 tablet 1  . Multiple Vitamin (MULTIVITAMIN WITH MINERALS) TABS tablet Take 1 tablet by mouth every morning.     Marland Kitchen ONE TOUCH LANCETS MISC Test once daily.  Onetouch Verio Flex E11.9 100 each 3  . PARoxetine (PAXIL) 20 MG tablet Take 1 tablet (20 mg total) by mouth daily. 30 tablet 5   No current facility-administered medications on file prior to visit.      Past Medical History:  Diagnosis Date  . ADHD (attention deficit hyperactivity disorder)   . Alcohol abuse   . Depression   . History of exercise stress test    03-18-2013--  normal  . History of kidney stones   . Hyperlipidemia   .  Kidney stones   . Left ureteral stone   . Type 2 diabetes mellitus ([facility])    No Known Allergies  Social History   Social History  . Marital status: Married    Spouse name: N/A  . Number of children: N/A  . Years of education: N/A   Social History Main Topics  . Smoking status: Never Smoker  . Smokeless tobacco: Never Used  . Alcohol use Yes     Comment: casual  .  Drug use: No  . Sexual activity: Not Asked   Other Topics Concern  . None   Social History Narrative  . None    Vitals:   06/06/17 1208  BP: (!) 142/88  Pulse: (!) 103  Resp: 12  O2 sat at RA 98% Body mass index is 18.56 kg/m.   Physical Exam  Nursing note and vitals reviewed. Constitutional: He is oriented to person, place, and time. He appears well-developed. He does not appear ill. No distress.  HENT:  Head: Atraumatic.  Mouth/Throat: Oropharynx is clear and moist and mucous membranes are normal.  Eyes: Conjunctivae and EOM are normal.  Cardiovascular: Regular rhythm.   Pulses:      Radial pulses are 2+ on the left side.  Mild tachycardia.  Respiratory: Effort normal and breath sounds normal. No respiratory distress.  Musculoskeletal: He exhibits no edema.       Left forearm: He exhibits tenderness. He exhibits no bony tenderness.       Arms: Tenderness upon palpation of olecranon left elbow where erythema is localized. No limitation of ROM but flexion elicits pain.  Lymphadenopathy:    He has no cervical adenopathy.  Neurological: He is alert and oriented to person, place, and time. Gait normal.  Skin: Skin is warm. Rash noted. No abrasion and no ecchymosis noted. Rash is papular. Rash is not vesicular. There is erythema.  1-2 papular lesion in middle of elbow, posterior aspect.Surrounded erythema (3-4 cm diameter. No fluctuant area appreciated, tender. No drainage or induration. + Local heat.  He has other 2 plaque erythematous lesions, which he reports as chronic. On dorsal aspect of forearm, 2 cm max, no tender.  Psychiatric: He has a normal mood and affect.  Well groomed, good eye contact.    ASSESSMENT AND PLAN:   Jas was seen today for insect bite.  Diagnoses and all orders for this visit:  Insect bite of left elbow with local reaction, initial encounter  Findings on examination certainly suggest insect bite, it is difficult at this point to know  exactly what type of insect caused lesion. Local ice x 48-78 hours. Topical steroid might help with local reaction, recommended applying small dose twice per day for up to 2 weeks.   -     triamcinolone cream (KENALOG) 0.1 %; Apply 1 application topically 2 (two) times daily.  Cellulitis of left elbow  Because history of diabetes as well as worsening erythema abx was recommended. Clearly instructed about warning signs. Recommend keeping area clean with soap and water. He would be back to town in 10 days, follow-up with PCP recommended.  -     sulfamethoxazole-trimethoprim (BACTRIM DS,SEPTRA DS) 800-160 MG tablet; Take 1 tablet by mouth 2 (two) times daily.  Elevated blood pressure reading  Follow-up in 10 days. Recommend adequate hydration, low-salt diet. If possible monitor BP at home.   Return in about 10 days (around 06/16/2017) for left elbow cellulitis with PCP..    -Mr.Custer Pimenta Degollado was advised to seek  immediate medical attention is symptoms suddenly get worse.      Hayato Guaman G. [place], MD  [facility]. [place] office.

## 2017-06-06 NOTE — Patient Instructions (Signed)
  Carlos [name] I have seen you today for an acute visit.  A few things to remember from today's visit:   Cellulitis of left elbow - Plan: sulfamethoxazole-trimethoprim (BACTRIM DS,SEPTRA DS) 800-160 MG tablet  Insect bite of left elbow with local reaction, initial encounter - Plan: triamcinolone cream (KENALOG) 0.1 %      Medications prescribed today are intended for short period of time and will not be refill upon request, a follow up appointment might be necessary to discuss continuation of of treatment if appropriate.     In general please monitor for signs of worsening symptoms and seek immediate medical attention if any concerning.  10 days follow up.  Please be sure you have an appointment already scheduled with your PCP before you leave today.

## 2017-06-23 ENCOUNTER — Other Ambulatory Visit: Payer: Self-pay | Admitting: Family Medicine

## 2017-08-13 ENCOUNTER — Other Ambulatory Visit: Payer: Self-pay | Admitting: Family Medicine

## 2017-08-19 ENCOUNTER — Ambulatory Visit: Payer: BLUE CROSS/BLUE SHIELD | Admitting: Family Medicine

## 2017-08-19 DIAGNOSIS — Z0289 Encounter for other administrative examinations: Secondary | ICD-10-CM

## 2017-08-26 ENCOUNTER — Ambulatory Visit: Payer: BLUE CROSS/BLUE SHIELD | Admitting: Family Medicine

## 2017-08-26 ENCOUNTER — Ambulatory Visit (INDEPENDENT_AMBULATORY_CARE_PROVIDER_SITE_OTHER): Payer: BLUE CROSS/BLUE SHIELD | Admitting: Family Medicine

## 2017-08-26 ENCOUNTER — Encounter: Payer: Self-pay | Admitting: Family Medicine

## 2017-08-26 VITALS — BP 110/70 | HR 100 | Temp 98.0°F | Wt 162.3 lb

## 2017-08-26 DIAGNOSIS — E1165 Type 2 diabetes mellitus with hyperglycemia: Secondary | ICD-10-CM | POA: Diagnosis not present

## 2017-08-26 DIAGNOSIS — E119 Type 2 diabetes mellitus without complications: Secondary | ICD-10-CM

## 2017-08-26 DIAGNOSIS — E785 Hyperlipidemia, unspecified: Secondary | ICD-10-CM | POA: Diagnosis not present

## 2017-08-26 DIAGNOSIS — R5383 Other fatigue: Secondary | ICD-10-CM

## 2017-08-26 LAB — HEPATIC FUNCTION PANEL
ALBUMIN: 3.8 g/dL (ref 3.5–5.2)
ALT: 9 U/L (ref 0–53)
AST: 13 U/L (ref 0–37)
Alkaline Phosphatase: 36 U/L — ABNORMAL LOW (ref 39–117)
Bilirubin, Direct: 0.1 mg/dL (ref 0.0–0.3)
Total Bilirubin: 0.4 mg/dL (ref 0.2–1.2)
Total Protein: 6.4 g/dL (ref 6.0–8.3)

## 2017-08-26 LAB — CBC WITH DIFFERENTIAL/PLATELET
BASOS PCT: 0.6 % (ref 0.0–3.0)
Basophils Absolute: 0 10*3/uL (ref 0.0–0.1)
EOS PCT: 3.3 % (ref 0.0–5.0)
Eosinophils Absolute: 0.2 10*3/uL (ref 0.0–0.7)
HCT: 43.3 % (ref 39.0–52.0)
Hemoglobin: 14.6 g/dL (ref 13.0–17.0)
LYMPHS ABS: 1.4 10*3/uL (ref 0.7–4.0)
Lymphocytes Relative: 28.6 % (ref 12.0–46.0)
MCHC: 33.6 g/dL (ref 30.0–36.0)
MCV: 99.2 fl (ref 78.0–100.0)
MONOS PCT: 7.1 % (ref 3.0–12.0)
Monocytes Absolute: 0.3 10*3/uL (ref 0.1–1.0)
NEUTROS ABS: 2.9 10*3/uL (ref 1.4–7.7)
NEUTROS PCT: 60.4 % (ref 43.0–77.0)
PLATELETS: 235 10*3/uL (ref 150.0–400.0)
RBC: 4.36 Mil/uL (ref 4.22–5.81)
RDW: 13 % (ref 11.5–15.5)
WBC: 4.8 10*3/uL (ref 4.0–10.5)

## 2017-08-26 LAB — LIPID PANEL
Cholesterol: 194 mg/dL (ref 0–200)
HDL: 30.6 mg/dL — AB (ref >39.00)
NonHDL: 163.07
TRIGLYCERIDES: 230 mg/dL — AB (ref 0.0–149.0)
Total CHOL/HDL Ratio: 6
VLDL: 46 mg/dL — ABNORMAL HIGH (ref 0.0–40.0)

## 2017-08-26 LAB — BASIC METABOLIC PANEL
BUN: 8 mg/dL (ref 6–23)
CALCIUM: 9.1 mg/dL (ref 8.4–10.5)
CO2: 24 meq/L (ref 19–32)
Chloride: 100 mEq/L (ref 96–112)
Creatinine, Ser: 1 mg/dL (ref 0.40–1.50)
GFR: 85.83 mL/min (ref >60.00)
Glucose, Bld: 244 mg/dL — ABNORMAL HIGH (ref 70–99)
POTASSIUM: 3.6 meq/L (ref 3.5–5.1)
SODIUM: 138 meq/L (ref 135–145)

## 2017-08-26 LAB — MICROALBUMIN / CREATININE URINE RATIO
Creatinine,U: 91.7 mg/dL
Microalb Creat Ratio: 0.8 mg/g (ref 0.0–30.0)
Microalb, Ur: <0.7 mg/dL (ref 0.0–1.9)

## 2017-08-26 LAB — HEMOGLOBIN A1C: HEMOGLOBIN A1C: 7.1 % — AB (ref 4.6–6.5)

## 2017-08-26 LAB — LDL CHOLESTEROL, DIRECT: Direct LDL: 130 mg/dL

## 2017-08-26 LAB — TSH: TSH: 1.87 u[IU]/mL (ref 0.35–4.50)

## 2017-08-26 MED ORDER — METFORMIN HCL 500 MG PO TABS: 1000.0000 mg | ORAL_TABLET | Freq: Two times a day (BID) | ORAL | 3 refills | Status: DC

## 2017-08-26 MED ORDER — EMPAGLIFLOZIN 10 MG PO TABS: 10.0000 mg | ORAL_TABLET | Freq: Every day | ORAL | 3 refills | Status: DC

## 2017-08-26 NOTE — Progress Notes (Signed)
Subjective:     Patient ID: Carlos [name], male   DOB: Oct 04, 1971, 46 y.o.   MRN: [id number]  HPI   Patient seen for medical follow-up. He has history of type 2 diabetes, dyslipidemia, remote history of alcohol abuse. He's been totally absent from alcohol for several months. He is due for labs. Current medications are aspirin 81 mg daily, Jardiance 10 mg daily, and metformin 500 mg 2 tablets twice daily.Blood sugars vary but most fastings are well controlled.  He needs to set up eye exam. No recent blurred vision. He did have recent subconjunctival hemorrhage on the left but no blurred vision. He has declined statin use in the past for his mild dyslipidemia. Triglycerides improved after discontinuing alcohol.  Declines flu vaccine.  Does complain of some fatigue. He thinks is maybe due to poor sleep quality. Usually gets about 7/2-8 hours per night. Appetite and weight stable  Past Medical History:  Diagnosis Date  . ADHD (attention deficit hyperactivity disorder)   . Alcohol abuse   . Depression   . History of exercise stress test    03-18-2013--  normal  . History of kidney stones   . Hyperlipidemia   . Kidney stones   . Left ureteral stone   . Type 2 diabetes mellitus ([place])    Past Surgical History:  Procedure Laterality Date  . CARDIAC CATHETERIZATION  11-27-2006  dr Verlon Setting   non-obstructive CAD/  20% proximal and mid LAD/  perserved LVF,  ef 55-60%  . CYSTOSCOPY W/ RETROGRADES Left 05/17/2015   Procedure: CYSTOSCOPY WITH RETROGRADE PYELOGRAM;  Surgeon: Festus Aloe, MD;  Location: [facility];  Service: Urology;  Laterality: Left;  . CYSTOSCOPY/URETEROSCOPY/HOLMIUM LASER/STENT PLACEMENT Left 05/17/2015   Procedure: LEFT URETEROSCOPY/HOLMIUM LASER/STENT PLACEMENT;  Surgeon: Festus Aloe, MD;  Location: [facility];  Service: Urology;  Laterality: Left;  . EXTRACORPOREAL SHOCK WAVE LITHOTRIPSY Left 05-08-2015  . EXTRACTION RIGHT MANDIBULAR ,  PREMOLAR/  MAXILLARY MANDIBULAR FIXATION WITH SCREWS  08-03-2008  . ORIF FOUR HOLD PLATE AND MAXILLOMANDIBULAR FIXATION W/ ARCH BARS  08-05-2008   REMOVAL ARCH BARS 09-15-2008  . TRANSTHORACIC ECHOCARDIOGRAM  04-06-2008  dr Verlon Setting   normal LVF,  ef 55-60%,  trivial MR and TR    reports that he has never smoked. He has never used smokeless tobacco. He reports that he drinks alcohol. He reports that he does not use drugs. family history includes Alcohol abuse in his father, maternal grandfather, and maternal uncle; Diabetes in his maternal grandfather, maternal grandmother, and mother; Heart disease in his mother. No Known Allergies   Review of Systems  Constitutional: Negative for fatigue.  Eyes: Negative for visual disturbance.  Respiratory: Negative for cough, chest tightness and shortness of breath.   Cardiovascular: Negative for chest pain, palpitations and leg swelling.  Endocrine: Negative for polydipsia and polyuria.  Neurological: Negative for dizziness, syncope, weakness, light-headedness and headaches.       Objective:   Physical Exam  Constitutional: He is oriented to person, place, and time. He appears well-developed and well-nourished.  HENT:  Right Ear: External ear normal.  Left Ear: External ear normal.  Mouth/Throat: Oropharynx is clear and moist.  Eyes: Pupils are equal, round, and reactive to light.  Small benign-appearing left subconjunctival hemorrhage. No hyphema. Right eye exam is normal  Neck: Neck supple. No thyromegaly present.  Cardiovascular: Normal rate and regular rhythm.   Pulmonary/Chest: Effort normal and breath sounds normal. No respiratory distress. He has no wheezes. He has  no rales.  Musculoskeletal: He exhibits no edema.  Neurological: He is alert and oriented to person, place, and time.       Assessment:     #1 type 2 diabetes. Recent good control  #2 history of dyslipidemia  #3 remote history of alcohol abuse  #4 benign left  subconjunctival hemorrhage    Plan:     -Set up eye exam -Obtain labs including hemoglobin A1c, urine micro-albumin, lipid, hepatic panel, TSH, CBC -Routine follow-up in 6 months and sooner as needed -Flu vaccine encouraged but he declines  Eulas Post MD [facility]

## 2017-09-04 ENCOUNTER — Ambulatory Visit (INDEPENDENT_AMBULATORY_CARE_PROVIDER_SITE_OTHER): Payer: BLUE CROSS/BLUE SHIELD | Admitting: Family Medicine

## 2017-09-04 ENCOUNTER — Encounter: Payer: Self-pay | Admitting: Family Medicine

## 2017-09-04 VITALS — BP 108/68 | HR 83 | Temp 98.4°F | Ht 76.0 in | Wt 158.0 lb

## 2017-09-04 DIAGNOSIS — K625 Hemorrhage of anus and rectum: Secondary | ICD-10-CM

## 2017-09-04 NOTE — Patient Instructions (Signed)
Please stop by lab before you go - make sure no significant anemia (doubt) - please let us know if you have increased amount of blood or if it continues into next week. Also let us know if chest pain, shortness of breath, lightheadedness- potential signs of anemia - hold aspirin until after bleeding has stopped for a few days - suspect internal hemorrhoid. You declined the anoscope exam.  - this is reasonable considering your age- will go ahead and get colonoscopy

## 2017-09-04 NOTE — Progress Notes (Signed)
Subjective:  Carlos [name] is a 46 y.o. year old very pleasant male patient who presents for/with See problem oriented charting ROS- No chest pain or shortness of breath. No headache or blurry vision. No dizziness.    Past Medical History-  Patient Active Problem List   Diagnosis Date Noted  . Type 2 diabetes mellitus, uncontrolled ([place]) 05/12/2014  . Varicose veins 05/12/2014  . ADD (attention deficit disorder) 04/07/2013  . Alcohol abuse 12/23/2012  . History of depression 12/23/2012  . Dyslipidemia 12/23/2012    Medications- reviewed and updated Current Outpatient Prescriptions  Medication Sig Dispense Refill  . aspirin 81 MG tablet Take 81 mg by mouth every morning.     . empagliflozin (JARDIANCE) 10 MG TABS tablet Take 10 mg by mouth daily. 90 tablet 3  . glucose blood (ONETOUCH VERIO) test strip Test once daily Dx E11.9 100 each 3  . metFORMIN (GLUCOPHAGE) 500 MG tablet Take 2 tablets (1,000 mg total) by mouth 2 (two) times daily with a meal. 360 tablet 3  . Multiple Vitamin (MULTIVITAMIN WITH MINERALS) TABS tablet Take 1 tablet by mouth every morning.     Marland Kitchen ONE TOUCH LANCETS MISC Test once daily.  Onetouch Verio Flex E11.9 100 each 3   Objective: BP 108/68 (BP Location: Left Arm, Patient Position: Sitting, Cuff Size: Normal)   Pulse 83   Temp 98.4 F (36.9 C) (Oral)   Ht 6' 4"  (1.93 m)   Wt 158 lb (71.7 kg)   SpO2 96%   BMI 19.23 kg/m  Gen: NAD, resting comfortably CV: RRR no murmurs rubs or gallops Lungs: CTAB no crackles, wheeze, rhonchi Abdomen: soft/nontender/nondistended/normal bowel sounds. No rebound or guarding.  Ext: no edema Neuro: good rectal tone Rectal: no obvious hemorrhoids or fissures. He declines internal exam or anoscope.   Assessment/Plan:  Bright red blood per rectum - Plan: CBC, Ambulatory referral to Gastroenterology S: Patient with bright red blood in stool today. Normal BM at breakfast. Noted some mild cramping and had to pull over-  usually goes 2-3 x before noon on metormin. Noted blood in the toilet. Since then mildest of abdominal cramping. History of hemorrhoids but usually only with straining and denies constipation- both of those times were on pain meds. Blood was both in the toilet bowl and when he wiped.   No family history of colon cancer.  A/P: new rectal bleeding. Given new ACS guidelines plus rectal bleeding and patient declining anoscope refer to GI for likely colonoscopy. Hold aspirin until bleeding resolves. CBC to rule out significant drop in hgb Patient Instructions  Please stop by lab before you go - make sure no significant anemia (doubt) - please let us know if you have increased amount of blood or if it continues into next week. Also let us know if chest pain, shortness of breath, lightheadedness- potential signs of anemia - hold aspirin until after bleeding has stopped for a few days - suspect internal hemorrhoid. You declined the anoscope exam.  - this is reasonable considering your age- will go ahead and get colonoscopy   Future Appointments Date Time Provider [place]  12/02/2017 8:45 AM Burchette, Alinda Sierras, MD [id number]   Orders Placed This Encounter  Procedures  . CBC    [place]  . Ambulatory referral to Gastroenterology    Referral Priority:   Routine    Referral Type:   Consultation    Referral Reason:   Specialty Services Required    Number of Visits Requested:  1   Return precautions advised.  Garret Reddish, MD

## 2017-09-05 LAB — CBC
HEMATOCRIT: 44.6 % (ref 39.0–52.0)
Hemoglobin: 15 g/dL (ref 13.0–17.0)
MCHC: 33.7 g/dL (ref 30.0–36.0)
MCV: 99.5 fl (ref 78.0–100.0)
Platelets: 246 10*3/uL (ref 150.0–400.0)
RBC: 4.48 Mil/uL (ref 4.22–5.81)
RDW: 12.9 % (ref 11.5–15.5)
WBC: 7.2 10*3/uL (ref 4.0–10.5)

## 2017-09-09 ENCOUNTER — Ambulatory Visit: Payer: BLUE CROSS/BLUE SHIELD | Admitting: Family Medicine

## 2017-09-09 ENCOUNTER — Encounter: Payer: Self-pay | Admitting: Family Medicine

## 2017-09-18 ENCOUNTER — Telehealth: Payer: Self-pay | Admitting: *Deleted

## 2017-09-18 ENCOUNTER — Other Ambulatory Visit: Payer: Self-pay | Admitting: Adult Health

## 2017-09-18 NOTE — Telephone Encounter (Signed)
Patient called requesting an antibiotic for a uti, Please advise [phone]

## 2017-09-18 NOTE — Telephone Encounter (Signed)
Noted  

## 2017-09-18 NOTE — Telephone Encounter (Signed)
Pt decline to make an appt now

## 2017-09-18 NOTE — Telephone Encounter (Signed)
Unable to leave a message due to voicemail being full.

## 2017-09-18 NOTE — Telephone Encounter (Signed)
He needs to be seen for an antibiotic

## 2017-09-23 ENCOUNTER — Ambulatory Visit (INDEPENDENT_AMBULATORY_CARE_PROVIDER_SITE_OTHER): Payer: BLUE CROSS/BLUE SHIELD | Admitting: Family Medicine

## 2017-09-23 ENCOUNTER — Encounter: Payer: Self-pay | Admitting: Family Medicine

## 2017-09-23 VITALS — BP 90/62 | HR 78 | Resp 12 | Ht 76.0 in | Wt 162.0 lb

## 2017-09-23 DIAGNOSIS — R3129 Other microscopic hematuria: Secondary | ICD-10-CM | POA: Diagnosis not present

## 2017-09-23 DIAGNOSIS — N200 Calculus of kidney: Secondary | ICD-10-CM

## 2017-09-23 DIAGNOSIS — R350 Frequency of micturition: Secondary | ICD-10-CM

## 2017-09-23 DIAGNOSIS — R300 Dysuria: Secondary | ICD-10-CM | POA: Diagnosis not present

## 2017-09-23 LAB — POCT URINALYSIS DIPSTICK
Bilirubin, UA: NEGATIVE
KETONES UA: NEGATIVE
Leukocytes, UA: NEGATIVE
Nitrite, UA: NEGATIVE
Protein, UA: NEGATIVE
SPEC GRAV UA: 1.02 (ref 1.010–1.025)
Urobilinogen, UA: 0.2 E.U./dL
pH, UA: 6 (ref 5.0–8.0)

## 2017-09-23 NOTE — Patient Instructions (Addendum)
Mr.Daksh S Auld I have seen you today for an acute visit.  A few things to remember from today's visit:   Dysuria - Plan: POCT urinalysis dipstick, Urine culture  Microscopic hematuria  Urinary frequency  Nephrolithiasis   Medications prescribed today are intended for short period of time and will not be refill upon request, a follow up appointment might be necessary to discuss continuation of of treatment if appropriate.   Will follow urine culture. [place] Urinary Frequency, Adult Urinary frequency means urinating more often than usual. People with urinary frequency urinate at least 8 times in 24 hours, even if they drink a normal amount of fluid. Although they urinate more often than normal, the total amount of urine produced in a day may be normal. Urinary frequency is also called pollakiuria. What are the causes? This condition may be caused by:  A urinary tract infection.  Obesity.  Bladder problems, such as bladder stones.  Caffeine or alcohol.  Eating food or drinking fluids that irritate the bladder. These include coffee, tea, soda, artificial sweeteners, citrus, tomato-based foods, and chocolate.  Certain medicines, such as medicines that help the body get rid of extra fluid (diuretics).  Muscle or nerve weakness.  Overactive bladder.  Chronic diabetes.  Interstitial cystitis.  In men, problems with the prostate, such as an enlarged prostate.  In women, pregnancy.  In some cases, the cause may not be known. What increases the risk? This condition is more likely to develop in:  Women who have gone through menopause.  Men with prostate problems.  People with a disease or injury that affects the nerves or spinal cord.  People who have or have had a condition that affects the brain, such as a stroke.  What are the signs or symptoms? Symptoms of this condition include:  Feeling an urgent need to urinate often. The stress and anxiety of needing to  find a bathroom quickly can make this urge worse.  Urinating 8 or more times in 24 hours.  Urinating as often as every 1 to 2 hours.  How is this diagnosed? This condition is diagnosed based on your symptoms, your medical history, and a physical exam. You may have tests, such as:  Blood tests.  Urine tests.  Imaging tests, such as X-rays or ultrasounds.  A bladder test.  A test of your neurological system. This is the body system that senses the need to urinate.  A test to check for problems in the urethra and bladder called cystoscopy.  You may also be asked to keep a bladder diary. A bladder diary is a record of what you eat and drink, how often you urinate, and how much you urinate. You may need to see a health care provider who specializes in conditions of the urinary tract (urologist) or kidneys (nephrologist). How is this treated? Treatment for this condition depends on the cause. Sometimes the condition goes away on its own and treatment is not necessary. If treatment is needed, it may include:  Taking medicine.  Learning exercises that strengthen the muscles that help control urination.  Following a bladder training program. This may include: ? Learning to delay going to the bathroom. ? Double urinating (voiding). This helps if you are not completely emptying your bladder. ? Scheduled voiding.  Making diet changes, such as: ? Avoiding caffeine. ? Drinking fewer fluids, especially alcohol. ? Not drinking in the evening. ? Not having foods or drinks that may irritate the bladder. ? Eating foods that help  prevent or ease constipation. Constipation can make this condition worse.  Having the nerves in your bladder stimulated. There are two options for stimulating the nerves to your bladder: ? Outpatient electrical nerve stimulation. This is done by your health care provider. ? Surgery to implant a bladder pacemaker. The pacemaker helps to control the urge to  urinate.  Follow these instructions at home:  Keep a bladder diary if told to by your health care provider.  Take over-the-counter and prescription medicines only as told by your health care provider.  Do any exercises as told by your health care provider.  Follow a bladder training program as told by your health care provider.  Make any recommended diet changes.  Keep all follow-up visits as told by your health care provider. This is important. Contact a health care provider if:  You start urinating more often.  You feel pain or irritation when you urinate.  You notice blood in your urine.  Your urine looks cloudy.  You develop a fever.  You begin vomiting. Get help right away if:  You are unable to urinate. This information is not intended to replace advice given to you by your health care provider. Make sure you discuss any questions you have with your health care provider. Document Released: 09/28/2009 Document Revised: 01/03/2016 Document Reviewed: 06/28/2015 Elsevier Interactive Patient Education  [employer].  of fluids.   In general please monitor for signs of worsening symptoms and seek immediate medical attention if any concerning.  If symptoms are not resolved in a few days/weeks you should schedule a follow up appointment with your doctor, before if needed.  I hope you get better soon!

## 2017-09-23 NOTE — Progress Notes (Signed)
ACUTE VISIT   HPI:  Chief Complaint  Patient presents with  . Dysuria    at end of urination    Mr.Carlos [name] is a 46 y.o. male, who is here today complaining of 10 days of occasional burning sensation with urination and increased urinary frequency. He denies gross hematuria. History of diabetes, reporting BS's in the low 100s. Urinary frequency, which he attributes to medication started for DM II, Jardiance. Nocturia 2-3 times at night, chronic but mildly worse (4 times in the past few days).  Symptoms are improving. He attributes symptoms to a kidney stone. He is positive he past a kidney stone a few days ago because symptoms greatly improved. He denies risk factors for STDs.  Constipation, which has been  associated with acute nephrolithiasis in the past. Last bowel movement today.  He denies fever,chills,body aches, abdominal pain, nausea,or vomiting. He denies urine dribbling, changes in urine stream,or rectal pain.    Review of Systems  Constitutional: Positive for fatigue. Negative for appetite change and fever.  HENT: Negative for mouth sores, sore throat and trouble swallowing.   Cardiovascular: Negative for palpitations and leg swelling.  Gastrointestinal: Positive for constipation. Negative for abdominal pain, blood in stool, nausea and vomiting.  Endocrine: Negative for polydipsia, polyphagia and polyuria.  Genitourinary: Positive for dysuria and frequency. Negative for decreased urine volume, discharge, flank pain, hematuria, scrotal swelling and urgency.  Musculoskeletal: Negative for back pain and myalgias.  Skin: Negative for pallor and rash.  Neurological: Negative for syncope, weakness, light-headedness and headaches.  Psychiatric/Behavioral: Positive for sleep disturbance. Negative for confusion.      Current Outpatient Prescriptions on File Prior to Visit  Medication Sig Dispense Refill  . aspirin 81 MG tablet Take 81 mg by mouth every  morning.     . empagliflozin (JARDIANCE) 10 MG TABS tablet Take 10 mg by mouth daily. 90 tablet 3  . glucose blood (ONETOUCH VERIO) test strip Test once daily Dx E11.9 100 each 3  . metFORMIN (GLUCOPHAGE) 500 MG tablet Take 2 tablets (1,000 mg total) by mouth 2 (two) times daily with a meal. 360 tablet 3  . Multiple Vitamin (MULTIVITAMIN WITH MINERALS) TABS tablet Take 1 tablet by mouth every morning.     Marland Kitchen ONE TOUCH LANCETS MISC Test once daily.  Onetouch Verio Flex E11.9 100 each 3   No current facility-administered medications on file prior to visit.      Past Medical History:  Diagnosis Date  . ADHD (attention deficit hyperactivity disorder)   . Alcohol abuse   . Depression   . History of exercise stress test    03-18-2013--  normal  . History of kidney stones   . Hyperlipidemia   . Kidney stones   . Left ureteral stone   . Type 2 diabetes mellitus ([facility])    No Known Allergies  Social History   Social History  . Marital status: Married    Spouse name: N/A  . Number of children: N/A  . Years of education: N/A   Social History Main Topics  . Smoking status: Never Smoker  . Smokeless tobacco: Never Used  . Alcohol use Yes     Comment: casual  . Drug use: No  . Sexual activity: Not Asked   Other Topics Concern  . None   Social History Narrative  . None    Vitals:   09/23/17 0732  BP: 90/62  Pulse: 78  Resp: 12  SpO2: 98%  Body mass index is 19.72 kg/m.  Physical Exam  Nursing note and vitals reviewed. Constitutional: He is oriented to person, place, and time. He appears well-developed and well-nourished. No distress.  HENT:  Head: Normocephalic and atraumatic.  Eyes: Conjunctivae are normal.  Cardiovascular: Normal rate and regular rhythm.   Respiratory: Effort normal and breath sounds normal. No respiratory distress.  GI: Soft. He exhibits no mass. There is no hepatomegaly. There is no tenderness. There is no CVA tenderness.  Genitourinary: Rectal  exam shows no external hemorrhoid, no mass and no tenderness. Prostate is enlarged (mild). Prostate is not tender.  Genitourinary Comments: He did not want chaperone in the room during rectal exam.  Musculoskeletal: He exhibits no edema or tenderness.  Neurological: He is alert and oriented to person, place, and time. He has normal strength. Gait normal.  Skin: Skin is warm. No erythema.  Psychiatric: He has a normal mood and affect.  Well groomed, good eye contact.    ASSESSMENT AND PLAN:   [name] was seen today for dysuria.  Diagnoses and all orders for this visit:  Dysuria  Possible etiologies dicussed. Because no urethral discharge and denies risk factors for STD, urethral swab was not done today. Urine dipstick negative except for 3+ glucose (Jardiance) and 1+ blood. We will follow Ucx and treat accordingly, for now he agrees with holding on abx treatment.  -     POCT urinalysis dipstick -     Urine culture  Microscopic hematuria  Most likely related to nephrolithiasis, given the fact that recent symptomatology is similar to those he had with prior episodes. Other possible etiologies discussed.  Urinary frequency  Could be related to DM,medication, infectious process, nephrolithiasis,and mild BPH among others.  Nephrolithiasis  Symptoms improved. Increase fluid intake. Instructed about warning signs. Since symptom improved I do not think imaging is necessary at this time.    -Mr.Carlos [name] was advised to seek immediate medical attention if sudden worsening symptoms or to follow if they persist or if new concerns arise.       Carlos Bielak G. [place], MD  [facility]. [place] office.

## 2017-09-23 NOTE — Progress Notes (Signed)
ll

## 2017-09-26 ENCOUNTER — Other Ambulatory Visit: Payer: Self-pay

## 2017-09-26 ENCOUNTER — Telehealth: Payer: Self-pay

## 2017-09-26 LAB — URINE CULTURE
MICRO NUMBER:: 81123474
SPECIMEN QUALITY:: ADEQUATE

## 2017-09-26 MED ORDER — CIPROFLOXACIN HCL 250 MG PO TABS: 250.0000 mg | ORAL_TABLET | Freq: Two times a day (BID) | ORAL | 0 refills | Status: DC

## 2017-09-26 NOTE — Addendum Note (Signed)
Addended by: Kateri Mc E on: 09/26/2017 01:38 PM   Modules accepted: Orders

## 2017-09-26 NOTE — Telephone Encounter (Signed)
I tried to call patient because the [facility] on [address] does not have any power, and the Rx will not go through. We will need a different pharmacy to send the antibiotic too.

## 2017-09-29 MED ORDER — CIPROFLOXACIN HCL 250 MG PO TABS: 250.0000 mg | ORAL_TABLET | Freq: Two times a day (BID) | ORAL | 0 refills | Status: DC

## 2017-09-29 NOTE — Telephone Encounter (Signed)
Rx resent.

## 2017-12-02 ENCOUNTER — Ambulatory Visit: Payer: Self-pay | Admitting: Family Medicine

## 2018-01-26 ENCOUNTER — Ambulatory Visit: Payer: Self-pay

## 2018-01-26 NOTE — Telephone Encounter (Signed)
Pt. Reports numbness to both hands x 2 weeks - wakes him up at night. Thinks he could have Carpel Tunnel.  Reason for Disposition . [1] Numbness or tingling in one or both hands AND [2] is a chronic symptom (recurrent or ongoing AND present > 4 weeks)  Answer Assessment - Initial Assessment Questions 1. SYMPTOM: "What is the main symptom you are concerned about?" (e.g., weakness, numbness  Ring and middle finger     Left and right hand numbness x 2 weeks  2. ONSET: "When did this start?" (minutes, hours, days; while sleeping)      2 weeks ago  3. LAST NORMAL: "When was the last time you were normal (no symptoms)?"     Over 2 weeks ago 4. PATTERN "Does this come and go, or has it been constant since it started?"  "Is it present now?"     Comes and goes 5. CARDIAC SYMPTOMS: "Have you had any of the following symptoms: chest pain, difficulty breathing, palpitations?"     No 6. NEUROLOGIC SYMPTOMS: "Have you had any of the following symptoms: headache, dizziness, vision loss, double vision, changes in speech, unsteady on your feet?"     No 7. OTHER SYMPTOMS: "Do you have any other symptoms?"     No 8. PREGNANCY: "Is there any chance you are pregnant?" "When was your last menstrual period?"      No  Protocols used: NEUROLOGIC DEFICIT-A-AH

## 2018-01-27 ENCOUNTER — Encounter: Payer: Self-pay | Admitting: Family Medicine

## 2018-01-27 ENCOUNTER — Ambulatory Visit: Payer: Self-pay | Admitting: Family Medicine

## 2018-01-27 VITALS — BP 110/70 | HR 78 | Temp 97.7°F | Wt 159.7 lb

## 2018-01-27 DIAGNOSIS — E11649 Type 2 diabetes mellitus with hypoglycemia without coma: Secondary | ICD-10-CM

## 2018-01-27 DIAGNOSIS — G5602 Carpal tunnel syndrome, left upper limb: Secondary | ICD-10-CM

## 2018-01-27 DIAGNOSIS — E785 Hyperlipidemia, unspecified: Secondary | ICD-10-CM

## 2018-01-27 DIAGNOSIS — M25532 Pain in left wrist: Secondary | ICD-10-CM

## 2018-01-27 LAB — POCT GLYCOSYLATED HEMOGLOBIN (HGB A1C): Hemoglobin A1C: 6.4

## 2018-01-27 MED ORDER — ATORVASTATIN CALCIUM 20 MG PO TABS: 20.0000 mg | ORAL_TABLET | Freq: Every day | ORAL | 3 refills | Status: DC

## 2018-01-27 NOTE — Patient Instructions (Signed)
Carpal Tunnel Syndrome Carpal tunnel syndrome is a condition that causes pain in your hand and arm. The carpal tunnel is a narrow area located on the palm side of your wrist. Repeated wrist motion or certain diseases may cause swelling within the tunnel. This swelling pinches the main nerve in the wrist (median nerve). What are the causes? This condition may be caused by:  Repeated wrist motions.  Wrist injuries.  Arthritis.  A cyst or tumor in the carpal tunnel.  Fluid buildup during pregnancy.  Sometimes the cause of this condition is not known. What increases the risk? This condition is more likely to develop in:  People who have jobs that cause them to repeatedly move their wrists in the same motion, such as Art gallery manager.  Women.  People with certain conditions, such as: ? Diabetes. ? Obesity. ? An underactive thyroid (hypothyroidism). ? Kidney failure.  What are the signs or symptoms? Symptoms of this condition include:  A tingling feeling in your fingers, especially in your thumb, index, and middle fingers.  Tingling or numbness in your hand.  An aching feeling in your entire arm, especially when your wrist and elbow are bent for long periods of time.  Wrist pain that goes up your arm to your shoulder.  Pain that goes down into your palm or fingers.  A weak feeling in your hands. You may have trouble grabbing and holding items.  Your symptoms may feel worse during the night. How is this diagnosed? This condition is diagnosed with a medical history and physical exam. You may also have tests, including:  An electromyogram (EMG). This test measures electrical signals sent by your nerves into the muscles.  X-rays.  How is this treated? Treatment for this condition includes:  Lifestyle changes. It is important to stop doing or modify the activity that caused your condition.  Physical or occupational therapy.  Medicines for pain and inflammation.  This may include medicine that is injected into your wrist.  A wrist splint.  Surgery.  Follow these instructions at home: If you have a splint:  Wear it as told by your health care provider. Remove it only as told by your health care provider.  Loosen the splint if your fingers become numb and tingle, or if they turn cold and blue.  Keep the splint clean and dry. General instructions  Take over-the-counter and prescription medicines only as told by your health care provider.  Rest your wrist from any activity that may be causing your pain. If your condition is work related, talk to your employer about changes that can be made, such as getting a wrist pad to use while typing.  If directed, apply ice to the painful area: ? Put ice in a plastic bag. ? Place a towel between your skin and the bag. ? Leave the ice on for 20 minutes, 2-3 times per day.  Keep all follow-up visits as told by your health care provider. This is important.  Do any exercises as told by your health care provider, physical therapist, or occupational therapist. Contact a health care provider if:  You have new symptoms.  Your pain is not controlled with medicines.  Your symptoms get worse. This information is not intended to replace advice given to you by your health care provider. Make sure you discuss any questions you have with your health care provider. Document Released: 11/29/2000 Document Revised: 04/11/2016 Document Reviewed: 04/19/2015 Elsevier Interactive Patient Education  [employer].

## 2018-01-27 NOTE — Progress Notes (Signed)
Subjective:     Patient ID: Carlos [name], male   DOB: [birth year], 47 y.o.   MRN: [id number]  HPI Patient seen for the following issues  New problem of left hand numbness greater than right especially at night with occasional tingling and mild pain especially involving the middle finger. He is right-hand dominant. No injury. Uses hands a lot with work. Symptoms have been progressive past few weeks. No neck pain. He did some reading and was concerned this may be carpal tunnel syndrome. He has never had this in the past  Type 2 diabetes. Treated with metformin and Jardiance. Most recent A1c 7.1%  Dyslipidemia with LDL cholesterol 130. Currently not on statin. History of high triglycerides and low HDL  Past Medical History:  Diagnosis Date  . ADHD (attention deficit hyperactivity disorder)   . Alcohol abuse   . Depression   . History of exercise stress test    03-18-2013--  normal  . History of kidney stones   . Hyperlipidemia   . Kidney stones   . Left ureteral stone   . Type 2 diabetes mellitus ([place])    Past Surgical History:  Procedure Laterality Date  . CARDIAC CATHETERIZATION  11-27-2006  dr Verlon Setting   non-obstructive CAD/  20% proximal and mid LAD/  perserved LVF,  ef 55-60%  . CYSTOSCOPY W/ RETROGRADES Left 05/17/2015   Procedure: CYSTOSCOPY WITH RETROGRADE PYELOGRAM;  Surgeon: Festus Aloe, MD;  Location: [facility];  Service: Urology;  Laterality: Left;  . CYSTOSCOPY/URETEROSCOPY/HOLMIUM LASER/STENT PLACEMENT Left 05/17/2015   Procedure: LEFT URETEROSCOPY/HOLMIUM LASER/STENT PLACEMENT;  Surgeon: Festus Aloe, MD;  Location: [facility];  Service: Urology;  Laterality: Left;  . EXTRACORPOREAL SHOCK WAVE LITHOTRIPSY Left 05-08-2015  . EXTRACTION RIGHT MANDIBULAR , PREMOLAR/  MAXILLARY MANDIBULAR FIXATION WITH SCREWS  08-03-2008  . ORIF FOUR HOLD PLATE AND MAXILLOMANDIBULAR FIXATION W/ ARCH BARS  08-05-2008   REMOVAL ARCH BARS 09-15-2008  .  TRANSTHORACIC ECHOCARDIOGRAM  04-06-2008  dr Verlon Setting   normal LVF,  ef 55-60%,  trivial MR and TR    reports that  has never smoked. he has never used smokeless tobacco. He reports that he drinks alcohol. He reports that he does not use drugs. family history includes Alcohol abuse in his father, maternal grandfather, and maternal uncle; Diabetes in his maternal grandfather, maternal grandmother, and mother; Heart disease in his mother. No Known Allergies   Review of Systems  Constitutional: Positive for fatigue. Negative for unexpected weight change.  Eyes: Negative for visual disturbance.  Respiratory: Negative for cough, chest tightness and shortness of breath.   Cardiovascular: Negative for chest pain, palpitations and leg swelling.  Endocrine: Negative for polydipsia and polyuria.  Neurological: Positive for numbness. Negative for dizziness, syncope, weakness, light-headedness and headaches.       Objective:   Physical Exam  Constitutional: He is oriented to person, place, and time. He appears well-developed and well-nourished.  HENT:  Right Ear: External ear normal.  Left Ear: External ear normal.  Mouth/Throat: Oropharynx is clear and moist.  Eyes: Pupils are equal, round, and reactive to light.  Neck: Neck supple. No thyromegaly present.  Cardiovascular: Normal rate and regular rhythm.  Pulmonary/Chest: Effort normal and breath sounds normal. No respiratory distress. He has no wheezes. He has no rales.  Musculoskeletal: He exhibits no edema.  Neurological: He is alert and oriented to person, place, and time.  Symmetric reflexes upper extremities Full strength throughout No muscle atrophy -Sensory function intact to touch  throughout       Assessment:     #1 probable carpal tunnel syndrome left greater than right  #2 type 2 diabetes with history of fair control  #3 dyslipidemia    Plan:     -Start wrist splint especially at night for the next few weeks and touch  base if symptoms not improving in 3-4 weeks -Start Lipitor 20 mg once daily and recheck lipids and hepatic in about 8 weeks -Recheck A1c=6.4%  Eulas Post MD [facility] at [facility]

## 2018-03-11 ENCOUNTER — Other Ambulatory Visit: Payer: Self-pay | Admitting: Family Medicine

## 2018-03-11 MED ORDER — EMPAGLIFLOZIN 10 MG PO TABS: 10.0000 mg | ORAL_TABLET | Freq: Every day | ORAL | 3 refills | Status: DC

## 2018-03-11 NOTE — Telephone Encounter (Signed)
Copied from [place]. Topic: Quick Communication - Rx Refill/Question >> Mar 11, 2018 10:33 AM Bea Graff, NT wrote: Medication: empagliflozin (JARDIANCE)  Has the patient contacted their pharmacy? Yes.   (Agent: If no, request that the patient contact the pharmacy for the refill.) Preferred Pharmacy (with phone number or street name): [facility] on [address]. Stated to patient his Vania Rea card that makes the medication free could not be found and he does not want to pay the $30 Agent: Please be advised that RX refills may take up to 3 business days. We ask that you follow-up with your pharmacy.

## 2018-03-11 NOTE — Telephone Encounter (Signed)
Request for Jardiance refill, but cannot find the card to receive a free prescription.  Reported he lost his job this morning, and does not have the card, and is unable to afford the medication.  Is asking about obtaining another card.  Last office visit: 01/27/18 PCP:  Dr. Elease Hashimoto [facility]: [facility] [address]

## 2018-07-21 ENCOUNTER — Telehealth: Payer: Self-pay | Admitting: Family Medicine

## 2018-07-21 NOTE — Telephone Encounter (Signed)
attempted to call patient but unable to leave a message due to mailbox being full.  We do not carry any samples of Jardiance.  Coupons are available if patient would like one.  CRM created

## 2018-07-21 NOTE — Telephone Encounter (Signed)
Copied from [place] [id number]. Topic: Quick Communication - See Telephone Encounter >> Jul 21, 2018 11:11 AM Bea Graff, NT wrote: CRM for notification. See Telephone encounter for: 07/21/18. Pt would like to see if Dr. Elease Hashimoto has any samples of empagliflozin (JARDIANCE) 10 MG TABS tablet? He states he lost his insurance and will not have insurance until November and is unable to afford the medication.

## 2018-07-22 NOTE — Telephone Encounter (Signed)
Attempted to call the patient but no VM  And no answer.  We do not carry any samples of Jardiance.  Coupons are available if patient would like one.

## 2018-07-23 NOTE — Telephone Encounter (Signed)
I spoke with patient and coupon card is ready for pick up here at front office.

## 2018-07-23 NOTE — Telephone Encounter (Signed)
Patient would like to pick up coupons today. Please advise

## 2018-08-31 ENCOUNTER — Encounter: Payer: Self-pay | Admitting: Family Medicine

## 2018-08-31 DIAGNOSIS — Z0289 Encounter for other administrative examinations: Secondary | ICD-10-CM

## 2018-09-15 ENCOUNTER — Other Ambulatory Visit: Payer: Self-pay

## 2018-09-15 ENCOUNTER — Ambulatory Visit (INDEPENDENT_AMBULATORY_CARE_PROVIDER_SITE_OTHER): Payer: 59 | Admitting: Family Medicine

## 2018-09-15 ENCOUNTER — Encounter: Payer: Self-pay | Admitting: Family Medicine

## 2018-09-15 ENCOUNTER — Encounter: Payer: Self-pay | Admitting: Gastroenterology

## 2018-09-15 VITALS — BP 128/70 | HR 87 | Temp 98.0°F | Ht 76.0 in | Wt 156.6 lb

## 2018-09-15 DIAGNOSIS — Z0000 Encounter for general adult medical examination without abnormal findings: Secondary | ICD-10-CM | POA: Diagnosis not present

## 2018-09-15 DIAGNOSIS — Z800 Family history of malignant neoplasm of digestive organs: Secondary | ICD-10-CM

## 2018-09-15 DIAGNOSIS — E11649 Type 2 diabetes mellitus with hypoglycemia without coma: Secondary | ICD-10-CM | POA: Diagnosis not present

## 2018-09-15 LAB — LIPID PANEL
CHOLESTEROL: 179 mg/dL (ref 0–200)
HDL: 47.8 mg/dL (ref >39.00)
LDL CALC: 91 mg/dL (ref 0–99)
NonHDL: 131.15
Total CHOL/HDL Ratio: 4
Triglycerides: 199 mg/dL — ABNORMAL HIGH (ref 0.0–149.0)
VLDL: 39.8 mg/dL (ref 0.0–40.0)

## 2018-09-15 LAB — MICROALBUMIN / CREATININE URINE RATIO
CREATININE, U: 111.1 mg/dL
MICROALB/CREAT RATIO: 0.6 mg/g (ref 0.0–30.0)

## 2018-09-15 LAB — CBC WITH DIFFERENTIAL/PLATELET
BASOS PCT: 0.8 % (ref 0.0–3.0)
Basophils Absolute: 0 10*3/uL (ref 0.0–0.1)
EOS ABS: 0.4 10*3/uL (ref 0.0–0.7)
EOS PCT: 7.8 % — AB (ref 0.0–5.0)
HCT: 39.6 % (ref 39.0–52.0)
Hemoglobin: 13.8 g/dL (ref 13.0–17.0)
LYMPHS ABS: 1.6 10*3/uL (ref 0.7–4.0)
Lymphocytes Relative: 31.8 % (ref 12.0–46.0)
MCHC: 34.9 g/dL (ref 30.0–36.0)
MCV: 94.2 fl (ref 78.0–100.0)
Monocytes Absolute: 0.4 10*3/uL (ref 0.1–1.0)
Monocytes Relative: 7.2 % (ref 3.0–12.0)
NEUTROS ABS: 2.7 10*3/uL (ref 1.4–7.7)
NEUTROS PCT: 52.4 % (ref 43.0–77.0)
PLATELETS: 206 10*3/uL (ref 150.0–400.0)
RBC: 4.2 Mil/uL — ABNORMAL LOW (ref 4.22–5.81)
RDW: 12.8 % (ref 11.5–15.5)
WBC: 5.1 10*3/uL (ref 4.0–10.5)

## 2018-09-15 LAB — HEMOGLOBIN A1C: Hgb A1c MFr Bld: 6.7 % — ABNORMAL HIGH (ref 4.6–6.5)

## 2018-09-15 LAB — BASIC METABOLIC PANEL
BUN: 10 mg/dL (ref 6–23)
CHLORIDE: 99 meq/L (ref 96–112)
CO2: 28 mEq/L (ref 19–32)
Calcium: 9.1 mg/dL (ref 8.4–10.5)
Creatinine, Ser: 0.89 mg/dL (ref 0.40–1.50)
GFR: 97.72 mL/min (ref >60.00)
Glucose, Bld: 272 mg/dL — ABNORMAL HIGH (ref 70–99)
Potassium: 4.2 mEq/L (ref 3.5–5.1)
SODIUM: 137 meq/L (ref 135–145)

## 2018-09-15 LAB — HEPATIC FUNCTION PANEL
ALBUMIN: 3.9 g/dL (ref 3.5–5.2)
ALT: 11 U/L (ref 0–53)
AST: 16 U/L (ref 0–37)
Alkaline Phosphatase: 53 U/L (ref 39–117)
BILIRUBIN TOTAL: 0.7 mg/dL (ref 0.2–1.2)
Bilirubin, Direct: 0.1 mg/dL (ref 0.0–0.3)
TOTAL PROTEIN: 6.5 g/dL (ref 6.0–8.3)

## 2018-09-15 LAB — TSH: TSH: 2.82 u[IU]/mL (ref 0.35–4.50)

## 2018-09-15 MED ORDER — METFORMIN HCL 500 MG PO TABS: 1000.0000 mg | ORAL_TABLET | Freq: Two times a day (BID) | ORAL | 3 refills | Status: DC

## 2018-09-15 MED ORDER — GLUCOSE BLOOD VI STRP: ORAL_STRIP | 3 refills | Status: DC

## 2018-09-15 MED ORDER — EMPAGLIFLOZIN 10 MG PO TABS: 10.0000 mg | ORAL_TABLET | Freq: Every day | ORAL | 3 refills | Status: DC

## 2018-09-15 NOTE — Progress Notes (Signed)
Subjective:     Patient ID: Carlos [name], male   DOB: February 26, 1971, 47 y.o.   MRN: [id number]  HPI Patient seen for physical exam.  He has had no medical insurance coverage until recently.  Poor follow-up because of that.  Last A1c was 6.4%.  He has not had flu vaccine.  No history of Pneumovax and he declines both.  He ran out of his Vania Rea about 4 months ago.  Has remained on metformin.  Not monitoring blood sugars.  He has had some recent blurred vision.  No polyuria or polydipsia.  Appetite stable.  No eye exam in several years.  Family history reviewed.  Mother was just diagnosed this past year with metastatic colon cancer at age 46.  No family history of prostate cancer.  Past Medical History:  Diagnosis Date  . ADHD (attention deficit hyperactivity disorder)   . Alcohol abuse   . Depression   . History of exercise stress test    03-18-2013--  normal  . History of kidney stones   . Hyperlipidemia   . Kidney stones   . Left ureteral stone   . Type 2 diabetes mellitus ([place])    Past Surgical History:  Procedure Laterality Date  . CARDIAC CATHETERIZATION  11-27-2006  dr Verlon Setting   non-obstructive CAD/  20% proximal and mid LAD/  perserved LVF,  ef 55-60%  . CYSTOSCOPY W/ RETROGRADES Left 05/17/2015   Procedure: CYSTOSCOPY WITH RETROGRADE PYELOGRAM;  Surgeon: Festus Aloe, MD;  Location: [facility];  Service: Urology;  Laterality: Left;  . CYSTOSCOPY/URETEROSCOPY/HOLMIUM LASER/STENT PLACEMENT Left 05/17/2015   Procedure: LEFT URETEROSCOPY/HOLMIUM LASER/STENT PLACEMENT;  Surgeon: Festus Aloe, MD;  Location: [facility];  Service: Urology;  Laterality: Left;  . EXTRACORPOREAL SHOCK WAVE LITHOTRIPSY Left 05-08-2015  . EXTRACTION RIGHT MANDIBULAR , PREMOLAR/  MAXILLARY MANDIBULAR FIXATION WITH SCREWS  08-03-2008  . ORIF FOUR HOLD PLATE AND MAXILLOMANDIBULAR FIXATION W/ ARCH BARS  08-05-2008   REMOVAL ARCH BARS 09-15-2008  . TRANSTHORACIC  ECHOCARDIOGRAM  04-06-2008  dr Verlon Setting   normal LVF,  ef 55-60%,  trivial MR and TR    reports that he has never smoked. He has never used smokeless tobacco. He reports that he drinks alcohol. He reports that he does not use drugs. family history includes Alcohol abuse in his father, maternal grandfather, and maternal uncle; Diabetes in his maternal grandfather, maternal grandmother, and mother; Heart disease in his mother. No Known Allergies   Review of Systems  Constitutional: Negative for activity change, appetite change, fatigue, fever and unexpected weight change.  HENT: Negative for congestion, ear pain and trouble swallowing.   Eyes: Positive for visual disturbance. Negative for pain.  Respiratory: Negative for cough, shortness of breath and wheezing.   Cardiovascular: Negative for chest pain and palpitations.  Gastrointestinal: Negative for abdominal distention, abdominal pain, blood in stool, constipation, diarrhea, nausea, rectal pain and vomiting.  Endocrine: Negative for polydipsia and polyuria.  Genitourinary: Negative for dysuria, hematuria and testicular pain.  Musculoskeletal: Negative for arthralgias and joint swelling.  Skin: Negative for rash.  Neurological: Negative for dizziness, syncope and headaches.  Hematological: Negative for adenopathy.  Psychiatric/Behavioral: Negative for confusion and dysphoric mood.       Objective:   Physical Exam  Constitutional: He is oriented to person, place, and time. He appears well-developed and well-nourished. No distress.  HENT:  Head: Normocephalic and atraumatic.  Right Ear: External ear normal.  Left Ear: External ear normal.  Mouth/Throat: Oropharynx is clear  and moist.  Eyes: Pupils are equal, round, and reactive to light. Conjunctivae and EOM are normal.  Neck: Normal range of motion. Neck supple. No thyromegaly present.  Cardiovascular: Normal rate, regular rhythm and normal heart sounds.  No murmur  heard. Pulmonary/Chest: No respiratory distress. He has no wheezes. He has no rales.  Abdominal: Soft. Bowel sounds are normal. He exhibits no distension and no mass. There is no tenderness. There is no rebound and no guarding.  Musculoskeletal: He exhibits no edema.  Lymphadenopathy:    He has no cervical adenopathy.  Neurological: He is alert and oriented to person, place, and time. He displays normal reflexes. No cranial nerve deficit.  Skin: No rash noted.  Psychiatric: He has a normal mood and affect.       Assessment:     Physical exam.  Patient has type 2 diabetes which has been controlled in the past but off medication as above for several months.  Positive family history of colon cancer in mother as above.  Several health maintenance issues addressed as below    Plan:     -Recommended flu vaccine and one-time Pneumovax prior to 66 he declines both -Set up colonoscopy screening with positive family history of colon cancer -He needs to set up eye exam -Set up screening lab work.  Will include urine microalbumin and hemoglobin A1c -Patient states he had a tetanus booster in the past couple years though we have no record  Eulas Post MD [facility] at [facility]

## 2018-09-15 NOTE — Patient Instructions (Signed)
Set up eye exam  We are setting up GI referral for colonoscopy

## 2018-10-05 ENCOUNTER — Ambulatory Visit: Payer: 59 | Admitting: Gastroenterology

## 2018-10-06 ENCOUNTER — Encounter: Payer: Self-pay | Admitting: Gastroenterology

## 2018-10-08 ENCOUNTER — Ambulatory Visit: Payer: 59 | Admitting: Gastroenterology

## 2018-10-13 ENCOUNTER — Ambulatory Visit: Payer: 59 | Admitting: Gastroenterology

## 2018-10-16 ENCOUNTER — Ambulatory Visit: Payer: 59 | Admitting: Gastroenterology

## 2018-10-21 DIAGNOSIS — M25552 Pain in left hip: Secondary | ICD-10-CM | POA: Insufficient documentation

## 2018-12-22 ENCOUNTER — Ambulatory Visit: Payer: 59 | Admitting: Family Medicine

## 2018-12-22 DIAGNOSIS — Z0289 Encounter for other administrative examinations: Secondary | ICD-10-CM

## 2019-01-18 ENCOUNTER — Encounter: Payer: Self-pay | Admitting: Family Medicine

## 2019-01-18 ENCOUNTER — Ambulatory Visit (INDEPENDENT_AMBULATORY_CARE_PROVIDER_SITE_OTHER): Payer: 59 | Admitting: Family Medicine

## 2019-01-18 VITALS — BP 150/100 | HR 87 | Temp 97.5°F | Wt 165.6 lb

## 2019-01-18 DIAGNOSIS — K122 Cellulitis and abscess of mouth: Secondary | ICD-10-CM

## 2019-01-18 DIAGNOSIS — G4700 Insomnia, unspecified: Secondary | ICD-10-CM | POA: Diagnosis not present

## 2019-01-18 MED ORDER — AMOXICILLIN-POT CLAVULANATE 875-125 MG PO TABS: 1.0000 | ORAL_TABLET | Freq: Two times a day (BID) | ORAL | 0 refills | Status: AC

## 2019-01-18 MED ORDER — METHYLPREDNISOLONE ACETATE 80 MG/ML IJ SUSP
80.0000 mg | Freq: Once | INTRAMUSCULAR | Status: AC
  Administered 2019-01-18: 80 mg via INTRAMUSCULAR

## 2019-01-18 MED ORDER — METFORMIN HCL 500 MG PO TABS: 1000.0000 mg | ORAL_TABLET | Freq: Two times a day (BID) | ORAL | 3 refills | Status: DC

## 2019-01-18 MED ORDER — CEFTRIAXONE SODIUM 1 G IJ SOLR
1.0000 g | Freq: Once | INTRAMUSCULAR | Status: AC
  Administered 2019-01-18: 1 g via INTRAMUSCULAR

## 2019-01-18 MED ORDER — MAGIC MOUTHWASH W/LIDOCAINE: 5.0000 mL | Freq: Four times a day (QID) | ORAL | 0 refills | Status: DC

## 2019-01-18 NOTE — Patient Instructions (Signed)
Benadryl 25-24m at bedtime along with melatonin 611m Follow up with Dr. BuElease Hashimotof this is not working.

## 2019-01-18 NOTE — Progress Notes (Signed)
Carlos [name] DOB: November 03, 1971 Encounter date: 01/18/2019  This is a 48 y.o. male who presents with Chief Complaint  Patient presents with  . Sore Throat    x 4 days, sore throat, jaw/teeth aches, chills, hard to swallow, no fever,     History of present illness:  Started Friday with right ear feeling clogged up, then roof of mouth got raw. Then started with aching in bottom jaw. Now down in throat.   No known sick contacts.   No fever, no chills.    States that anxiety has gotten worse. Lost 2 jobs in last year. Divorce. Now not liking crowds. Comes and goes. Sometimes affects sleep. Tried lexapro and zoloft and paxil in past. Sleep issues has been long term. Never feels he can fall or stay asleep. Hasn't been trying anything OTC.   Allergies  Allergen Reactions  . Ambien [Zolpidem Tartrate]     Crashed car day after, also hallucinating   Current Meds  Medication Sig  . aspirin 81 MG tablet Take 81 mg by mouth every morning.   Marland Kitchen glucose blood (ONETOUCH VERIO) test strip Test once daily Dx E11.9  . metFORMIN (GLUCOPHAGE) 500 MG tablet Take 2 tablets (1,000 mg total) by mouth 2 (two) times daily with a meal.  . Multiple Vitamin (MULTIVITAMIN WITH MINERALS) TABS tablet Take 1 tablet by mouth every morning.   Marland Kitchen ONE TOUCH LANCETS MISC Test once daily.  Onetouch Verio Flex E11.9  . [DISCONTINUED] metFORMIN (GLUCOPHAGE) 500 MG tablet Take 2 tablets (1,000 mg total) by mouth 2 (two) times daily with a meal.  . [DISCONTINUED] metFORMIN (GLUCOPHAGE) 500 MG tablet Take 2 tablets (1,000 mg total) by mouth 2 (two) times daily with a meal.    Review of Systems  Constitutional: Negative for chills and fever.  HENT: Positive for congestion, postnasal drip, sore throat, trouble swallowing and voice change. Negative for ear pain, sinus pressure and sinus pain.   Respiratory: Negative for cough, shortness of breath and wheezing.   Cardiovascular: Negative for chest pain.    Objective:  BP  (!) 150/100 (BP Location: Left Arm, Patient Position: Sitting, Cuff Size: Normal)   Pulse 87   Temp (!) 97.5 F (36.4 C) (Oral)   Wt 165 lb 9.6 oz (75.1 kg)   SpO2 98%   BMI 20.16 kg/m   Weight: 165 lb 9.6 oz (75.1 kg)   BP Readings from Last 3 Encounters:  01/18/19 (!) 150/100  09/15/18 128/70  01/27/18 110/70   Wt Readings from Last 3 Encounters:  01/18/19 165 lb 9.6 oz (75.1 kg)  09/15/18 156 lb 9.6 oz (71 kg)  01/27/18 159 lb 11.2 oz (72.4 kg)    Physical Exam Constitutional:      General: He is not in acute distress.    Appearance: He is well-developed.  HENT:     Right Ear: Tympanic membrane, ear canal and external ear normal.     Left Ear: Tympanic membrane, ear canal and external ear normal.     Mouth/Throat:     Lips: Pink.     Mouth: Mucous membranes are moist.     Tongue: No lesions.     Pharynx: Uvula midline. Posterior oropharyngeal erythema and uvula swelling present.     Tonsils: No tonsillar exudate or tonsillar abscesses.      Comments: There is edema, erythema of soft palate and edema/erythema of uvula.  Cardiovascular:     Rate and Rhythm: Normal rate and regular rhythm.  Heart sounds: Normal heart sounds. No murmur. No friction rub.  Pulmonary:     Effort: Pulmonary effort is normal. No respiratory distress.     Breath sounds: Normal breath sounds. No wheezing or rales.  Musculoskeletal:     Right lower leg: No edema.     Left lower leg: No edema.  Neurological:     Mental Status: He is alert and oriented to person, place, and time.  Psychiatric:        Behavior: Behavior normal.        Thought Content: Thought content does not include suicidal ideation. Thought content does not include suicidal plan.     Assessment/Plan 1. Uvulitis Dukes was called in to pharmacy. OK to wait until tomorrow to start augmentin (sent). Fluids/soft foods recommended, no acid/salty fluids as this may further irritate throat. Warned if any worsening of sx needs  to be seen (ER if night time).  - methylPREDNISolone acetate (DEPO-MEDROL) injection 80 mg - cefTRIAXone (ROCEPHIN) injection 1 g  2. Insomnia: discussed briefly. He was interested in getting xanax which he has had from friends. I discussed risks of this medication with him and suggested trial of otc benadryl/melatonin first. Let us know if this is/isn't working. On further discussion he states anxiety isn't bad enough to treat; more insomnia issue. States that alcohol is "not a problem" anymore. If OTC sleep aids not working would recommend visit to re-discuss.   No follow-ups on file.    Micheline Rough, MD

## 2020-01-11 ENCOUNTER — Ambulatory Visit: Payer: Self-pay | Attending: Internal Medicine

## 2020-01-11 DIAGNOSIS — Z20822 Contact with and (suspected) exposure to covid-19: Secondary | ICD-10-CM | POA: Insufficient documentation

## 2020-01-12 LAB — NOVEL CORONAVIRUS, NAA: SARS-CoV-2, NAA: NOT DETECTED

## 2020-02-15 ENCOUNTER — Other Ambulatory Visit: Payer: Self-pay

## 2020-02-16 ENCOUNTER — Encounter: Payer: Self-pay | Admitting: Family Medicine

## 2020-02-16 ENCOUNTER — Ambulatory Visit (INDEPENDENT_AMBULATORY_CARE_PROVIDER_SITE_OTHER): Payer: Self-pay | Admitting: Family Medicine

## 2020-02-16 VITALS — BP 118/78 | HR 92 | Temp 97.8°F | Wt 153.6 lb

## 2020-02-16 DIAGNOSIS — R030 Elevated blood-pressure reading, without diagnosis of hypertension: Secondary | ICD-10-CM

## 2020-02-16 NOTE — Progress Notes (Signed)
Subjective:     Patient ID: Carlos [name], male   DOB: 05/24/1971, 49 y.o.   MRN: [id number]  HPI   Clayvon has not been seen in over a year.  He lost insurance but plans to get this back in about a month and plans to set up physical then.  He is basically not taking any of his medications which include Lipitor and Metformin.  He is here today with blood pressure elevation concern.  He was at his sister's house this weekend and on Saturday had new cuff out and he had initial reading 159/115.  They got another cuff and got reading of 180/110.  He has not any headaches.  No chest pains.  No peripheral edema.  Has had no alcohol since November.  Does frequently take over-the-counter nonsteroidal such as Aleve and Advil.  No recent dietary changes.  Never treated for hypertension previously.  Past Medical History:  Diagnosis Date  . ADHD (attention deficit hyperactivity disorder)   . Alcohol abuse   . Depression   . History of exercise stress test    03-18-2013--  normal  . History of kidney stones   . Hyperlipidemia   . Kidney stones   . Left ureteral stone   . Type 2 diabetes mellitus ([place])    Past Surgical History:  Procedure Laterality Date  . CARDIAC CATHETERIZATION  11-27-2006  dr Verlon Setting   non-obstructive CAD/  20% proximal and mid LAD/  perserved LVF,  ef 55-60%  . CYSTOSCOPY W/ RETROGRADES Left 05/17/2015   Procedure: CYSTOSCOPY WITH RETROGRADE PYELOGRAM;  Surgeon: Festus Aloe, MD;  [facility];  Service: Urology;  Laterality: Left;  . CYSTOSCOPY/URETEROSCOPY/HOLMIUM LASER/STENT PLACEMENT Left 05/17/2015   Procedure: LEFT URETEROSCOPY/HOLMIUM LASER/STENT PLACEMENT;  Surgeon: Festus Aloe, MD;  Location: [facility];  Service: Urology;  Laterality: Left;  . EXTRACORPOREAL SHOCK WAVE LITHOTRIPSY Left 05-08-2015  . EXTRACTION RIGHT MANDIBULAR , PREMOLAR/  MAXILLARY MANDIBULAR FIXATION WITH SCREWS  08-03-2008  . ORIF FOUR HOLD PLATE AND  MAXILLOMANDIBULAR FIXATION W/ ARCH BARS  08-05-2008   REMOVAL ARCH BARS 09-15-2008  . TRANSTHORACIC ECHOCARDIOGRAM  04-06-2008  dr Verlon Setting   normal LVF,  ef 55-60%,  trivial MR and TR    reports that he has never smoked. He has never used smokeless tobacco. He reports current alcohol use. He reports that he does not use drugs. family history includes Alcohol abuse in his father, maternal grandfather, and maternal uncle; Diabetes in his maternal grandfather, maternal grandmother, and mother; Heart disease in his mother. Allergies  Allergen Reactions  . Ambien [Zolpidem Tartrate]     Crashed car day after, also hallucinating     Review of Systems  Constitutional: Negative for fatigue.  Eyes: Negative for visual disturbance.  Respiratory: Negative for cough, chest tightness and shortness of breath.   Cardiovascular: Negative for chest pain, palpitations and leg swelling.  Neurological: Negative for dizziness, syncope, weakness, light-headedness and headaches.       Objective:   Physical Exam Constitutional:      Appearance: He is well-developed.  Eyes:     Pupils: Pupils are equal, round, and reactive to light.  Neck:     Thyroid: No thyromegaly.  Cardiovascular:     Rate and Rhythm: Normal rate and regular rhythm.  Pulmonary:     Effort: Pulmonary effort is normal. No respiratory distress.     Breath sounds: Normal breath sounds. No wheezing or rales.  Neurological:     Mental  Status: He is alert and oriented to person, place, and time.        Assessment:     Recent elevated blood pressure outside of office but well controlled today.  Nurse reading 120/78 and repeat by me left arm seated 118/78    Plan:     -Reassurance at this time -We did mention trying to avoid regular use of nonsteroidals which could exacerbate somewhat -Avoid alcohol -Watch sodium intake -Schedule physical for approximately 1 month.  Needs follow-up screening labs then including A1c, lipids,  etc.  Eulas Post MD [facility] at [facility]

## 2020-02-16 NOTE — Patient Instructions (Signed)

## 2020-03-13 ENCOUNTER — Emergency Department
Admission: EM | Admit: 2020-03-13 | Discharge: 2020-03-13 | Discharge status: Against Medical Advice | Payer: Self-pay | Attending: Emergency Medicine | Admitting: Emergency Medicine

## 2020-03-13 ENCOUNTER — Other Ambulatory Visit: Payer: Self-pay

## 2020-03-13 ENCOUNTER — Emergency Department: Payer: Self-pay

## 2020-03-13 DIAGNOSIS — G459 Transient cerebral ischemic attack, unspecified: Secondary | ICD-10-CM | POA: Insufficient documentation

## 2020-03-13 DIAGNOSIS — Z7984 Long term (current) use of oral hypoglycemic drugs: Secondary | ICD-10-CM | POA: Insufficient documentation

## 2020-03-13 DIAGNOSIS — Z79899 Other long term (current) drug therapy: Secondary | ICD-10-CM | POA: Insufficient documentation

## 2020-03-13 DIAGNOSIS — Z5329 Procedure and treatment not carried out because of patient's decision for other reasons: Secondary | ICD-10-CM | POA: Insufficient documentation

## 2020-03-13 DIAGNOSIS — E119 Type 2 diabetes mellitus without complications: Secondary | ICD-10-CM | POA: Insufficient documentation

## 2020-03-13 DIAGNOSIS — R202 Paresthesia of skin: Secondary | ICD-10-CM

## 2020-03-13 DIAGNOSIS — Z7982 Long term (current) use of aspirin: Secondary | ICD-10-CM | POA: Insufficient documentation

## 2020-03-13 LAB — COMPREHENSIVE METABOLIC PANEL
ALT: 21 U/L (ref 0–44)
AST: 25 U/L (ref 15–41)
Albumin: 4 g/dL (ref 3.5–5.0)
Alkaline Phosphatase: 57 U/L (ref 38–126)
Anion gap: 14 (ref 5–15)
BUN: 17 mg/dL (ref 6–20)
CO2: 23 mmol/L (ref 22–32)
Calcium: 8.5 mg/dL — ABNORMAL LOW (ref 8.9–10.3)
Chloride: 94 mmol/L — ABNORMAL LOW (ref 98–111)
Creatinine, Ser: 0.8 mg/dL (ref 0.61–1.24)
GFR calc Af Amer: >60 mL/min (ref >60)
GFR calc non Af Amer: >60 mL/min (ref >60)
Glucose, Bld: 312 mg/dL — ABNORMAL HIGH (ref 70–99)
Potassium: 3.6 mmol/L (ref 3.5–5.1)
Sodium: 131 mmol/L — ABNORMAL LOW (ref 135–145)
Total Bilirubin: 0.7 mg/dL (ref 0.3–1.2)
Total Protein: 7.2 g/dL (ref 6.5–8.1)

## 2020-03-13 LAB — CBC
HCT: 39.5 % (ref 39.0–52.0)
Hemoglobin: 14 g/dL (ref 13.0–17.0)
MCH: 33.3 pg (ref 26.0–34.0)
MCHC: 35.4 g/dL (ref 30.0–36.0)
MCV: 94 fL (ref 80.0–100.0)
Platelets: 203 10*3/uL (ref 150–400)
RBC: 4.2 MIL/uL — ABNORMAL LOW (ref 4.22–5.81)
RDW: 11.5 % (ref 11.5–15.5)
WBC: 6.4 10*3/uL (ref 4.0–10.5)
nRBC: 0 % (ref 0.0–0.2)

## 2020-03-13 LAB — APTT: aPTT: 26 seconds (ref 24–36)

## 2020-03-13 LAB — GLUCOSE, CAPILLARY: Glucose-Capillary: 288 mg/dL — ABNORMAL HIGH (ref 70–99)

## 2020-03-13 LAB — DIFFERENTIAL
Abs Immature Granulocytes: 0.04 10*3/uL (ref 0.00–0.07)
Basophils Absolute: 0 10*3/uL (ref 0.0–0.1)
Basophils Relative: 1 %
Eosinophils Absolute: 0.1 10*3/uL (ref 0.0–0.5)
Eosinophils Relative: 1 %
Immature Granulocytes: 1 %
Lymphocytes Relative: 27 %
Lymphs Abs: 1.7 10*3/uL (ref 0.7–4.0)
Monocytes Absolute: 0.4 10*3/uL (ref 0.1–1.0)
Monocytes Relative: 7 %
Neutro Abs: 4.1 10*3/uL (ref 1.7–7.7)
Neutrophils Relative %: 63 %

## 2020-03-13 LAB — PROTIME-INR
INR: 0.9 (ref 0.8–1.2)
Prothrombin Time: 12.3 seconds (ref 11.4–15.2)

## 2020-03-13 IMAGING — CT CT HEAD CODE STROKE
3 series · 15 of 47 positions shown, 18 images · non-contrast
Comparison: None.

CLINICAL DATA: Code stroke. Left arm numbness and right eye blurry
vision.

EXAM:
CT HEAD WITHOUT CONTRAST
TECHNIQUE: Contiguous axial images were obtained from the base of the skull
through the vertex without intravenous contrast.

[Series 2: head wo · axial · 0.47mm/px · z∈[-161,-31]mm · 9 of 32 slices shown, 12 images]
[im 3/32  brain]
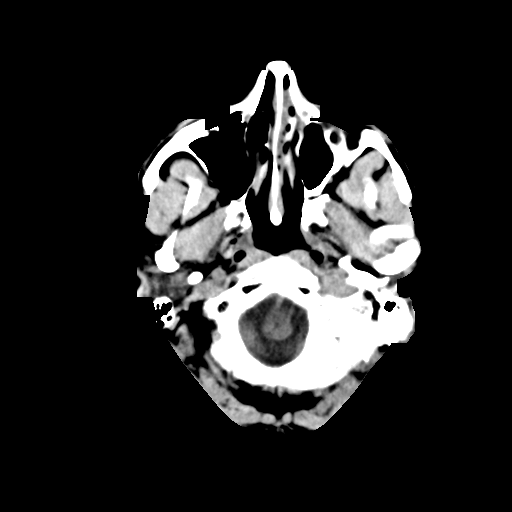
[im 3/32  bone]
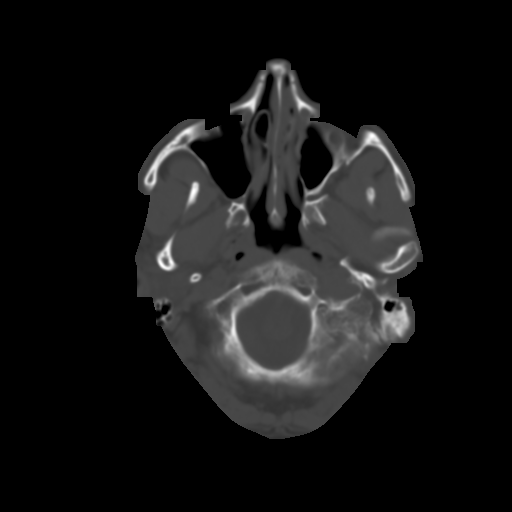
[im 6/32  brain]
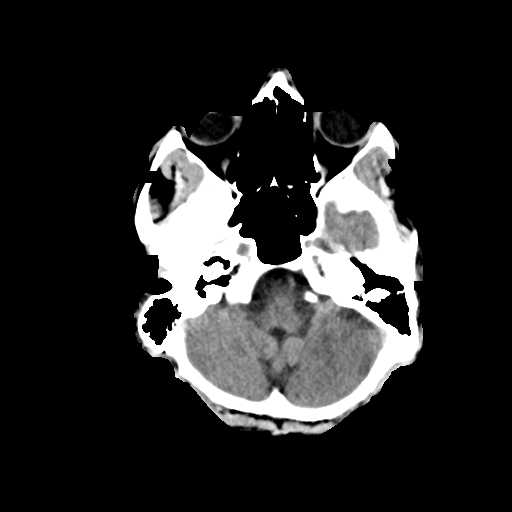
[im 9/32  brain]
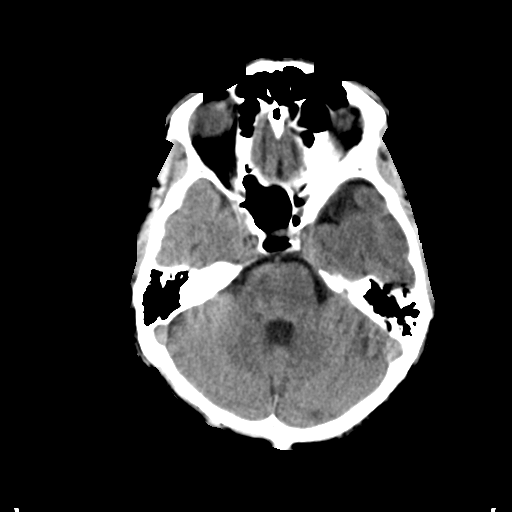
[im 12/32  brain]
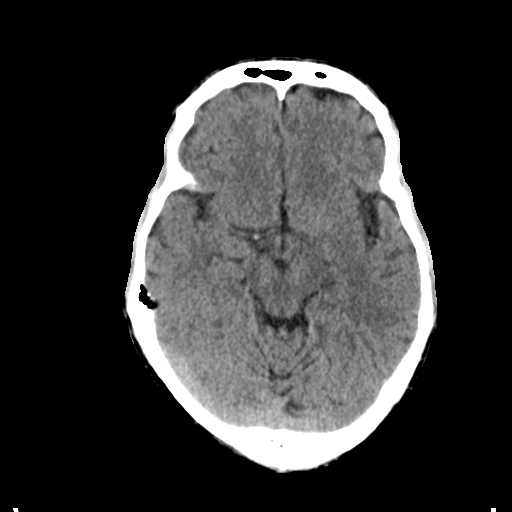
[im 17/32  brain]
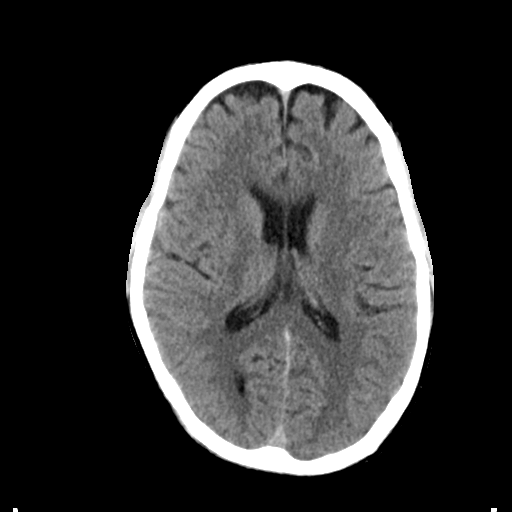
[im 17/32  bone]
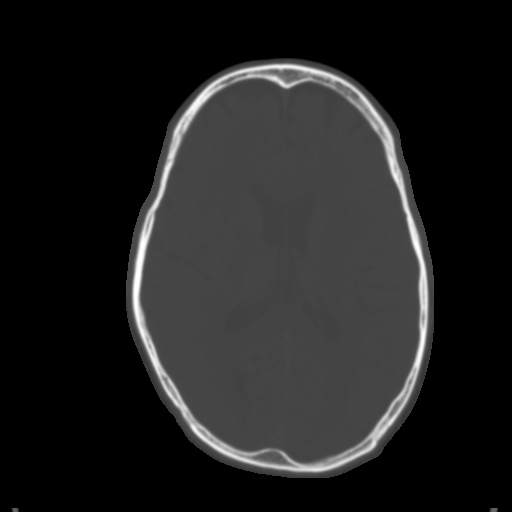
[im 20/32  brain]
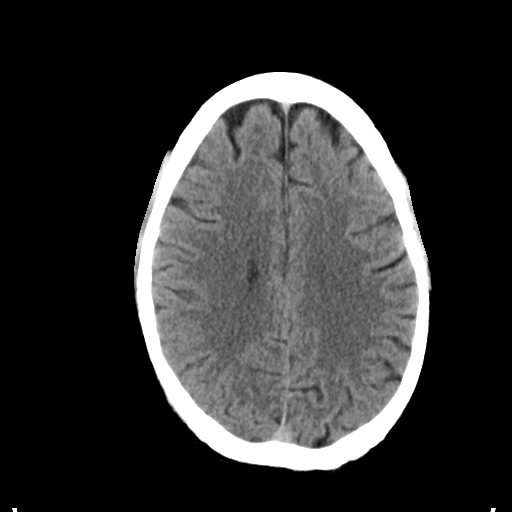
[im 23/32  brain]
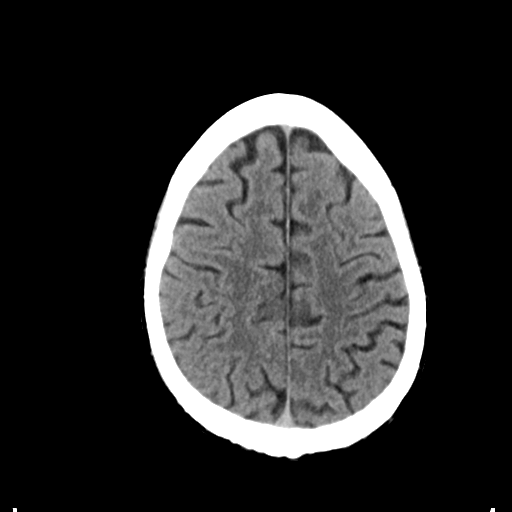
[im 26/32  brain]
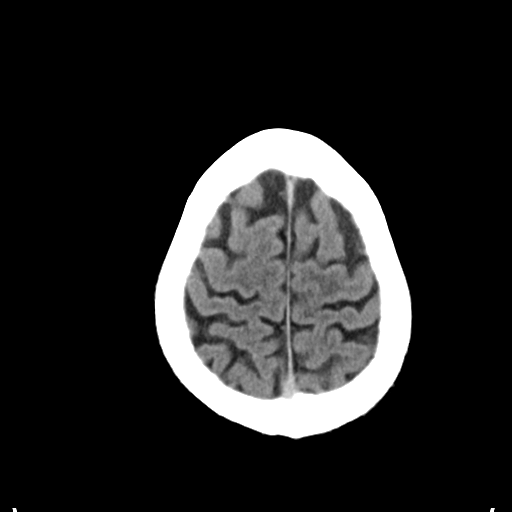
[im 29/32  brain]
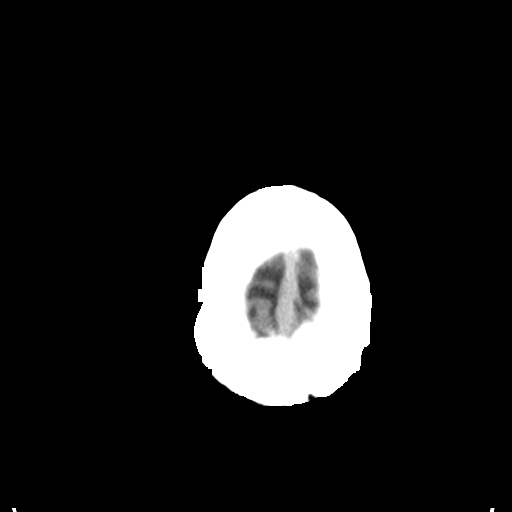
[im 29/32  bone]
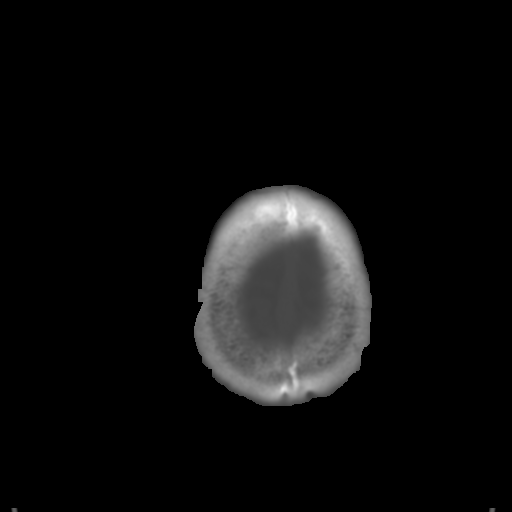

[Series 4: coronal soft tissue · coronal · 0.30mm/px · 3 of 70 slices shown]
[im 24/70  brain]
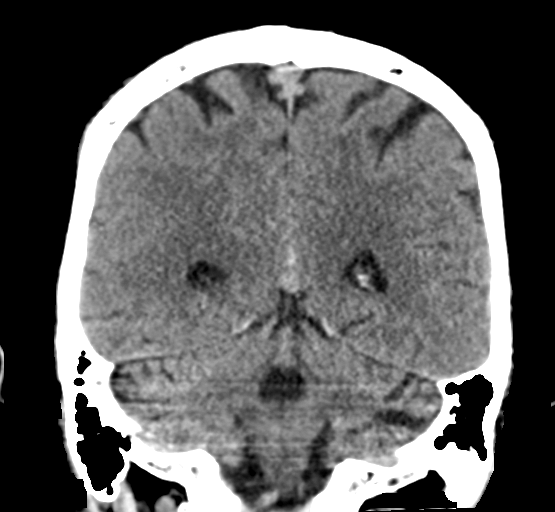
[im 31/70  brain]
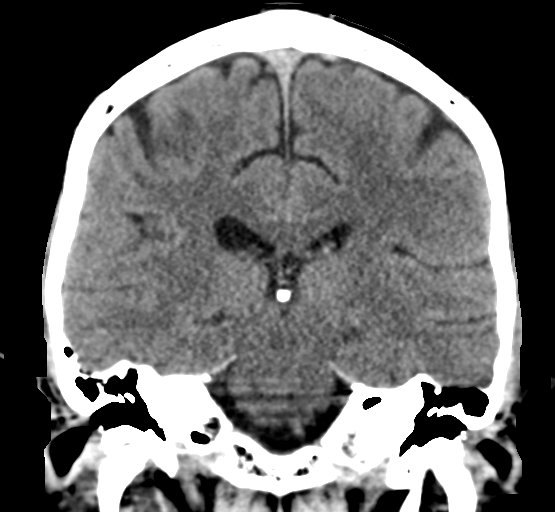
[im 39/70  brain]
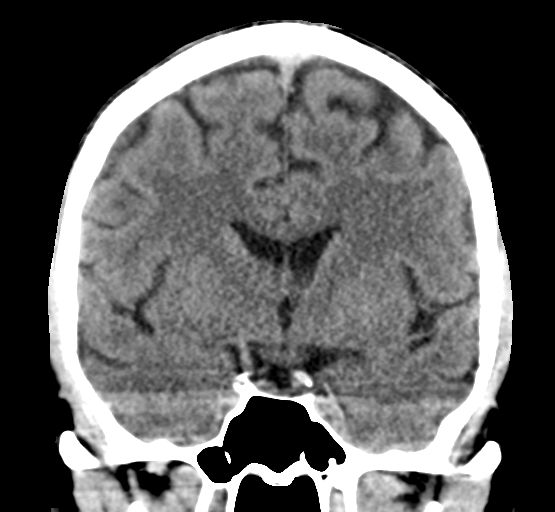

[Series 5: sagittal soft tissue · sagittal · 0.30mm/px · 3 of 57 slices shown]
[im 19/57  brain]
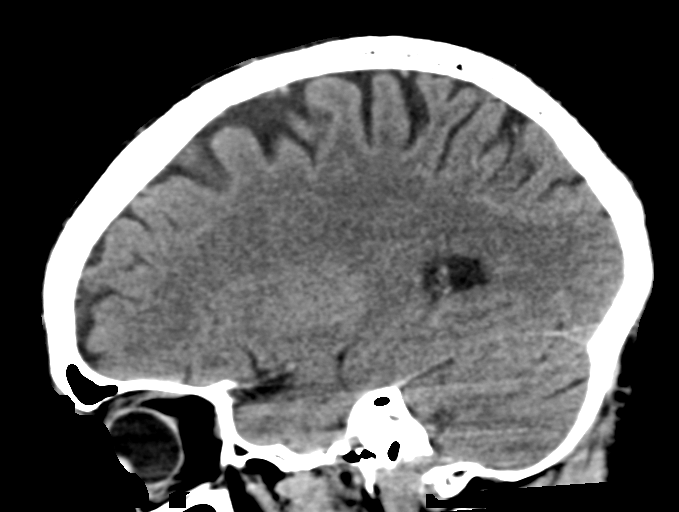
[im 29/57  brain]
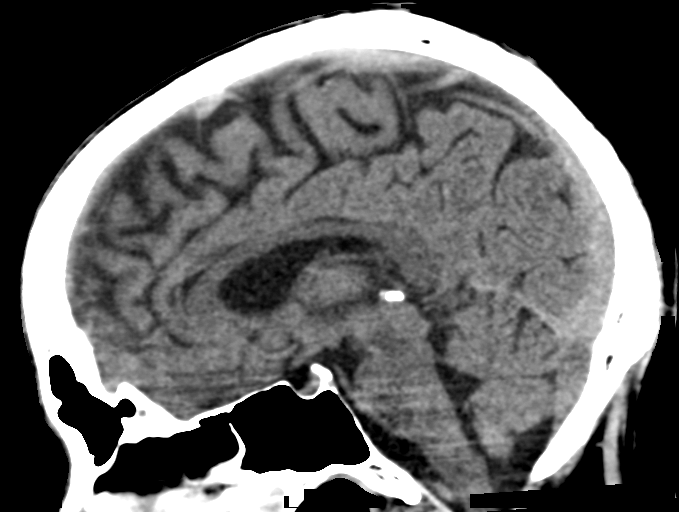
[im 38/57  brain]
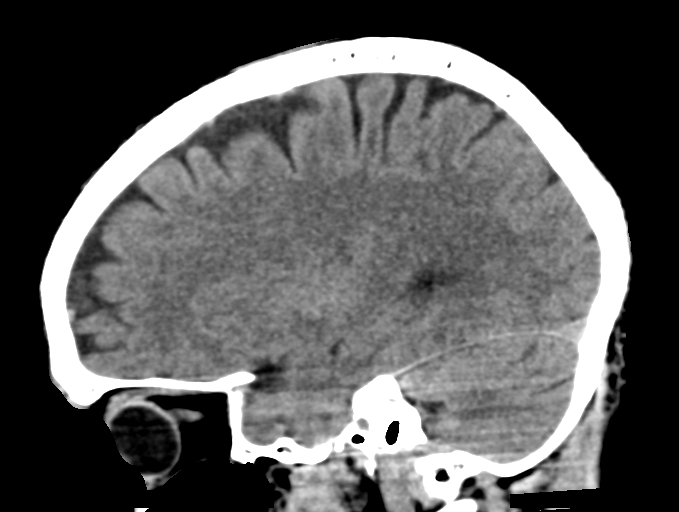

[15 of 47 positions shown; findings below may reference images not displayed]

FINDINGS: Brain: No evidence of acute infarction, hemorrhage, hydrocephalus,
extra-axial collection or mass lesion/mass effect.

Vascular: No hyperdense vessel or unexpected calcification.

Skull: Normal. Negative for fracture or focal lesion.

Sinuses/Orbits: No acute finding.

Other: These results were called by telephone at the time of
interpretation on [DATE] at [DATE] to provider MEMON
, who verbally acknowledged these results.

ASPECTS (Alberta Stroke Program Early CT Score)

- Ganglionic level infarction (caudate, lentiform nuclei, internal
capsule, insula, M1-M3 cortex): 7

- Supraganglionic infarction (M4-M6 cortex): 3

Total score (0-10 with 10 being normal): 10
IMPRESSION: 1. Negative head CT.
2. ASPECTS is 10.

## 2020-03-13 MED ORDER — SODIUM CHLORIDE 0.9% FLUSH: 3.0000 mL | Freq: Once | INTRAVENOUS | Status: DC

## 2020-03-13 NOTE — ED Notes (Signed)
Pt advised not to leave and be admitted; insisted he wanted to go home. Pt spoke with Dr. Kerman Passey and reiterated that he is going to leave. Pt signing out AMA. Pt appears to be stable and NAD noted.

## 2020-03-13 NOTE — ED Notes (Signed)
Code  Stroke  Called  To [phone]

## 2020-03-13 NOTE — ED Provider Notes (Addendum)
[facility] Emergency Department Provider Note  Time seen: 7:53 AM  I have reviewed the triage vital signs and the nursing notes.   HISTORY  Chief Complaint Numbness   HPI Carlos [name] is a 49 y.o. male with a past medical history of alcohol use, hyperlipidemia, diabetes, presents to the emergency department for confusion and left arm numbness.  According to the patient he states he did not sleep well last night was having "crazy dreams."  Patient states this morning he felt somewhat confused, on the way to work this morning he was feeling numbness and tingling in his left hand and left forearm.  Patient states he went to work and his boss was concerned due to the left arm numbness so he sent him to the hospital.  Patient states the numbness is largely resolved continues have some mild tingling however in the left hand.  Denies any trouble speaking.  Denies any headache.  Denies any weakness in any arm or leg.  No history of stroke.  States drank 2 beers last night.  Denies any substance use.   Past Medical History:  Diagnosis Date  . ADHD (attention deficit hyperactivity disorder)   . Alcohol abuse   . Depression   . History of exercise stress test    03-18-2013--  normal  . History of kidney stones   . Hyperlipidemia   . Kidney stones   . Left ureteral stone   . Type 2 diabetes mellitus [facility])     Patient Active Problem List   Diagnosis Date Noted  . Family history of colon cancer 09/15/2018  . Type 2 diabetes mellitus, uncontrolled ([place]) 05/12/2014  . Varicose veins 05/12/2014  . ADD (attention deficit disorder) 04/07/2013  . Alcohol abuse 12/23/2012  . History of depression 12/23/2012  . Dyslipidemia 12/23/2012    Past Surgical History:  Procedure Laterality Date  . CARDIAC CATHETERIZATION  11-27-2006  dr Verlon Setting   non-obstructive CAD/  20% proximal and mid LAD/  perserved LVF,  ef 55-60%  . CYSTOSCOPY W/ RETROGRADES Left 05/17/2015   Procedure:  CYSTOSCOPY WITH RETROGRADE PYELOGRAM;  Surgeon: Festus Aloe, MD;  Location: [facility];  Service: Urology;  Laterality: Left;  . CYSTOSCOPY/URETEROSCOPY/HOLMIUM LASER/STENT PLACEMENT Left 05/17/2015   Procedure: LEFT URETEROSCOPY/HOLMIUM LASER/STENT PLACEMENT;  Surgeon: Festus Aloe, MD;  Location: [facility];  Service: Urology;  Laterality: Left;  . EXTRACORPOREAL SHOCK WAVE LITHOTRIPSY Left 05-08-2015  . EXTRACTION RIGHT MANDIBULAR , PREMOLAR/  MAXILLARY MANDIBULAR FIXATION WITH SCREWS  08-03-2008  . ORIF FOUR HOLD PLATE AND MAXILLOMANDIBULAR FIXATION W/ ARCH BARS  08-05-2008   REMOVAL ARCH BARS 09-15-2008  . TRANSTHORACIC ECHOCARDIOGRAM  04-06-2008  dr Verlon Setting   normal LVF,  ef 55-60%,  trivial MR and TR    Prior to Admission medications   Medication Sig Start Date End Date Taking? Authorizing Provider  aspirin 81 MG tablet Take 81 mg by mouth every morning.     [provider]  atorvastatin (LIPITOR) 20 MG tablet Take 1 tablet (20 mg total) by mouth daily. Patient not taking: Reported on 02/16/2020 01/27/18   Eulas Post, MD  glucose blood [facility] VERIO) test strip Test once daily Dx E11.9 09/15/18   Eulas Post, MD  magic mouthwash w/lidocaine SOLN Take 5 mLs by mouth 4 (four) times daily. 01/18/19   Caren Macadam, MD  metFORMIN (GLUCOPHAGE) 500 MG tablet Take 2 tablets (1,000 mg total) by mouth 2 (two) times daily with a  meal. 01/18/19   Caren Macadam, MD  Multiple Vitamin (MULTIVITAMIN WITH MINERALS) TABS tablet Take 1 tablet by mouth every morning.     [provider]  ONE TOUCH LANCETS MISC Test once daily.  Glory Rosebush Verio Flex L79.8 12/19/15   Burchette, Alinda Sierras, MD    Allergies  Allergen Reactions  . Ambien [Zolpidem Tartrate]     Crashed car day after, also hallucinating    Family History  Problem Relation Age of Onset  . Diabetes Mother        type 1  . Heart disease Mother        pacemaker  .  Alcohol abuse Father   . Alcohol abuse Maternal Uncle   . Diabetes Maternal Grandmother   . Alcohol abuse Maternal Grandfather   . Diabetes Maternal Grandfather     Social History Social History   Tobacco Use  . Smoking status: Never Smoker  . Smokeless tobacco: Never Used  Substance Use Topics  . Alcohol use: Yes    Comment: casual  . Drug use: No    Review of Systems Constitutional: Negative for fever. Cardiovascular: Negative for chest pain. Respiratory: Negative for shortness of breath. Gastrointestinal: Negative for abdominal pain Musculoskeletal: Negative for musculoskeletal complaints Neurological: Negative for headache.  Left arm tingling All other ROS negative  ____________________________________________   PHYSICAL EXAM:  VITAL SIGNS: ED Triage Vitals  Enc Vitals Group     BP 03/13/20 0729 (!) 141/100     Pulse Rate 03/13/20 0729 85     Resp 03/13/20 0729 17     Temp 03/13/20 0731 97.8 F (36.6 C)     Temp Source 03/13/20 0729 Oral     SpO2 03/13/20 0729 99 %     Weight 03/13/20 0729 155 lb (70.3 kg)     Height 03/13/20 0729 6' 4"  (1.93 m)     Head Circumference --      Peak Flow --      Pain Score 03/13/20 0729 0     Pain Loc --      Pain Edu? --      Excl. in [place]? --    Constitutional: Alert and oriented. Well appearing and in no distress. Eyes: Normal exam, PERRL ENT      Head: Normocephalic and atraumatic.      Mouth/Throat: Mucous membranes are moist. Cardiovascular: Normal rate, regular rhythm.  Respiratory: Normal respiratory effort without tachypnea nor retractions. Breath sounds are clear  Gastrointestinal: Soft and nontender. No distention. Musculoskeletal: Nontender with normal range of motion in all extremities.  Neurologic:  Normal speech and language. No gross focal neurologic deficits.  Equal grip strength bilaterally.  5/5 motor in all extremities.  No sensory deficits.  No pronator drift. Skin:  Skin is warm, dry and intact.   Psychiatric: Mood and affect are normal.   ____________________________________________    EKG  EKG viewed and interpreted by myself shows a normal sinus rhythm 82 bpm with a narrow QRS, normal axis, normal intervals, nonspecific ST changes.  No ST elevation.  ____________________________________________    RADIOLOGY  CT scan of the head is negative.  ____________________________________________   INITIAL IMPRESSION / ASSESSMENT AND PLAN / ED COURSE  Pertinent labs & imaging results that were available during my care of the patient were reviewed by me and considered in my medical decision making (see chart for details).   Patient presents to the emergency department with some confusion this morning and feeling tingling/numbness in the left  hand/arm.  Overall the patient appears well, no acute distress.  It is not entirely clear if the patient was normal upon awakening or had symptoms upon awakening.  Last known definitive normal was last night.  We will have neurology see as a code stroke.  CT scan is negative.  No concerning findings on exam at this time.  Lab work is reassuring.  CT scan of the head is negative.  Neurology has recommended admission for MRI and EEG.  Patient strongly does not wish to be admitted to the hospital.  States he does not have insurance until April 4.  I had a discussion with the patient regarding admission and the risk he would be taking by going home as this could possibly be a stroke TIA or seizure.  Patient understands but still wishes to go home.  I offered to perform the MRI from the emergency department, patient states he does not wish to incur any further charges and wishes to go home.  He understands the risk in leaving and appears to have capacity to make his own medical decisions.  We will have the patient sign out [place].  Patient states he will follow up with a primary care doctor.  I discussed very strict return precautions with the  patient.  NIH Stroke Scale   Interval: Baseline Time: 8:44 AM Person Administering Scale: Harvest Dark  Administer stroke scale items in the order listed. Record performance in each category after each subscale exam. Do not go back and change scores. Follow directions provided for each exam technique. Scores should reflect what the patient does, not what the clinician thinks the patient can do. The clinician should record answers while administering the exam and work quickly. Except where indicated, the patient should not be coached (i.e., repeated requests to patient to make a special effort).   1a  Level of consciousness: 0=alert; keenly responsive  1b. LOC questions:  0=Performs both tasks correctly  1c. LOC commands: 0=Performs both tasks correctly  2.  Best Gaze: 0=normal  3.  Visual: 0=No visual loss  4. Facial Palsy: 0=Normal symmetric movement  5a.  Motor left arm: 0=No drift, limb holds 90 (or 45) degrees for full 10 seconds  5b.  Motor right arm: 0=No drift, limb holds 90 (or 45) degrees for full 10 seconds  6a. motor left leg: 0=No drift, limb holds 90 (or 45) degrees for full 10 seconds  6b  Motor right leg:  0=No drift, limb holds 90 (or 45) degrees for full 10 seconds  7. Limb Ataxia: 0=Absent  8.  Sensory: 0=Normal; no sensory loss  9. Best Language:  0=No aphasia, normal  10. Dysarthria: 0=Normal  11. Extinction and Inattention: 0=No abnormality  12. Distal motor function: 0=Normal   Total:   0    Salomon Ganser Brucato was evaluated in Emergency Department on 03/13/2020 for the symptoms described in the history of present illness. He was evaluated in the context of the global COVID-19 pandemic, which necessitated consideration that the patient might be at risk for infection with the SARS-CoV-2 virus that causes COVID-19. Institutional protocols and algorithms that pertain to the evaluation of patients at risk for COVID-19 are in a state of rapid change based on information  released by regulatory bodies including the CDC and federal and state organizations. These policies and algorithms were followed during the patient's care in the ED.  ____________________________________________   FINAL CLINICAL IMPRESSION(S) / ED DIAGNOSES  Paresthesias    Florina Glas,  Lennette Bihari, MD 03/13/20 [id number]    Harvest Dark, MD 03/13/20 [phone]

## 2020-03-13 NOTE — Consult Note (Signed)
[facility] Tele[facility]urology Consult Services   Date of Service:   03/13/2020 07:51:35  Impression:     .  G45.9 - Transient cerebral ischemic attack, unspecified  Comments/Sign-Out: Patient with history of Diabetes Mellitus Presents with complaint of memory impairment, blurry vision, left hand numbness/tingling (all resolved) Head CT: No acute Intracranial hemorrhage. NIHSS 0 Etiology for Symptoms/presentation is concerning for TIA vs a seizure phenomenon. No acute disabling neurological deficits on exam therefore not a candidate for IV Alteplase. Symptoms not suggestive of Large Vessel Occlusion. Will be admitted for stroke/seizure workup.  Metrics: Last Known Well: 03/12/2020 21:30:00 [facility] Notification Time: 03/13/2020 07:51:35 Arrival Time: 03/13/2020 07:20:00 Stamp Time: 03/13/2020 07:51:35 Time First Login Attempt: 03/13/2020 08:00:48 Symptoms: memory impairment, left arm numbness NIHSS Start Assessment Time: 03/13/2020 08:05:47 Patient is not a candidate for Alteplase/Activase. Alteplase Medical Decision: 03/13/2020 08:04:53 Patient was not deemed candidate for Alteplase/Activase thrombolytics because of Last Well Known Above 4.5 Hours.  CT head was reviewed.  Clinical Presentation is not Suggestive of Large Vessel Occlusive Disease  ED Physician notified of diagnostic impression and management plan on 03/13/2020 08:15:19  Our recommendations are outlined below.  Recommendations:     .  Activate Stroke Protocol Admission/Order Set     .  Stroke/Telemetry Floor     .  Neuro Checks     .  Bedside Swallow Eval     .  DVT Prophylaxis     .  IV Fluids, Normal Saline     .  Head of Bed 30 Degrees     .  Euglycemia and Avoid Hyperthermia (PRN Acetaminophen)     .  Antiplatelet Therapy Recommended     .  brain MRI with and without contras     .  consider routine EEG  Routine Consultation with [facility] for Follow up Care  Sign Out:   .  Discussed with Emergency Department Provider    ------------------------------------------------------------------------------  History of Present Illness: Patient is a 49 year old Male.  Patient was brought by private transportation with symptoms of memory impairment, left arm numbness  [facility] TeleStroke Consult Note Patient with history of Diabetes Mellitus last known well per patient and Presenting symptoms/Chief complaint/reason for consult: went to bed in usual state of health 03/12/2020 21:30 woke up with a jolt at 02:30 and since then has been having memory impairment. And left arm and blurry vision since 07:00 this morning. Anticoagulation or Antiplatelet use: aspirin Chart reviewed for Pertinent medical, surgical, family history. Chart reviewed for Medications list and allergies. Pertinent positive and negative review of systems noted in History of Present Illness.  Last seen normal was beyond 4.5 hours of presentation.  Past Medical History:     . Diabetes Mellitus   Antiplatelet use: aspirin    Examination: BP(141/100), Pulse(85), Blood Glucose(288) 1A: Level of Consciousness - Alert; keenly responsive + 0 1B: Ask Month and Age - Both Questions Right + 0 1C: Blink Eyes & Squeeze Hands - Performs Both Tasks + 0 2: Test Horizontal Extraocular Movements - Normal + 0 3: Test Visual Fields - No Visual Loss + 0 4: Test Facial Palsy (Use Grimace if Obtunded) - Normal symmetry + 0 5A: Test Left Arm Motor Drift - No Drift for 10 Seconds + 0 5B: Test Right Arm Motor Drift - No Drift for 10 Seconds + 0 6A: Test Left Leg Motor Drift - No Drift for 5 Seconds + 0 6B: Test Right Leg Motor Drift - No Drift for  5 Seconds + 0 7: Test Limb Ataxia (FNF/Heel-Shin) - No Ataxia + 0 8: Test Sensation - Normal; No sensory loss + 0 9: Test Language/Aphasia - Normal; No aphasia + 0 10: Test Dysarthria - Normal + 0 11: Test Extinction/Inattention - No abnormality + 0  NIHSS Score:  0  Pre-Morbid Modified Ranking Scale: 0 Points = No symptoms at all   Patient/Family was informed the Neurology Consult would occur via TeleHealth consult by way of interactive audio and video telecommunications and consented to receiving care in this manner.   Due to the immediate potential for life-threatening deterioration due to underlying acute neurologic illness, I spent 20 minutes providing critical care. This time includes time for face to face visit via telemedicine, review of medical records, imaging studies and discussion of findings with providers, the patient and/or family.   Dr Elenor Quinones   [facility] [phone]  Case [id number]

## 2020-03-13 NOTE — ED Notes (Signed)
See triage note; numbness in left arm since 7am;  Pt confused when he awoke; states he has had blurred vision in right eye as well.  Pt states he has some "tingling" left in the left arm.

## 2020-03-13 NOTE — Progress Notes (Signed)
Chaplain responded to code stroke. Patient is being assessed will follow up

## 2020-03-13 NOTE — ED Triage Notes (Signed)
Pt c/o having some trouble with his memory from yesterday, like the fact that he grilled out with his brother, states he had diaphoresis over the night and this morning starting around 7am had numbness to the left arm and blurred vision in the right eye with some feeling some confusion.

## 2020-04-13 ENCOUNTER — Other Ambulatory Visit: Payer: Self-pay

## 2020-04-14 ENCOUNTER — Encounter: Payer: Self-pay | Admitting: Family Medicine

## 2020-08-24 ENCOUNTER — Telehealth: Payer: Self-pay | Admitting: Family Medicine

## 2020-08-24 ENCOUNTER — Telehealth (INDEPENDENT_AMBULATORY_CARE_PROVIDER_SITE_OTHER): Payer: HRSA Program | Admitting: Family Medicine

## 2020-08-24 ENCOUNTER — Encounter: Payer: Self-pay | Admitting: Family Medicine

## 2020-08-24 VITALS — Wt 150.0 lb

## 2020-08-24 DIAGNOSIS — B342 Coronavirus infection, unspecified: Secondary | ICD-10-CM

## 2020-08-24 DIAGNOSIS — E119 Type 2 diabetes mellitus without complications: Secondary | ICD-10-CM

## 2020-08-24 DIAGNOSIS — K1370 Unspecified lesions of oral mucosa: Secondary | ICD-10-CM | POA: Diagnosis not present

## 2020-08-24 DIAGNOSIS — U071 COVID-19: Secondary | ICD-10-CM | POA: Diagnosis not present

## 2020-08-24 DIAGNOSIS — R636 Underweight: Secondary | ICD-10-CM

## 2020-08-24 MED ORDER — BENZONATATE 100 MG PO CAPS: 100.0000 mg | ORAL_CAPSULE | Freq: Three times a day (TID) | ORAL | 0 refills | Status: DC | PRN

## 2020-08-24 NOTE — Progress Notes (Signed)
Virtual Visit via Video Note  I connected with Carlos [name]  on 08/24/20 at  6:20 PM EDT by a video enabled telemedicine application and verified that I am speaking with the correct person using two identifiers.  Location patient: home, [place] Location provider:work or home office Persons participating in the virtual visit: patient, provider  I discussed the limitations of evaluation and management by telemedicine and the availability of in person appointments. The patient expressed understanding and agreed to proceed.   HPI:  Acute visit for COVID19: -started 3 days ago -symptoms include cough, feeling tired, headache, jaw aching and mouth lesions - reports red painful lesions around gums, then turned brown and he feels like some teeth are loose -positive covid19 test 2 days ago -multiple members of [employer] team have [place] and fiance has covid19 -denies SOB, fever, vomiting diarrhea, inability to tol oral intake, malaise, skin lesions  -can eat and drink -wants information about mab -not vaccinated for covid, reports is anti-vax but reports now is wishing he had gotten the vaccine -has diabetes -is very worried about "dying tissue" around teeth but he called several urgent care and dental offices and they will not see him due to covid and self pay  ROS: See pertinent positives and negatives per HPI.  Past Medical History:  Diagnosis Date  . ADHD (attention deficit hyperactivity disorder)   . Alcohol abuse   . Depression   . History of exercise stress test    03-18-2013--  normal  . History of kidney stones   . Hyperlipidemia   . Kidney stones   . Left ureteral stone   . Type 2 diabetes mellitus ([place])     Past Surgical History:  Procedure Laterality Date  . CARDIAC CATHETERIZATION  11-27-2006  dr Verlon Setting   non-obstructive CAD/  20% proximal and mid LAD/  perserved LVF,  ef 55-60%  . CYSTOSCOPY W/ RETROGRADES Left 05/17/2015   Procedure: CYSTOSCOPY WITH RETROGRADE PYELOGRAM;   Surgeon: Festus Aloe, MD;  Location[facility];  Service: Urology;  Laterality: Left;  . CYSTOSCOPY/URETEROSCOPY/HOLMIUM LASER/STENT PLACEMENT Left 05/17/2015   Procedure: LEFT URETEROSCOPY/HOLMIUM LASER/STENT PLACEMENT;  Surgeon: Festus Aloe, MD;  Location: [facility];  Service: Urology;  Laterality: Left;  . EXTRACORPOREAL SHOCK WAVE LITHOTRIPSY Left 05-08-2015  . EXTRACTION RIGHT MANDIBULAR , PREMOLAR/  MAXILLARY MANDIBULAR FIXATION WITH SCREWS  08-03-2008  . ORIF FOUR HOLD PLATE AND MAXILLOMANDIBULAR FIXATION W/ ARCH BARS  08-05-2008   REMOVAL ARCH BARS 09-15-2008  . TRANSTHORACIC ECHOCARDIOGRAM  04-06-2008  dr Verlon Setting   normal LVF,  ef 55-60%,  trivial MR and TR    Family History  Problem Relation Age of Onset  . Diabetes Mother        type 1  . Heart disease Mother        pacemaker  . Alcohol abuse Father   . Alcohol abuse Maternal Uncle   . Diabetes Maternal Grandmother   . Alcohol abuse Maternal Grandfather   . Diabetes Maternal Grandfather     SOCIAL HX: see hpi   Current Outpatient Medications:  .  aspirin 81 MG tablet, Take 81 mg by mouth every morning. , Disp: , Rfl:  .  atorvastatin (LIPITOR) 20 MG tablet, Take 1 tablet (20 mg total) by mouth daily., Disp: 90 tablet, Rfl: 3 .  glucose blood (ONETOUCH VERIO) test strip, Test once daily Dx E11.9, Disp: 100 each, Rfl: 3 .  metFORMIN (GLUCOPHAGE) 500 MG tablet, Take 2 tablets (1,000 mg  total) by mouth 2 (two) times daily with a meal., Disp: 360 tablet, Rfl: 3 .  Multiple Vitamin (MULTIVITAMIN WITH MINERALS) TABS tablet, Take 1 tablet by mouth every morning. , Disp: , Rfl:  .  ONE TOUCH LANCETS MISC, Test once daily.  Onetouch Verio Flex E11.9, Disp: 100 each, Rfl: 3 .  benzonatate (TESSALON PERLES) 100 MG capsule, Take 1 capsule (100 mg total) by mouth 3 (three) times daily as needed., Disp: 20 capsule, Rfl: 0  EXAM:  VITALS per patient if applicable:  GENERAL: alert, oriented,  appears well and in no acute distress  HEENT: vido quality very poor, pt has poor Internet connection - video disconnected half way through and we reconnected via phone - was not able to see oral lesions  NECK: normal movements of the head and neck  LUNGS: on inspection no signs of respiratory distress, breathing rate appears normal, no obvious gross SOB, gasping or wheezing  CV: no obvious cyanosis  MS: moves all visible extremities without noticeable abnormality  PSYCH/NEURO: pleasant and cooperative, no obvious depression or anxiety, speech and thought processing grossly intact  ASSESSMENT AND PLAN:  Discussed the following assessment and plan:  Coronavirus infection  Unspecified lesions of oral mucosa  Diabetes mellitus without complication (HCC)  Underweight  -we discussed possible serious and likely etiologies, options for evaluation and workup, limitations of telemedicine visit vs in person visit, treatment, treatment risks and precautions. Pt prefers to treat via telemedicine empirically rather then risking or undertaking an in person visit at this moment. However, given the strange oral/dental issues he is describing and his concerns advised inperson evaluation as was not able to assess. Did quick online search and apparently can be severe complications involving the gums and jaw rarely in [place], particularly in [place] patients. Offered to reach out to PCP office, however, he reports was told they can't see him due to covid. Advised [facility] given his self pay situation and covid19, he is in agreement. Sent cough med tessalon and discussed mab treatment. Infusion center information provided. Offered virtual follow up w/ PCP but he reports will call if needed. Advised home isolation except to seek care, precuations, pot complications.  Work/School slipped offered:declined  I discussed the assessment and treatment plan with the patient. The patient was provided an  opportunity to ask questions and all were answered. The patient agreed with the plan and demonstrated an understanding of the instructions.      Lucretia Kern, DO

## 2020-08-24 NOTE — Telephone Encounter (Signed)
error 

## 2020-08-24 NOTE — Patient Instructions (Signed)
-  [facility] of the gum and jaw issues  -other than to seek medical care, please stay home while sick, and for a full 10 days since the onset of symptoms PLUS one day of no fever and feeling better  -I sent the medication(s) we discussed to your pharmacy: Meds ordered this encounter  Medications  . benzonatate (TESSALON PERLES) 100 MG capsule    Sig: Take 1 capsule (100 mg total) by mouth 3 (three) times daily as needed.    Dispense:  20 capsule    Refill:  0    -call number provided for outpatient COVID19 treatment center [phone]  -can use tylenol or aleve if needed for fevers, aches and pains per instructions  -can use nasal saline a few times per day if nasal congestion, sometime a short course of Afrin nasal spray for 3 days can help as well  -stay hydrated, drink plenty of fluids and eat small healthy meals - avoid dairy  -can take 1000 IU Vit D3 and Vit C lozenges per instructions  -check out the CDC website for more information on home care, transmission and treatment for COVID19  -follow up with your doctor in 2-3 days unless improving and feeling better  I hope you are feeling better soon! Seek in-person care or a follow up telemedicine visit promptly if your symptoms worsen, new concerns arise or you are not improving as expected. Call 911 if severe symptoms.

## 2020-08-25 ENCOUNTER — Other Ambulatory Visit: Payer: Self-pay | Admitting: Internal Medicine

## 2020-08-25 ENCOUNTER — Ambulatory Visit (HOSPITAL_COMMUNITY)
Admission: RE | Admit: 2020-08-25 | Discharge: 2020-08-25 | Disposition: A | Discharge status: Home / Self Care | Payer: HRSA Program | Source: Ambulatory Visit | Attending: Pulmonary Disease | Admitting: Pulmonary Disease

## 2020-08-25 DIAGNOSIS — U071 COVID-19: Secondary | ICD-10-CM

## 2020-08-25 DIAGNOSIS — E11649 Type 2 diabetes mellitus with hypoglycemia without coma: Secondary | ICD-10-CM

## 2020-08-25 MED ORDER — FAMOTIDINE IN NACL 20-0.9 MG/50ML-% IV SOLN: 20.0000 mg | Freq: Once | INTRAVENOUS | Status: DC | PRN

## 2020-08-25 MED ORDER — SODIUM CHLORIDE 0.9 % IV SOLN
1200.0000 mg | Freq: Once | INTRAVENOUS | Status: AC
  Administered 2020-08-25: 1200 mg via INTRAVENOUS

## 2020-08-25 MED ORDER — METHYLPREDNISOLONE SODIUM SUCC 125 MG IJ SOLR: 125.0000 mg | Freq: Once | INTRAMUSCULAR | Status: DC | PRN

## 2020-08-25 MED ORDER — SODIUM CHLORIDE 0.9 % IV SOLN: INTRAVENOUS | Status: DC | PRN

## 2020-08-25 MED ORDER — DIPHENHYDRAMINE HCL 50 MG/ML IJ SOLN: 50.0000 mg | Freq: Once | INTRAMUSCULAR | Status: DC | PRN

## 2020-08-25 MED ORDER — ALBUTEROL SULFATE HFA 108 (90 BASE) MCG/ACT IN AERS: 2.0000 | INHALATION_SPRAY | Freq: Once | RESPIRATORY_TRACT | Status: DC | PRN

## 2020-08-25 MED ORDER — EPINEPHRINE 0.3 MG/0.3ML IJ SOAJ: 0.3000 mg | Freq: Once | INTRAMUSCULAR | Status: DC | PRN

## 2020-08-25 NOTE — Progress Notes (Signed)
.   Diagnosis: COVID-19  Physician:dr wright   Procedure: [facility] Med: casirivimab\imdevimab infusion - Provided patient with casirivimab\imdevimab fact sheet for patients, parents and caregivers prior to infusion.  Complications: No immediate complications noted.  Discharge: Discharged home   [place] 08/25/2020

## 2020-08-25 NOTE — Discharge Instructions (Signed)
COVID-19 COVID-19 is a respiratory infection that is caused by a virus called severe acute respiratory syndrome coronavirus 2 (SARS-CoV-2). The disease is also known as coronavirus disease or novel coronavirus. In some people, the virus may not cause any symptoms. In others, it may cause a serious infection. The infection can get worse quickly and can lead to complications, such as:  Pneumonia, or infection of the lungs.  Acute respiratory distress syndrome or ARDS. This is a condition in which fluid build-up in the lungs prevents the lungs from filling with air and passing oxygen into the blood.  Acute respiratory failure. This is a condition in which there is not enough oxygen passing from the lungs to the body or when carbon dioxide is not passing from the lungs out of the body.  Sepsis or septic shock. This is a serious bodily reaction to an infection.  Blood clotting problems.  Secondary infections due to bacteria or fungus.  Organ failure. This is when your body's organs stop working. The virus that causes COVID-19 is contagious. This means that it can spread from person to person through droplets from coughs and sneezes (respiratory secretions). What are the causes? This illness is caused by a virus. You may catch the virus by:  Breathing in droplets from an infected person. Droplets can be spread by a person breathing, speaking, singing, coughing, or sneezing.  Touching something, like a table or a doorknob, that was exposed to the virus (contaminated) and then touching your mouth, nose, or eyes. What increases the risk? Risk for infection You are more likely to be infected with this virus if you:  Are within 6 feet (2 meters) of a person with COVID-19.  Provide care for or live with a person who is infected with COVID-19.  Spend time in crowded indoor spaces or live in shared housing. Risk for serious illness You are more likely to become seriously ill from the virus if  you:  Are 50 years of age or older. The higher your age, the more you are at risk for serious illness.  Live in a nursing home or long-term care facility.  Have cancer.  Have a long-term (chronic) disease such as: ? Chronic lung disease, including chronic obstructive pulmonary disease or asthma. ? A long-term disease that lowers your body's ability to fight infection (immunocompromised). ? Heart disease, including heart failure, a condition in which the arteries that lead to the heart become narrow or blocked (coronary artery disease), a disease which makes the heart muscle thick, weak, or stiff (cardiomyopathy). ? Diabetes. ? Chronic kidney disease. ? Sickle cell disease, a condition in which red blood cells have an abnormal "sickle" shape. ? Liver disease.  Are obese. What are the signs or symptoms? Symptoms of this condition can range from mild to severe. Symptoms may appear any time from 2 to 14 days after being exposed to the virus. They include:  A fever or chills.  A cough.  Difficulty breathing.  Headaches, body aches, or muscle aches.  Runny or stuffy (congested) nose.  A sore throat.  New loss of taste or smell. Some people may also have stomach problems, such as nausea, vomiting, or diarrhea. Other people may not have any symptoms of COVID-19. How is this diagnosed? This condition may be diagnosed based on:  Your signs and symptoms, especially if: ? You live in an area with a COVID-19 outbreak. ? You recently traveled to or from an area where the virus is common. ? You   provide care for or live with a person who was diagnosed with COVID-19. ? You were exposed to a person who was diagnosed with COVID-19.  A physical exam.  Lab tests, which may include: ? Taking a sample of fluid from the back of your nose and throat (nasopharyngeal fluid), your nose, or your throat using a swab. ? A sample of mucus from your lungs (sputum). ? Blood tests.  Imaging tests,  which may include, X-rays, CT scan, or ultrasound. How is this treated? At present, there is no medicine to treat COVID-19. Medicines that treat other diseases are being used on a trial basis to see if they are effective against COVID-19. Your health care provider will talk with you about ways to treat your symptoms. For most people, the infection is mild and can be managed at home with rest, fluids, and over-the-counter medicines. Treatment for a serious infection usually takes places in a hospital intensive care unit (ICU). It may include one or more of the following treatments. These treatments are given until your symptoms improve.  Receiving fluids and medicines through an IV.  Supplemental oxygen. Extra oxygen is given through a tube in the nose, a face mask, or a hood.  Positioning you to lie on your stomach (prone position). This makes it easier for oxygen to get into the lungs.  Continuous positive airway pressure (CPAP) or bi-level positive airway pressure (BPAP) machine. This treatment uses mild air pressure to keep the airways open. A tube that is connected to a motor delivers oxygen to the body.  Ventilator. This treatment moves air into and out of the lungs by using a tube that is placed in your windpipe.  Tracheostomy. This is a procedure to create a hole in the neck so that a breathing tube can be inserted.  Extracorporeal membrane oxygenation (ECMO). This procedure gives the lungs a chance to recover by taking over the functions of the heart and lungs. It supplies oxygen to the body and removes carbon dioxide. Follow these instructions at home: Lifestyle  If you are sick, stay home except to get medical care. Your health care provider will tell you how long to stay home. Call your health care provider before you go for medical care.  Rest at home as told by your health care provider.  Do not use any products that contain nicotine or tobacco, such as cigarettes,  e-cigarettes, and chewing tobacco. If you need help quitting, ask your health care provider.  Return to your normal activities as told by your health care provider. Ask your health care provider what activities are safe for you. General instructions  Take over-the-counter and prescription medicines only as told by your health care provider.  Drink enough fluid to keep your urine pale yellow.  Keep all follow-up visits as told by your health care provider. This is important. How is this prevented?  There is no vaccine to help prevent COVID-19 infection. However, there are steps you can take to protect yourself and others from this virus. To protect yourself:   Do not travel to areas where COVID-19 is a risk. The areas where COVID-19 is reported change often. To identify high-risk areas and travel restrictions, check the CDC travel website: wwwnc.cdc.gov/travel/notices  If you live in, or must travel to, an area where COVID-19 is a risk, take precautions to avoid infection. ? Stay away from people who are sick. ? Wash your hands often with soap and water for 20 seconds. If soap and water   are not available, use an alcohol-based hand sanitizer. ? Avoid touching your mouth, face, eyes, or nose. ? Avoid going out in public, follow guidance from your state and local health authorities. ? If you must go out in public, wear a cloth face covering or face mask. Make sure your mask covers your nose and mouth. ? Avoid crowded indoor spaces. Stay at least 6 feet (2 meters) away from others. ? Disinfect objects and surfaces that are frequently touched every day. This may include:  Counters and tables.  Doorknobs and light switches.  Sinks and faucets.  Electronics, such as phones, remote controls, keyboards, computers, and tablets. To protect others: If you have symptoms of COVID-19, take steps to prevent the virus from spreading to others.  If you think you have a COVID-19 infection, contact  your health care provider right away. Tell your health care team that you think you may have a COVID-19 infection.  Stay home. Leave your house only to seek medical care. Do not use public transport.  Do not travel while you are sick.  Wash your hands often with soap and water for 20 seconds. If soap and water are not available, use alcohol-based hand sanitizer.  Stay away from other members of your household. Let healthy household members care for children and pets, if possible. If you have to care for children or pets, wash your hands often and wear a mask. If possible, stay in your own room, separate from others. Use a different bathroom.  Make sure that all people in your household wash their hands well and often.  Cough or sneeze into a tissue or your sleeve or elbow. Do not cough or sneeze into your hand or into the air.  Wear a cloth face covering or face mask. Make sure your mask covers your nose and mouth. Where to find more information  Centers for Disease Control and Prevention: www.cdc.gov/coronavirus/2019-ncov/index.html  World Health Organization: www.who.int/health-topics/coronavirus Contact a health care provider if:  You live in or have traveled to an area where COVID-19 is a risk and you have symptoms of the infection.  You have had contact with someone who has COVID-19 and you have symptoms of the infection. Get help right away if:  You have trouble breathing.  You have pain or pressure in your chest.  You have confusion.  You have bluish lips and fingernails.  You have difficulty waking from sleep.  You have symptoms that get worse. These symptoms may represent a serious problem that is an emergency. Do not wait to see if the symptoms will go away. Get medical help right away. Call your local emergency services (911 in the [place] Do not drive yourself to the hospital. Let the emergency medical personnel know if you think you have  COVID-19. Summary  COVID-19 is a respiratory infection that is caused by a virus. It is also known as coronavirus disease or novel coronavirus. It can cause serious infections, such as pneumonia, acute respiratory distress syndrome, acute respiratory failure, or sepsis.  The virus that causes COVID-19 is contagious. This means that it can spread from person to person through droplets from breathing, speaking, singing, coughing, or sneezing.  You are more likely to develop a serious illness if you are 50 years of age or older, have a weak immune system, live in a nursing home, or have chronic disease.  There is no medicine to treat COVID-19. Your health care provider will talk with you about ways to treat your symptoms.    Take steps to protect yourself and others from infection. Wash your hands often and disinfect objects and surfaces that are frequently touched every day. Stay away from people who are sick and wear a mask if you are sick. This information is not intended to replace advice given to you by your health care provider. Make sure you discuss any questions you have with your health care provider. Document Revised: 10/01/2019 Document Reviewed: 01/07/2019 Elsevier Patient Education  2020 [employer]. What types of side effects do monoclonal antibody drugs cause?  Common side effects  In general, the more common side effects caused by monoclonal antibody drugs include: . Allergic reactions, such as hives or itching . Flu-like signs and symptoms, including chills, fatigue, fever, and muscle aches and pains . Nausea, vomiting . Diarrhea . Skin rashes . Low blood pressure   The CDC is recommending patients who receive monoclonal antibody treatments wait at least 90 days before being vaccinated.  Currently, there are no data on the safety and efficacy of mRNA COVID-19 vaccines in persons who received monoclonal antibodies or convalescent plasma as part of COVID-19 treatment. Based  on the estimated half-life of such therapies as well as evidence suggesting that reinfection is uncommon in the 90 days after initial infection, vaccination should be deferred for at least 90 days, as a precautionary measure until additional information becomes available, to avoid interference of the antibody treatment with vaccine-induced immune responses. 

## 2020-08-25 NOTE — Progress Notes (Signed)
I connected by phone with [name] on 08/25/2020 at 9:16 AM to discuss the potential use of a new treatment for mild to moderate COVID-19 viral infection in non-hospitalized patients.  This patient is a 49 y.o. male that meets the FDA criteria for Emergency Use Authorization of COVID monoclonal antibody casirivimab/imdevimab.  Has a (+) direct SARS-CoV-2 viral test result  Has mild or moderate COVID-19   Is NOT hospitalized due to COVID-19  Is within 10 days of symptom onset  Has at least one of the high risk factor(s) for progression to severe COVID-19 and/or hospitalization as defined in EUA.  Specific high risk criteria : Diabetes   I have spoken and communicated the following to the patient or parent/caregiver regarding COVID monoclonal antibody treatment:  1. FDA has authorized the emergency use for the treatment of mild to moderate COVID-19 in adults and pediatric patients with positive results of direct SARS-CoV-2 viral testing who are 46 years of age and older weighing at least 40 kg, and who are at high risk for progressing to severe COVID-19 and/or hospitalization.  2. The significant known and potential risks and benefits of COVID monoclonal antibody, and the extent to which such potential risks and benefits are unknown.  3. Information on available alternative treatments and the risks and benefits of those alternatives, including clinical trials.  4. Patients treated with COVID monoclonal antibody should continue to self-isolate and use infection control measures (e.g., wear mask, isolate, social distance, avoid sharing personal items, clean and disinfect "high touch" surfaces, and frequent handwashing) according to CDC guidelines.   5. The patient or parent/caregiver has the option to accept or refuse COVID monoclonal antibody treatment.  After reviewing this information with the patient, The patient agreed to proceed with receiving casirivimab\imdevimab infusion and  will be provided a copy of the Fact sheet prior to receiving the infusion.    [name], NP 08/25/2020 9:16 AM

## 2020-10-13 ENCOUNTER — Ambulatory Visit (INDEPENDENT_AMBULATORY_CARE_PROVIDER_SITE_OTHER): Payer: Self-pay | Admitting: Family Medicine

## 2020-10-13 ENCOUNTER — Encounter: Payer: Self-pay | Admitting: Family Medicine

## 2020-10-13 ENCOUNTER — Other Ambulatory Visit: Payer: Self-pay

## 2020-10-13 VITALS — BP 130/74 | HR 92 | Temp 97.8°F | Ht 76.0 in | Wt 152.0 lb

## 2020-10-13 DIAGNOSIS — E11649 Type 2 diabetes mellitus with hypoglycemia without coma: Secondary | ICD-10-CM

## 2020-10-13 DIAGNOSIS — F418 Other specified anxiety disorders: Secondary | ICD-10-CM

## 2020-10-13 MED ORDER — ALPRAZOLAM 0.5 MG PO TABS: 0.5000 mg | ORAL_TABLET | Freq: Two times a day (BID) | ORAL | 0 refills | Status: DC | PRN

## 2020-10-13 MED ORDER — ESCITALOPRAM OXALATE 10 MG PO TABS: 10.0000 mg | ORAL_TABLET | Freq: Every day | ORAL | 0 refills | Status: DC

## 2020-10-13 NOTE — Progress Notes (Signed)
   Subjective:    Patient ID: Carlos [name], male    DOB: [birth year], 49 y.o.   MRN: [id number]  HPI Here for several issues. First his diabetes I snot well controlled. He says his diet is fairly healthy, but his am fasting glucoses have been running from 175 to 316.he is supposed to be taking Metformin 500 mg 2 tabs BID, but he has been taking only 1 tab BID to save money. He will not have insurance coverage until 42 days from now. Also he has been dealing with a lot of stress the past 6 months, and he thinks he needs help. He is tense all the time and he worries about everything. He has trouble sleeping, but his appetite is intact. He feels sad and hopeless at times, but he is not tearful. He denies any suicidal thoughts.    Review of Systems  Constitutional: Negative.   Respiratory: Negative.   Cardiovascular: Negative.   Psychiatric/Behavioral: Positive for decreased concentration, dysphoric mood and sleep disturbance. Negative for agitation, behavioral problems, confusion, hallucinations, self-injury and suicidal ideas. The patient is nervous/anxious.        Objective:   Physical Exam Constitutional:      Appearance: Normal appearance.  Cardiovascular:     Rate and Rhythm: Normal rate and regular rhythm.     Pulses: Normal pulses.     Heart sounds: Normal heart sounds.  Pulmonary:     Effort: Pulmonary effort is normal.     Breath sounds: Normal breath sounds.  Neurological:     Mental Status: He is alert.  Psychiatric:        Behavior: Behavior normal.        Thought Content: Thought content normal.        Judgment: Judgment normal.     Comments: He is quite anxious            Assessment & Plan:  His diabetes is poorly controlled, so I asked him to increase the Metformin to taking 2 tabs BID as ordered. Hopefully when he has insurance he can obtain his medications more easily. For the anxiety and depression, we will start him on Lexapro 10 mg daily and Xanax 0.5 mg as  needed. He will follow up with Dr. Elease Hashimoto (his PCP) in 3-4 weeks. Written out of work from 10-12-20 until 10-16-20.  Alysia Penna, MD

## 2020-11-06 ENCOUNTER — Other Ambulatory Visit: Payer: Self-pay | Admitting: Family Medicine

## 2020-11-06 NOTE — Telephone Encounter (Signed)
ALPRAZolam Duanne Moron) 0.5 MG tablet  [facility] ([address]), [place] Phone:  [phone]  Fax:  [phone]

## 2020-11-06 NOTE — Telephone Encounter (Signed)
Recommend he avoid further Xanax- to avoid dependency,  should stay on the Lexapro and make sure he has follow up to reassess

## 2020-11-06 NOTE — Telephone Encounter (Signed)
Please deny.

## 2020-11-13 ENCOUNTER — Ambulatory Visit: Payer: Self-pay | Admitting: Family Medicine

## 2020-11-14 ENCOUNTER — Ambulatory Visit: Payer: Self-pay | Admitting: Internal Medicine

## 2020-11-15 ENCOUNTER — Telehealth (INDEPENDENT_AMBULATORY_CARE_PROVIDER_SITE_OTHER): Payer: Self-pay | Admitting: Family Medicine

## 2020-11-15 DIAGNOSIS — E11649 Type 2 diabetes mellitus with hypoglycemia without coma: Secondary | ICD-10-CM

## 2020-11-15 DIAGNOSIS — E785 Hyperlipidemia, unspecified: Secondary | ICD-10-CM

## 2020-11-15 DIAGNOSIS — F418 Other specified anxiety disorders: Secondary | ICD-10-CM

## 2020-11-15 MED ORDER — METFORMIN HCL 500 MG PO TABS
1000.0000 mg | ORAL_TABLET | Freq: Two times a day (BID) | ORAL | 3 refills | Status: DC
Start: 2020-11-15 — End: 2021-07-16

## 2020-11-15 MED ORDER — GLIMEPIRIDE 2 MG PO TABS: 2.0000 mg | ORAL_TABLET | Freq: Every day | ORAL | 3 refills | Status: DC

## 2020-11-15 MED ORDER — ESCITALOPRAM OXALATE 10 MG PO TABS
10.0000 mg | ORAL_TABLET | Freq: Every day | ORAL | 3 refills | Status: DC
Start: 2020-11-15 — End: 2021-10-04

## 2020-11-15 NOTE — Progress Notes (Signed)
Patient ID: Carlos [name], male   DOB: October 27, 1971, 49 y.o.   MRN: [id number]  This visit type was conducted due to national recommendations for restrictions regarding the COVID-19 pandemic in an effort to limit this patient's exposure and mitigate transmission in our community.   Virtual Visit via Video Note  I connected with Carlos [name] on 11/15/20 at  7:30 AM EST by a video enabled telemedicine application and verified that I am speaking with the correct person using two identifiers.  Location patient: home Location provider:work or home office Persons participating in the virtual visit: patient, provider  I discussed the limitations of evaluation and management by telemedicine and the availability of in person appointments. The patient expressed understanding and agreed to proceed.   HPI: Carlos [name] has history of dyslipidemia and type 2 diabetes.  He had recent virtual visit and was placed on Lexapro and given some Xanax for some increased situational type stress.  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  He is found this work very stressful.  He does feel Lexapro has helped some.  Denies any major depression issues at this time.  He has not taken alprazolam regularly.  Type 2 diabetes.  He had run out of all medications recently and started back on Metformin currently taking 1000 mg twice daily.  He has had some polyuria and polydipsia which she states is relatively chronic.  Does not have home glucose monitor but his mother had a monitor and tested his sugar yesterday which was 217 fasting.  He has had some recent bilateral blurred vision.  Poor compliance with follow-up secondary to no insurance.  His new insurance should kick in in a couple of months.  Last A1c on record was 6.7% back in 2019.  At that time, he was taking combination of Metformin and Jardiance.  He wants to explore other medication options at this time that  would be less expensive until his insurance can kick in.  He has been on Lipitor in the past but is currently not taking that either.   ROS: See pertinent positives and negatives per HPI.  Past Medical History:  Diagnosis Date  . ADHD (attention deficit hyperactivity disorder)   . Alcohol abuse   . Depression   . History of exercise stress test    03-18-2013--  normal  . History of kidney stones   . Hyperlipidemia   . Kidney stones   . Left ureteral stone   . Type 2 diabetes mellitus ([place])     Past Surgical History:  Procedure Laterality Date  . CARDIAC CATHETERIZATION  11-27-2006  dr Verlon Setting   non-obstructive CAD/  20% proximal and mid LAD/  perserved LVF,  ef 55-60%  . CYSTOSCOPY W/ RETROGRADES Left 05/17/2015   Procedure: CYSTOSCOPY WITH RETROGRADE PYELOGRAM;  Surgeon: Festus Aloe, MD;  Location: [facility];  Service: Urology;  Laterality: Left;  . CYSTOSCOPY/URETEROSCOPY/HOLMIUM LASER/STENT PLACEMENT Left 05/17/2015   Procedure: LEFT URETEROSCOPY/HOLMIUM LASER/STENT PLACEMENT;  Surgeon: Festus Aloe, MD;  Location: [facility];  Service: Urology;  Laterality: Left;  . EXTRACORPOREAL SHOCK WAVE LITHOTRIPSY Left 05-08-2015  . EXTRACTION RIGHT MANDIBULAR , PREMOLAR/  MAXILLARY MANDIBULAR FIXATION WITH SCREWS  08-03-2008  . ORIF FOUR HOLD PLATE AND MAXILLOMANDIBULAR FIXATION W/ ARCH BARS  08-05-2008   REMOVAL ARCH BARS 09-15-2008  . TRANSTHORACIC ECHOCARDIOGRAM  04-06-2008  dr Verlon Setting   normal LVF,  ef 55-60%,  trivial MR and TR    Family History  Problem Relation Age of Onset  . Diabetes Mother        type 1  . Heart disease Mother        pacemaker  . Alcohol abuse Father   . Alcohol abuse Maternal Uncle   . Diabetes Maternal Grandmother   . Alcohol abuse Maternal Grandfather   . Diabetes Maternal Grandfather     SOCIAL HX: Non-smoker   Current Outpatient Medications:  .  ALPRAZolam (XANAX) 0.5 MG tablet, Take 1 tablet (0.5 mg  total) by mouth 2 (two) times daily as needed for anxiety or sleep., Disp: 60 tablet, Rfl: 0 .  aspirin 81 MG tablet, Take 81 mg by mouth every morning. , Disp: , Rfl:  .  atorvastatin (LIPITOR) 20 MG tablet, Take 1 tablet (20 mg total) by mouth daily., Disp: 90 tablet, Rfl: 3 .  escitalopram (LEXAPRO) 10 MG tablet, Take 1 tablet (10 mg total) by mouth daily., Disp: 30 tablet, Rfl: 0 .  glucose blood (ONETOUCH VERIO) test strip, Test once daily Dx E11.9, Disp: 100 each, Rfl: 3 .  metFORMIN (GLUCOPHAGE) 500 MG tablet, Take 2 tablets (1,000 mg total) by mouth 2 (two) times daily with a meal., Disp: 360 tablet, Rfl: 3 .  Multiple Vitamin (MULTIVITAMIN WITH MINERALS) TABS tablet, Take 1 tablet by mouth every morning. , Disp: , Rfl:  .  ONE TOUCH LANCETS MISC, Test once daily.  Onetouch Verio Flex E11.9, Disp: 100 each, Rfl: 3  EXAM:  VITALS per patient if applicable:  GENERAL: alert, oriented, appears well and in no acute distress  HEENT: atraumatic, conjunttiva clear, no obvious abnormalities on inspection of external nose and ears  NECK: normal movements of the head and neck  LUNGS: on inspection no signs of respiratory distress, breathing rate appears normal, no obvious gross SOB, gasping or wheezing  CV: no obvious cyanosis  MS: moves all visible extremities without noticeable abnormality  PSYCH/NEURO: pleasant and cooperative, no obvious depression or anxiety, speech and thought processing grossly intact  ASSESSMENT AND PLAN:  Discussed the following assessment and plan:  #1 recent situational stress/anxiety.  Symptomatically improved on Lexapro  -Refill Lexapro for once daily use. -We strongly advise regular use of benzodiazepines especially with his past history of alcohol abuse.  #2 type 2 diabetes poorly controlled currently on Metformin  -We have cost limitations of medications.  Would eventually like to get him back on Jardiance which has worked well for him in the past.   In the meantime until insurance kicks in will start low-dose glimepiride 2 mg daily and may need to titrate up further.  He is aware of potential hypoglycemic risk.  He is advised not to skip any meals  #3 history of dyslipidemia.  Recommend getting back on Lipitor but he wishes to wait until insurance kicks in.  He will schedule follow-up as soon as his insurance is in place and get some baseline labs then     I discussed the assessment and treatment plan with the patient. The patient was provided an opportunity to ask questions and all were answered. The patient agreed with the plan and demonstrated an understanding of the instructions.   The patient was advised to call back or seek an in-person evaluation if the symptoms worsen or if the condition fails to improve as anticipated.     Carolann Littler, MD

## 2020-11-27 ENCOUNTER — Telehealth: Payer: Self-pay | Admitting: Family Medicine

## 2020-11-27 NOTE — Telephone Encounter (Signed)
Pt is calling in wanting to have a work note reflecting 11/14/2020-11/23/2020 but pt was not seen until 11/15/2020 virtually by Dr. Elease Hashimoto.  Pt is aware that it is up to the provider when he compose the letter to state what dates he is going to cover.  Pt verbalized understanding.

## 2020-11-28 NOTE — Telephone Encounter (Signed)
Letter is ready and patient is aware.

## 2020-11-28 NOTE — Telephone Encounter (Signed)
Okay to include dates from 11/30-12/9

## 2020-12-28 ENCOUNTER — Other Ambulatory Visit: Payer: Self-pay

## 2020-12-28 ENCOUNTER — Telehealth: Payer: Self-pay | Admitting: Family Medicine

## 2020-12-28 MED ORDER — EMPAGLIFLOZIN 10 MG PO TABS: 10.0000 mg | ORAL_TABLET | Freq: Every day | ORAL | 5 refills | Status: DC

## 2020-12-28 NOTE — Telephone Encounter (Signed)
Pt is requesting a medication that Dr Elease Hashimoto prescribe  For him awhile ago jardiance  Need a call from the nurse  To see if he can get a refill

## 2020-12-28 NOTE — Telephone Encounter (Signed)
Rx sent to [facility] with note attached stating an appt is needed.  Unable to leave a message at the pts cell number due to voicemail being full.

## 2020-12-28 NOTE — Telephone Encounter (Signed)
Start back Jardiance 10 mg po qd #30 with 5 refills.  Hopefully, can get office follow up within 3 months to reassess.

## 2021-06-05 ENCOUNTER — Telehealth: Payer: Self-pay | Admitting: Family Medicine

## 2021-06-05 NOTE — Telephone Encounter (Signed)
[place] for labs?

## 2021-06-05 NOTE — Telephone Encounter (Signed)
Pt is calling in needing some labs done for a program that he is enrolled in.  Lab sthat he needs is HEP- B and C, TB and HIV.  Pt would like to have a call back as to when he is able to have them done.

## 2021-06-06 NOTE — Telephone Encounter (Signed)
Left message for patient to call back  

## 2021-06-07 NOTE — Telephone Encounter (Signed)
Left message for patient to call back  

## 2021-06-08 NOTE — Telephone Encounter (Signed)
Left message for patient to call back  

## 2021-06-11 NOTE — Telephone Encounter (Signed)
Unable to reach the patient. Message has been sent Via mychart. Message will be closed.

## 2021-06-14 ENCOUNTER — Telehealth: Payer: Self-pay | Admitting: Family Medicine

## 2021-06-14 NOTE — Telephone Encounter (Signed)
Pt is calling wanting to get scheduled for labs and he did not say which labs he is suppose to be having.  May I have lab orders please.

## 2021-06-14 NOTE — Telephone Encounter (Signed)
Spoke with the patient. He is aware that Dr. Elease Hashimoto has asked him to schedule an appointment with [place]. Appointment has been scheduled.

## 2021-06-22 ENCOUNTER — Encounter: Payer: Self-pay | Admitting: Family Medicine

## 2021-06-22 ENCOUNTER — Ambulatory Visit: Payer: Self-pay | Admitting: Family Medicine

## 2021-06-27 ENCOUNTER — Encounter: Payer: Self-pay | Admitting: Family Medicine

## 2021-07-02 ENCOUNTER — Encounter: Payer: Self-pay | Admitting: Family Medicine

## 2021-07-06 ENCOUNTER — Encounter: Payer: Self-pay | Admitting: Family Medicine

## 2021-07-11 ENCOUNTER — Encounter: Payer: Self-pay | Admitting: Family Medicine

## 2021-07-12 ENCOUNTER — Other Ambulatory Visit: Payer: Self-pay | Admitting: Family Medicine

## 2021-07-12 ENCOUNTER — Encounter: Payer: Self-pay | Admitting: Family Medicine

## 2021-07-12 ENCOUNTER — Other Ambulatory Visit: Payer: Self-pay

## 2021-07-12 ENCOUNTER — Ambulatory Visit (INDEPENDENT_AMBULATORY_CARE_PROVIDER_SITE_OTHER): Payer: BC Managed Care – PPO | Admitting: Family Medicine

## 2021-07-12 VITALS — BP 147/87 | HR 77 | Ht 74.0 in | Wt 140.2 lb

## 2021-07-12 DIAGNOSIS — E785 Hyperlipidemia, unspecified: Secondary | ICD-10-CM | POA: Diagnosis not present

## 2021-07-12 DIAGNOSIS — E11649 Type 2 diabetes mellitus with hypoglycemia without coma: Secondary | ICD-10-CM | POA: Diagnosis not present

## 2021-07-12 DIAGNOSIS — M79675 Pain in left toe(s): Secondary | ICD-10-CM

## 2021-07-12 DIAGNOSIS — F1010 Alcohol abuse, uncomplicated: Secondary | ICD-10-CM

## 2021-07-12 DIAGNOSIS — Z0000 Encounter for general adult medical examination without abnormal findings: Secondary | ICD-10-CM

## 2021-07-12 DIAGNOSIS — R634 Abnormal weight loss: Secondary | ICD-10-CM

## 2021-07-12 MED ORDER — DICLOFENAC SODIUM 1 % EX GEL: 4.0000 g | Freq: Four times a day (QID) | CUTANEOUS | 6 refills | Status: DC | PRN

## 2021-07-12 MED ORDER — BLOOD GLUCOSE MONITOR KIT: PACK | 11 refills | Status: DC

## 2021-07-12 NOTE — Progress Notes (Signed)
Office Visit Note   Patient: Carlos [name]           Date of Birth: August 07, 1971           MRN: [id number] Visit Date: 07/12/2021 Requested by: Eulas Post, MD [place],  [place] [id number] PCP: Eulas Post, MD  Subjective: Chief Complaint  Patient presents with   physical    HPI: He is here for a wellness examination.  His mother is the best friend of one of my patients and he needed to be seen quickly.  He is an alcoholic and has decided to enter a treatment program for 7 months.  This will start on Monday.  He needs to have TB skin testing and other labs.  He is also due for monitoring of diabetes.  He has not been to a doctor in a while due to lack of insurance.  He does not check his blood sugars regularly but when he does, they are usually around 130 fasting.  His last A1c was 6.4.  He has occasional symptoms of neuropathy in his feet but has not been diagnosed with it, and has not had any retinopathy or nephropathy troubles.  He has lost 30 pounds from his normal body weight unintentionally in the past year.  His appetite is not as good as it used to be.  Denies any fevers or chills, night sweats.  He has not had colon cancer screening.  His mother has a history of colon cancer.  From an alcohol standpoint, he used to drink a sixpack daily but now this last him about a week.  He has a history of hyperlipidemia but has not been on medicine for a while.  He dropped something on his left great toe a few months ago and it continues to cause pain.  Family history was updated.                ROS:   All other systems were reviewed and are negative.  Objective: Vital Signs: BP (!) 147/87 (BP Location: Left Arm, Patient Position: Sitting, Cuff Size: Normal)   Pulse 77   Ht 6' 2"  (1.88 m)   Wt 140 lb 3.2 oz (63.6 kg)   BMI 18.00 kg/m   Physical Exam:  General:  Alert and oriented, in no acute distress. Pulm:  Breathing unlabored. Psy:  Normal  mood, congruent affect. Skin: No suspicious lesions seen.  No lesions on his feet. HEENT:  [place]/AT, PERRLA, EOM Full, no nystagmus.  Funduscopic examination within normal limits.  No conjunctival erythema.  Tympanic membranes are pearly gray with normal landmarks.  External ear canals are normal.  Nasal passages are clear.  Oropharynx is clear.  No significant lymphadenopathy.  No thyromegaly or nodules.  2+ carotid pulses without bruits. CV: Regular rate and rhythm without murmurs, rubs, or gallops.  No peripheral edema.  2+ radial and posterior tibial pulses. Lungs: Clear to auscultation throughout with no wheezing or areas of consolidation. Abd: Bowel sounds are active, no hepatosplenomegaly or masses.  Soft and nontender.  No audible bruits.  No evidence of ascites. Extremities: Hypoactive knee and ankle DTRs.  No nail deformities.  Left great toe MTP joint is tender to palpation on the dorsum but he has good range of motion.    Imaging: No results found.  Assessment & Plan: Wellness examination -Labs drawn including those needed for his treatment program.  2.  Diabetes - Currently on metformin.  I anticipate  that eliminating alcohol will make a big difference in his blood sugars, blood pressure, and lipids. - Monitor labs again in about 3 to 6 months.  3.  Hyperlipidemia - Recheck today.  4.  Alcohol abuse - Treatment program next week.  5.  Left great toe pain - Voltaren gel, glucosamine, turmeric.  X-rays and possibly injection if symptoms persist.  6.  Unintended weight loss -Colonoscopy referral.    Procedures: No procedures performed        PMFS History: Patient Active Problem List   Diagnosis Date Noted   Depression with anxiety 10/13/2020   Family history of colon cancer 09/15/2018   Type 2 diabetes mellitus, uncontrolled ([place]) 05/12/2014   Varicose veins 05/12/2014   ADD (attention deficit disorder) 04/07/2013   Alcohol abuse 12/23/2012   Dyslipidemia  12/23/2012   Past Medical History:  Diagnosis Date   ADHD (attention deficit hyperactivity disorder)    Alcohol abuse    Depression    History of exercise stress test    03-18-2013--  normal   History of kidney stones    Hyperlipidemia    Kidney stones    Left ureteral stone    Type 2 diabetes mellitus ([place])     Family History  Problem Relation Age of Onset   Cancer Mother    Diabetes Mother        type 1   Heart disease Mother        pacemaker   Cancer - Colon Mother    Alcohol abuse Father    Alcohol abuse Maternal Uncle    Diabetes Maternal Grandmother    Alcohol abuse Maternal Grandfather    Diabetes Maternal Grandfather    Prostate cancer Neg Hx     Past Surgical History:  Procedure Laterality Date   CARDIAC CATHETERIZATION  11-27-2006  dr Verlon Setting   non-obstructive CAD/  20% proximal and mid LAD/  perserved LVF,  ef 55-60%   CYSTOSCOPY W/ RETROGRADES Left 05/17/2015   Procedure: CYSTOSCOPY WITH RETROGRADE PYELOGRAM;  Surgeon: Festus Aloe, MD;  Location: [facility];  Service: Urology;  Laterality: Left;   CYSTOSCOPY/URETEROSCOPY/HOLMIUM LASER/STENT PLACEMENT Left 05/17/2015   Procedure: LEFT URETEROSCOPY/HOLMIUM LASER/STENT PLACEMENT;  Surgeon: Festus Aloe, MD;  Location: [facility];  Service: Urology;  Laterality: Left;   EXTRACORPOREAL SHOCK WAVE LITHOTRIPSY Left 05-08-2015   EXTRACTION RIGHT MANDIBULAR , PREMOLAR/  MAXILLARY MANDIBULAR FIXATION WITH SCREWS  08-03-2008   ORIF FOUR HOLD PLATE AND MAXILLOMANDIBULAR FIXATION W/ ARCH BARS  08-05-2008   REMOVAL ARCH BARS 09-15-2008   TRANSTHORACIC ECHOCARDIOGRAM  04-06-2008  dr Verlon Setting   normal LVF,  ef 55-60%,  trivial MR and TR   Social History   Occupational History   Not on file  Tobacco Use   Smoking status: Never   Smokeless tobacco: Never  Vaping Use   Vaping Use: Never used  Substance and Sexual Activity   Alcohol use: Yes    Comment: casual   Drug use: No   Sexual  activity: Not on file

## 2021-07-13 LAB — COMPREHENSIVE METABOLIC PANEL
AG Ratio: 1.7 (calc) (ref 1.0–2.5)
ALT: 7 U/L — ABNORMAL LOW (ref 9–46)
AST: 10 U/L (ref 10–40)
Albumin: 4.3 g/dL (ref 3.6–5.1)
Alkaline phosphatase (APISO): 61 U/L (ref 36–130)
BUN: 11 mg/dL (ref 7–25)
CO2: 29 mmol/L (ref 20–32)
Calcium: 10.1 mg/dL (ref 8.6–10.3)
Chloride: 95 mmol/L — ABNORMAL LOW (ref 98–110)
Creat: 0.93 mg/dL (ref 0.60–1.29)
Globulin: 2.6 g/dL (calc) (ref 1.9–3.7)
Glucose, Bld: 191 mg/dL — ABNORMAL HIGH (ref 65–99)
Potassium: 4.5 mmol/L (ref 3.5–5.3)
Sodium: 135 mmol/L (ref 135–146)
Total Bilirubin: 0.6 mg/dL (ref 0.2–1.2)
Total Protein: 6.9 g/dL (ref 6.1–8.1)

## 2021-07-13 LAB — CBC WITH DIFFERENTIAL/PLATELET
Absolute Monocytes: 504 cells/uL (ref 200–950)
Basophils Absolute: 43 cells/uL (ref 0–200)
Basophils Relative: 0.6 %
Eosinophils Absolute: 199 cells/uL (ref 15–500)
Eosinophils Relative: 2.8 %
HCT: 47.6 % (ref 38.5–50.0)
Hemoglobin: 15.8 g/dL (ref 13.2–17.1)
Lymphs Abs: 1988 cells/uL (ref 850–3900)
MCH: 32.2 pg (ref 27.0–33.0)
MCHC: 33.2 g/dL (ref 32.0–36.0)
MCV: 96.9 fL (ref 80.0–100.0)
MPV: 9.5 fL (ref 7.5–12.5)
Monocytes Relative: 7.1 %
Neutro Abs: 4367 cells/uL (ref 1500–7800)
Neutrophils Relative %: 61.5 %
Platelets: 229 10*3/uL (ref 140–400)
RBC: 4.91 10*6/uL (ref 4.20–5.80)
RDW: 12 % (ref 11.0–15.0)
Total Lymphocyte: 28 %
WBC: 7.1 10*3/uL (ref 3.8–10.8)

## 2021-07-13 LAB — LIPID PANEL
Cholesterol: 212 mg/dL — ABNORMAL HIGH (ref <200)
HDL: 56 mg/dL (ref >=40)
LDL Cholesterol (Calc): 114 mg/dL (calc) — ABNORMAL HIGH
Non-HDL Cholesterol (Calc): 156 mg/dL (calc) — ABNORMAL HIGH (ref <130)
Total CHOL/HDL Ratio: 3.8 (calc) (ref <5.0)
Triglycerides: 315 mg/dL — ABNORMAL HIGH (ref <150)

## 2021-07-13 LAB — HEMOGLOBIN A1C
Hgb A1c MFr Bld: 8.1 % of total Hgb — ABNORMAL HIGH (ref <5.7)
Mean Plasma Glucose: 186 mg/dL
eAG (mmol/L): 10.3 mmol/L

## 2021-07-13 LAB — HIV ANTIBODY (ROUTINE TESTING W REFLEX): HIV 1&2 Ab, 4th Generation: NONREACTIVE

## 2021-07-13 LAB — HEPATITIS B SURFACE ANTIBODY,QUALITATIVE: Hep B S Ab: NONREACTIVE

## 2021-07-13 LAB — GAMMA GT: GGT: 23 U/L (ref 3–95)

## 2021-07-13 LAB — THYROID PANEL WITH TSH
Free Thyroxine Index: 1.7 (ref 1.4–3.8)
T3 Uptake: 35 % (ref 22–35)
T4, Total: 4.9 ug/dL (ref 4.9–10.5)
TSH: 2.67 mIU/L (ref 0.40–4.50)

## 2021-07-13 LAB — HEPATITIS A ANTIBODY, IGM: Hep A IgM: NONREACTIVE

## 2021-07-13 LAB — HEPATITIS B SURFACE ANTIGEN: Hepatitis B Surface Ag: NONREACTIVE

## 2021-07-13 LAB — MICROALBUMIN, URINE: Microalb, Ur: 1.1 mg/dL

## 2021-07-13 LAB — HIGH SENSITIVITY CRP: hs-CRP: 0.9 mg/L

## 2021-07-13 LAB — PSA: PSA: 0.28 ng/mL (ref <=4.00)

## 2021-07-14 ENCOUNTER — Telehealth: Payer: Self-pay | Admitting: Family Medicine

## 2021-07-14 MED ORDER — GEMFIBROZIL 600 MG PO TABS: 600.0000 mg | ORAL_TABLET | Freq: Two times a day (BID) | ORAL | 6 refills | Status: DC

## 2021-07-14 NOTE — Telephone Encounter (Signed)
Labs show:    Diabetes not well controlled.  Resuming metformin and eating a diabetic diet will help.  Triglycerides will improve with above, but I will also call in Lopid to help.  The above should be monitored in 3-4 months.  All else looks good.  Please fax to his facility that required the labs.

## 2021-07-15 LAB — QUANTIFERON-TB GOLD PLUS
Mitogen-NIL: >10 IU/mL
NIL: 0.02 IU/mL
QuantiFERON-TB Gold Plus: NEGATIVE
TB1-NIL: 0 IU/mL
TB2-NIL: 0 IU/mL

## 2021-07-16 ENCOUNTER — Telehealth: Payer: Self-pay | Admitting: Family Medicine

## 2021-07-16 MED ORDER — EMPAGLIFLOZIN 10 MG PO TABS: 10.0000 mg | ORAL_TABLET | Freq: Every day | ORAL | 3 refills | Status: DC

## 2021-07-16 MED ORDER — METFORMIN HCL 500 MG PO TABS
1000.0000 mg | ORAL_TABLET | Freq: Two times a day (BID) | ORAL | 3 refills | Status: DC
Start: 2021-07-16 — End: 2021-10-04

## 2021-07-16 NOTE — Telephone Encounter (Signed)
TB test is negative/normal.

## 2021-07-16 NOTE — Addendum Note (Signed)
Addended by: Hortencia Pilar on: 07/16/2021 11:31 AM   Modules accepted: Orders

## 2021-07-16 NOTE — Telephone Encounter (Signed)
Results of this test and the other tests, along with a current medication and allergy list, faxed to attn: [place] at [facility] [phone].

## 2021-07-16 NOTE — Telephone Encounter (Signed)
Please see the other 3 messages from today. I called the patient and advised Jardiance, Metformin and Gemfibrozil were sent in to his pharmacy, to go with the other 2 scripts for a glucometer + supplies and diclofenac 1% gel.

## 2021-07-16 NOTE — Telephone Encounter (Signed)
Patient advised.

## 2021-07-16 NOTE — Telephone Encounter (Signed)
Please advise 

## 2021-07-16 NOTE — Telephone Encounter (Signed)
Gemfibrozil was sent in on 7/30. The Metformin has not been refilled yet, according to the chart. The patient is adamant that the Jardiance be refilled, since the A1C was over 8%. Please advise.

## 2021-07-16 NOTE — Telephone Encounter (Signed)
Pt is wondering if you're going to call in his medications. He can't tell me what medications they are.   CB [phone]

## 2021-07-16 NOTE — Telephone Encounter (Signed)
Pt called requesting a call back from [place]. Pt states he is still confused about certain medications. Pt did not go into detail  about issue.  Please call pt at [phone].

## 2021-07-25 ENCOUNTER — Telehealth: Payer: Self-pay | Admitting: Family Medicine

## 2021-07-25 ENCOUNTER — Other Ambulatory Visit: Payer: Self-pay

## 2021-07-25 ENCOUNTER — Emergency Department (HOSPITAL_BASED_OUTPATIENT_CLINIC_OR_DEPARTMENT_OTHER)
Admission: EM | Admit: 2021-07-25 | Discharge: 2021-07-25 | Disposition: A | Discharge status: Home / Self Care | Payer: BC Managed Care – PPO | Attending: Emergency Medicine | Admitting: Emergency Medicine

## 2021-07-25 ENCOUNTER — Encounter (HOSPITAL_BASED_OUTPATIENT_CLINIC_OR_DEPARTMENT_OTHER): Payer: Self-pay | Admitting: Emergency Medicine

## 2021-07-25 DIAGNOSIS — Z7984 Long term (current) use of oral hypoglycemic drugs: Secondary | ICD-10-CM | POA: Diagnosis not present

## 2021-07-25 DIAGNOSIS — F606 Avoidant personality disorder: Secondary | ICD-10-CM | POA: Insufficient documentation

## 2021-07-25 DIAGNOSIS — R739 Hyperglycemia, unspecified: Secondary | ICD-10-CM | POA: Diagnosis not present

## 2021-07-25 DIAGNOSIS — E1169 Type 2 diabetes mellitus with other specified complication: Secondary | ICD-10-CM | POA: Diagnosis not present

## 2021-07-25 DIAGNOSIS — E1165 Type 2 diabetes mellitus with hyperglycemia: Secondary | ICD-10-CM | POA: Diagnosis not present

## 2021-07-25 LAB — CBG MONITORING, ED
Glucose-Capillary: 121 mg/dL — ABNORMAL HIGH (ref 70–99)
Glucose-Capillary: 147 mg/dL — ABNORMAL HIGH (ref 70–99)

## 2021-07-25 NOTE — ED Provider Notes (Signed)
[place] EMERGENCY DEPARTMENT Provider Note   CSN: [id number] Arrival date & time: 07/25/21  0844     History Chief Complaint  Patient presents with   Blood Sugar Problem    Carlos [name] is a 50 y.o. male.  HPI Patient is a 50 year old male who presents for blood sugar concerns.  He does have diabetes.  His blood sugar is currently managed with 3 oral agents: Metformin, glimepiride, and Jardiance.  The latter 2 of these medications were started 1 week ago.  Patient has been on metformin for several years.  He has a history of alcohol abuse and is currently in an inpatient alcohol rehab facility.  He has been checking his blood sugars frequently and finds them to be in the range of 120 - 450.  Because of the wide range of blood sugar readings, patient has become quite anxious.  He told the staff at his rehab facility about these blood sugars.  He states that the staff then told him that he would need to see an endocrinologist or else he would not be able to stay there.  For this reason, the patient presents to the ED.  He denies any recent symptoms other than some blurry vision that he states is only present when his blood sugars are high.    Past Medical History:  Diagnosis Date   ADHD (attention deficit hyperactivity disorder)    Alcohol abuse    Depression    History of exercise stress test    03-18-2013--  normal   History of kidney stones    Hyperlipidemia    Kidney stones    Left ureteral stone    Type 2 diabetes mellitus [facility])     Patient Active Problem List   Diagnosis Date Noted   Depression with anxiety 10/13/2020   Family history of colon cancer 09/15/2018   Type 2 diabetes mellitus, uncontrolled ([place]) 05/12/2014   Varicose veins 05/12/2014   ADD (attention deficit disorder) 04/07/2013   Alcohol abuse 12/23/2012   Dyslipidemia 12/23/2012    Past Surgical History:  Procedure Laterality Date   CARDIAC CATHETERIZATION  11-27-2006  dr Verlon Setting    non-obstructive CAD/  20% proximal and mid LAD/  perserved LVF,  ef 55-60%   CYSTOSCOPY W/ RETROGRADES Left 05/17/2015   Procedure: CYSTOSCOPY WITH RETROGRADE PYELOGRAM;  Surgeon: Festus Aloe, MD;  Location: [facility];  Service: Urology;  Laterality: Left;   CYSTOSCOPY/URETEROSCOPY/HOLMIUM LASER/STENT PLACEMENT Left 05/17/2015   Procedure: LEFT URETEROSCOPY/HOLMIUM LASER/STENT PLACEMENT;  Surgeon: Festus Aloe, MD;  Location: [facility];  Service: Urology;  Laterality: Left;   EXTRACORPOREAL SHOCK WAVE LITHOTRIPSY Left 05-08-2015   EXTRACTION RIGHT MANDIBULAR , PREMOLAR/  MAXILLARY MANDIBULAR FIXATION WITH SCREWS  08-03-2008   ORIF FOUR HOLD PLATE AND MAXILLOMANDIBULAR FIXATION W/ ARCH BARS  08-05-2008   REMOVAL ARCH BARS 09-15-2008   TRANSTHORACIC ECHOCARDIOGRAM  04-06-2008  dr Verlon Setting   normal LVF,  ef 55-60%,  trivial MR and TR       Family History  Problem Relation Age of Onset   Cancer Mother    Diabetes Mother        type 1   Heart disease Mother        pacemaker   Cancer - Colon Mother    Alcohol abuse Father    Alcohol abuse Maternal Uncle    Diabetes Maternal Grandmother    Alcohol abuse Maternal Grandfather    Diabetes Maternal Grandfather    Prostate cancer  Neg Hx     Social History   Tobacco Use   Smoking status: Never   Smokeless tobacco: Never  Vaping Use   Vaping Use: Never used  Substance Use Topics   Alcohol use: Yes    Comment: casual   Drug use: No    Home Medications Prior to Admission medications   Medication Sig Start Date End Date Taking? Authorizing Provider  ALPRAZolam Duanne Moron) 0.5 MG tablet Take 1 tablet (0.5 mg total) by mouth 2 (two) times daily as needed for anxiety or sleep. Patient not taking: Reported on 07/12/2021 10/13/20   Laurey Morale, MD  aspirin 81 MG tablet Take 81 mg by mouth every morning.  Patient not taking: Reported on 07/12/2021    [provider]  atorvastatin (LIPITOR) 20 MG  tablet Take 1 tablet (20 mg total) by mouth daily. Patient not taking: Reported on 07/12/2021 01/27/18   Eulas Post, MD  blood glucose meter kit and supplies KIT Dispense based on patient and insurance preference. Use up to four times daily as directed. 07/12/21   Hilts, Legrand Como, MD  diclofenac Sodium (VOLTAREN) 1 % GEL Apply 4 g topically 4 (four) times daily as needed. 07/12/21   Hilts, Legrand Como, MD  empagliflozin (JARDIANCE) 10 MG TABS tablet Take 1 tablet (10 mg total) by mouth daily before breakfast. 07/16/21   Hilts, Legrand Como, MD  escitalopram (LEXAPRO) 10 MG tablet Take 1 tablet (10 mg total) by mouth daily. Patient not taking: Reported on 07/12/2021 11/15/20   Eulas Post, MD  gemfibrozil (LOPID) 600 MG tablet Take 1 tablet (600 mg total) by mouth 2 (two) times daily before a meal. 07/14/21   Hilts, Legrand Como, MD  glimepiride (AMARYL) 2 MG tablet Take 1 tablet (2 mg total) by mouth daily before breakfast. Patient not taking: Reported on 07/12/2021 11/15/20   Eulas Post, MD  glucose blood (ONETOUCH VERIO) test strip Test once daily Dx E11.9 09/15/18   Eulas Post, MD  metFORMIN (GLUCOPHAGE) 500 MG tablet Take 2 tablets (1,000 mg total) by mouth 2 (two) times daily with a meal. 07/16/21   Hilts, Legrand Como, MD  Multiple Vitamin (MULTIVITAMIN WITH MINERALS) TABS tablet Take 1 tablet by mouth every morning.  Patient not taking: Reported on 07/12/2021    [provider]  ONE TOUCH LANCETS MISC Test once daily.  Roma Schanz Flex E11.9 12/19/15   Burchette, Alinda Sierras, MD    Allergies    Ambien [zolpidem tartrate]  Review of Systems   Review of Systems  Constitutional:  Negative for activity change, appetite change, chills, fatigue and fever.  HENT:  Negative for ear pain and sore throat.   Eyes:  Positive for visual disturbance (Blurry vision when blood sugars are high). Negative for photophobia, pain and redness.  Respiratory:  Negative for cough and shortness of breath.    Cardiovascular:  Negative for chest pain and palpitations.  Gastrointestinal:  Negative for abdominal pain, diarrhea, nausea and vomiting.  Endocrine: Negative for polydipsia and polyuria.  Genitourinary:  Negative for dysuria, flank pain, hematuria and urgency.  Musculoskeletal:  Negative for arthralgias, back pain, gait problem, myalgias and neck pain.  Skin:  Negative for color change and rash.  Neurological:  Negative for dizziness, seizures, syncope, weakness, light-headedness, numbness and headaches.  Psychiatric/Behavioral:  Negative for agitation, confusion and decreased concentration. The patient is nervous/anxious.   All other systems reviewed and are negative.  Physical Exam Updated Vital Signs BP (!) 125/91   Pulse  81   Temp (!) 97.4 F (36.3 C) (Oral)   Resp 18   Ht 6' 2"  (1.88 m)   Wt 63.6 kg   SpO2 100%   BMI 18.00 kg/m   Physical Exam Vitals and nursing note reviewed.  Constitutional:      General: He is not in acute distress.    Appearance: Normal appearance. He is well-developed. He is not ill-appearing, toxic-appearing or diaphoretic.  HENT:     Head: Normocephalic and atraumatic.     Right Ear: External ear normal.     Left Ear: External ear normal.     Nose: Nose normal.     Mouth/Throat:     Mouth: Mucous membranes are moist.     Pharynx: Oropharynx is clear.  Eyes:     Conjunctiva/sclera: Conjunctivae normal.  Cardiovascular:     Rate and Rhythm: Normal rate and regular rhythm.     Heart sounds: No murmur heard. Pulmonary:     Effort: Pulmonary effort is normal. No respiratory distress.     Breath sounds: Normal breath sounds.  Abdominal:     Palpations: Abdomen is soft.     Tenderness: There is no abdominal tenderness.  Musculoskeletal:        General: No swelling.     Cervical back: Normal range of motion and neck supple.     Right lower leg: No edema.     Left lower leg: No edema.  Skin:    General: Skin is warm and dry.      Coloration: Skin is not jaundiced or pale.  Neurological:     General: No focal deficit present.     Mental Status: He is alert and oriented to person, place, and time.     Cranial Nerves: No cranial nerve deficit.     Motor: No weakness.  Psychiatric:        Attention and Perception: Attention and perception normal.        Mood and Affect: Affect normal. Mood is anxious.        Speech: Speech normal.        Behavior: Behavior normal. Behavior is cooperative.        Thought Content: Thought content normal.    ED Results / Procedures / Treatments   Labs (all labs ordered are listed, but only abnormal results are displayed) Labs Reviewed  CBG MONITORING, ED - Abnormal; Notable for the following components:      Result Value   Glucose-Capillary 121 (*)    All other components within normal limits  CBG MONITORING, ED - Abnormal; Notable for the following components:   Glucose-Capillary 147 (*)    All other components within normal limits    EKG None  Radiology No results found.  Procedures Procedures   Medications Ordered in ED Medications - No data to display  ED Course  I have reviewed the triage vital signs and the nursing notes.  Pertinent labs & imaging results that were available during my care of the patient were reviewed by me and considered in my medical decision making (see chart for details).    MDM Rules/Calculators/A&P                           Patient is a 50 year old male with history of diabetes, currently on 3 oral antihyperglycemic medications, who presents for concerns of variations in his glucometer readings.  He has had ranges of blood sugars from 120-450.  He has grown increasingly concerned about this.  He mentioned his concerns to the staff at the alcohol rehab facility where he currently resides.  He then states that the staff told him he would need to see an endocrinologist or he would not be able to stay there.  Patient was previously seen by  one of our endocrinology.  He was discharged from their service due to multiple missed appointments.  Contact information was given for alternative endocrinology service.  Patient's glucose in the ED was 147.  Vital signs are normal.  Patient is well-appearing.  I do not feel that he needs any other work-up at this time.  Patient was advised to continue reaching out to endocrinology services to set up follow-up appointment as needed.  Patient was discharged in good condition.  Final Clinical Impression(s) / ED Diagnoses Final diagnoses:  Type 2 diabetes mellitus with other specified complication, without long-term current use of insulin [facility])    Rx / DC Orders ED Discharge Orders     None        Godfrey Pick, MD 07/25/21 1914

## 2021-07-25 NOTE — Telephone Encounter (Signed)
Patient called. Says he can not go to [facility]. Would like a referral sent somewhere else. He can be reached at [phone]

## 2021-07-25 NOTE — ED Triage Notes (Signed)
Patient with concerns of shifting blood sugars. Highs and lows. Concerned about diet in recovery center. Also complains of insomnia. Some nausea. Some blurry vision when CBG >300.

## 2021-07-25 NOTE — Telephone Encounter (Signed)
Please advise on where else to refer.

## 2021-07-25 NOTE — ED Notes (Signed)
Pt discharged to home. Discharge instructions have been discussed with patient and/or family members. Pt verbally acknowledges understanding d/c instructions, and endorses comprehension to checkout at registration before leaving. Pt requested CBG check before departure.

## 2021-07-26 ENCOUNTER — Telehealth: Payer: Self-pay | Admitting: Family Medicine

## 2021-07-26 NOTE — Telephone Encounter (Signed)
Faxed order to [facility]. They will contact pt to schedule

## 2021-07-26 NOTE — Telephone Encounter (Signed)
Please see the ED note from yesterday and advise.

## 2021-07-26 NOTE — Telephone Encounter (Signed)
Patient's wife Larene Beach she asked if patient can be referred to endocrinologist as soon as possible. Patient went to the [facility]    The number to contact Larene Beach is [phone]

## 2021-07-27 ENCOUNTER — Other Ambulatory Visit: Payer: Self-pay | Admitting: Family Medicine

## 2021-07-27 DIAGNOSIS — E11649 Type 2 diabetes mellitus with hypoglycemia without coma: Secondary | ICD-10-CM

## 2021-07-27 NOTE — Telephone Encounter (Signed)
Tried calling Carlos [name] to let her know the referral has been placed. No answer. Will try again later.

## 2021-07-27 NOTE — Telephone Encounter (Signed)
2nd call attempt -- no answer.

## 2021-08-07 DIAGNOSIS — F1020 Alcohol dependence, uncomplicated: Secondary | ICD-10-CM | POA: Diagnosis not present

## 2021-08-07 DIAGNOSIS — Z7689 Persons encountering health services in other specified circumstances: Secondary | ICD-10-CM | POA: Diagnosis not present

## 2021-08-07 DIAGNOSIS — R5383 Other fatigue: Secondary | ICD-10-CM | POA: Diagnosis not present

## 2021-08-07 DIAGNOSIS — E119 Type 2 diabetes mellitus without complications: Secondary | ICD-10-CM | POA: Diagnosis not present

## 2021-08-14 DIAGNOSIS — E119 Type 2 diabetes mellitus without complications: Secondary | ICD-10-CM | POA: Diagnosis not present

## 2021-08-14 DIAGNOSIS — Z681 Body mass index (BMI) 19 or less, adult: Secondary | ICD-10-CM | POA: Diagnosis not present

## 2021-08-14 DIAGNOSIS — Z13220 Encounter for screening for lipoid disorders: Secondary | ICD-10-CM | POA: Diagnosis not present

## 2021-08-14 DIAGNOSIS — F1020 Alcohol dependence, uncomplicated: Secondary | ICD-10-CM | POA: Diagnosis not present

## 2021-08-29 ENCOUNTER — Encounter (HOSPITAL_COMMUNITY): Payer: Self-pay | Admitting: General Surgery

## 2021-08-29 ENCOUNTER — Emergency Department (HOSPITAL_COMMUNITY): Payer: BC Managed Care – PPO

## 2021-08-29 ENCOUNTER — Inpatient Hospital Stay (HOSPITAL_COMMUNITY): Payer: BC Managed Care – PPO

## 2021-08-29 ENCOUNTER — Inpatient Hospital Stay (HOSPITAL_COMMUNITY)
Admission: EM | Admit: 2021-08-29 | Discharge: 2021-09-13 | DRG: 957 | Disposition: A | Discharge status: Rehabilitation Facility | Payer: BC Managed Care – PPO | Attending: General Surgery | Admitting: General Surgery

## 2021-08-29 DIAGNOSIS — T402X5A Adverse effect of other opioids, initial encounter: Secondary | ICD-10-CM | POA: Diagnosis not present

## 2021-08-29 DIAGNOSIS — F4322 Adjustment disorder with anxiety: Secondary | ICD-10-CM | POA: Diagnosis not present

## 2021-08-29 DIAGNOSIS — S22051A Stable burst fracture of T5-T6 vertebra, initial encounter for closed fracture: Secondary | ICD-10-CM | POA: Diagnosis not present

## 2021-08-29 DIAGNOSIS — Z9114 Patient's other noncompliance with medication regimen: Secondary | ICD-10-CM

## 2021-08-29 DIAGNOSIS — Z981 Arthrodesis status: Secondary | ICD-10-CM | POA: Diagnosis not present

## 2021-08-29 DIAGNOSIS — G8250 Quadriplegia, unspecified: Secondary | ICD-10-CM | POA: Diagnosis not present

## 2021-08-29 DIAGNOSIS — F32A Depression, unspecified: Secondary | ICD-10-CM | POA: Diagnosis not present

## 2021-08-29 DIAGNOSIS — M4322 Fusion of spine, cervical region: Secondary | ICD-10-CM | POA: Diagnosis not present

## 2021-08-29 DIAGNOSIS — E871 Hypo-osmolality and hyponatremia: Secondary | ICD-10-CM | POA: Diagnosis not present

## 2021-08-29 DIAGNOSIS — I951 Orthostatic hypotension: Secondary | ICD-10-CM | POA: Diagnosis not present

## 2021-08-29 DIAGNOSIS — G8254 Quadriplegia, C5-C7 incomplete: Secondary | ICD-10-CM | POA: Diagnosis not present

## 2021-08-29 DIAGNOSIS — I959 Hypotension, unspecified: Secondary | ICD-10-CM | POA: Diagnosis not present

## 2021-08-29 DIAGNOSIS — Z5329 Procedure and treatment not carried out because of patient's decision for other reasons: Secondary | ICD-10-CM | POA: Diagnosis not present

## 2021-08-29 DIAGNOSIS — S12500A Unspecified displaced fracture of sixth cervical vertebra, initial encounter for closed fracture: Secondary | ICD-10-CM | POA: Diagnosis present

## 2021-08-29 DIAGNOSIS — S42112A Displaced fracture of body of scapula, left shoulder, initial encounter for closed fracture: Secondary | ICD-10-CM | POA: Diagnosis present

## 2021-08-29 DIAGNOSIS — R0781 Pleurodynia: Secondary | ICD-10-CM | POA: Diagnosis not present

## 2021-08-29 DIAGNOSIS — Z4682 Encounter for fitting and adjustment of non-vascular catheter: Secondary | ICD-10-CM | POA: Diagnosis not present

## 2021-08-29 DIAGNOSIS — R339 Retention of urine, unspecified: Secondary | ICD-10-CM | POA: Diagnosis not present

## 2021-08-29 DIAGNOSIS — S270XXA Traumatic pneumothorax, initial encounter: Principal | ICD-10-CM | POA: Diagnosis present

## 2021-08-29 DIAGNOSIS — K592 Neurogenic bowel, not elsewhere classified: Secondary | ICD-10-CM | POA: Diagnosis not present

## 2021-08-29 DIAGNOSIS — S42113A Displaced fracture of body of scapula, unspecified shoulder, initial encounter for closed fracture: Secondary | ICD-10-CM | POA: Diagnosis present

## 2021-08-29 DIAGNOSIS — T07XXXA Unspecified multiple injuries, initial encounter: Secondary | ICD-10-CM | POA: Diagnosis not present

## 2021-08-29 DIAGNOSIS — S225XXA Flail chest, initial encounter for closed fracture: Secondary | ICD-10-CM | POA: Diagnosis present

## 2021-08-29 DIAGNOSIS — Z8616 Personal history of COVID-19: Secondary | ICD-10-CM

## 2021-08-29 DIAGNOSIS — E43 Unspecified severe protein-calorie malnutrition: Secondary | ICD-10-CM | POA: Diagnosis not present

## 2021-08-29 DIAGNOSIS — F10920 Alcohol use, unspecified with intoxication, uncomplicated: Secondary | ICD-10-CM

## 2021-08-29 DIAGNOSIS — S022XXA Fracture of nasal bones, initial encounter for closed fracture: Secondary | ICD-10-CM | POA: Diagnosis not present

## 2021-08-29 DIAGNOSIS — E119 Type 2 diabetes mellitus without complications: Secondary | ICD-10-CM | POA: Diagnosis not present

## 2021-08-29 DIAGNOSIS — M40294 Other kyphosis, thoracic region: Secondary | ICD-10-CM | POA: Diagnosis present

## 2021-08-29 DIAGNOSIS — Z681 Body mass index (BMI) 19 or less, adult: Secondary | ICD-10-CM | POA: Diagnosis not present

## 2021-08-29 DIAGNOSIS — Z8739 Personal history of other diseases of the musculoskeletal system and connective tissue: Secondary | ICD-10-CM | POA: Diagnosis not present

## 2021-08-29 DIAGNOSIS — Z23 Encounter for immunization: Secondary | ICD-10-CM | POA: Diagnosis not present

## 2021-08-29 DIAGNOSIS — S2242XA Multiple fractures of ribs, left side, initial encounter for closed fracture: Secondary | ICD-10-CM

## 2021-08-29 DIAGNOSIS — R270 Ataxia, unspecified: Secondary | ICD-10-CM | POA: Diagnosis not present

## 2021-08-29 DIAGNOSIS — D649 Anemia, unspecified: Secondary | ICD-10-CM | POA: Diagnosis not present

## 2021-08-29 DIAGNOSIS — R202 Paresthesia of skin: Secondary | ICD-10-CM | POA: Diagnosis not present

## 2021-08-29 DIAGNOSIS — S42032A Displaced fracture of lateral end of left clavicle, initial encounter for closed fracture: Secondary | ICD-10-CM | POA: Diagnosis present

## 2021-08-29 DIAGNOSIS — E1165 Type 2 diabetes mellitus with hyperglycemia: Secondary | ICD-10-CM | POA: Diagnosis present

## 2021-08-29 DIAGNOSIS — S12590A Other displaced fracture of sixth cervical vertebra, initial encounter for closed fracture: Secondary | ICD-10-CM | POA: Diagnosis not present

## 2021-08-29 DIAGNOSIS — R0902 Hypoxemia: Secondary | ICD-10-CM | POA: Diagnosis not present

## 2021-08-29 DIAGNOSIS — R64 Cachexia: Secondary | ICD-10-CM | POA: Diagnosis not present

## 2021-08-29 DIAGNOSIS — M7989 Other specified soft tissue disorders: Secondary | ICD-10-CM | POA: Diagnosis not present

## 2021-08-29 DIAGNOSIS — D62 Acute posthemorrhagic anemia: Secondary | ICD-10-CM | POA: Diagnosis not present

## 2021-08-29 DIAGNOSIS — S27321A Contusion of lung, unilateral, initial encounter: Secondary | ICD-10-CM | POA: Diagnosis present

## 2021-08-29 DIAGNOSIS — K5900 Constipation, unspecified: Secondary | ICD-10-CM | POA: Diagnosis not present

## 2021-08-29 DIAGNOSIS — S14126A Central cord syndrome at C6 level of cervical spinal cord, initial encounter: Secondary | ICD-10-CM | POA: Diagnosis present

## 2021-08-29 DIAGNOSIS — S22050A Wedge compression fracture of T5-T6 vertebra, initial encounter for closed fracture: Secondary | ICD-10-CM | POA: Diagnosis not present

## 2021-08-29 DIAGNOSIS — S12400A Unspecified displaced fracture of fifth cervical vertebra, initial encounter for closed fracture: Secondary | ICD-10-CM | POA: Diagnosis not present

## 2021-08-29 DIAGNOSIS — S270XXD Traumatic pneumothorax, subsequent encounter: Secondary | ICD-10-CM | POA: Diagnosis not present

## 2021-08-29 DIAGNOSIS — Z7984 Long term (current) use of oral hypoglycemic drugs: Secondary | ICD-10-CM

## 2021-08-29 DIAGNOSIS — Z419 Encounter for procedure for purposes other than remedying health state, unspecified: Secondary | ICD-10-CM

## 2021-08-29 DIAGNOSIS — R739 Hyperglycemia, unspecified: Secondary | ICD-10-CM | POA: Diagnosis not present

## 2021-08-29 DIAGNOSIS — Y92239 Unspecified place in hospital as the place of occurrence of the external cause: Secondary | ICD-10-CM | POA: Diagnosis not present

## 2021-08-29 DIAGNOSIS — Z041 Encounter for examination and observation following transport accident: Secondary | ICD-10-CM | POA: Diagnosis not present

## 2021-08-29 DIAGNOSIS — S14105A Unspecified injury at C5 level of cervical spinal cord, initial encounter: Secondary | ICD-10-CM | POA: Diagnosis not present

## 2021-08-29 DIAGNOSIS — S2232XA Fracture of one rib, left side, initial encounter for closed fracture: Secondary | ICD-10-CM | POA: Diagnosis not present

## 2021-08-29 DIAGNOSIS — J939 Pneumothorax, unspecified: Secondary | ICD-10-CM

## 2021-08-29 DIAGNOSIS — S42102A Fracture of unspecified part of scapula, left shoulder, initial encounter for closed fracture: Secondary | ICD-10-CM | POA: Diagnosis not present

## 2021-08-29 DIAGNOSIS — F329 Major depressive disorder, single episode, unspecified: Secondary | ICD-10-CM | POA: Diagnosis not present

## 2021-08-29 DIAGNOSIS — G709 Myoneural disorder, unspecified: Secondary | ICD-10-CM | POA: Diagnosis not present

## 2021-08-29 DIAGNOSIS — M4802 Spinal stenosis, cervical region: Secondary | ICD-10-CM | POA: Diagnosis not present

## 2021-08-29 DIAGNOSIS — L299 Pruritus, unspecified: Secondary | ICD-10-CM | POA: Diagnosis not present

## 2021-08-29 DIAGNOSIS — T1490XA Injury, unspecified, initial encounter: Secondary | ICD-10-CM | POA: Diagnosis not present

## 2021-08-29 DIAGNOSIS — J9690 Respiratory failure, unspecified, unspecified whether with hypoxia or hypercapnia: Secondary | ICD-10-CM | POA: Diagnosis not present

## 2021-08-29 DIAGNOSIS — J984 Other disorders of lung: Secondary | ICD-10-CM | POA: Diagnosis not present

## 2021-08-29 DIAGNOSIS — M50222 Other cervical disc displacement at C5-C6 level: Secondary | ICD-10-CM | POA: Diagnosis not present

## 2021-08-29 DIAGNOSIS — E785 Hyperlipidemia, unspecified: Secondary | ICD-10-CM | POA: Diagnosis present

## 2021-08-29 DIAGNOSIS — S42032D Displaced fracture of lateral end of left clavicle, subsequent encounter for fracture with routine healing: Secondary | ICD-10-CM | POA: Diagnosis not present

## 2021-08-29 DIAGNOSIS — R52 Pain, unspecified: Secondary | ICD-10-CM

## 2021-08-29 DIAGNOSIS — S301XXA Contusion of abdominal wall, initial encounter: Secondary | ICD-10-CM | POA: Diagnosis not present

## 2021-08-29 DIAGNOSIS — Z79899 Other long term (current) drug therapy: Secondary | ICD-10-CM

## 2021-08-29 DIAGNOSIS — M2578 Osteophyte, vertebrae: Secondary | ICD-10-CM | POA: Diagnosis present

## 2021-08-29 DIAGNOSIS — S22059A Unspecified fracture of T5-T6 vertebra, initial encounter for closed fracture: Secondary | ICD-10-CM | POA: Diagnosis not present

## 2021-08-29 DIAGNOSIS — S1093XA Contusion of unspecified part of neck, initial encounter: Secondary | ICD-10-CM | POA: Diagnosis not present

## 2021-08-29 DIAGNOSIS — S42002A Fracture of unspecified part of left clavicle, initial encounter for closed fracture: Secondary | ICD-10-CM | POA: Diagnosis not present

## 2021-08-29 DIAGNOSIS — S14126D Central cord syndrome at C6 level of cervical spinal cord, subsequent encounter: Secondary | ICD-10-CM | POA: Diagnosis not present

## 2021-08-29 HISTORY — DX: Type 2 diabetes mellitus without complications: E11.9

## 2021-08-29 LAB — COMPREHENSIVE METABOLIC PANEL
ALT: 19 U/L (ref 0–44)
AST: 37 U/L (ref 15–41)
Albumin: 3.4 g/dL — ABNORMAL LOW (ref 3.5–5.0)
Alkaline Phosphatase: 73 U/L (ref 38–126)
Anion gap: 17 — ABNORMAL HIGH (ref 5–15)
BUN: 10 mg/dL (ref 6–20)
CO2: 21 mmol/L — ABNORMAL LOW (ref 22–32)
Calcium: 9 mg/dL (ref 8.9–10.3)
Chloride: 98 mmol/L (ref 98–111)
Creatinine, Ser: 0.86 mg/dL (ref 0.61–1.24)
GFR, Estimated: >60 mL/min (ref >60)
Glucose, Bld: 285 mg/dL — ABNORMAL HIGH (ref 70–99)
Potassium: 3.8 mmol/L (ref 3.5–5.1)
Sodium: 136 mmol/L (ref 135–145)
Total Bilirubin: 0.5 mg/dL (ref 0.3–1.2)
Total Protein: 6.8 g/dL (ref 6.5–8.1)

## 2021-08-29 LAB — CBC
HCT: 38.5 % — ABNORMAL LOW (ref 39.0–52.0)
Hemoglobin: 13.2 g/dL (ref 13.0–17.0)
MCH: 31.8 pg (ref 26.0–34.0)
MCHC: 34.3 g/dL (ref 30.0–36.0)
MCV: 92.8 fL (ref 80.0–100.0)
Platelets: 339 10*3/uL (ref 150–400)
RBC: 4.15 MIL/uL — ABNORMAL LOW (ref 4.22–5.81)
RDW: 11.9 % (ref 11.5–15.5)
WBC: 5.7 10*3/uL (ref 4.0–10.5)
nRBC: 0 % (ref 0.0–0.2)

## 2021-08-29 LAB — RESP PANEL BY RT-PCR (FLU A&B, COVID) ARPGX2
Influenza A by PCR: NEGATIVE
Influenza B by PCR: NEGATIVE
SARS Coronavirus 2 by RT PCR: POSITIVE — AB

## 2021-08-29 LAB — I-STAT CHEM 8, ED
BUN: 10 mg/dL (ref 6–20)
Calcium, Ion: 1.08 mmol/L — ABNORMAL LOW (ref 1.15–1.40)
Chloride: 99 mmol/L (ref 98–111)
Creatinine, Ser: 1 mg/dL (ref 0.61–1.24)
Glucose, Bld: 286 mg/dL — ABNORMAL HIGH (ref 70–99)
HCT: 38 % — ABNORMAL LOW (ref 39.0–52.0)
Hemoglobin: 12.9 g/dL — ABNORMAL LOW (ref 13.0–17.0)
Potassium: 3.8 mmol/L (ref 3.5–5.1)
Sodium: 136 mmol/L (ref 135–145)
TCO2: 24 mmol/L (ref 22–32)

## 2021-08-29 LAB — PROTIME-INR
INR: 1 (ref 0.8–1.2)
Prothrombin Time: 12.7 seconds (ref 11.4–15.2)

## 2021-08-29 LAB — LACTIC ACID, PLASMA: Lactic Acid, Venous: 3.8 mmol/L (ref 0.5–1.9)

## 2021-08-29 LAB — GLUCOSE, CAPILLARY: Glucose-Capillary: 157 mg/dL — ABNORMAL HIGH (ref 70–99)

## 2021-08-29 LAB — SAMPLE TO BLOOD BANK

## 2021-08-29 LAB — ETHANOL: Alcohol, Ethyl (B): 174 mg/dL — ABNORMAL HIGH (ref <10)
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
## 2021-08-29 MED ORDER — SODIUM CHLORIDE 0.9 % IV SOLN: INTRAVENOUS | Status: DC

## 2021-08-29 MED ORDER — FENTANYL CITRATE PF 50 MCG/ML IJ SOSY
50.0000 ug | PREFILLED_SYRINGE | Freq: Once | INTRAMUSCULAR | Status: AC
  Administered 2021-08-29: 50 ug via INTRAVENOUS

## 2021-08-29 MED ORDER — FENTANYL CITRATE (PF) 100 MCG/2ML IJ SOLN
INTRAMUSCULAR | Status: AC
  Administered 2021-08-29: 50 ug
  Filled 2021-08-29: qty 2

## 2021-08-29 MED ORDER — HYDROMORPHONE HCL 1 MG/ML IJ SOLN
1.0000 mg | INTRAMUSCULAR | Status: DC | PRN
Start: 2021-08-29 — End: 2021-08-31
  Administered 2021-08-29 – 2021-08-31 (×11): 1 mg via INTRAVENOUS
  Filled 2021-08-29 (×11): qty 1

## 2021-08-29 MED ORDER — INSULIN ASPART 100 UNIT/ML IJ SOLN
0.0000 [IU] | Freq: Every day | INTRAMUSCULAR | Status: DC
  Administered 2021-09-03 – 2021-09-11 (×4): 2 [IU] via SUBCUTANEOUS

## 2021-08-29 MED ORDER — LORAZEPAM 2 MG/ML IJ SOLN
1.0000 mg | INTRAMUSCULAR | Status: AC | PRN
  Administered 2021-08-30 – 2021-08-31 (×2): 2 mg via INTRAVENOUS
  Filled 2021-08-29 (×2): qty 1

## 2021-08-29 MED ORDER — LORAZEPAM 1 MG PO TABS: 1.0000 mg | ORAL_TABLET | ORAL | Status: AC | PRN

## 2021-08-29 MED ORDER — SODIUM CHLORIDE 0.9 % IV BOLUS
1000.0000 mL | Freq: Once | INTRAVENOUS | Status: AC
  Administered 2021-08-29: 1000 mL via INTRAVENOUS

## 2021-08-29 MED ORDER — THIAMINE HCL 100 MG/ML IJ SOLN
100.0000 mg | Freq: Every day | INTRAMUSCULAR | Status: DC
  Administered 2021-08-30 – 2021-09-01 (×2): 100 mg via INTRAVENOUS
  Filled 2021-08-29 (×2): qty 2

## 2021-08-29 MED ORDER — FENTANYL CITRATE (PF) 100 MCG/2ML IJ SOLN
INTRAMUSCULAR | Status: AC
  Filled 2021-08-29: qty 2

## 2021-08-29 MED ORDER — METHOCARBAMOL 1000 MG/10ML IJ SOLN
500.0000 mg | Freq: Three times a day (TID) | INTRAVENOUS | Status: DC
  Administered 2021-08-29 – 2021-08-31 (×4): 500 mg via INTRAVENOUS
  Filled 2021-08-29 (×3): qty 5
  Filled 2021-08-29: qty 500
  Filled 2021-08-29 (×2): qty 5

## 2021-08-29 MED ORDER — ALBUMIN HUMAN 5 % IV SOLN
25.0000 g | Freq: Once | INTRAVENOUS | Status: AC
  Administered 2021-08-29: 25 g via INTRAVENOUS
  Filled 2021-08-29 (×4): qty 500

## 2021-08-29 MED ORDER — ACETAMINOPHEN 10 MG/ML IV SOLN
1000.0000 mg | Freq: Four times a day (QID) | INTRAVENOUS | Status: AC
  Administered 2021-08-29 – 2021-08-30 (×3): 1000 mg via INTRAVENOUS
  Filled 2021-08-29 (×3): qty 100

## 2021-08-29 MED ORDER — ADULT MULTIVITAMIN W/MINERALS CH
1.0000 | ORAL_TABLET | Freq: Every day | ORAL | Status: DC
  Administered 2021-08-31 – 2021-09-13 (×14): 1 via ORAL
  Filled 2021-08-29 (×13): qty 1

## 2021-08-29 MED ORDER — INSULIN ASPART 100 UNIT/ML IJ SOLN
0.0000 [IU] | Freq: Three times a day (TID) | INTRAMUSCULAR | Status: DC
  Administered 2021-09-03 – 2021-09-04 (×5): 3 [IU] via SUBCUTANEOUS
  Administered 2021-09-05: 2 [IU] via SUBCUTANEOUS
  Administered 2021-09-05: 5 [IU] via SUBCUTANEOUS
  Administered 2021-09-05: 3 [IU] via SUBCUTANEOUS
  Administered 2021-09-06 (×2): 2 [IU] via SUBCUTANEOUS
  Administered 2021-09-07: 3 [IU] via SUBCUTANEOUS
  Administered 2021-09-07: 2 [IU] via SUBCUTANEOUS
  Administered 2021-09-07: 3 [IU] via SUBCUTANEOUS
  Administered 2021-09-08: 5 [IU] via SUBCUTANEOUS
  Administered 2021-09-08: 3 [IU] via SUBCUTANEOUS
  Administered 2021-09-09: 2 [IU] via SUBCUTANEOUS
  Administered 2021-09-09: 5 [IU] via SUBCUTANEOUS
  Administered 2021-09-09 – 2021-09-10 (×2): 3 [IU] via SUBCUTANEOUS
  Administered 2021-09-10 (×2): 2 [IU] via SUBCUTANEOUS
  Administered 2021-09-11: 5 [IU] via SUBCUTANEOUS
  Administered 2021-09-11 (×2): 3 [IU] via SUBCUTANEOUS
  Administered 2021-09-12: 2 [IU] via SUBCUTANEOUS
  Administered 2021-09-12 – 2021-09-13 (×4): 3 [IU] via SUBCUTANEOUS

## 2021-08-29 MED ORDER — FOLIC ACID 1 MG PO TABS
1.0000 mg | ORAL_TABLET | Freq: Every day | ORAL | Status: DC
  Administered 2021-08-31 – 2021-09-13 (×13): 1 mg via ORAL
  Filled 2021-08-29 (×14): qty 1

## 2021-08-29 MED ORDER — IOHEXOL 350 MG/ML SOLN
80.0000 mL | Freq: Once | INTRAVENOUS | Status: AC | PRN
  Administered 2021-08-29: 80 mL via INTRAVENOUS

## 2021-08-29 MED ORDER — FENTANYL CITRATE PF 50 MCG/ML IJ SOSY: PREFILLED_SYRINGE | INTRAMUSCULAR | Status: AC | PRN

## 2021-08-29 MED ORDER — ONDANSETRON HCL 4 MG/2ML IJ SOLN
4.0000 mg | Freq: Four times a day (QID) | INTRAMUSCULAR | Status: DC | PRN
  Administered 2021-08-29 – 2021-08-30 (×2): 4 mg via INTRAVENOUS
  Filled 2021-08-29 (×2): qty 2

## 2021-08-29 MED ORDER — TETANUS-DIPHTH-ACELL PERTUSSIS 5-2.5-18.5 LF-MCG/0.5 IM SUSY
0.5000 mL | PREFILLED_SYRINGE | Freq: Once | INTRAMUSCULAR | Status: AC
  Administered 2021-08-29: 0.5 mL via INTRAMUSCULAR
  Filled 2021-08-29: qty 0.5

## 2021-08-29 MED ORDER — ONDANSETRON 4 MG PO TBDP
4.0000 mg | ORAL_TABLET | Freq: Four times a day (QID) | ORAL | Status: DC | PRN
Start: 2021-08-29 — End: 2021-08-30

## 2021-08-29 MED ORDER — THIAMINE HCL 100 MG PO TABS
100.0000 mg | ORAL_TABLET | Freq: Every day | ORAL | Status: DC
  Administered 2021-08-31 – 2021-09-13 (×13): 100 mg via ORAL
  Filled 2021-08-29 (×14): qty 1
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
## 2021-08-29 NOTE — ED Notes (Signed)
C-Collar replaced with [place] J.
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
## 2021-08-29 NOTE — ED Notes (Signed)
Trauma PA  paged Neuro Surgery and Orthopedic surgery

## 2021-08-29 NOTE — ED Notes (Signed)
XR at bedside for chest tube placement

## 2021-08-29 NOTE — ED Notes (Signed)
Delay to CT d/t chest tube placement

## 2021-08-29 NOTE — Progress Notes (Signed)
Patient stated he tested positive for Covid 2 weeks ago.  Is now completely asymptomatic and has been for a week.  Patient tested positive for Covid today in ED.  Spoke with dr Donne Hazel who stated it's ok to not put him on precautions.

## 2021-08-29 NOTE — Progress Notes (Signed)
Attempted to send for pt to come down for MRI. Per RN, pt needs to see Neurology before MRIs. Will try back at later time.

## 2021-08-29 NOTE — ED Notes (Signed)
Patient transported to CT 
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
## 2021-08-29 NOTE — ED Notes (Signed)
Dr. Barry Dienes at bedside, chest tube placed set at 20 suction
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
## 2021-08-29 NOTE — Progress Notes (Signed)
Patient ID: Carlos Frieze., male   DOB: [birth year], 50 y.o.   MRN: [id number] Patient tested for covid positive but states he tested pos two weeks ago. He was not symptomatic. I dont have other pos test result.  Per infection control he can be off of precautions

## 2021-08-29 NOTE — ED Notes (Signed)
Pt SpO2 88@ on 2 liters. Increased to 4 liters with relief
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
## 2021-08-29 NOTE — Procedures (Signed)
Chest Tube Insertion Procedure Note  Indications:  Clinically significant Pneumothorax  Pre-operative Diagnosis: Left Pneumothorax  Post-operative Diagnosis: left Pneumothorax  Procedure Details  Time-out was performed verifying correct patient, procedure, site, and laterality. After sterile skin prep, using standard technique, a 14 French tube was placed in the left lateral chest wall without complications. Patient tolerated well with some pain.  Findings: Air evacuated when chest tube was inserted  Estimated Blood Loss:  Minimal         Specimens:  None              Complications:  None; patient tolerated the procedure well.         Condition: stable  Winferd Humphrey, [facility] 08/29/2021, 4:32 PM Please see Amion for pager number during day hours 7:00am-4:30pm
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
## 2021-08-30 ENCOUNTER — Inpatient Hospital Stay (HOSPITAL_COMMUNITY): Payer: BC Managed Care – PPO | Admitting: Certified Registered Nurse Anesthetist

## 2021-08-30 ENCOUNTER — Inpatient Hospital Stay (HOSPITAL_COMMUNITY): Payer: BC Managed Care – PPO

## 2021-08-30 ENCOUNTER — Encounter (HOSPITAL_COMMUNITY): Payer: Self-pay

## 2021-08-30 ENCOUNTER — Other Ambulatory Visit: Payer: Self-pay | Admitting: Neurosurgery

## 2021-08-30 ENCOUNTER — Encounter (HOSPITAL_COMMUNITY): Admission: EM | Disposition: A | Discharge status: Rehabilitation Facility | Payer: Self-pay | Source: Home / Self Care

## 2021-08-30 HISTORY — PX: ANTERIOR CERVICAL DECOMP/DISCECTOMY FUSION: SHX1161

## 2021-08-30 LAB — GLUCOSE, CAPILLARY
Glucose-Capillary: 124 mg/dL — ABNORMAL HIGH (ref 70–99)
Glucose-Capillary: 102 mg/dL — ABNORMAL HIGH (ref 70–99)
Glucose-Capillary: 112 mg/dL — ABNORMAL HIGH (ref 70–99)
Glucose-Capillary: 143 mg/dL — ABNORMAL HIGH (ref 70–99)
Glucose-Capillary: 159 mg/dL — ABNORMAL HIGH (ref 70–99)

## 2021-08-30 LAB — TYPE AND SCREEN
ABO/RH(D): B POS
Antibody Screen: NEGATIVE

## 2021-08-30 LAB — SURGICAL PCR SCREEN
MRSA, PCR: NEGATIVE
Staphylococcus aureus: POSITIVE — AB

## 2021-08-30 LAB — ABO/RH: ABO/RH(D): B POS

## 2021-08-30 LAB — HEMOGLOBIN A1C
Hgb A1c MFr Bld: 8.4 % — ABNORMAL HIGH (ref 4.8–5.6)
Mean Plasma Glucose: 194.38 mg/dL
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
## 2021-08-30 IMAGING — DX DG CHEST 1V PORT
1 series · 1 of 1 positions shown · non-contrast
Comparison: [DATE]

CLINICAL DATA: Follow-up left pneumothorax

EXAM:
PORTABLE CHEST 1 VIEW

[chest ap]
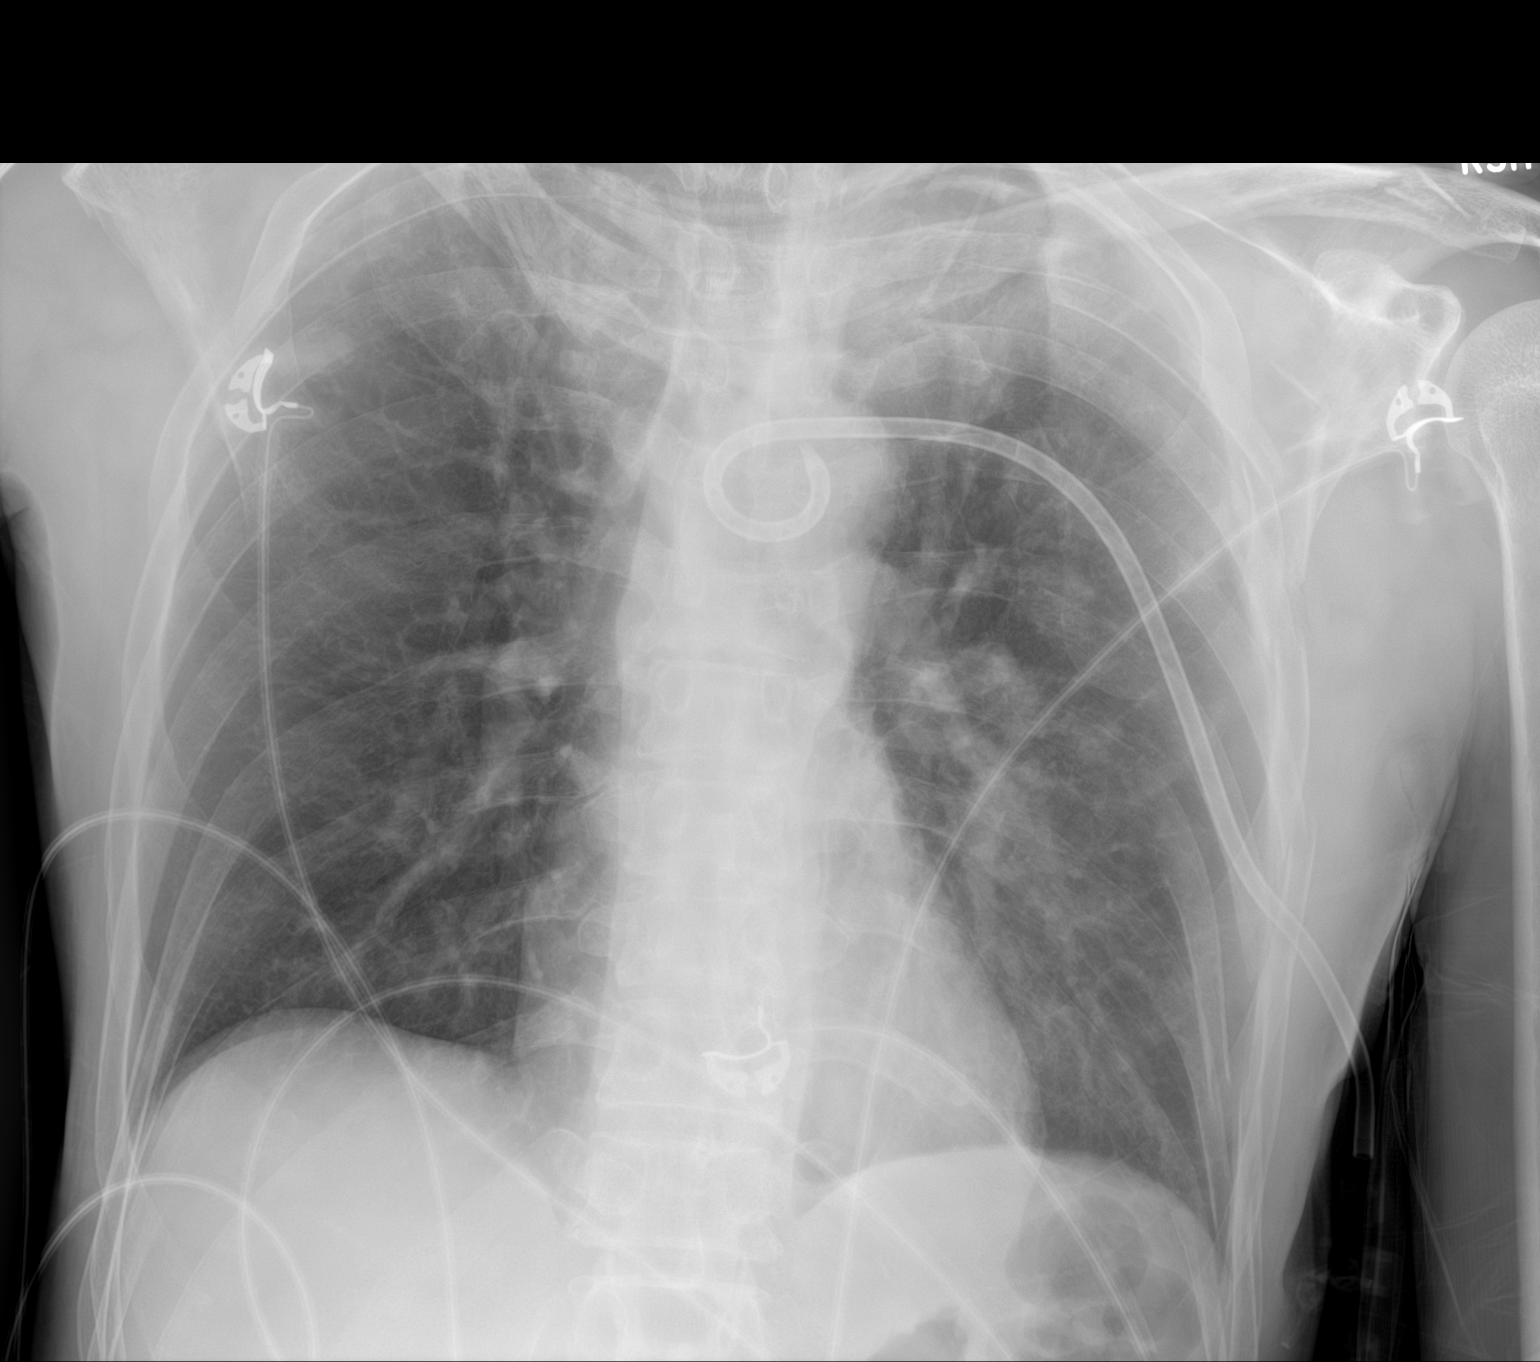

[1 of 1 positions shown; findings below may reference images not displayed]

FINDINGS: Left chest tube remains in place. Small residual left apical
pneumothorax. Patchy scarring in the upper lobes. Otherwise no
confluent opacities or effusions. Heart is normal size. Multiple
left rib fractures noted.
IMPRESSION: Stable left chest tube position with small left apical pneumothorax.
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
## 2021-08-30 SURGERY — ANTERIOR CERVICAL DECOMPRESSION/DISCECTOMY FUSION 1 LEVEL
Anesthesia: General | Site: Neck

## 2021-08-30 MED ORDER — FENTANYL CITRATE (PF) 250 MCG/5ML IJ SOLN
INTRAMUSCULAR | Status: AC
  Filled 2021-08-30: qty 5

## 2021-08-30 MED ORDER — LACTATED RINGERS IV SOLN: INTRAVENOUS | Status: DC

## 2021-08-30 MED ORDER — SUCCINYLCHOLINE CHLORIDE 200 MG/10ML IV SOSY
PREFILLED_SYRINGE | INTRAVENOUS | Status: AC
  Filled 2021-08-30: qty 10

## 2021-08-30 MED ORDER — ONDANSETRON HCL 4 MG/2ML IJ SOLN
INTRAMUSCULAR | Status: DC | PRN
  Administered 2021-08-30: 4 mg via INTRAVENOUS

## 2021-08-30 MED ORDER — PHENYLEPHRINE HCL (PRESSORS) 10 MG/ML IV SOLN
INTRAVENOUS | Status: AC
  Filled 2021-08-30: qty 2

## 2021-08-30 MED ORDER — SODIUM CHLORIDE 0.9% FLUSH
3.0000 mL | Freq: Two times a day (BID) | INTRAVENOUS | Status: DC
  Administered 2021-08-30 – 2021-09-11 (×20): 3 mL via INTRAVENOUS

## 2021-08-30 MED ORDER — PROPOFOL 10 MG/ML IV BOLUS
INTRAVENOUS | Status: DC | PRN
  Administered 2021-08-30: 20 mg via INTRAVENOUS
  Administered 2021-08-30 (×2): 10 mg via INTRAVENOUS
  Administered 2021-08-30: 20 mg via INTRAVENOUS
  Administered 2021-08-30: 140 mg via INTRAVENOUS

## 2021-08-30 MED ORDER — LIDOCAINE-EPINEPHRINE 2 %-1:100000 IJ SOLN
INTRAMUSCULAR | Status: DC | PRN
  Administered 2021-08-30: 4 mL

## 2021-08-30 MED ORDER — HYDROMORPHONE HCL 1 MG/ML IJ SOLN
INTRAMUSCULAR | Status: AC
  Filled 2021-08-30: qty 1

## 2021-08-30 MED ORDER — PHENYLEPHRINE 40 MCG/ML (10ML) SYRINGE FOR IV PUSH (FOR BLOOD PRESSURE SUPPORT)
PREFILLED_SYRINGE | INTRAVENOUS | Status: DC | PRN
  Administered 2021-08-30: 80 ug via INTRAVENOUS
  Administered 2021-08-30: 40 ug via INTRAVENOUS
  Administered 2021-08-30: 120 ug via INTRAVENOUS

## 2021-08-30 MED ORDER — HYDROMORPHONE HCL 1 MG/ML IJ SOLN
0.2500 mg | INTRAMUSCULAR | Status: DC | PRN
  Administered 2021-08-30: 0.5 mg via INTRAVENOUS

## 2021-08-30 MED ORDER — ONDANSETRON HCL 4 MG/2ML IJ SOLN
INTRAMUSCULAR | Status: AC
  Filled 2021-08-30: qty 2

## 2021-08-30 MED ORDER — CHLORHEXIDINE GLUCONATE 0.12 % MT SOLN
OROMUCOSAL | Status: AC
  Administered 2021-08-30: 15 mL via OROMUCOSAL
  Filled 2021-08-30: qty 15

## 2021-08-30 MED ORDER — MIDAZOLAM HCL 5 MG/5ML IJ SOLN
INTRAMUSCULAR | Status: DC | PRN
Start: 2021-08-30 — End: 2021-08-30
  Administered 2021-08-30: 2 mg via INTRAVENOUS

## 2021-08-30 MED ORDER — ACETAMINOPHEN 650 MG RE SUPP: 650.0000 mg | RECTAL | Status: DC | PRN

## 2021-08-30 MED ORDER — DEXAMETHASONE SODIUM PHOSPHATE 10 MG/ML IJ SOLN
INTRAMUSCULAR | Status: DC | PRN
  Administered 2021-08-30: 10 mg via INTRAVENOUS

## 2021-08-30 MED ORDER — MENTHOL 3 MG MT LOZG
1.0000 | LOZENGE | OROMUCOSAL | Status: DC | PRN
  Filled 2021-08-30 (×2): qty 9

## 2021-08-30 MED ORDER — FENTANYL CITRATE (PF) 250 MCG/5ML IJ SOLN
INTRAMUSCULAR | Status: DC | PRN
  Administered 2021-08-30: 100 ug via INTRAVENOUS
  Administered 2021-08-30: 25 ug via INTRAVENOUS
  Administered 2021-08-30 (×4): 50 ug via INTRAVENOUS
  Administered 2021-08-30: 25 ug via INTRAVENOUS

## 2021-08-30 MED ORDER — SODIUM CHLORIDE 0.9 % IV SOLN: 250.0000 mL | INTRAVENOUS | Status: DC

## 2021-08-30 MED ORDER — THROMBIN 5000 UNITS EX SOLR: OROMUCOSAL | Status: DC | PRN

## 2021-08-30 MED ORDER — SODIUM CHLORIDE 0.9% FLUSH: 3.0000 mL | INTRAVENOUS | Status: DC | PRN

## 2021-08-30 MED ORDER — ORAL CARE MOUTH RINSE: 15.0000 mL | Freq: Once | OROMUCOSAL | Status: AC

## 2021-08-30 MED ORDER — CEFAZOLIN SODIUM-DEXTROSE 2-3 GM-%(50ML) IV SOLR
INTRAVENOUS | Status: DC | PRN
  Administered 2021-08-30: 2 g via INTRAVENOUS

## 2021-08-30 MED ORDER — CHLORHEXIDINE GLUCONATE 0.12 % MT SOLN: 15.0000 mL | Freq: Once | OROMUCOSAL | Status: AC

## 2021-08-30 MED ORDER — ONDANSETRON HCL 4 MG/2ML IJ SOLN
4.0000 mg | Freq: Four times a day (QID) | INTRAMUSCULAR | Status: DC | PRN
  Administered 2021-08-31 – 2021-09-05 (×8): 4 mg via INTRAVENOUS
  Filled 2021-08-30 (×8): qty 2

## 2021-08-30 MED ORDER — FENTANYL CITRATE (PF) 100 MCG/2ML IJ SOLN: 100.0000 ug | Freq: Once | INTRAMUSCULAR | Status: AC

## 2021-08-30 MED ORDER — ACETAMINOPHEN 325 MG PO TABS
650.0000 mg | ORAL_TABLET | ORAL | Status: DC | PRN
Start: 2021-08-30 — End: 2021-08-31

## 2021-08-30 MED ORDER — ROCURONIUM BROMIDE 10 MG/ML (PF) SYRINGE
PREFILLED_SYRINGE | INTRAVENOUS | Status: DC | PRN
  Administered 2021-08-30: 80 mg via INTRAVENOUS
  Administered 2021-08-30: 20 mg via INTRAVENOUS
  Administered 2021-08-30: 10 mg via INTRAVENOUS

## 2021-08-30 MED ORDER — LIDOCAINE 2% (20 MG/ML) 5 ML SYRINGE
INTRAMUSCULAR | Status: AC
  Filled 2021-08-30: qty 15

## 2021-08-30 MED ORDER — SUGAMMADEX SODIUM 200 MG/2ML IV SOLN
INTRAVENOUS | Status: DC | PRN
  Administered 2021-08-30: 140 mg via INTRAVENOUS

## 2021-08-30 MED ORDER — LIDOCAINE-EPINEPHRINE 2 %-1:100000 IJ SOLN
INTRAMUSCULAR | Status: AC
  Filled 2021-08-30: qty 1

## 2021-08-30 MED ORDER — MIDAZOLAM HCL 2 MG/2ML IJ SOLN
INTRAMUSCULAR | Status: AC
  Filled 2021-08-30: qty 2

## 2021-08-30 MED ORDER — ONDANSETRON HCL 4 MG/2ML IJ SOLN: 4.0000 mg | Freq: Once | INTRAMUSCULAR | Status: DC | PRN

## 2021-08-30 MED ORDER — FENTANYL CITRATE (PF) 100 MCG/2ML IJ SOLN
INTRAMUSCULAR | Status: AC
  Administered 2021-08-30: 100 ug via INTRAVENOUS
  Filled 2021-08-30: qty 2

## 2021-08-30 MED ORDER — DEXAMETHASONE SODIUM PHOSPHATE 10 MG/ML IJ SOLN
INTRAMUSCULAR | Status: AC
  Filled 2021-08-30: qty 1

## 2021-08-30 MED ORDER — PHENYLEPHRINE HCL-NACL 20-0.9 MG/250ML-% IV SOLN
INTRAVENOUS | Status: DC | PRN
  Administered 2021-08-30: 20 ug/min via INTRAVENOUS

## 2021-08-30 MED ORDER — PROPOFOL 10 MG/ML IV BOLUS
INTRAVENOUS | Status: AC
  Filled 2021-08-30: qty 20

## 2021-08-30 MED ORDER — THROMBIN 5000 UNITS EX SOLR
CUTANEOUS | Status: AC
  Filled 2021-08-30: qty 5000

## 2021-08-30 MED ORDER — OXYCODONE HCL 5 MG PO TABS: 5.0000 mg | ORAL_TABLET | Freq: Once | ORAL | Status: DC | PRN

## 2021-08-30 MED ORDER — ENOXAPARIN SODIUM 40 MG/0.4ML IJ SOSY: 40.0000 mg | PREFILLED_SYRINGE | INTRAMUSCULAR | Status: DC

## 2021-08-30 MED ORDER — WHITE PETROLATUM EX OINT
TOPICAL_OINTMENT | CUTANEOUS | Status: AC
  Filled 2021-08-30: qty 28.35

## 2021-08-30 MED ORDER — LIDOCAINE 2% (20 MG/ML) 5 ML SYRINGE
INTRAMUSCULAR | Status: DC | PRN
  Administered 2021-08-30: 60 mg via INTRAVENOUS

## 2021-08-30 MED ORDER — ONDANSETRON HCL 4 MG PO TABS
4.0000 mg | ORAL_TABLET | Freq: Four times a day (QID) | ORAL | Status: DC | PRN
  Administered 2021-09-03 – 2021-09-12 (×2): 4 mg via ORAL
  Filled 2021-08-30 (×5): qty 1

## 2021-08-30 MED ORDER — OXYCODONE HCL 5 MG/5ML PO SOLN: 5.0000 mg | Freq: Once | ORAL | Status: DC | PRN

## 2021-08-30 MED ORDER — CEFAZOLIN SODIUM-DEXTROSE 2-4 GM/100ML-% IV SOLN
2.0000 g | Freq: Three times a day (TID) | INTRAVENOUS | Status: AC
  Administered 2021-08-31 (×2): 2 g via INTRAVENOUS
  Filled 2021-08-30 (×2): qty 100

## 2021-08-30 MED ORDER — PHENOL 1.4 % MT LIQD: 1.0000 | OROMUCOSAL | Status: DC | PRN

## 2021-08-30 MED ORDER — 0.9 % SODIUM CHLORIDE (POUR BTL) OPTIME
TOPICAL | Status: DC | PRN
  Administered 2021-08-30: 1000 mL

## 2021-08-30 MED ORDER — CHLORHEXIDINE GLUCONATE CLOTH 2 % EX PADS
6.0000 | MEDICATED_PAD | Freq: Every day | CUTANEOUS | Status: DC
  Administered 2021-08-30 – 2021-09-03 (×5): 6 via TOPICAL

## 2021-08-30 MED ORDER — GADOBUTROL 1 MMOL/ML IV SOLN
6.5000 mL | Freq: Once | INTRAVENOUS | Status: AC | PRN
  Administered 2021-08-30: 6.5 mL via INTRAVENOUS

## 2021-08-30 SURGICAL SUPPLY — 69 items
APL SKNCLS STERI-STRIP NONHPOA (GAUZE/BANDAGES/DRESSINGS) ×1
BAG COUNTER SPONGE SURGICOUNT (BAG) ×2 IMPLANT
BAG SPNG CNTER NS LX DISP (BAG) ×1
BAND INSRT 18 STRL LF DISP RB (MISCELLANEOUS) ×2
BAND RUBBER #18 3X1/16 STRL (MISCELLANEOUS) ×4 IMPLANT
BASKET BONE COLLECTION (BASKET) ×1 IMPLANT
BENZOIN TINCTURE PRP APPL 2/3 (GAUZE/BANDAGES/DRESSINGS) ×2 IMPLANT
BIT DRILL 13 (BIT) ×2 IMPLANT
BIT DRILL NEURO 2X3.1 SFT TUCH (MISCELLANEOUS) ×1 IMPLANT
BLADE CLIPPER SURG (BLADE) IMPLANT
BLADE SURG 15 STRL LF DISP TIS (BLADE) IMPLANT
BLADE SURG 15 STRL SS (BLADE)
BLADE ULTRA TIP 2M (BLADE) IMPLANT
BUR MATCHSTICK NEURO 3.0 LAGG (BURR) ×2 IMPLANT
CANISTER SUCT 3000ML PPV (MISCELLANEOUS) ×2 IMPLANT
COLLAR CERV LO CONTOUR FIRM DE (SOFTGOODS) IMPLANT
DECANTER SPIKE VIAL GLASS SM (MISCELLANEOUS) IMPLANT
DEVICE EDNSKLTN TC NNLCK MED 8 (Cage) ×1 IMPLANT
DRAPE C-ARM 42X72 X-RAY (DRAPES) ×4 IMPLANT
DRAPE LAPAROTOMY 100X72 PEDS (DRAPES) ×2 IMPLANT
DRAPE MICROSCOPE LEICA (MISCELLANEOUS) ×2 IMPLANT
DRAPE SHEET LG 3/4 BI-LAMINATE (DRAPES) ×2 IMPLANT
DRESSING MEPILEX FLEX 4X4 (GAUZE/BANDAGES/DRESSINGS) IMPLANT
DRILL NEURO 2X3.1 SOFT TOUCH (MISCELLANEOUS) ×2
DRSG MEPILEX FLEX 4X4 (GAUZE/BANDAGES/DRESSINGS)
DRSG OPSITE 4X5.5 SM (GAUZE/BANDAGES/DRESSINGS) IMPLANT
DRSG OPSITE POSTOP 3X4 (GAUZE/BANDAGES/DRESSINGS) ×1 IMPLANT
DRSG OPSITE POSTOP 4X6 (GAUZE/BANDAGES/DRESSINGS) ×2 IMPLANT
DURAPREP 26ML APPLICATOR (WOUND CARE) ×2 IMPLANT
ELECT COATED BLADE 2.86 ST (ELECTRODE) ×2 IMPLANT
ELECT REM PT RETURN 9FT ADLT (ELECTROSURGICAL) ×2
ELECTRODE REM PT RTRN 9FT ADLT (ELECTROSURGICAL) ×1 IMPLANT
ENDOSKELTON IMPLANT TC MED 8MM (Cage) ×2 IMPLANT
EVACUATOR 1/8 PVC DRAIN (DRAIN) ×2 IMPLANT
GAUZE 4X4 16PLY ~~LOC~~+RFID DBL (SPONGE) ×2 IMPLANT
GLOVE SURG LTX SZ7.5 (GLOVE) ×2 IMPLANT
GLOVE SURG UNDER POLY LF SZ7.5 (GLOVE) ×2 IMPLANT
GOWN STRL REUS W/ TWL LRG LVL3 (GOWN DISPOSABLE) IMPLANT
GOWN STRL REUS W/ TWL XL LVL3 (GOWN DISPOSABLE) ×1 IMPLANT
GOWN STRL REUS W/TWL 2XL LVL3 (GOWN DISPOSABLE) ×4 IMPLANT
GOWN STRL REUS W/TWL LRG LVL3 (GOWN DISPOSABLE)
GOWN STRL REUS W/TWL XL LVL3 (GOWN DISPOSABLE) ×2
HEMOSTAT POWDER KIT SURGIFOAM (HEMOSTASIS) ×2 IMPLANT
KIT BASIN OR (CUSTOM PROCEDURE TRAY) ×2 IMPLANT
KIT TURNOVER KIT B (KITS) ×2 IMPLANT
NDL SPNL 22GX3.5 QUINCKE BK (NEEDLE) ×1 IMPLANT
NEEDLE HYPO 22GX1.5 SAFETY (NEEDLE) ×2 IMPLANT
NEEDLE SPNL 22GX3.5 QUINCKE BK (NEEDLE) ×2 IMPLANT
NS IRRIG 1000ML POUR BTL (IV SOLUTION) ×2 IMPLANT
PACK LAMINECTOMY NEURO (CUSTOM PROCEDURE TRAY) ×2 IMPLANT
PAD ARMBOARD 7.5X6 YLW CONV (MISCELLANEOUS) ×6 IMPLANT
PATTIES SURGICAL .5 X3 (DISPOSABLE) ×1 IMPLANT
PIN DISTRACTION 14MM (PIN) ×4 IMPLANT
PLATE ELITE VISION 25MM (Plate) ×2 IMPLANT
PUTTY DBF 1CC CORTICAL FIBERS (Putty) ×2 IMPLANT
SCREW SPINAL 4X16MM SELF DRILL (Screw) ×8 IMPLANT
SPONGE INTESTINAL PEANUT (DISPOSABLE) ×2 IMPLANT
SPONGE SURGIFOAM ABS GEL SZ50 (HEMOSTASIS) IMPLANT
STAPLER VISISTAT 35W (STAPLE) IMPLANT
STRIP CLOSURE SKIN 1/2X4 (GAUZE/BANDAGES/DRESSINGS) ×2 IMPLANT
SUT MNCRL AB 4-0 PS2 18 (SUTURE) ×2 IMPLANT
SUT SILK 2 0 TIES 10X30 (SUTURE) IMPLANT
SUT VIC AB 0 CT1 27 (SUTURE) ×2
SUT VIC AB 0 CT1 27XBRD ANTBC (SUTURE) ×1 IMPLANT
SUT VIC AB 3-0 SH 8-18 (SUTURE) ×2 IMPLANT
TAPE CLOTH 3X10 TAN LF (GAUZE/BANDAGES/DRESSINGS) ×1 IMPLANT
TOWEL GREEN STERILE (TOWEL DISPOSABLE) ×2 IMPLANT
TOWEL GREEN STERILE FF (TOWEL DISPOSABLE) ×2 IMPLANT
WATER STERILE IRR 1000ML POUR (IV SOLUTION) ×2 IMPLANT

## 2021-08-30 NOTE — Progress Notes (Signed)
Pt and pt's wife refusing insulin.  Said they don't want him becoming "dependent" on it.  Educated pt and pt's wife on insulin for elevated CBG and diabetes.  Dr. Grandville Silos made aware.  CBG 124 at this time.
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
## 2021-08-30 NOTE — Progress Notes (Addendum)
Patient ID: Carlos [name]., male   DOB: 1971/07/10, 50 y.o.   MRN: [id number] Follow up - Trauma Critical Care  Patient Details:    Carlos [name]. is an 50 y.o. male.  Lines/tubes : External Urinary Catheter (Active)  Collection Container Standard drainage bag 08/29/21 1915  Suction (Verified suction is between 40-80 mmHg) Yes 08/29/21 1915  Securement Method Securing device (Describe) 08/29/21 1915  Site Assessment Clean;Intact 08/29/21 1915    Microbiology/Sepsis markers: Results for orders placed or performed during the hospital encounter of 08/29/21  Resp Panel by RT-PCR (Flu A&B, Covid) Nasopharyngeal Swab     Status: Abnormal   Collection Time: 08/29/21  4:59 PM   Specimen: Nasopharyngeal Swab; Nasopharyngeal(NP) swabs in vial transport medium  Result Value Ref Range Status   SARS Coronavirus 2 by RT PCR POSITIVE (A) NEGATIVE Final    Comment: RESULT CALLED TO, READ BACK BY AND VERIFIED WITHStan Head RN 1851 08/29/21 A BROWNING (NOTE) SARS-CoV-2 target nucleic acids are DETECTED.  The SARS-CoV-2 RNA is generally detectable in upper respiratory specimens during the acute phase of infection. Positive results are indicative of the presence of the identified virus, but do not rule out bacterial infection or co-infection with other pathogens not detected by the test. Clinical correlation with patient history and other diagnostic information is necessary to determine patient infection status. The expected result is Negative.  Fact Sheet for Patients: EntrepreneurPulse.com.au  Fact Sheet for Healthcare Providers: IncredibleEmployment.be  This test is not yet approved or cleared by the [place] FDA and  has been authorized for detection and/or diagnosis of SARS-CoV-2 by FDA under an Emergency Use Authorization (EUA).  This EUA will remain in effect (meaning this test can  be used) for the duration of  the COVID-19 declaration  under Section 564(b)(1) of the Act, 21 U.S.C. section 360bbb-3(b)(1), unless the authorization is terminated or revoked sooner.     Influenza A by PCR NEGATIVE NEGATIVE Final   Influenza B by PCR NEGATIVE NEGATIVE Final    Comment: (NOTE) The Xpert Xpress SARS-CoV-2/FLU/RSV plus assay is intended as an aid in the diagnosis of influenza from Nasopharyngeal swab specimens and should not be used as a sole basis for treatment. Nasal washings and aspirates are unacceptable for Xpert Xpress SARS-CoV-2/FLU/RSV testing.  Fact Sheet for Patients: EntrepreneurPulse.com.au  Fact Sheet for Healthcare Providers: IncredibleEmployment.be  This test is not yet approved or cleared by the [place] FDA and has been authorized for detection and/or diagnosis of SARS-CoV-2 by FDA under an Emergency Use Authorization (EUA). This EUA will remain in effect (meaning this test can be used) for the duration of the COVID-19 declaration under Section 564(b)(1) of the Act, 21 U.S.C. section 360bbb-3(b)(1), unless the authorization is terminated or revoked.  Performed at [facility], [place] [address], [place], [place] [address]     Anti-infectives:  Anti-infectives (From admission, onward)    None       Best Practice/Protocols:  VTE Prophylaxis: Mechanical .  Consults: Treatment Team:  Vallarie Mare, MD    Studies:    Events:  Subjective:    Overnight Issues:   Objective:  Vital signs for last 24 hours: Temp:  [98.1 F (36.7 C)-99.1 F (37.3 C)] 98.1 F (36.7 C) (09/15 0400) Pulse Rate:  [79-103] 83 (09/15 0800) Resp:  [11-29] 18 (09/15 0800) BP: (73-155)/(54-96) 132/81 (09/15 0800) SpO2:  [87 %-100 %] 98 % (09/15 0800) Weight:  [65.8 kg] 65.8 kg (09/14  1508)  Hemodynamic parameters for last 24 hours:    Intake/Output from previous day: 09/14 0701 - 09/15 0700 In: 3801.3 [I.V.:3297.5; IV Piggyback:503.8] Out: 2350  [Urine:2350]  Intake/Output this shift: Total I/O In: 50.1 [IV Piggyback:50.1] Out: -   Vent settings for last 24 hours:    Physical Exam:  General: alert and no respiratory distress Neuro: alert, oriented, and BUE no wrist extension, no finger extension, has grip, biceps 4/5, triceps 3/5 HEENT/Neck: collar Resp: clear to auscultation bilaterally CVS: RRR GI: soft, nontender, BS WNL, no r/g Extremities: no edema  Results for orders placed or performed during the hospital encounter of 08/29/21 (from the past 24 hour(s))  Comprehensive metabolic panel     Status: Abnormal   Collection Time: 08/29/21  2:57 PM  Result Value Ref Range   Sodium 136 135 - 145 mmol/L   Potassium 3.8 3.5 - 5.1 mmol/L   Chloride 98 98 - 111 mmol/L   CO2 21 (L) 22 - 32 mmol/L   Glucose, Bld 285 (H) 70 - 99 mg/dL   BUN 10 6 - 20 mg/dL   Creatinine, Ser 0.86 0.61 - 1.24 mg/dL   Calcium 9.0 8.9 - 10.3 mg/dL   Total Protein 6.8 6.5 - 8.1 g/dL   Albumin 3.4 (L) 3.5 - 5.0 g/dL   AST 37 15 - 41 U/L   ALT 19 0 - 44 U/L   Alkaline Phosphatase 73 38 - 126 U/L   Total Bilirubin 0.5 0.3 - 1.2 mg/dL   GFR, Estimated >60 >60 mL/min   Anion gap 17 (H) 5 - 15  CBC     Status: Abnormal   Collection Time: 08/29/21  2:57 PM  Result Value Ref Range   WBC 5.7 4.0 - 10.5 K/uL   RBC 4.15 (L) 4.22 - 5.81 MIL/uL   Hemoglobin 13.2 13.0 - 17.0 g/dL   HCT 38.5 (L) 39.0 - 52.0 %   MCV 92.8 80.0 - 100.0 fL   MCH 31.8 26.0 - 34.0 pg   MCHC 34.3 30.0 - 36.0 g/dL   RDW 11.9 11.5 - 15.5 %   Platelets 339 150 - 400 K/uL   nRBC 0.0 0.0 - 0.2 %  Ethanol     Status: Abnormal   Collection Time: 08/29/21  2:57 PM  Result Value Ref Range   Alcohol, Ethyl (B) 174 (H) <10 mg/dL  Lactic acid, plasma     Status: Abnormal   Collection Time: 08/29/21  2:57 PM  Result Value Ref Range   Lactic Acid, Venous 3.8 (HH) 0.5 - 1.9 mmol/L  Protime-INR     Status: None   Collection Time: 08/29/21  2:57 PM  Result Value Ref Range    Prothrombin Time 12.7 11.4 - 15.2 seconds   INR 1.0 0.8 - 1.2  I-Stat Chem 8, ED     Status: Abnormal   Collection Time: 08/29/21  3:24 PM  Result Value Ref Range   Sodium 136 135 - 145 mmol/L   Potassium 3.8 3.5 - 5.1 mmol/L   Chloride 99 98 - 111 mmol/L   BUN 10 6 - 20 mg/dL   Creatinine, Ser 1.00 0.61 - 1.24 mg/dL   Glucose, Bld 286 (H) 70 - 99 mg/dL   Calcium, Ion 1.08 (L) 1.15 - 1.40 mmol/L   TCO2 24 22 - 32 mmol/L   Hemoglobin 12.9 (L) 13.0 - 17.0 g/dL   HCT 38.0 (L) 39.0 - 52.0 %  Sample to Blood Bank     Status:  None   Collection Time: 08/29/21  3:42 PM  Result Value Ref Range   Blood Bank Specimen SAMPLE AVAILABLE FOR TESTING    Sample Expiration      08/30/2021,2359 Performed at [facility], [place] [address] [place], [place] [address]   Resp Panel by RT-PCR (Flu A&B, Covid) Nasopharyngeal Swab     Status: Abnormal   Collection Time: 08/29/21  4:59 PM   Specimen: Nasopharyngeal Swab; Nasopharyngeal(NP) swabs in vial transport medium  Result Value Ref Range   SARS Coronavirus 2 by RT PCR POSITIVE (A) NEGATIVE   Influenza A by PCR NEGATIVE NEGATIVE   Influenza B by PCR NEGATIVE NEGATIVE  Glucose, capillary     Status: Abnormal   Collection Time: 08/29/21 10:17 PM  Result Value Ref Range   Glucose-Capillary 157 (H) 70 - 99 mg/dL  Hemoglobin A1c     Status: Abnormal   Collection Time: 08/30/21  5:00 AM  Result Value Ref Range   Hgb A1c MFr Bld 8.4 (H) 4.8 - 5.6 %   Mean Plasma Glucose 194.38 mg/dL    Assessment & Plan: Present on Admission: **None**    LOS: 1 day   Additional comments:I reviewed the patient's new clinical lab test results. And radiology studies 50 yo M [facility]  L rib fxs with PTX - CT placed in ED. Keep to suction. IS/pulm toilet. Multimodal pain control. Check CXR now and in AM L clavicle fx - non-op per Dr. Sammuel Hines, sling L scapula fx - non-op per Dr. Sammuel Hines, sling C6 spinous process and pedical fractures - central cord SCI, for ACDF  today by Dr. Marcello Moores T6 compression fx - continue spine precautions. Per Dr. Marcello Moores fixation planned next week DM - poorly controlled at home, Hba1c 8.4, SSI FEN: NPO for OR Foley - place now for acute urinary retention ID: [id number], periop ABX VTE: PAS  Dispo: ICU, OR Critical Care Total Time*: 35 Minutes  Georganna Skeans, MD, MPH, FACS Trauma & General Surgery Use [email or web address] to contact on call provider  08/30/2021  *Care during the described time interval was provided by me. I have reviewed this patient's available data, including medical history, events of note, physical examination and test results as part of my evaluation.

## 2021-08-30 NOTE — Discharge Instructions (Addendum)
Discharge Instructions    Attending Surgeon: Vanetta Mulders, MD Office Phone Number: [phone]   Discharge Plan:    Diet: Resume usual diet. Begin with light or bland foods.  Drink plenty of fluids.  Activity:  No lifting with your left arm. You may be gentle range of motion of the left shoulder, elbow, wrist following discharge from hospital Please call Dr. Eddie Dibbles office on DC to set up an appointment.  PNEUMOTHORAX OR HEMOTHORAX +/- RIB FRACTURES  HOME INSTRUCTIONS   PAIN CONTROL:  Pain is best controlled by a usual combination of three different methods TOGETHER:  Ice/Heat Over the counter pain medication Prescription pain medication You may experience some swelling and bruising in area of broken ribs. Ice packs or heating pads (30-60 minutes up to 6 times a day) will help. Use ice for the first few days to help decrease swelling and bruising, then switch to heat to help relax tight/sore spots and speed recovery. Some people prefer to use ice alone, heat alone, alternating between ice & heat. Experiment to what works for you. Swelling and bruising can take several weeks to resolve.  It is helpful to take an over-the-counter pain medication regularly for the first few weeks. Choose one of the following that works best for you:  Naproxen (Aleve, etc) Two 265m tabs twice a day Ibuprofen (Advil, etc) Three 2071mtabs four times a day (every meal & bedtime) Acetaminophen (Tylenol, etc) 500-65060mour times a day (every meal & bedtime) A prescription for pain medication (such as oxycodone, hydrocodone, etc) may be given to you upon discharge. Take your pain medication as prescribed.  If you are having problems/concerns with the prescription medicine (does not control pain, nausea, vomiting, rash, itching, etc), please call us [phone] see if we need to switch you to a different pain medicine that will work better for you and/or control your side effect better. If  you need a refill on your pain medication, please contact your pharmacy. They will contact our office to request authorization. Prescriptions will not be filled after 5 pm or on week-ends. Avoid getting constipated. When taking pain medications, it is common to experience some constipation. Increasing fluid intake and taking a fiber supplement (such as Metamucil, Citrucel, FiberCon, MiraLax, etc) 1-2 times a day regularly will usually help prevent this problem from occurring. A mild laxative (prune juice, Milk of Magnesia, MiraLax, etc) should be taken according to package directions if there are no bowel movements after 48 hours.  Watch out for diarrhea. If you have many loose bowel movements, simplify your diet to bland foods & liquids for a few days. Stop any stool softeners and decrease your fiber supplement. Switching to mild anti-diarrheal medications (Kayopectate, Pepto Bismol) can help. If this worsens or does not improve, please call us.[place]hest tube site wound: you may remove the dressing from your chest tube site 3 days after the removal of your chest tube. DO NOT shower over the dressing. Once   removed, you may shower as normal. Do not submerge your wound in water for 2-3 weeks.  FOLLOW UP  Please call our office to set up or confirm an appointment for follow up for 2 weeks after discharge. [facility] will be outlined in your follow up instructions. Please call [facility] at [phone] if you have any questions about follow up.  If you have any orthopedic or  other injuries you will need to follow up as outlined in your follow up instructions.   WHEN TO CALL us [phone]:  Poor pain control Reactions / problems with new medications (rash/itching, nausea, etc)  Fever over 101.5 F (38.5 C) Worsening swelling or bruising Redness, drainage, pain or swelling around chest tube site Worsening pain, productive cough,  difficulty breathing or any other concerning symptoms  The clinic staff is available to answer your questions during regular business hours (8:30am-5pm). Please don't hesitate to call and ask to speak to one of our nurses for clinical concerns.  If you have a medical emergency, go to the nearest emergency room or call 911.  A surgeon from [facility] is always on call at the [facility], [place], [place], [place], [place] [address] ?  MAIN: [phone] ? TOLL FREE: [phone] ?  FAX [phone]  [email or web address]      Information on Rib Fractures  A rib fracture is a break or crack in one of the bones of the ribs. The ribs are long, curved bones that wrap around your chest and attach to your spine and your breastbone. The ribs protect your heart, lungs, and other organs in the chest. A broken or cracked rib is often painful but is not usually serious. Most rib fractures heal on their own over time. However, rib fractures can be more serious if multiple ribs are broken or if broken ribs move out of place and push against other structures or organs. What are the causes? This condition is caused by: Repetitive movements with high force, such as pitching a baseball or having severe coughing spells. A direct blow to the chest, such as a sports injury, a car accident, or a fall. Cancer that has spread to the bones, which can weaken bones and cause them to break. What are the signs or symptoms? Symptoms of this condition include: Pain when you breathe in or cough. Pain when someone presses on the injured area. Feeling short of breath. How is this diagnosed? This condition is diagnosed with a physical exam and medical history. Imaging tests may also be done, such as: Chest X-ray. CT scan. MRI. Bone scan. Chest ultrasound. How is this treated? Treatment for this condition depends on the severity of the  fracture. Most rib fractures usually heal on their own in 1-3 months. Sometimes healing takes longer if there is a cough that does not stop or if there are other activities that make the injury worse (aggravating factors). While you heal, you will be given medicines to control the pain. You will also be taught deep breathing exercises. Severe injuries may require hospitalization or surgery. Follow these instructions at home: Managing pain, stiffness, and swelling If directed, apply ice to the injured area. Put ice in a plastic bag. Place a towel between your skin and the bag. Leave the ice on for 20 minutes, 2-3 times a day. Take over-the-counter and prescription medicines only as told by your health care provider. Activity Avoid a lot of activity and any activities or movements that cause pain. Be careful during activities and avoid bumping the injured rib. Slowly increase your activity as told by your health care provider. General instructions Do deep breathing exercises as told by your health care provider. This helps prevent pneumonia, which is a common complication of a broken rib. Your health care provider may instruct you to: Take deep breaths several times a day.  Try to cough several times a day, holding a pillow against the injured area. Use a device called incentive spirometer to practice deep breathing several times a day. Drink enough fluid to keep your urine pale yellow. Do not wear a rib belt or binder. These restrict breathing, which can lead to pneumonia. Keep all follow-up visits as told by your health care provider. This is important. Contact a health care provider if: You have a fever. Get help right away if: You have difficulty breathing or you are short of breath. You develop a cough that does not stop, or you cough up thick or bloody sputum. You have nausea, vomiting, or pain in your abdomen. Your pain gets worse and medicine does not help. Summary A rib fracture is  a break or crack in one of the bones of the ribs. A broken or cracked rib is often painful but is not usually serious. Most rib fractures heal on their own over time. Treatment for this condition depends on the severity of the fracture. Avoid a lot of activity and any activities or movements that cause pain. This information is not intended to replace advice given to you by your health care provider. Make sure you discuss any questions you have with your health care provider. Document Released: 12/02/2005 Document Revised: 03/03/2017 Document Reviewed: 03/03/2017 Elsevier Interactive Patient Education  2019 Elsevier Inc.    Pneumothorax A pneumothorax is commonly called a collapsed lung. It is a condition in which air leaks from a lung and builds up between the thin layer of tissue that covers the lungs (visceral pleura) and the interior wall of the chest cavity (parietal pleura). The air gets trapped outside the lung, between the lung and the chest wall (pleural space). The air takes up space and prevents the lung from fully expanding. This condition sometimes occurs suddenly with no apparent cause. The buildup of air may be small or large. A small pneumothorax may go away on its own. A large pneumothorax will require treatment and hospitalization. What are the causes? This condition may be caused by: Trauma and injury to the chest wall. Surgery and other medical procedures. A complication of an underlying lung problem, especially chronic obstructive pulmonary disease (COPD) or emphysema. Sometimes the cause of this condition is not known. What increases the risk? You are more likely to develop this condition if: You have an underlying lung problem. You smoke. You are 30-69 years old, male, tall, and underweight. You have a personal or family history of pneumothorax. You have an eating disorder (anorexia nervosa). This condition can also happen quickly, even in people with no history of  lung problems. What are the signs or symptoms? Sometimes a pneumothorax will have no symptoms. When symptoms are present, they can include: Chest pain. Shortness of breath. Increased rate of breathing. Bluish color to your lips or skin (cyanosis). How is this diagnosed? This condition may be diagnosed by: A medical history and physical exam. A chest X-ray, chest CT scan, or ultrasound. How is this treated? Treatment depends on how severe your condition is. The goal of treatment is to remove the extra air and allow your lung to expand back to its normal size. For a small pneumothorax: No treatment may be needed. Extra oxygen is sometimes used to make it go away more quickly. For a large pneumothorax or a pneumothorax that is causing symptoms, a procedure is done to drain the air from your lungs. To do this, a health care provider may  use: A needle with a syringe. This is used to suck air from a pleural space where no additional leakage is taking place. A chest tube. This is used to suck air where there is ongoing leakage into the pleural space. The chest tube may need to remain in place for several days until the air leak has healed. In more severe cases, surgery may be needed to repair the damage that is causing the leak. If you have multiple pneumothorax episodes or have an air leak that will not heal, a procedure called a pleurodesis may be done. A medicine is placed in the pleural space to irritate the tissues around the lung so that the lung will stick to the chest wall, seal any leaks, and stop any buildup of air in that space. If you have an underlying lung problem, severe symptoms, or a large pneumothorax you will usually need to stay in the hospital. Follow these instructions at home: Lifestyle Do not use any products that contain nicotine or tobacco, such as cigarettes and e-cigarettes. These are major risk factors in pneumothorax. If you need help quitting, ask your health care  provider. Do not lift anything that is heavier than 10 lb (4.5 kg), or the limit that your health care provider tells you, until he or she says that it is safe. Avoid activities that take a lot of effort (strenuous) for as long as told by your health care provider. Return to your normal activities as told by your health care provider. Ask your health care provider what activities are safe for you. Do not fly in an airplane or scuba dive until your health care provider says it is okay. General instructions Take over-the-counter and prescription medicines only as told by your health care provider. If a cough or pain makes it difficult for you to sleep at night, try sleeping in a semi-upright position in a recliner or by using 2 or 3 pillows. If you had a chest tube and it was removed, ask your health care provider when you can remove the bandage (dressing). While the dressing is in place, do not allow it to get wet. Keep all follow-up visits as told by your health care provider. This is important. Contact a health care provider if: You cough up thick mucus (sputum) that is yellow or green in color. You were treated with a chest tube, and you have redness, increasing pain, or discharge at the site where it was placed. Get help right away if: You have increasing chest pain or shortness of breath. You have a cough that will not go away. You begin coughing up blood. You have pain that is getting worse or is not controlled with medicines. The site where your chest tube was located opens up. You feel air coming out of the site where the chest tube was placed. You have a fever or persistent symptoms for more than 2-3 days. You have a fever and your symptoms suddenly get worse. These symptoms may represent a serious problem that is an emergency. Do not wait to see if the symptoms will go away. Get medical help right away. Call your local emergency services (911 in the [place] Do not drive yourself to the  hospital. Summary A pneumothorax, commonly called a collapsed lung, is a condition in which air leaks from a lung and gets trapped between the lung and the chest wall (pleural space). The buildup of air may be small or large. A small pneumothorax may go away on  its own. A large pneumothorax will require treatment and hospitalization. Treatment for this condition depends on how severe the pneumothorax is. The goal of treatment is to remove the extra air and allow the lung to expand back to its normal size. This information is not intended to replace advice given to you by your health care provider. Make sure you discuss any questions you have with your health care provider. Document Released: 12/02/2005 Document Revised: 11/10/2017 Document Reviewed: 11/10/2017 Elsevier Interactive Patient Education  2019 [employer].

## 2021-08-30 NOTE — Transfer of Care (Signed)
Immediate Anesthesia Transfer of Care Note  Patient: Carlos [name].  Procedure(s) Performed: CERVICAL FIVE-SIX ANTERIOR CERVICAL DECOMPRESSION/DISCECTOMY FUSION (Neck)  Patient Location: PACU  Anesthesia Type:General  Level of Consciousness: awake and alert   Airway & Oxygen Therapy: Patient Spontanous Breathing and Patient connected to nasal cannula oxygen  Post-op Assessment: Report given to RN, Post -op Vital signs reviewed and stable and Patient moving all extremities X 4  Post vital signs: Reviewed and stable  Last Vitals:  Vitals Value Taken Time  BP 156/92 08/30/21 2116  Temp    Pulse 96 08/30/21 2117  Resp 14 08/30/21 2117  SpO2 100 % 08/30/21 2117  Vitals shown include unvalidated device data.  Last Pain:  Vitals:   08/30/21 1755  TempSrc:   PainSc: 5       Patients Stated Pain Goal: 3 (12/08/48 7530)  Complications: No notable events documented.
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
## 2021-08-30 NOTE — Anesthesia Procedure Notes (Signed)
Procedure Name: Intubation Date/Time: 08/30/2021 6:55 PM Performed by: Wilburn Cornelia, CRNA Pre-anesthesia Checklist: Patient identified, Emergency Drugs available, Suction available, Patient being monitored and Timeout performed Patient Re-evaluated:Patient Re-evaluated prior to induction Oxygen Delivery Method: Circle system utilized Preoxygenation: Pre-oxygenation with 100% oxygen Induction Type: IV induction Grade View: Grade I Tube type: Oral Tube size: 7.5 mm Number of attempts: 1 Airway Equipment and Method: Rigid stylet and Video-laryngoscopy Placement Confirmation: ETT inserted through vocal cords under direct vision, positive ETCO2, CO2 detector and breath sounds checked- equal and bilateral Secured at: 22 cm Tube secured with: Tape Dental Injury: Teeth and Oropharynx as per pre-operative assessment  Comments: Head and neck remain neutral without movement (C collar in place during DL with glidescope)

## 2021-08-31 ENCOUNTER — Inpatient Hospital Stay (HOSPITAL_COMMUNITY): Payer: BC Managed Care – PPO

## 2021-08-31 LAB — BASIC METABOLIC PANEL
Anion gap: 20 — ABNORMAL HIGH (ref 5–15)
BUN: 17 mg/dL (ref 6–20)
CO2: 11 mmol/L — ABNORMAL LOW (ref 22–32)
Calcium: 8.6 mg/dL — ABNORMAL LOW (ref 8.9–10.3)
Chloride: 103 mmol/L (ref 98–111)
Creatinine, Ser: 1.12 mg/dL (ref 0.61–1.24)
GFR, Estimated: >60 mL/min (ref >60)
Glucose, Bld: 192 mg/dL — ABNORMAL HIGH (ref 70–99)
Potassium: 4.8 mmol/L (ref 3.5–5.1)
Sodium: 134 mmol/L — ABNORMAL LOW (ref 135–145)

## 2021-08-31 LAB — CBC
HCT: 36.4 % — ABNORMAL LOW (ref 39.0–52.0)
Hemoglobin: 12.1 g/dL — ABNORMAL LOW (ref 13.0–17.0)
MCH: 32.4 pg (ref 26.0–34.0)
MCHC: 33.2 g/dL (ref 30.0–36.0)
MCV: 97.3 fL (ref 80.0–100.0)
Platelets: 256 10*3/uL (ref 150–400)
RBC: 3.74 MIL/uL — ABNORMAL LOW (ref 4.22–5.81)
RDW: 11.9 % (ref 11.5–15.5)
WBC: 12 10*3/uL — ABNORMAL HIGH (ref 4.0–10.5)
nRBC: 0 % (ref 0.0–0.2)

## 2021-08-31 LAB — GLUCOSE, CAPILLARY
Glucose-Capillary: 186 mg/dL — ABNORMAL HIGH (ref 70–99)
Glucose-Capillary: 221 mg/dL — ABNORMAL HIGH (ref 70–99)
Glucose-Capillary: 206 mg/dL — ABNORMAL HIGH (ref 70–99)

## 2021-08-31 IMAGING — DX DG CHEST 1V PORT
1 series · 2 of 2 positions shown · non-contrast
Comparison: One day prior

CLINICAL DATA: Left-sided pneumothorax.  Chest tube.

EXAM:
PORTABLE CHEST 1 VIEW

[Series 1: chest · 0.14mm/px · 2 of 2 slices shown]
[im 1/2]
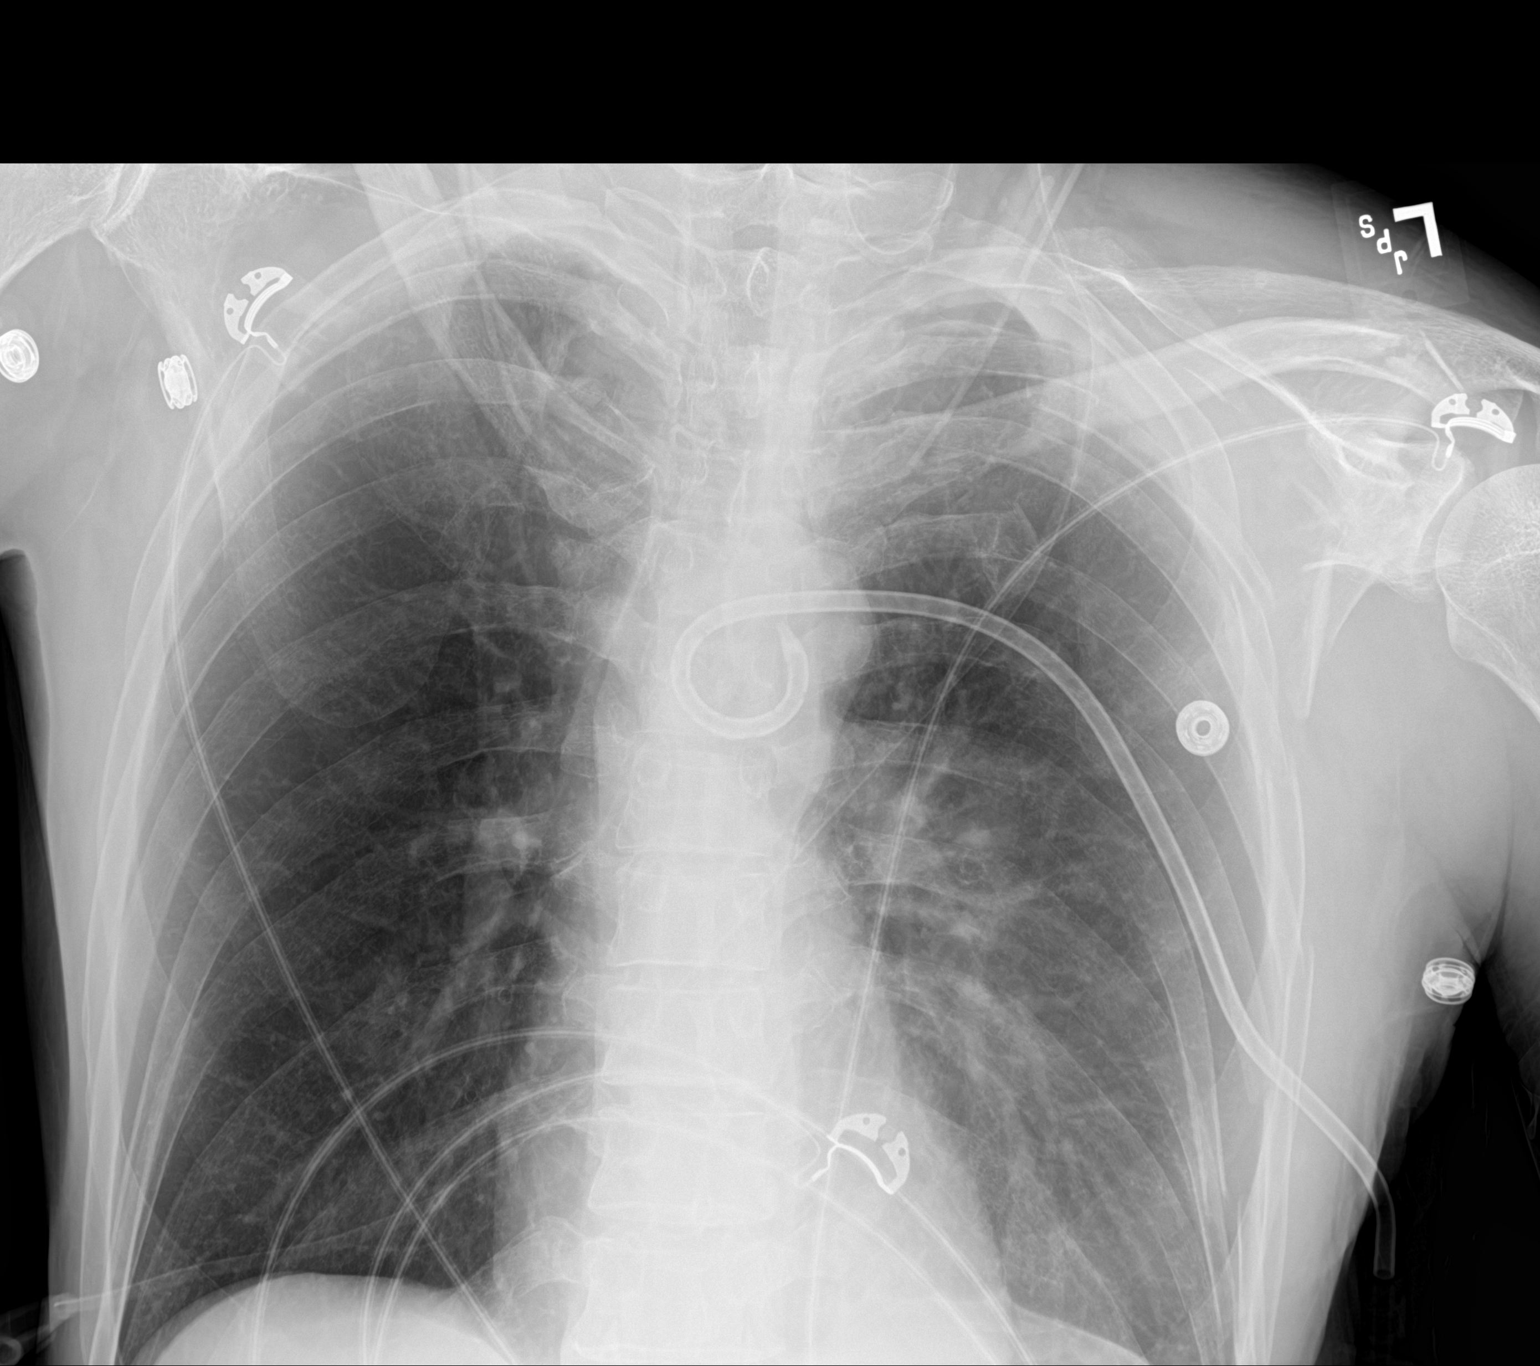
[im 2/2]
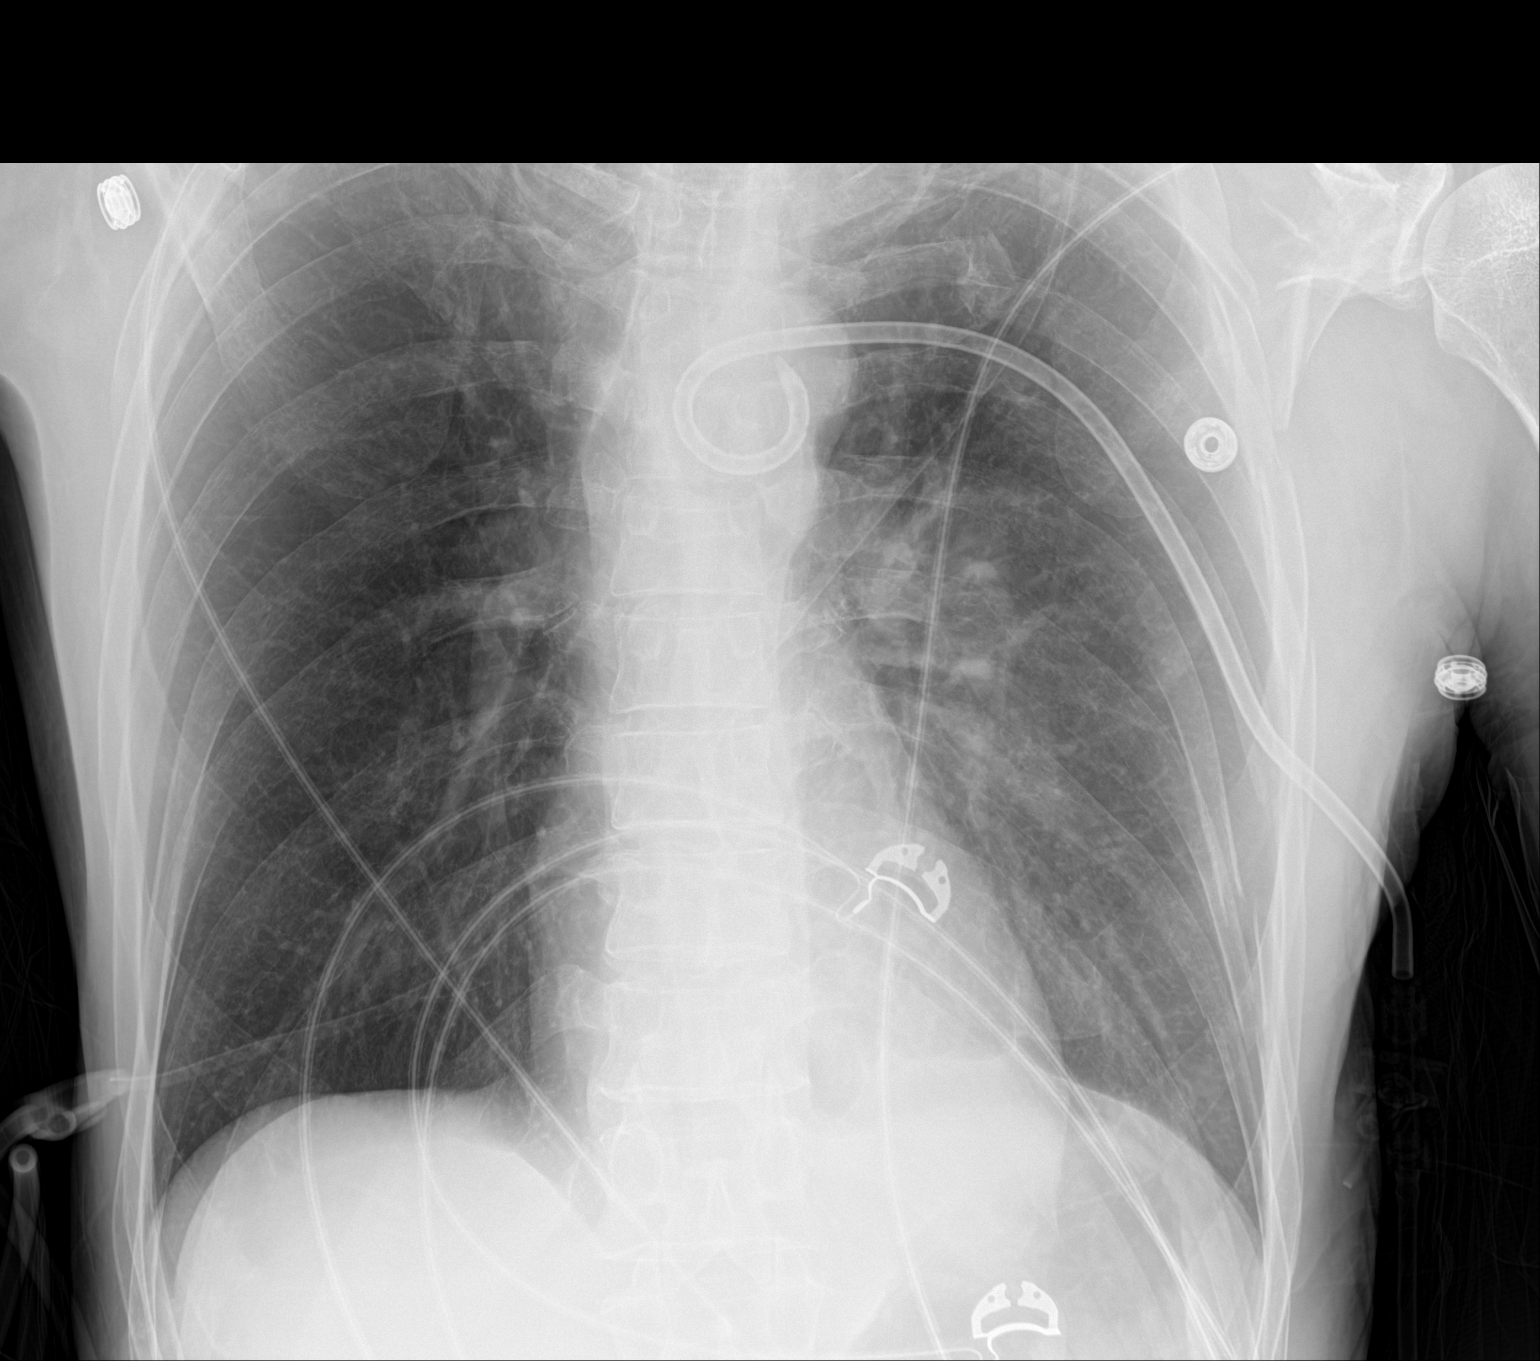

[2 of 2 positions shown; findings below may reference images not displayed]

FINDINGS: Two frontal views of the chest. Midline trachea. Normal heart size.
Left-sided chest tube is unchanged in position, tip projecting over
the transverse aorta, presumably in the medial left pleural space.

No pleural fluid. 5% left apical pneumothorax is similar to
minimally increased since the prior exam. Visceral pleural line 8 mm
from chest wall today versus 6 mm on the prior.

Hyperinflation. Interstitial thickening is asymmetric, greater on
the left and not significantly changed.

Left rib and scapular fractures.
IMPRESSION: Left chest tube in place with similar to minimal increase in 5% left
apical pneumothorax.

Mild pulmonary interstitial thickening which could represent venous
congestion. No overt congestive failure.

## 2021-08-31 MED ORDER — DOCUSATE SODIUM 100 MG PO CAPS
100.0000 mg | ORAL_CAPSULE | Freq: Two times a day (BID) | ORAL | Status: DC | PRN
  Administered 2021-08-31 – 2021-09-04 (×4): 100 mg via ORAL
  Filled 2021-08-31 (×4): qty 1

## 2021-08-31 MED ORDER — KETOROLAC TROMETHAMINE 15 MG/ML IJ SOLN
15.0000 mg | Freq: Once | INTRAMUSCULAR | Status: AC
  Administered 2021-08-31: 15 mg via INTRAVENOUS
  Filled 2021-08-31: qty 1

## 2021-08-31 MED ORDER — ACETAMINOPHEN 500 MG PO TABS
1000.0000 mg | ORAL_TABLET | Freq: Four times a day (QID) | ORAL | Status: DC
  Administered 2021-08-31 – 2021-09-02 (×5): 1000 mg via ORAL
  Filled 2021-08-31 (×6): qty 2

## 2021-08-31 MED ORDER — OXYCODONE HCL 5 MG/5ML PO SOLN
5.0000 mg | ORAL | Status: DC | PRN
  Filled 2021-08-31: qty 10

## 2021-08-31 MED ORDER — MUPIROCIN 2 % EX OINT
1.0000 "application " | TOPICAL_OINTMENT | Freq: Two times a day (BID) | CUTANEOUS | Status: AC
  Administered 2021-08-31 – 2021-09-04 (×10): 1 via NASAL
  Filled 2021-08-31 (×4): qty 22

## 2021-08-31 MED ORDER — HYDROCODONE-ACETAMINOPHEN 5-325 MG PO TABS
1.0000 | ORAL_TABLET | ORAL | Status: DC | PRN
  Administered 2021-08-31: 1 via ORAL
  Administered 2021-09-01 – 2021-09-03 (×11): 2 via ORAL
  Administered 2021-09-03: 1 via ORAL
  Administered 2021-09-04 (×2): 2 via ORAL
  Filled 2021-08-31 (×3): qty 2
  Filled 2021-08-31: qty 1
  Filled 2021-08-31 (×2): qty 2
  Filled 2021-08-31: qty 1
  Filled 2021-08-31 (×10): qty 2

## 2021-08-31 MED ORDER — OXYCODONE HCL 5 MG PO TABS
5.0000 mg | ORAL_TABLET | ORAL | Status: DC | PRN
  Administered 2021-08-31: 5 mg via ORAL
  Administered 2021-08-31: 10 mg via ORAL
  Filled 2021-08-31 (×4): qty 1

## 2021-08-31 MED ORDER — ENOXAPARIN SODIUM 30 MG/0.3ML IJ SOSY
30.0000 mg | PREFILLED_SYRINGE | Freq: Two times a day (BID) | INTRAMUSCULAR | Status: DC
  Administered 2021-08-31 – 2021-09-04 (×9): 30 mg via SUBCUTANEOUS
  Filled 2021-08-31 (×9): qty 0.3

## 2021-08-31 MED ORDER — HYDROMORPHONE HCL 1 MG/ML IJ SOLN
0.5000 mg | INTRAMUSCULAR | Status: DC | PRN
  Administered 2021-08-31 – 2021-09-02 (×11): 0.5 mg via INTRAVENOUS
  Filled 2021-08-31 (×11): qty 1

## 2021-08-31 NOTE — Anesthesia Postprocedure Evaluation (Signed)
Anesthesia Post Note  Patient: Carlos [name].  Procedure(s) Performed: CERVICAL FIVE-SIX ANTERIOR CERVICAL DECOMPRESSION/DISCECTOMY FUSION (Neck)     Patient location during evaluation: PACU Anesthesia Type: General Level of consciousness: awake and alert Pain management: pain level controlled Vital Signs Assessment: post-procedure vital signs reviewed and stable Respiratory status: spontaneous breathing, nonlabored ventilation, respiratory function stable and patient connected to nasal cannula oxygen Cardiovascular status: blood pressure returned to baseline and stable Postop Assessment: no apparent nausea or vomiting Anesthetic complications: no   No notable events documented.  Last Vitals:  Vitals:   08/31/21 0500 08/31/21 0600  BP: 111/72 102/66  Pulse: 90 91  Resp: 17 12  Temp:    SpO2: 100% 100%    Last Pain:  Vitals:   08/31/21 0600  TempSrc:   PainSc: Asleep                 Chantalle Defilippo

## 2021-08-31 NOTE — Anesthesia Postprocedure Evaluation (Signed)
Anesthesia Post Note  Patient: Carlos [name].  Procedure(s) Performed: CERVICAL FIVE-SIX ANTERIOR CERVICAL DECOMPRESSION/DISCECTOMY FUSION (Neck)     Patient location during evaluation: PACU Anesthesia Type: General Level of consciousness: awake and alert Pain management: pain level controlled Vital Signs Assessment: post-procedure vital signs reviewed and stable Respiratory status: spontaneous breathing, nonlabored ventilation, respiratory function stable and patient connected to nasal cannula oxygen Cardiovascular status: blood pressure returned to baseline and stable Postop Assessment: no apparent nausea or vomiting Anesthetic complications: no   No notable events documented.  Last Vitals:  Vitals:   08/31/21 0500 08/31/21 0600  BP: 111/72 102/66  Pulse: 90 91  Resp: 17 12  Temp:    SpO2: 100% 100%    Last Pain:  Vitals:   08/31/21 0600  TempSrc:   PainSc: Asleep                 Jony Ladnier
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
## 2021-09-01 ENCOUNTER — Inpatient Hospital Stay (HOSPITAL_COMMUNITY): Payer: BC Managed Care – PPO

## 2021-09-01 LAB — CBC
HCT: 28.8 % — ABNORMAL LOW (ref 39.0–52.0)
Hemoglobin: 10.2 g/dL — ABNORMAL LOW (ref 13.0–17.0)
MCH: 33 pg (ref 26.0–34.0)
MCHC: 35.4 g/dL (ref 30.0–36.0)
MCV: 93.2 fL (ref 80.0–100.0)
Platelets: 219 10*3/uL (ref 150–400)
RBC: 3.09 MIL/uL — ABNORMAL LOW (ref 4.22–5.81)
RDW: 11.9 % (ref 11.5–15.5)
WBC: 7 10*3/uL (ref 4.0–10.5)
nRBC: 0 % (ref 0.0–0.2)

## 2021-09-01 LAB — BASIC METABOLIC PANEL
Anion gap: 11 (ref 5–15)
BUN: 16 mg/dL (ref 6–20)
CO2: 19 mmol/L — ABNORMAL LOW (ref 22–32)
Calcium: 8.3 mg/dL — ABNORMAL LOW (ref 8.9–10.3)
Chloride: 101 mmol/L (ref 98–111)
Creatinine, Ser: 0.88 mg/dL (ref 0.61–1.24)
GFR, Estimated: >60 mL/min (ref >60)
Glucose, Bld: 215 mg/dL — ABNORMAL HIGH (ref 70–99)
Potassium: 3.8 mmol/L (ref 3.5–5.1)
Sodium: 131 mmol/L — ABNORMAL LOW (ref 135–145)

## 2021-09-01 LAB — GLUCOSE, CAPILLARY
Glucose-Capillary: 172 mg/dL — ABNORMAL HIGH (ref 70–99)
Glucose-Capillary: 191 mg/dL — ABNORMAL HIGH (ref 70–99)
Glucose-Capillary: 181 mg/dL — ABNORMAL HIGH (ref 70–99)

## 2021-09-01 IMAGING — DX DG CHEST 1V PORT
1 series · 1 of 1 positions shown · non-contrast
Comparison: [DATE]

CLINICAL DATA: Respiratory failure.

EXAM:
PORTABLE CHEST 1 VIEW

[chest ap]
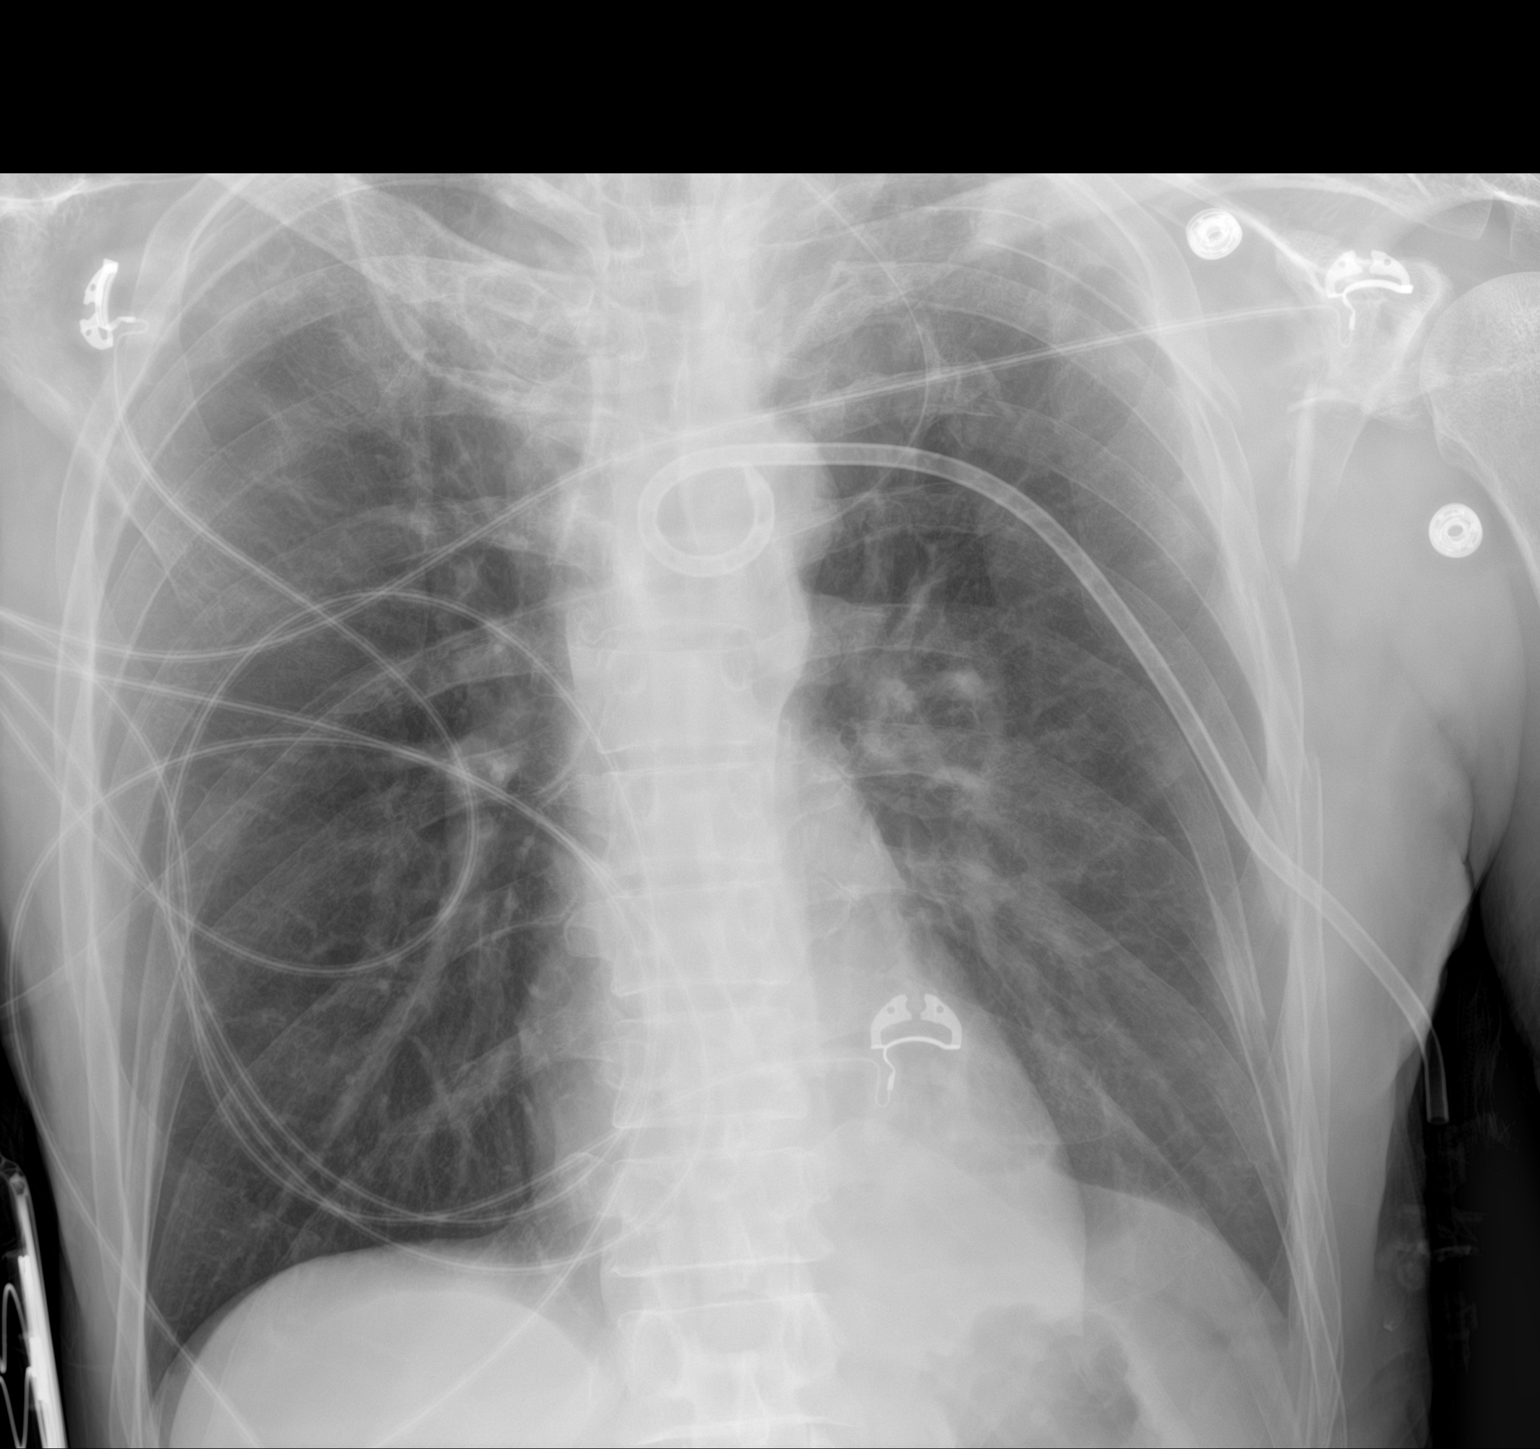

[1 of 1 positions shown; findings below may reference images not displayed]

FINDINGS: Left-sided rib fractures are again identified. The left chest tube
is stable. A tiny left apical pneumothoraxa remains, unchanged. No
other interval changes or acute abnormalities.
IMPRESSION: 1. The left chest tube is stable. The tiny left apical pneumothorax
is stable. Numerous left-sided rib fractures are stable.

## 2021-09-01 MED ORDER — BETHANECHOL CHLORIDE 25 MG PO TABS
25.0000 mg | ORAL_TABLET | Freq: Three times a day (TID) | ORAL | Status: DC
  Administered 2021-09-01 – 2021-09-08 (×21): 25 mg via ORAL
  Filled 2021-09-01 (×22): qty 1

## 2021-09-01 MED ORDER — GABAPENTIN 100 MG PO CAPS
100.0000 mg | ORAL_CAPSULE | Freq: Three times a day (TID) | ORAL | Status: DC
  Administered 2021-09-01 – 2021-09-05 (×13): 100 mg via ORAL
  Filled 2021-09-01 (×13): qty 1

## 2021-09-01 MED ORDER — DIPHENHYDRAMINE HCL 25 MG PO CAPS
25.0000 mg | ORAL_CAPSULE | Freq: Four times a day (QID) | ORAL | Status: DC | PRN
  Administered 2021-09-01 – 2021-09-08 (×9): 25 mg via ORAL
  Filled 2021-09-01 (×10): qty 1
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
## 2021-09-01 NOTE — Progress Notes (Signed)
Subjective: Patient reports  overall neurologically stable minimal but slight improvement from preop.  Objective: Vital signs in last 24 hours: Temp:  [97.5 F (36.4 C)-98.9 F (37.2 C)] 98 F (36.7 C) (09/17 0725) Pulse Rate:  [74-102] 84 (09/17 0725) Resp:  [13-25] 13 (09/17 0725) BP: (104-125)/(59-81) 104/62 (09/17 0725) SpO2:  [97 %-100 %] 99 % (09/17 0725)  Intake/Output from previous day: 09/16 0701 - 09/17 0700 In: 378 [I.V.:228; IV Piggyback:150] Out: 2100 [Urine:2100] Intake/Output this shift: No intake/output data recorded.  What appears to be stable central cord injury with 4 out of 5 movement lower extremities significant left upper extremity pain and weakness with 1-2 grip strength 4 - bicep unable to assess shoulder secondary to trauma right upper extremity is 4-5 with 2-3 out of 5 grip incision clean dry and intact  Lab Results: Recent Labs    08/31/21 0150 09/01/21 0306  WBC 12.0* 7.0  HGB 12.1* 10.2*  HCT 36.4* 28.8*  PLT 256 219   BMET Recent Labs    08/31/21 0150 09/01/21 0306  NA 134* 131*  K 4.8 3.8  CL 103 101  CO2 11* 19*  GLUCOSE 192* 215*  BUN 17 16  CREATININE 1.12 0.88  CALCIUM 8.6* 8.3*    Studies/Results: DG Cervical Spine 2 or 3 views  Result Date: 08/30/2021 CLINICAL DATA:  CERVICAL FIVE-SIX ANTERIOR CERVICAL DECOMPRESSION/DISCECTOMY FUSION RSTO EXAM: CERVICAL SPINE - 2-3 VIEW COMPARISON:  CT cervical spine 08/29/2021, MRI cervical spine 08/30/2021 FINDINGS: Intraoperative C5-C6 anterior and interbody fusion. # low resolution intraoperative spot views of the midcervical spine were obtained. No fracture visible on the limited views. Endotracheal tube partially visualized. Total fluoroscopy time: 18 seconds Total radiation dose: 0.91 mGy IMPRESSION: Intraoperative C5-C6 anterior and interbody fusion with no radiographic findings to suggest complication. Electronically Signed   By: Iven Finn M.D.   On: 08/30/2021 21:03   DG CHEST  PORT 1 VIEW  Result Date: 08/31/2021 CLINICAL DATA:  Left-sided pneumothorax.  Chest tube. EXAM: PORTABLE CHEST 1 VIEW COMPARISON:  One day prior FINDINGS: Two frontal views of the chest. Midline trachea. Normal heart size. Left-sided chest tube is unchanged in position, tip projecting over the transverse aorta, presumably in the medial left pleural space. No pleural fluid. 5% left apical pneumothorax is similar to minimally increased since the prior exam. Visceral pleural line 8 mm from chest wall today versus 6 mm on the prior. Hyperinflation. Interstitial thickening is asymmetric, greater on the left and not significantly changed. Left rib and scapular fractures. IMPRESSION: Left chest tube in place with similar to minimal increase in 5% left apical pneumothorax. Mild pulmonary interstitial thickening which could represent venous congestion. No overt congestive failure. Electronically Signed   By: Abigail Miyamoto M.D.   On: 08/31/2021 08:19   DG CHEST PORT 1 VIEW  Result Date: 08/30/2021 CLINICAL DATA:  Follow-up left pneumothorax EXAM: PORTABLE CHEST 1 VIEW COMPARISON:  08/29/2021 FINDINGS: Left chest tube remains in place. Small residual left apical pneumothorax. Patchy scarring in the upper lobes. Otherwise no confluent opacities or effusions. Heart is normal size. Multiple left rib fractures noted. IMPRESSION: Stable left chest tube position with small left apical pneumothorax. Electronically Signed   By: Rolm Baptise M.D.   On: 08/30/2021 09:50   DG C-Arm 1-60 Min-No Report  Result Date: 08/30/2021 Fluoroscopy was utilized by the requesting physician.  No radiographic interpretation.    Assessment/Plan: 50 year old status post multitrauma status post ACDF for central cord syndrome awaiting back surgery  pending early this week per Dr. Marcello Moores.  LOS: 3 days     Elaina Hoops 09/01/2021, 8:09 AM

## 2021-09-02 ENCOUNTER — Inpatient Hospital Stay (HOSPITAL_COMMUNITY): Payer: BC Managed Care – PPO

## 2021-09-02 ENCOUNTER — Encounter (HOSPITAL_COMMUNITY): Payer: Self-pay | Admitting: Neurosurgery

## 2021-09-02 LAB — BASIC METABOLIC PANEL
Anion gap: 9 (ref 5–15)
BUN: 8 mg/dL (ref 6–20)
CO2: 23 mmol/L (ref 22–32)
Calcium: 8.1 mg/dL — ABNORMAL LOW (ref 8.9–10.3)
Chloride: 101 mmol/L (ref 98–111)
Creatinine, Ser: 0.62 mg/dL (ref 0.61–1.24)
GFR, Estimated: >60 mL/min (ref >60)
Glucose, Bld: 195 mg/dL — ABNORMAL HIGH (ref 70–99)
Potassium: 3.5 mmol/L (ref 3.5–5.1)
Sodium: 133 mmol/L — ABNORMAL LOW (ref 135–145)

## 2021-09-02 LAB — GLUCOSE, CAPILLARY
Glucose-Capillary: 216 mg/dL — ABNORMAL HIGH (ref 70–99)
Glucose-Capillary: 167 mg/dL — ABNORMAL HIGH (ref 70–99)
Glucose-Capillary: 250 mg/dL — ABNORMAL HIGH (ref 70–99)
Glucose-Capillary: 219 mg/dL — ABNORMAL HIGH (ref 70–99)

## 2021-09-02 LAB — CBC
HCT: 27.9 % — ABNORMAL LOW (ref 39.0–52.0)
Hemoglobin: 10.1 g/dL — ABNORMAL LOW (ref 13.0–17.0)
MCH: 32.7 pg (ref 26.0–34.0)
MCHC: 36.2 g/dL — ABNORMAL HIGH (ref 30.0–36.0)
MCV: 90.3 fL (ref 80.0–100.0)
Platelets: 199 10*3/uL (ref 150–400)
RBC: 3.09 MIL/uL — ABNORMAL LOW (ref 4.22–5.81)
RDW: 11.9 % (ref 11.5–15.5)
WBC: 5.7 10*3/uL (ref 4.0–10.5)
nRBC: 0 % (ref 0.0–0.2)

## 2021-09-02 MED ORDER — ACETAMINOPHEN 500 MG PO TABS: 500.0000 mg | ORAL_TABLET | Freq: Four times a day (QID) | ORAL | Status: DC | PRN

## 2021-09-02 MED ORDER — HYDROMORPHONE HCL 1 MG/ML IJ SOLN
1.0000 mg | INTRAMUSCULAR | Status: DC | PRN
Start: 2021-09-02 — End: 2021-09-04
  Administered 2021-09-03 – 2021-09-04 (×10): 1 mg via INTRAVENOUS
  Filled 2021-09-02 (×11): qty 1

## 2021-09-02 MED ORDER — METHOCARBAMOL 1000 MG/10ML IJ SOLN
500.0000 mg | Freq: Four times a day (QID) | INTRAVENOUS | Status: DC | PRN
  Administered 2021-09-02 – 2021-09-05 (×4): 500 mg via INTRAVENOUS
  Filled 2021-09-02: qty 5
  Filled 2021-09-02: qty 500
  Filled 2021-09-02 (×2): qty 5

## 2021-09-02 MED ORDER — HYDROMORPHONE HCL 1 MG/ML IJ SOLN
1.0000 mg | Freq: Once | INTRAMUSCULAR | Status: AC
  Administered 2021-09-02: 1 mg via INTRAVENOUS
  Filled 2021-09-02: qty 1

## 2021-09-02 NOTE — Progress Notes (Signed)
  NEUROSURGERY PROGRESS NOTE   No issues overnight. Pt reports primarily back and chest pain. Stable UE and LE weakness.  EXAM:  BP 120/78 (BP Location: Right Arm)   Pulse 83   Temp 98.2 F (36.8 C) (Oral)   Resp 17   Ht 6' 4"  (1.93 m)   Wt 65.8 kg   SpO2 99%   BMI 17.65 kg/m   Awake, alert, oriented  Speech fluent, appropriate  CN grossly intact  Unable to assess left shoulder, 2/5 bicep, 2/5 tricep, 1-2/5 finger flex/ext 4/5 right delt, 4/5 bicep/tricep, 1-2/5 grip 4+/5 proximal BLE, 4/5 DF Wound c/d/I, 0 Hemovac output  IMPRESSION:  50 y.o. male s/p [facility] with C6 fracture POD# 3 s/p ACDF, T6 fracture requires stabilization. Stable quadriparesis c/w central cord syndrome  PLAN: - Cont Aspen collar - Remain bedrest, HOB up to 30deg - Plan on operative stabilization of T6 fracture this week per Dr. Marcello Moores - D/C hemovac today  Consuella Lose, MD [facility]

## 2021-09-02 NOTE — Progress Notes (Signed)
Patient ID: Carlos Frieze., male   DOB: [birth year], 50 y.o.   MRN: [id number] 3 Days Post-Op   Subjective: Wondering about splints for his hands ROS negative except as listed above. Objective: Vital signs in last 24 hours: Temp:  [97.5 F (36.4 C)-98.3 F (36.8 C)] 98.2 F (36.8 C) (09/18 0703) Pulse Rate:  [83-93] 83 (09/18 0703) Resp:  [13-20] 17 (09/18 0703) BP: (112-132)/(70-80) 120/78 (09/18 0703) SpO2:  [97 %-100 %] 99 % (09/18 0703) Last BM Date:  (PTA)  Intake/Output from previous day: 09/17 0701 - 09/18 0700 In: 2431 [P.O.:240; I.V.:2191] Out: 10 [Chest Tube:10] Intake/Output this shift: No intake/output data recorded.  General appearance: cooperative Resp: clear to auscultation bilaterally Cardio: regular rate and rhythm GI: soft. NT Extremities: no edema Neurologic: Motor: central cord  Lab Results: CBC  Recent Labs    09/01/21 0306 09/02/21 0308  WBC 7.0 5.7  HGB 10.2* 10.1*  HCT 28.8* 27.9*  PLT 219 199   BMET Recent Labs    09/01/21 0306 09/02/21 0308  NA 131* 133*  K 3.8 3.5  CL 101 101  CO2 19* 23  GLUCOSE 215* 195*  BUN 16 8  CREATININE 0.88 0.62  CALCIUM 8.3* 8.1*   PT/INR No results for input(s): LABPROT, INR in the last 72 hours. ABG No results for input(s): PHART, HCO3 in the last 72 hours.  Invalid input(s): PCO2, PO2  Studies/Results: DG CHEST PORT 1 VIEW  Result Date: 09/02/2021 CLINICAL DATA:  Follow-up known left pneumothorax. EXAM: PORTABLE CHEST 1 VIEW COMPARISON:  September 01, 2021 FINDINGS: Left-sided chest tube is stable. A tiny left apical pneumothorax is stable. No right-sided pneumothorax identified. The cardiomediastinal silhouette is normal. The right lung is clear. Numerous left-sided rib fractures are again identified. Haziness over the left base is mildly worsened in the interval. No other acute abnormalities. IMPRESSION: 1. Stable left chest tube and tiny left apical pneumothorax. 2. Numerous stable  left-sided rib fractures. 3. Mild increased haziness in the left base could represent a small layering effusion versus atelectasis. Electronically Signed   By: Dorise Bullion III M.D.   On: 09/02/2021 08:08   DG Chest Port 1 View  Result Date: 09/01/2021 CLINICAL DATA:  Respiratory failure. EXAM: PORTABLE CHEST 1 VIEW COMPARISON:  August 31, 2021 FINDINGS: Left-sided rib fractures are again identified. The left chest tube is stable. A tiny left apical pneumothoraxa remains, unchanged. No other interval changes or acute abnormalities. IMPRESSION: 1. The left chest tube is stable. The tiny left apical pneumothorax is stable. Numerous left-sided rib fractures are stable. Electronically Signed   By: Dorise Bullion III M.D.   On: 09/01/2021 08:14    Anti-infectives: Anti-infectives (From admission, onward)    Start     Dose/Rate Route Frequency Ordered Stop   08/31/21 0300  ceFAZolin (ANCEF) IVPB 2g/100 mL premix        2 g 200 mL/hr over 30 Minutes Intravenous Every 8 hours 08/30/21 2238 08/31/21 1110       Assessment/Plan: 77M [facility]   L rib fxs with PTX - CT placed in ED. RN reports suction was not on overnight. CXR with stable apical PTX, water seal today, CXR in AM. IS/pulm toilet. Multimodal pain control.   L clavicle fx - non-op per Dr. Sammuel Hines, sling L scapula fx - non-op per Dr. Sammuel Hines, sling C6 spinous process and pedical fractures - s/p ACDF 9/15 by Dr. Marcello Moores, okay for [facility] up to 30 degrees Central cord syndrome -  expecting CIR needs, start PT/OT maintaining spine precautions. Some improvement in exam T6 compression fx - continue spine precautions, okay for HOB up to 30 degrees, Fixation planned next week DM - poorly controlled at home, Hba1c 8.4, SSI, has been refusing insulin here FEN: carb mod diet, gabapentin for nerve pain Foley - foley for acute urinary retention, start urecholine, voiding trial after OR (is going to OR Monday) ID: [email or web address], periop ABX VTE: PAS, LMWH  Dispo:  4NP I spoke with his wife at the bedside.  LOS: 4 days    Georganna Skeans, MD, MPH, FACS Trauma & General Surgery Use [email or web address] to contact on call provider  09/02/2021

## 2021-09-02 NOTE — Progress Notes (Signed)
1400  This nurse has removed the patients Hemovac this afternoon. Patient has tolerated the procedure very well. Shelbie Proctor, RN

## 2021-09-03 ENCOUNTER — Inpatient Hospital Stay (HOSPITAL_COMMUNITY): Payer: BC Managed Care – PPO

## 2021-09-03 LAB — BASIC METABOLIC PANEL
Anion gap: 13 (ref 5–15)
BUN: 8 mg/dL (ref 6–20)
CO2: 24 mmol/L (ref 22–32)
Calcium: 8.4 mg/dL — ABNORMAL LOW (ref 8.9–10.3)
Chloride: 96 mmol/L — ABNORMAL LOW (ref 98–111)
Creatinine, Ser: 0.6 mg/dL — ABNORMAL LOW (ref 0.61–1.24)
GFR, Estimated: >60 mL/min (ref >60)
Glucose, Bld: 170 mg/dL — ABNORMAL HIGH (ref 70–99)
Potassium: 3.3 mmol/L — ABNORMAL LOW (ref 3.5–5.1)
Sodium: 133 mmol/L — ABNORMAL LOW (ref 135–145)

## 2021-09-03 LAB — CBC
HCT: 29.3 % — ABNORMAL LOW (ref 39.0–52.0)
Hemoglobin: 10.7 g/dL — ABNORMAL LOW (ref 13.0–17.0)
MCH: 32.7 pg (ref 26.0–34.0)
MCHC: 36.5 g/dL — ABNORMAL HIGH (ref 30.0–36.0)
MCV: 89.6 fL (ref 80.0–100.0)
Platelets: 234 10*3/uL (ref 150–400)
RBC: 3.27 MIL/uL — ABNORMAL LOW (ref 4.22–5.81)
RDW: 11.9 % (ref 11.5–15.5)
WBC: 6.3 10*3/uL (ref 4.0–10.5)
nRBC: 0 % (ref 0.0–0.2)

## 2021-09-03 LAB — GLUCOSE, CAPILLARY
Glucose-Capillary: 169 mg/dL — ABNORMAL HIGH (ref 70–99)
Glucose-Capillary: 156 mg/dL — ABNORMAL HIGH (ref 70–99)
Glucose-Capillary: 152 mg/dL — ABNORMAL HIGH (ref 70–99)
Glucose-Capillary: 162 mg/dL — ABNORMAL HIGH (ref 70–99)
Glucose-Capillary: 227 mg/dL — ABNORMAL HIGH (ref 70–99)

## 2021-09-03 IMAGING — DX DG CHEST 1V PORT
2 series · 2 of 2 positions shown · non-contrast
Comparison: Earlier today.

CLINICAL DATA: Pneumothorax on left. Status post removal of
left-sided chest tube.

EXAM:
PORTABLE CHEST 1 VIEW

[chest ap (1 of 2)]
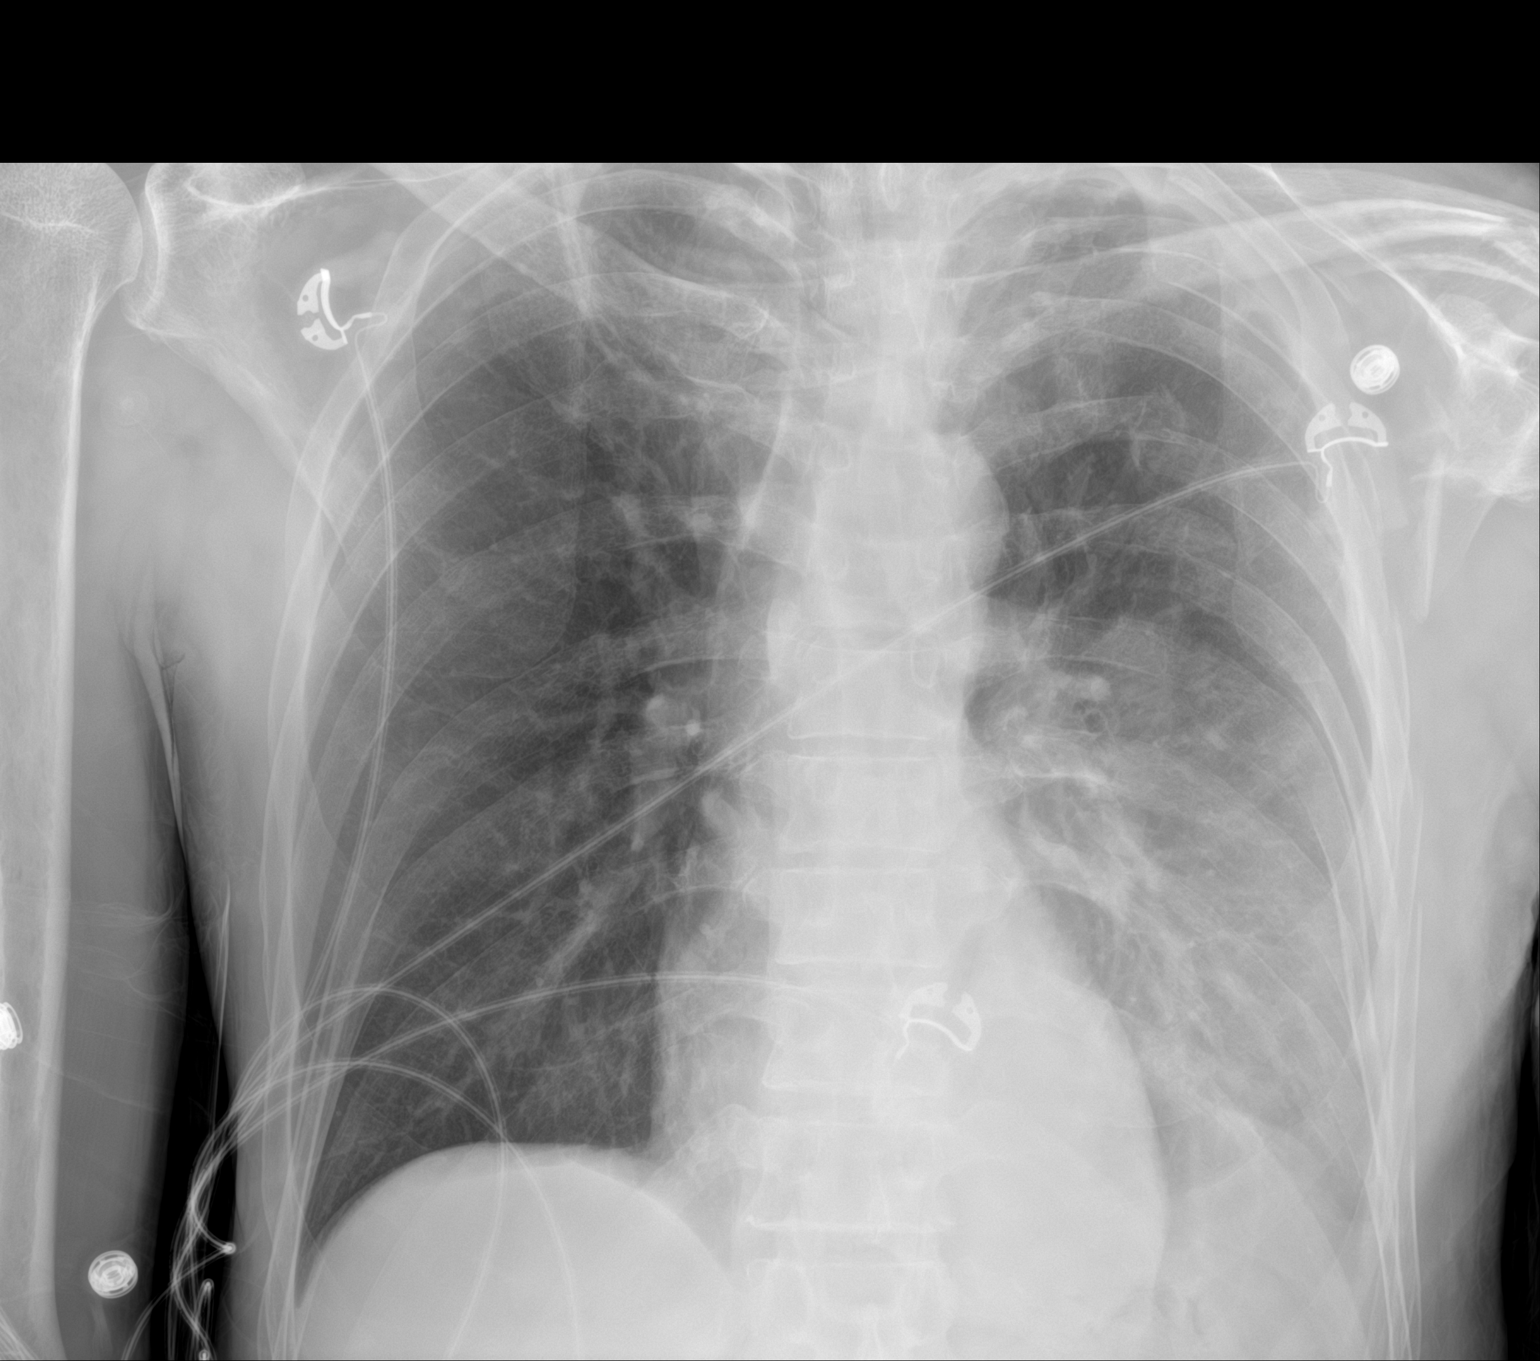

[chest ap (2 of 2)]
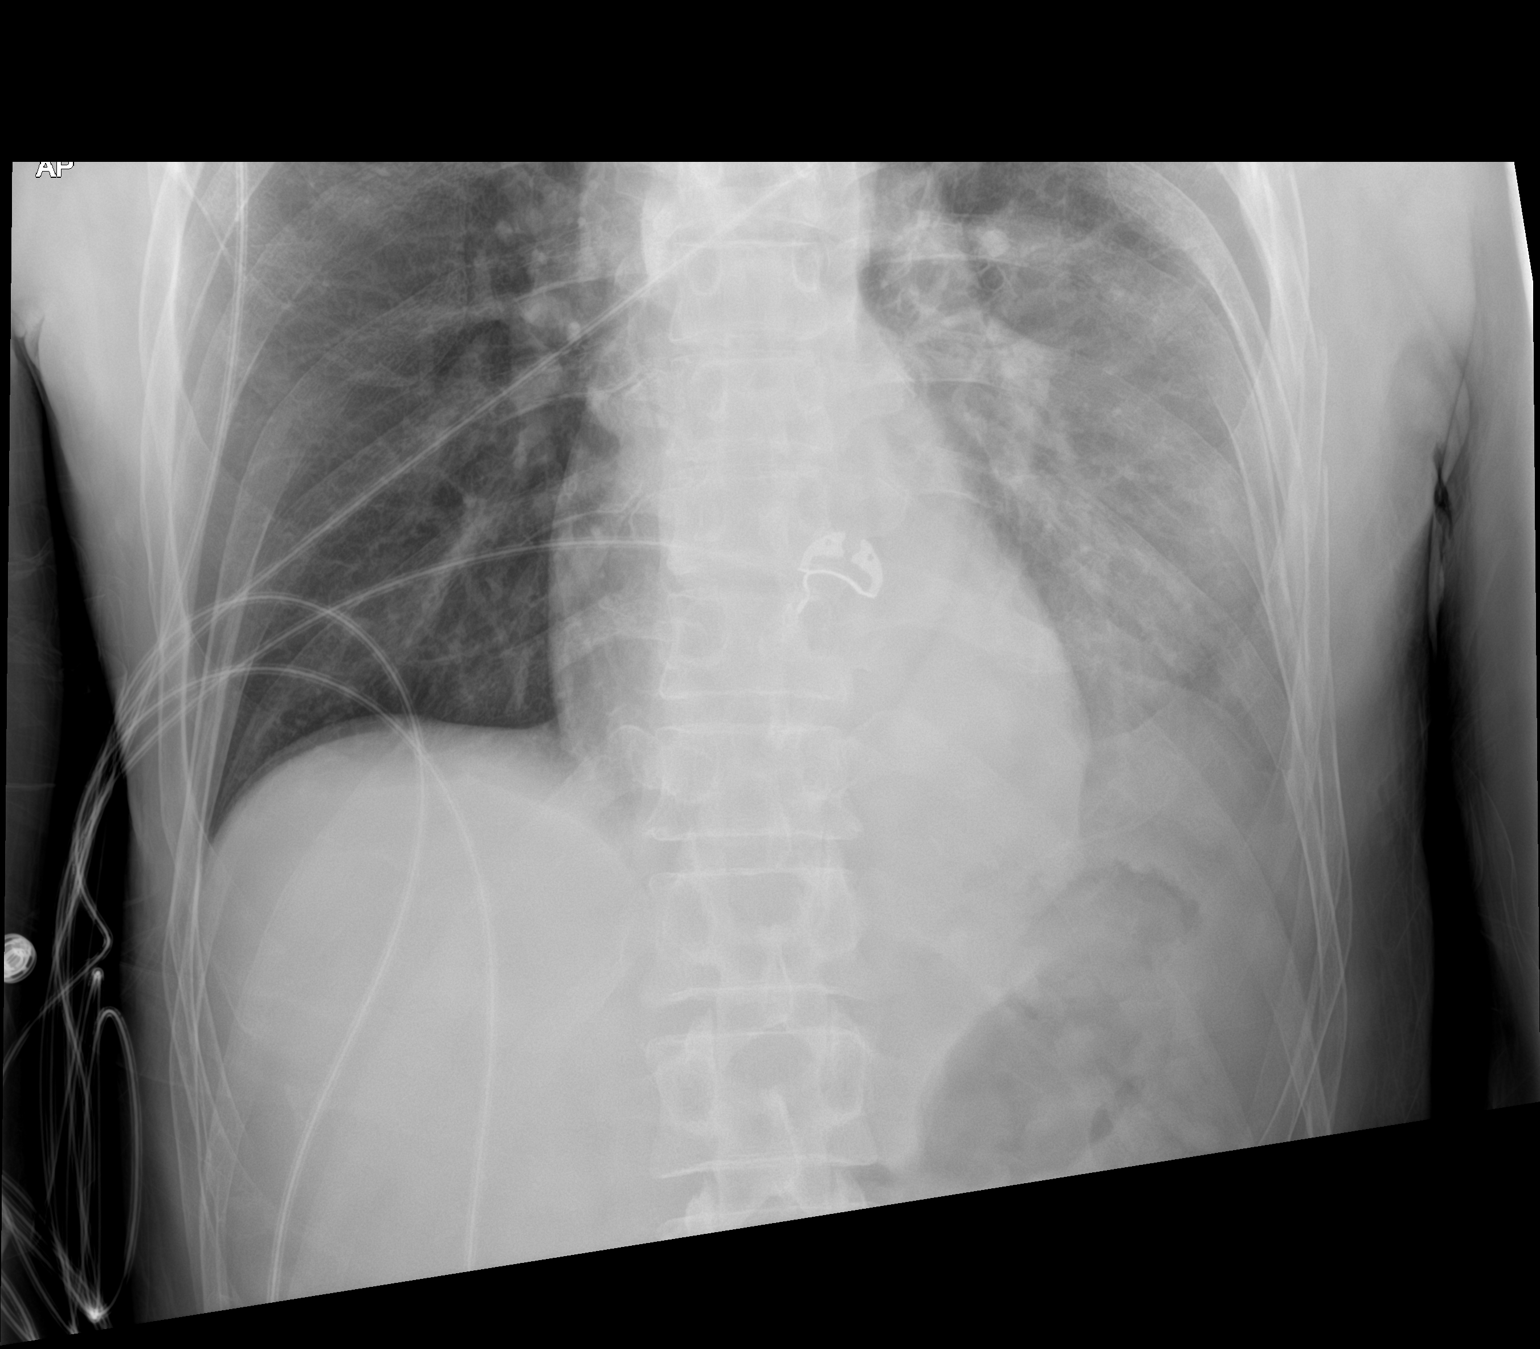

[2 of 2 positions shown; findings below may reference images not displayed]

FINDINGS: Left pigtail catheter has been removed. The previous small left
pneumothorax is not currently seen. Hazy opacity in the left
hemithorax likely represents combination of atelectasis and layering
pleural fluid. Multiple displaced left rib fractures. Stable heart
size. The right lung is clear.
IMPRESSION: 1. Removal of left pigtail catheter. The previous small left
pneumothorax is not currently seen.
2. Hazy opacity in the left hemithorax likely represents combination
of atelectasis and layering pleural fluid, mildly increased from
earlier today.

## 2021-09-03 IMAGING — DX DG CHEST 1V PORT
1 series · 1 of 1 positions shown · non-contrast
Comparison: [DATE]

CLINICAL DATA: Pneumothorax, left chest tube, rib fractures

EXAM:
PORTABLE CHEST 1 VIEW

[chest ap]
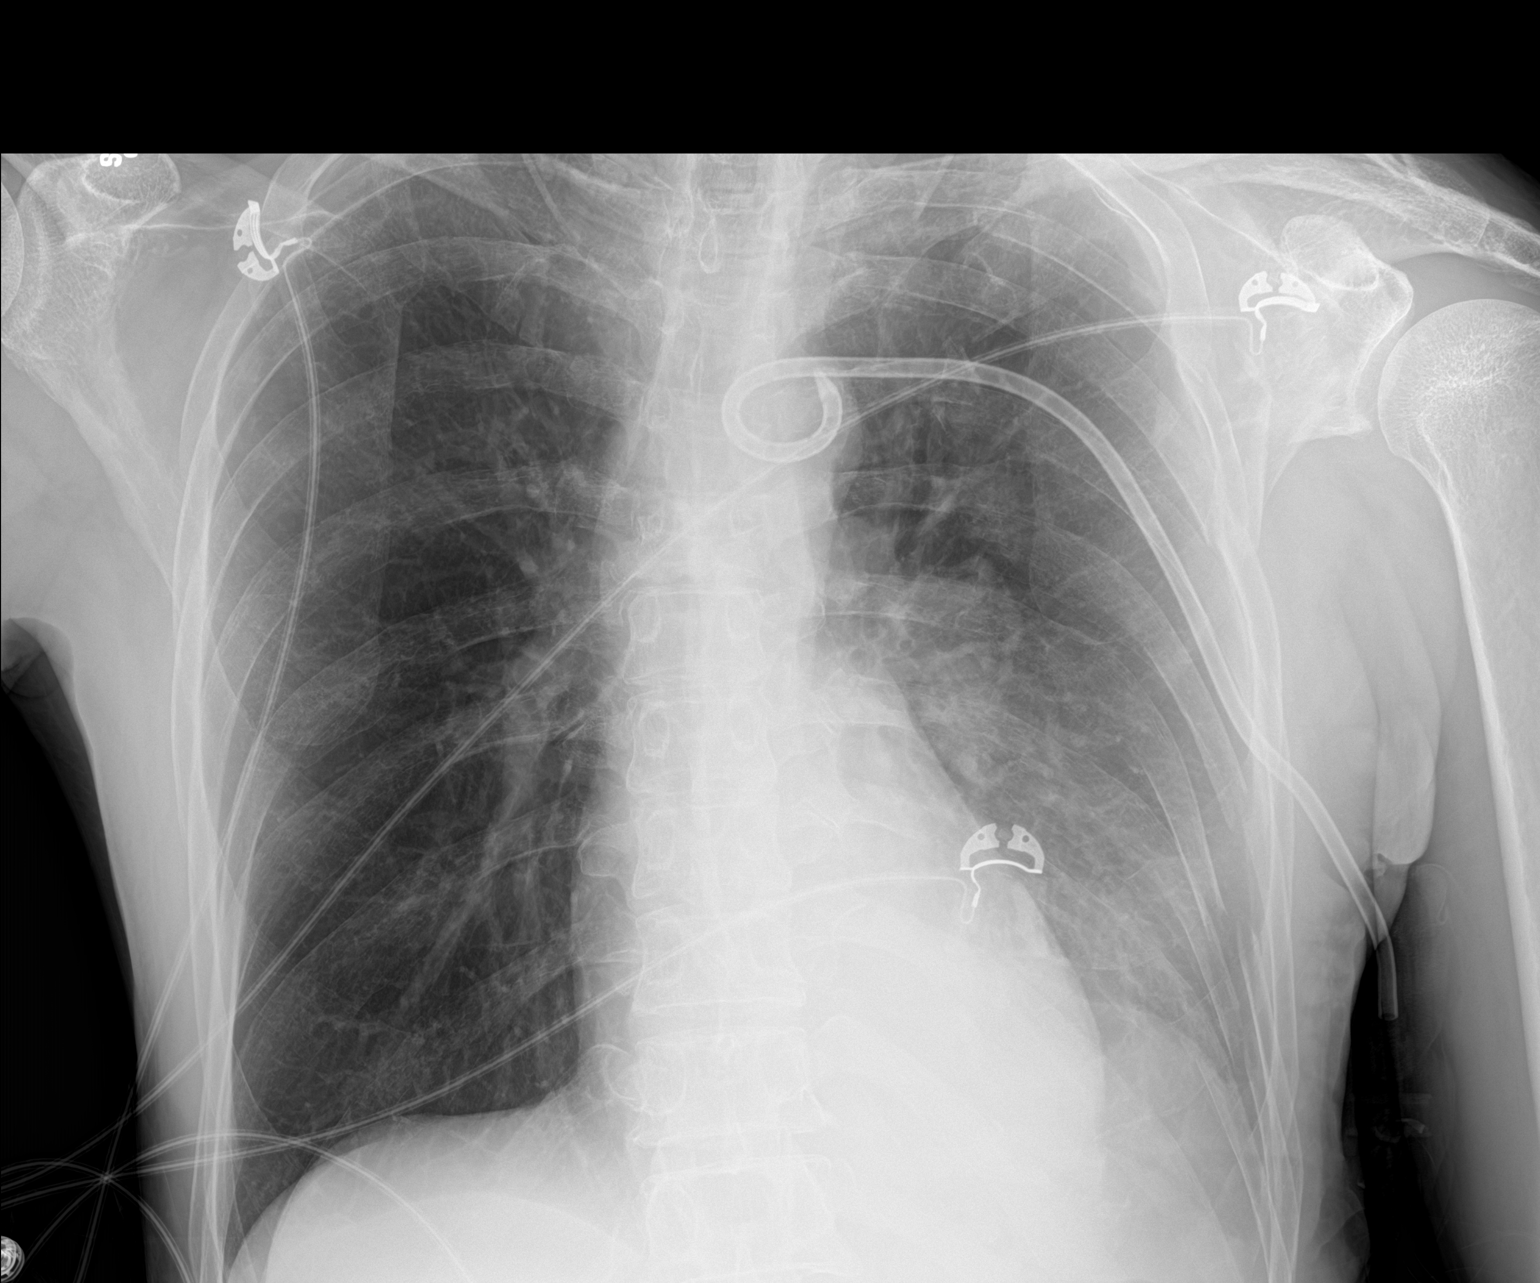

[1 of 1 positions shown; findings below may reference images not displayed]

FINDINGS: Unchanged position of medially directed left chest tube. Small left
pneumothorax remains present. Patchy density in the left lung as
before. Normal heart size. Displaced left rib fractures.
IMPRESSION: Left chest tube present with small pneumothorax.

Patchy left lung density may reflect atelectasis and/or layering
pleural effusion.

## 2021-09-03 NOTE — Progress Notes (Signed)
Patient ID: Carlos [name]., male   DOB: December 06, 1971, 50 y.o.   MRN: [id number] 4 Days Post-Op   Subjective: No new complaints ROS negative except as listed above. Objective: Vital signs in last 24 hours: Temp:  [97.7 F (36.5 C)-98.6 F (37 C)] 97.7 F (36.5 C) (09/19 0757) Pulse Rate:  [79-97] 92 (09/19 0500) Resp:  [14-20] 20 (09/19 0500) BP: (130-146)/(85-97) 130/85 (09/19 0346) SpO2:  [98 %-100 %] 100 % (09/19 0500) Last BM Date:  (PTA)  Intake/Output from previous day: 09/18 0701 - 09/19 0700 In: 290 [P.O.:240; IV Piggyback:50] Out: 3100 [Urine:3100] Intake/Output this shift: No intake/output data recorded.  General appearance: cooperative Resp: clear to auscultation bilaterally Chest wall: left sided chest wall tenderness, L cheat tube Cardio: regular rate and rhythm GI: soft, non-tender; bowel sounds normal; no masses,  no organomegaly Extremities: calves soft Neurologic: Motor: stable central cord exam  Lab Results: CBC  Recent Labs    09/02/21 0308 09/03/21 0719  WBC 5.7 6.3  HGB 10.1* 10.7*  HCT 27.9* 29.3*  PLT 199 234   BMET Recent Labs    09/02/21 0308 09/03/21 0719  NA 133* 133*  K 3.5 3.3*  CL 101 96*  CO2 23 24  GLUCOSE 195* 170*  BUN 8 8  CREATININE 0.62 0.60*  CALCIUM 8.1* 8.4*   PT/INR No results for input(s): LABPROT, INR in the last 72 hours. ABG No results for input(s): PHART, HCO3 in the last 72 hours.  Invalid input(s): PCO2, PO2  Studies/Results: DG CHEST PORT 1 VIEW  Result Date: 09/03/2021 CLINICAL DATA:  Pneumothorax, left chest tube, rib fractures EXAM: PORTABLE CHEST 1 VIEW COMPARISON:  09/02/2021 FINDINGS: Unchanged position of medially directed left chest tube. Small left pneumothorax remains present. Patchy density in the left lung as before. Normal heart size. Displaced left rib fractures. IMPRESSION: Left chest tube present with small pneumothorax. Patchy left lung density may reflect atelectasis and/or layering  pleural effusion. Electronically Signed   By: Macy Mis M.D.   On: 09/03/2021 08:15   DG CHEST PORT 1 VIEW  Result Date: 09/02/2021 CLINICAL DATA:  Follow-up known left pneumothorax. EXAM: PORTABLE CHEST 1 VIEW COMPARISON:  September 01, 2021 FINDINGS: Left-sided chest tube is stable. A tiny left apical pneumothorax is stable. No right-sided pneumothorax identified. The cardiomediastinal silhouette is normal. The right lung is clear. Numerous left-sided rib fractures are again identified. Haziness over the left base is mildly worsened in the interval. No other acute abnormalities. IMPRESSION: 1. Stable left chest tube and tiny left apical pneumothorax. 2. Numerous stable left-sided rib fractures. 3. Mild increased haziness in the left base could represent a small layering effusion versus atelectasis. Electronically Signed   By: Dorise Bullion III M.D.   On: 09/02/2021 08:08    Anti-infectives: Anti-infectives (From admission, onward)    Start     Dose/Rate Route Frequency Ordered Stop   08/31/21 0300  ceFAZolin (ANCEF) IVPB 2g/100 mL premix        2 g 200 mL/hr over 30 Minutes Intravenous Every 8 hours 08/30/21 2238 08/31/21 1110       Assessment/Plan: 40M [facility]   L rib fxs with PTX - CT placed in ED. On water seal. CXR with stable apical PTX, D/C chest tube L clavicle fx - non-op per Dr. Sammuel Hines, sling L scapula fx - non-op per Dr. Sammuel Hines, sling C6 spinous process and pedical fractures - s/p ACDF 9/15 by Dr. Marcello Moores, okay for [facility] up to 30 degrees  Central cord syndrome - expecting CIR needs, PT/OT maintaining spine precautions. Some improvement in exam T6 compression fx - continue spine precautions, okay for HOB up to 30 degrees, Fixation planned possibly tomorrow DM - poorly controlled at home, Hba1c 8.4, SSI, has been refusing insulin here FEN: carb mod diet, gabapentin for nerve pain Foley - foley for acute urinary retention, start urecholine, voiding trial after OR ID: [email or web address],  periop ABX VTE: PAS, LMWH  Dispo: 4NP   LOS: 5 days    Georganna Skeans, MD, MPH, FACS Trauma & General Surgery Use [email or web address] to contact on call provider  09/03/2021

## 2021-09-03 NOTE — Plan of Care (Signed)

## 2021-09-03 NOTE — Progress Notes (Signed)
1140 This nurse has removed the patients Chest Tube and the patient has tolerated this well. Small amount of blood in tubing drains into chest tube device.  Shelbie Proctor, RN

## 2021-09-03 NOTE — Progress Notes (Signed)
Inpatient Rehab Admissions Coordinator Note:   Per OT patient was screened for CIR candidacy by Rosibel Giacobbe Danford Bad, CCC-SLP. At this time, pt unable to participate in complete PT evaluation/tx d/t pending posterior fixation of T5-T8 tomorrow. CIR admissions team will await therapy reassessments after surgery to help determine if pt appears to be an appropriate CIR candidate.    Gayland Curry, [place], [place] Admissions Coordinator [phone] 09/03/21 3:05 PM
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
## 2021-09-04 ENCOUNTER — Inpatient Hospital Stay (HOSPITAL_COMMUNITY): Payer: BC Managed Care – PPO

## 2021-09-04 ENCOUNTER — Encounter (HOSPITAL_COMMUNITY): Admission: EM | Disposition: A | Discharge status: Rehabilitation Facility | Payer: Self-pay | Source: Home / Self Care

## 2021-09-04 ENCOUNTER — Encounter (HOSPITAL_COMMUNITY): Payer: Self-pay

## 2021-09-04 HISTORY — PX: LUMBAR PERCUTANEOUS PEDICLE SCREW 4 LEVEL: SHX6318

## 2021-09-04 LAB — GLUCOSE, CAPILLARY
Glucose-Capillary: 172 mg/dL — ABNORMAL HIGH (ref 70–99)
Glucose-Capillary: 139 mg/dL — ABNORMAL HIGH (ref 70–99)
Glucose-Capillary: 165 mg/dL — ABNORMAL HIGH (ref 70–99)
Glucose-Capillary: 201 mg/dL — ABNORMAL HIGH (ref 70–99)

## 2021-09-04 LAB — TYPE AND SCREEN
ABO/RH(D): B POS
Antibody Screen: NEGATIVE

## 2021-09-04 LAB — BASIC METABOLIC PANEL
Anion gap: 10 (ref 5–15)
BUN: 9 mg/dL (ref 6–20)
CO2: 27 mmol/L (ref 22–32)
Calcium: 8.5 mg/dL — ABNORMAL LOW (ref 8.9–10.3)
Chloride: 95 mmol/L — ABNORMAL LOW (ref 98–111)
Creatinine, Ser: 0.51 mg/dL — ABNORMAL LOW (ref 0.61–1.24)
GFR, Estimated: >60 mL/min (ref >60)
Glucose, Bld: 183 mg/dL — ABNORMAL HIGH (ref 70–99)
Potassium: 3.5 mmol/L (ref 3.5–5.1)
Sodium: 132 mmol/L — ABNORMAL LOW (ref 135–145)

## 2021-09-04 LAB — CBC
HCT: 28.7 % — ABNORMAL LOW (ref 39.0–52.0)
Hemoglobin: 10.6 g/dL — ABNORMAL LOW (ref 13.0–17.0)
MCH: 32.6 pg (ref 26.0–34.0)
MCHC: 36.9 g/dL — ABNORMAL HIGH (ref 30.0–36.0)
MCV: 88.3 fL (ref 80.0–100.0)
Platelets: 242 10*3/uL (ref 150–400)
RBC: 3.25 MIL/uL — ABNORMAL LOW (ref 4.22–5.81)
RDW: 11.9 % (ref 11.5–15.5)
WBC: 6.1 10*3/uL (ref 4.0–10.5)
nRBC: 0 % (ref 0.0–0.2)

## 2021-09-04 IMAGING — RF DG THORACIC SPINE 2V
1 series · 4 of 4 positions shown · non-contrast
Comparison: MRI from [DATE]

CLINICAL DATA: T6 compression fracture

EXAM:
THORACIC SPINE 2 VIEWS

[Series 1: run · 4 of 4 slices shown]
[im 1/4]
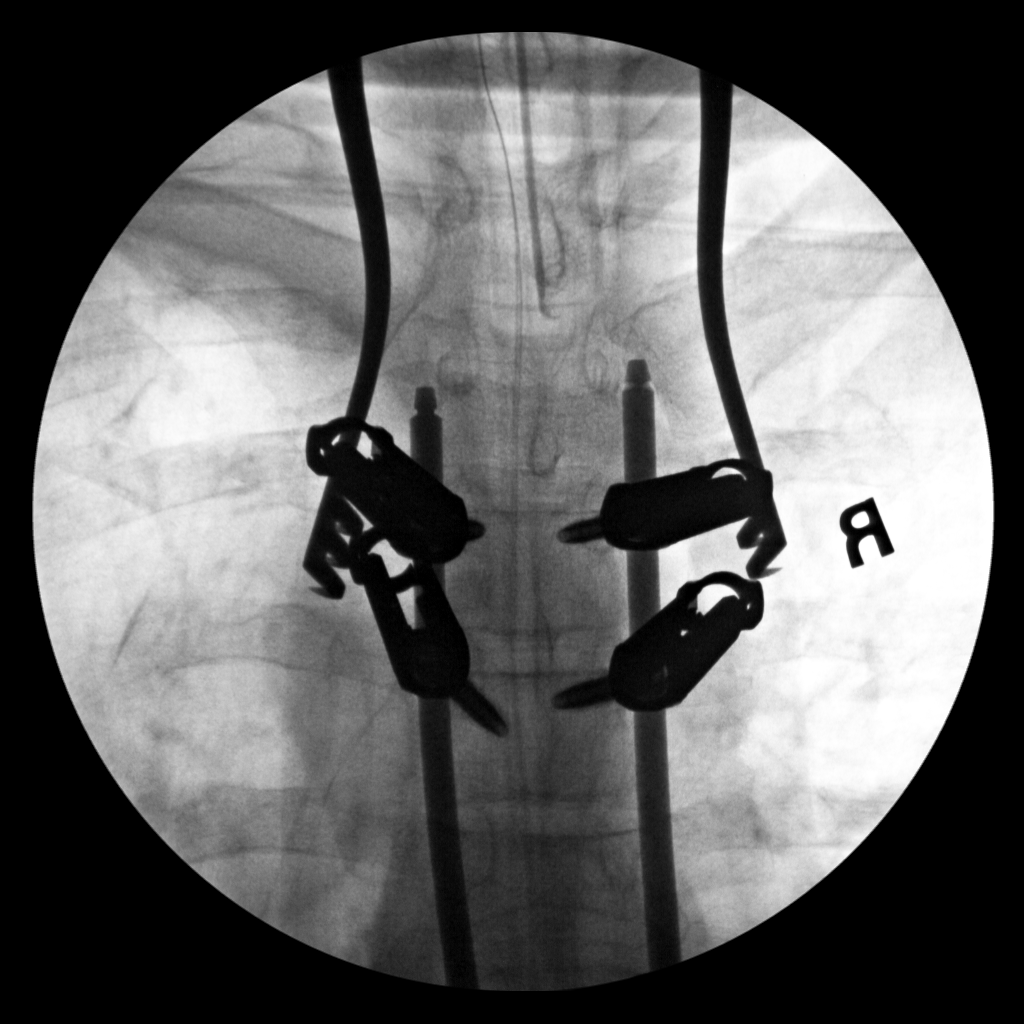
[im 2/4]
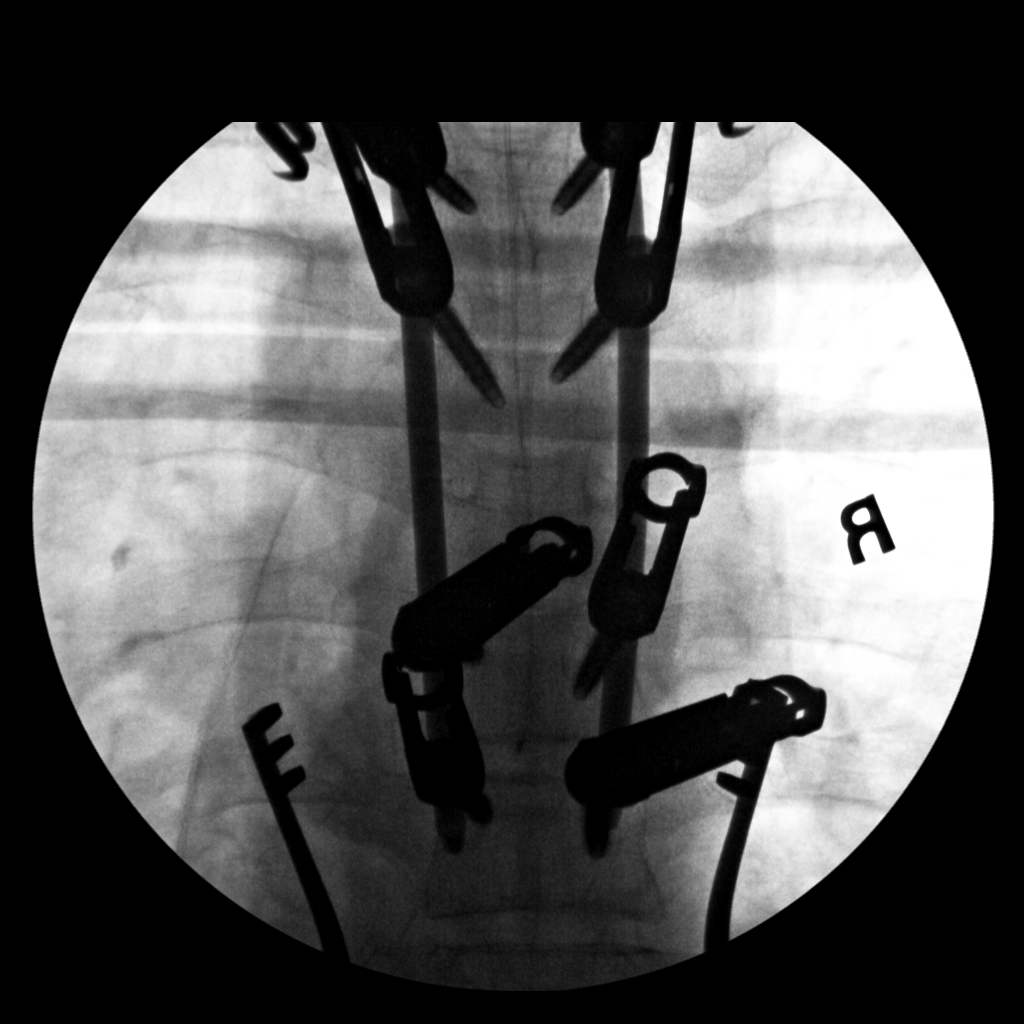
[im 3/4]
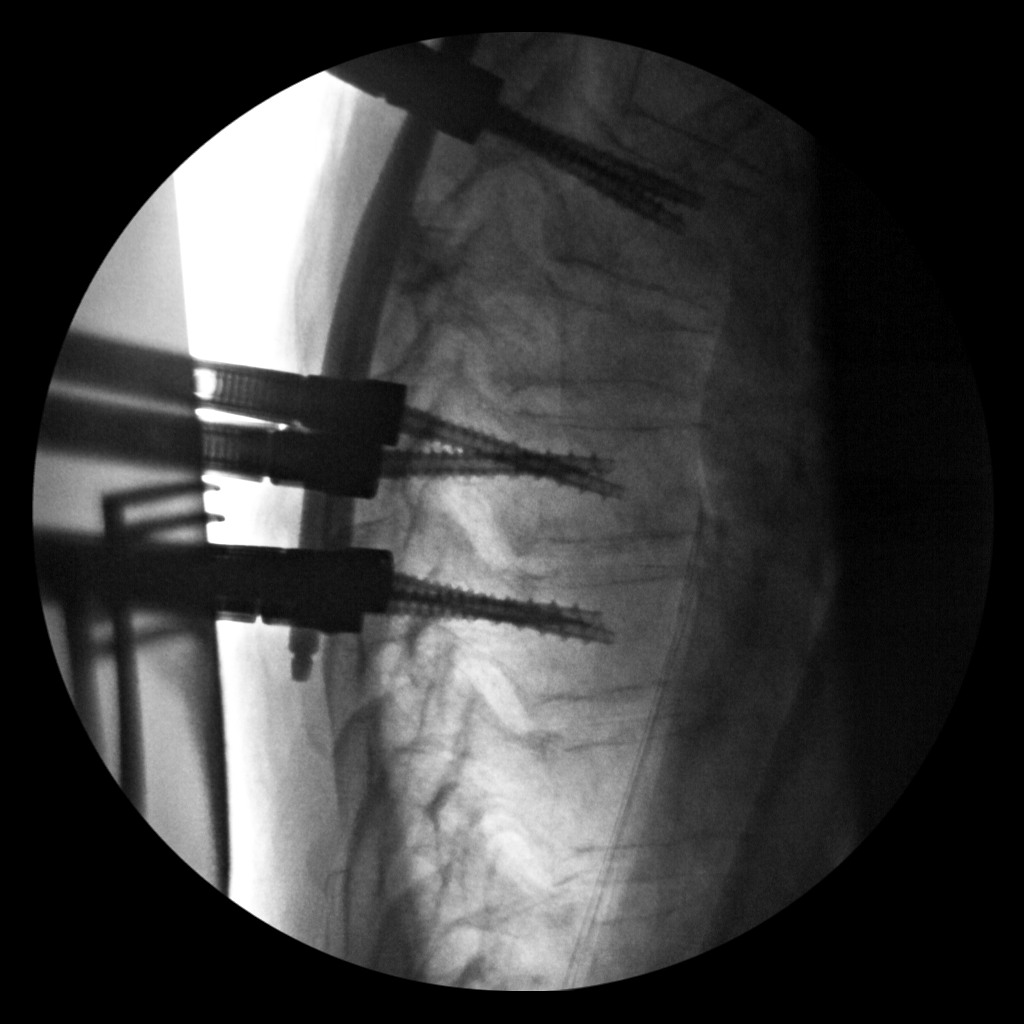
[im 4/4]
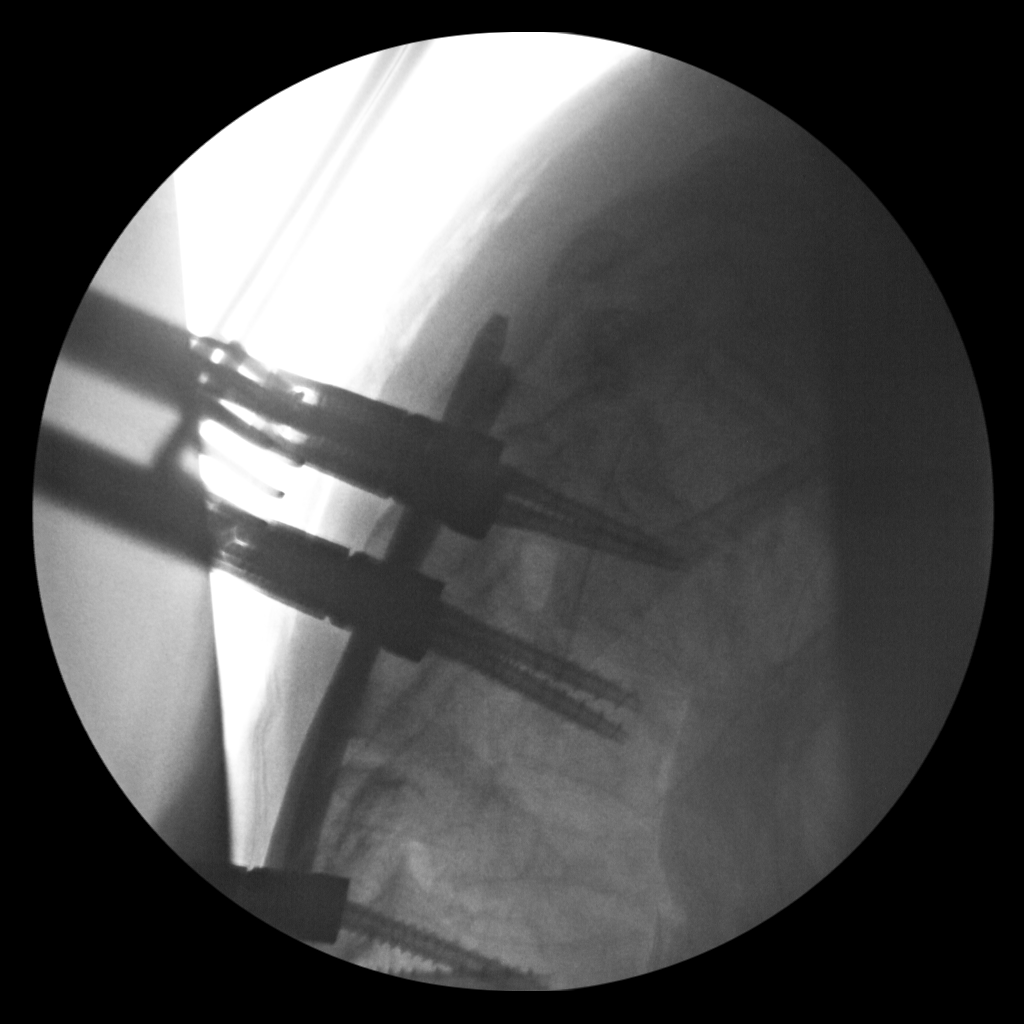

[4 of 4 positions shown; findings below may reference images not displayed]

FLUOROSCOPY TIME:  Radiation Exposure Index (as provided by the
fluoroscopic device): 35.6 mGy

If the device does not provide the exposure index:

Fluoroscopy Time:  2 minutes 51 seconds

Number of Acquired Images:  4
FINDINGS: Images demonstrate pedicle screw placement at T4, T5, T7 and T8 with
posterior fixation. T6 compression fracture is again noted.
IMPRESSION: Posterior fixation in the thoracic spine for underlying T6
compression fracture.

## 2021-09-04 SURGERY — LUMBAR PERCUTANEOUS PEDICLE SCREW 4 LEVEL
Anesthesia: General | Site: Spine Thoracic

## 2021-09-04 MED ORDER — ENOXAPARIN SODIUM 40 MG/0.4ML IJ SOSY: 40.0000 mg | PREFILLED_SYRINGE | INTRAMUSCULAR | Status: DC

## 2021-09-04 MED ORDER — CHLORHEXIDINE GLUCONATE 0.12 % MT SOLN: 15.0000 mL | Freq: Once | OROMUCOSAL | Status: DC

## 2021-09-04 MED ORDER — PHENOL 1.4 % MT LIQD: 1.0000 | OROMUCOSAL | Status: DC | PRN

## 2021-09-04 MED ORDER — ASPIRIN EC 81 MG PO TBEC
81.0000 mg | DELAYED_RELEASE_TABLET | Freq: Every day | ORAL | Status: DC
  Administered 2021-09-07 – 2021-09-13 (×7): 81 mg via ORAL
  Filled 2021-09-04 (×7): qty 1

## 2021-09-04 MED ORDER — PROPOFOL 10 MG/ML IV BOLUS
INTRAVENOUS | Status: DC | PRN
  Administered 2021-09-04: 130 mg via INTRAVENOUS

## 2021-09-04 MED ORDER — ALPRAZOLAM 0.25 MG PO TABS
0.2500 mg | ORAL_TABLET | Freq: Two times a day (BID) | ORAL | Status: DC | PRN
  Administered 2021-09-04 – 2021-09-12 (×18): 0.25 mg via ORAL
  Filled 2021-09-04 (×19): qty 1

## 2021-09-04 MED ORDER — MIDAZOLAM HCL 2 MG/2ML IJ SOLN
INTRAMUSCULAR | Status: DC | PRN
  Administered 2021-09-04: 2 mg via INTRAVENOUS

## 2021-09-04 MED ORDER — MIDAZOLAM HCL 2 MG/2ML IJ SOLN
INTRAMUSCULAR | Status: AC
  Filled 2021-09-04: qty 2

## 2021-09-04 MED ORDER — HYDROMORPHONE HCL 1 MG/ML IJ SOLN
0.5000 mg | INTRAMUSCULAR | Status: DC | PRN
  Administered 2021-09-04 – 2021-09-05 (×3): 0.5 mg via INTRAVENOUS
  Filled 2021-09-04 (×3): qty 1

## 2021-09-04 MED ORDER — TRAMADOL HCL 50 MG PO TABS
50.0000 mg | ORAL_TABLET | Freq: Four times a day (QID) | ORAL | Status: DC
Start: 2021-09-04 — End: 2021-09-13
  Administered 2021-09-04 – 2021-09-13 (×35): 50 mg via ORAL
  Filled 2021-09-04 (×35): qty 1

## 2021-09-04 MED ORDER — FENTANYL CITRATE (PF) 250 MCG/5ML IJ SOLN
INTRAMUSCULAR | Status: DC | PRN
  Administered 2021-09-04 (×2): 100 ug via INTRAVENOUS
  Administered 2021-09-04: 50 ug via INTRAVENOUS

## 2021-09-04 MED ORDER — LIDOCAINE 2% (20 MG/ML) 5 ML SYRINGE
INTRAMUSCULAR | Status: AC
  Filled 2021-09-04: qty 5

## 2021-09-04 MED ORDER — ONDANSETRON HCL 4 MG/2ML IJ SOLN
INTRAMUSCULAR | Status: AC
  Filled 2021-09-04: qty 2

## 2021-09-04 MED ORDER — BUPIVACAINE LIPOSOME 1.3 % IJ SUSP
INTRAMUSCULAR | Status: AC
  Filled 2021-09-04: qty 20

## 2021-09-04 MED ORDER — ENOXAPARIN SODIUM 30 MG/0.3ML IJ SOSY
30.0000 mg | PREFILLED_SYRINGE | Freq: Two times a day (BID) | INTRAMUSCULAR | Status: DC
  Administered 2021-09-05 – 2021-09-13 (×17): 30 mg via SUBCUTANEOUS
  Filled 2021-09-04 (×18): qty 0.3

## 2021-09-04 MED ORDER — LIDOCAINE-EPINEPHRINE 1 %-1:100000 IJ SOLN
INTRAMUSCULAR | Status: DC | PRN
  Administered 2021-09-04: 10 mL

## 2021-09-04 MED ORDER — EPHEDRINE SULFATE-NACL 50-0.9 MG/10ML-% IV SOSY
PREFILLED_SYRINGE | INTRAVENOUS | Status: DC | PRN
  Administered 2021-09-04: 10 mg via INTRAVENOUS

## 2021-09-04 MED ORDER — OXYCODONE HCL 5 MG/5ML PO SOLN
5.0000 mg | Freq: Once | ORAL | Status: DC | PRN
Start: 2021-09-04 — End: 2021-09-04

## 2021-09-04 MED ORDER — ACETAMINOPHEN 650 MG RE SUPP: 650.0000 mg | RECTAL | Status: DC | PRN

## 2021-09-04 MED ORDER — GEMFIBROZIL 600 MG PO TABS
600.0000 mg | ORAL_TABLET | Freq: Two times a day (BID) | ORAL | Status: DC
  Administered 2021-09-05 – 2021-09-13 (×17): 600 mg via ORAL
  Filled 2021-09-04 (×19): qty 1

## 2021-09-04 MED ORDER — LIDOCAINE 2% (20 MG/ML) 5 ML SYRINGE
INTRAMUSCULAR | Status: DC | PRN
  Administered 2021-09-04: 45 mg via INTRAVENOUS

## 2021-09-04 MED ORDER — SODIUM CHLORIDE 0.9 % IV SOLN: 250.0000 mL | INTRAVENOUS | Status: DC

## 2021-09-04 MED ORDER — FENTANYL CITRATE (PF) 250 MCG/5ML IJ SOLN
INTRAMUSCULAR | Status: AC
  Filled 2021-09-04: qty 5

## 2021-09-04 MED ORDER — BACITRACIN ZINC 500 UNIT/GM EX OINT
TOPICAL_OINTMENT | CUTANEOUS | Status: AC
  Filled 2021-09-04: qty 28.35

## 2021-09-04 MED ORDER — ACETAMINOPHEN 500 MG PO TABS
1000.0000 mg | ORAL_TABLET | Freq: Four times a day (QID) | ORAL | Status: DC
  Administered 2021-09-04: 1000 mg via ORAL
  Filled 2021-09-04: qty 2

## 2021-09-04 MED ORDER — PHENYLEPHRINE HCL-NACL 20-0.9 MG/250ML-% IV SOLN
INTRAVENOUS | Status: DC | PRN
  Administered 2021-09-04: 20 ug/min via INTRAVENOUS

## 2021-09-04 MED ORDER — OXYCODONE HCL 5 MG PO TABS: 5.0000 mg | ORAL_TABLET | Freq: Once | ORAL | Status: DC | PRN

## 2021-09-04 MED ORDER — ACETAMINOPHEN 325 MG PO TABS: 325.0000 mg | ORAL_TABLET | Freq: Four times a day (QID) | ORAL | Status: DC | PRN

## 2021-09-04 MED ORDER — SODIUM CHLORIDE 0.9% FLUSH
3.0000 mL | Freq: Two times a day (BID) | INTRAVENOUS | Status: DC
  Administered 2021-09-04 – 2021-09-11 (×13): 3 mL via INTRAVENOUS

## 2021-09-04 MED ORDER — THROMBIN 5000 UNITS EX SOLR: OROMUCOSAL | Status: DC | PRN

## 2021-09-04 MED ORDER — BUPIVACAINE LIPOSOME 1.3 % IJ SUSP
INTRAMUSCULAR | Status: DC | PRN
  Administered 2021-09-04: 50 mL

## 2021-09-04 MED ORDER — POTASSIUM CHLORIDE IN NACL 20-0.9 MEQ/L-% IV SOLN
INTRAVENOUS | Status: DC
  Filled 2021-09-04: qty 1000

## 2021-09-04 MED ORDER — BUPIVACAINE HCL (PF) 0.5 % IJ SOLN
INTRAMUSCULAR | Status: AC
  Filled 2021-09-04: qty 30

## 2021-09-04 MED ORDER — FENTANYL CITRATE (PF) 100 MCG/2ML IJ SOLN
100.0000 ug | Freq: Once | INTRAMUSCULAR | Status: AC
  Filled 2021-09-04: qty 2

## 2021-09-04 MED ORDER — THROMBIN 5000 UNITS EX SOLR
CUTANEOUS | Status: AC
  Filled 2021-09-04: qty 5000

## 2021-09-04 MED ORDER — FENTANYL CITRATE (PF) 100 MCG/2ML IJ SOLN
25.0000 ug | INTRAMUSCULAR | Status: DC | PRN
  Administered 2021-09-04: 50 ug via INTRAVENOUS

## 2021-09-04 MED ORDER — OXYCODONE HCL 5 MG PO TABS: 5.0000 mg | ORAL_TABLET | ORAL | Status: DC | PRN

## 2021-09-04 MED ORDER — SODIUM CHLORIDE 0.9% FLUSH: 3.0000 mL | INTRAVENOUS | Status: DC | PRN

## 2021-09-04 MED ORDER — PHENYLEPHRINE 40 MCG/ML (10ML) SYRINGE FOR IV PUSH (FOR BLOOD PRESSURE SUPPORT)
PREFILLED_SYRINGE | INTRAVENOUS | Status: DC | PRN
  Administered 2021-09-04: 80 ug via INTRAVENOUS
  Administered 2021-09-04 (×2): 160 ug via INTRAVENOUS

## 2021-09-04 MED ORDER — SUCCINYLCHOLINE CHLORIDE 200 MG/10ML IV SOSY
PREFILLED_SYRINGE | INTRAVENOUS | Status: AC
  Filled 2021-09-04: qty 10

## 2021-09-04 MED ORDER — ONDANSETRON HCL 4 MG PO TABS: 4.0000 mg | ORAL_TABLET | Freq: Four times a day (QID) | ORAL | Status: DC | PRN

## 2021-09-04 MED ORDER — HYDROCODONE-ACETAMINOPHEN 7.5-325 MG PO TABS
1.0000 | ORAL_TABLET | Freq: Four times a day (QID) | ORAL | Status: DC | PRN
  Administered 2021-09-04 – 2021-09-05 (×3): 2 via ORAL
  Filled 2021-09-04 (×3): qty 2

## 2021-09-04 MED ORDER — CEFAZOLIN SODIUM-DEXTROSE 1-4 GM/50ML-% IV SOLN
1.0000 g | Freq: Three times a day (TID) | INTRAVENOUS | Status: AC
  Administered 2021-09-04 – 2021-09-05 (×3): 1 g via INTRAVENOUS
  Filled 2021-09-04 (×3): qty 50

## 2021-09-04 MED ORDER — ONDANSETRON HCL 4 MG/2ML IJ SOLN: 4.0000 mg | Freq: Four times a day (QID) | INTRAMUSCULAR | Status: DC | PRN

## 2021-09-04 MED ORDER — FENTANYL CITRATE (PF) 100 MCG/2ML IJ SOLN: 25.0000 ug | INTRAMUSCULAR | Status: DC | PRN

## 2021-09-04 MED ORDER — PROPOFOL 10 MG/ML IV BOLUS
INTRAVENOUS | Status: AC
  Filled 2021-09-04: qty 20

## 2021-09-04 MED ORDER — EPHEDRINE 5 MG/ML INJ
INTRAVENOUS | Status: AC
  Filled 2021-09-04: qty 5

## 2021-09-04 MED ORDER — EMPAGLIFLOZIN 10 MG PO TABS
10.0000 mg | ORAL_TABLET | Freq: Every day | ORAL | Status: DC
  Administered 2021-09-05 – 2021-09-13 (×9): 10 mg via ORAL
  Filled 2021-09-04 (×10): qty 1

## 2021-09-04 MED ORDER — LIDOCAINE-EPINEPHRINE 1 %-1:100000 IJ SOLN
INTRAMUSCULAR | Status: AC
  Filled 2021-09-04: qty 1

## 2021-09-04 MED ORDER — SUGAMMADEX SODIUM 200 MG/2ML IV SOLN
INTRAVENOUS | Status: DC | PRN
Start: 2021-09-04 — End: 2021-09-04
  Administered 2021-09-04: 200 mg via INTRAVENOUS

## 2021-09-04 MED ORDER — METFORMIN HCL 500 MG PO TABS
1000.0000 mg | ORAL_TABLET | Freq: Two times a day (BID) | ORAL | Status: DC
  Administered 2021-09-05 – 2021-09-13 (×17): 1000 mg via ORAL
  Filled 2021-09-04 (×17): qty 2

## 2021-09-04 MED ORDER — LACTATED RINGERS IV SOLN: INTRAVENOUS | Status: DC

## 2021-09-04 MED ORDER — PHENYLEPHRINE 40 MCG/ML (10ML) SYRINGE FOR IV PUSH (FOR BLOOD PRESSURE SUPPORT)
PREFILLED_SYRINGE | INTRAVENOUS | Status: AC
  Filled 2021-09-04: qty 10

## 2021-09-04 MED ORDER — FENTANYL CITRATE (PF) 100 MCG/2ML IJ SOLN
INTRAMUSCULAR | Status: AC
  Administered 2021-09-04: 50 ug via INTRAVENOUS
  Filled 2021-09-04: qty 2

## 2021-09-04 MED ORDER — ORAL CARE MOUTH RINSE: 15.0000 mL | Freq: Once | OROMUCOSAL | Status: DC

## 2021-09-04 MED ORDER — MENTHOL 3 MG MT LOZG: 1.0000 | LOZENGE | OROMUCOSAL | Status: DC | PRN

## 2021-09-04 MED ORDER — DEXAMETHASONE SODIUM PHOSPHATE 10 MG/ML IJ SOLN
INTRAMUSCULAR | Status: AC
  Filled 2021-09-04: qty 1

## 2021-09-04 MED ORDER — CEFAZOLIN SODIUM-DEXTROSE 2-4 GM/100ML-% IV SOLN
2.0000 g | INTRAVENOUS | Status: AC
  Administered 2021-09-04: 2 g via INTRAVENOUS

## 2021-09-04 MED ORDER — CEFAZOLIN SODIUM-DEXTROSE 2-4 GM/100ML-% IV SOLN
INTRAVENOUS | Status: AC
  Filled 2021-09-04: qty 100

## 2021-09-04 MED ORDER — CHLORHEXIDINE GLUCONATE 0.12 % MT SOLN
OROMUCOSAL | Status: AC
  Administered 2021-09-04: 15 mL
  Filled 2021-09-04: qty 15

## 2021-09-04 MED ORDER — DEXAMETHASONE SODIUM PHOSPHATE 10 MG/ML IJ SOLN
INTRAMUSCULAR | Status: DC | PRN
  Administered 2021-09-04: 10 mg via INTRAVENOUS

## 2021-09-04 MED ORDER — ROCURONIUM BROMIDE 10 MG/ML (PF) SYRINGE
PREFILLED_SYRINGE | INTRAVENOUS | Status: DC | PRN
  Administered 2021-09-04: 30 mg via INTRAVENOUS
  Administered 2021-09-04: 60 mg via INTRAVENOUS
  Administered 2021-09-04: 20 mg via INTRAVENOUS

## 2021-09-04 MED ORDER — ACETAMINOPHEN 325 MG PO TABS: 650.0000 mg | ORAL_TABLET | ORAL | Status: DC | PRN

## 2021-09-04 MED ORDER — FENTANYL CITRATE (PF) 100 MCG/2ML IJ SOLN
INTRAMUSCULAR | Status: AC
  Administered 2021-09-04: 100 ug via INTRAVENOUS
  Filled 2021-09-04: qty 2

## 2021-09-04 MED ORDER — ROCURONIUM BROMIDE 10 MG/ML (PF) SYRINGE
PREFILLED_SYRINGE | INTRAVENOUS | Status: AC
  Filled 2021-09-04: qty 10

## 2021-09-04 MED ORDER — 0.9 % SODIUM CHLORIDE (POUR BTL) OPTIME
TOPICAL | Status: DC | PRN
  Administered 2021-09-04: 1000 mL

## 2021-09-04 MED ORDER — ALBUMIN HUMAN 5 % IV SOLN: INTRAVENOUS | Status: DC | PRN

## 2021-09-04 SURGICAL SUPPLY — 74 items
ADH SKN CLS APL DERMABOND .7 (GAUZE/BANDAGES/DRESSINGS) ×2
BAG COUNTER SPONGE SURGICOUNT (BAG) ×2 IMPLANT
BAG SPNG CNTER NS LX DISP (BAG) ×1
BAND INSRT 18 STRL LF DISP RB (MISCELLANEOUS)
BAND RUBBER #18 3X1/16 STRL (MISCELLANEOUS) IMPLANT
BASKET BONE COLLECTION (BASKET) ×2 IMPLANT
BLADE CLIPPER SURG (BLADE) IMPLANT
BUR MATCHSTICK NEURO 3.0 LAGG (BURR) ×2 IMPLANT
BUR PRECISION FLUTE 5.0 (BURR) ×2 IMPLANT
CANISTER SUCT 3000ML PPV (MISCELLANEOUS) ×2 IMPLANT
CNTNR URN SCR LID CUP LEK RST (MISCELLANEOUS) ×1 IMPLANT
CONT SPEC 4OZ STRL OR WHT (MISCELLANEOUS) ×2
COVER BACK TABLE 60X90IN (DRAPES) ×2 IMPLANT
DECANTER SPIKE VIAL GLASS SM (MISCELLANEOUS) ×2 IMPLANT
DERMABOND ADVANCED (GAUZE/BANDAGES/DRESSINGS) ×2
DERMABOND ADVANCED .7 DNX12 (GAUZE/BANDAGES/DRESSINGS) ×2 IMPLANT
DRAIN JACKSON PRATT 10MM FLAT (MISCELLANEOUS) IMPLANT
DRAPE 3/4 80X56 (DRAPES) ×2 IMPLANT
DRAPE C-ARM 42X72 X-RAY (DRAPES) ×2 IMPLANT
DRAPE C-ARMOR (DRAPES) ×2 IMPLANT
DRAPE LAPAROTOMY 100X72X124 (DRAPES) ×2 IMPLANT
DRAPE MICROSCOPE LEICA (MISCELLANEOUS) IMPLANT
DRSG OPSITE 11X17.75 LRG (GAUZE/BANDAGES/DRESSINGS) ×2 IMPLANT
DRSG OPSITE POSTOP 4X6 (GAUZE/BANDAGES/DRESSINGS) IMPLANT
DURAPREP 26ML APPLICATOR (WOUND CARE) ×2 IMPLANT
ELECT BLADE INSULATED 6.5IN (ELECTROSURGICAL) ×2
ELECT REM PT RETURN 9FT ADLT (ELECTROSURGICAL) ×2
ELECTRODE BLDE INSULATED 6.5IN (ELECTROSURGICAL) ×1 IMPLANT
ELECTRODE REM PT RTRN 9FT ADLT (ELECTROSURGICAL) ×1 IMPLANT
EVACUATOR SILICONE 100CC (DRAIN) IMPLANT
EXTENDER TAB GUIDE SV 5.5/6.0 (INSTRUMENTS) ×32 IMPLANT
GAUZE 4X4 16PLY ~~LOC~~+RFID DBL (SPONGE) IMPLANT
GAUZE SPONGE 4X4 12PLY STRL (GAUZE/BANDAGES/DRESSINGS) IMPLANT
GLOVE EXAM NITRILE XL STR (GLOVE) IMPLANT
GLOVE SURG LTX SZ7.5 (GLOVE) ×4 IMPLANT
GLOVE SURG UNDER POLY LF SZ7.5 (GLOVE) ×2 IMPLANT
GOWN STRL REUS W/ TWL LRG LVL3 (GOWN DISPOSABLE) ×1 IMPLANT
GOWN STRL REUS W/ TWL XL LVL3 (GOWN DISPOSABLE) ×1 IMPLANT
GOWN STRL REUS W/TWL 2XL LVL3 (GOWN DISPOSABLE) IMPLANT
GOWN STRL REUS W/TWL LRG LVL3 (GOWN DISPOSABLE) ×2
GOWN STRL REUS W/TWL XL LVL3 (GOWN DISPOSABLE) ×2
GUIDEWIRE BLUNT NT 450 (WIRE) ×16 IMPLANT
HEMOSTAT POWDER KIT SURGIFOAM (HEMOSTASIS) ×2 IMPLANT
KIT BASIN OR (CUSTOM PROCEDURE TRAY) ×2 IMPLANT
KIT TURNOVER KIT B (KITS) ×2 IMPLANT
MILL MEDIUM DISP (BLADE) ×2 IMPLANT
NDL HYPO 21X1.5 SAFETY (NEEDLE) ×1 IMPLANT
NDL SPNL 18GX3.5 QUINCKE PK (NEEDLE) IMPLANT
NEEDLE ASPIRATION RAN815N 8X15 (NEEDLE) ×4 IMPLANT
NEEDLE ASPIRATION RANFAC 8X15 (NEEDLE) ×4
NEEDLE HYPO 18GX1.5 BLUNT FILL (NEEDLE) IMPLANT
NEEDLE HYPO 21X1.5 SAFETY (NEEDLE) ×2 IMPLANT
NEEDLE HYPO 22GX1.5 SAFETY (NEEDLE) ×2 IMPLANT
NEEDLE SPNL 18GX3.5 QUINCKE PK (NEEDLE) IMPLANT
NS IRRIG 1000ML POUR BTL (IV SOLUTION) ×2 IMPLANT
PACK LAMINECTOMY NEURO (CUSTOM PROCEDURE TRAY) ×2 IMPLANT
PAD ARMBOARD 7.5X6 YLW CONV (MISCELLANEOUS) ×6 IMPLANT
PUTTY DBF 6CC CORTICAL FIBERS (Putty) ×1 IMPLANT
ROD STRT PERC 5.5X140 (Rod) ×4 IMPLANT
SCREW 5.5X40MM SOLERA VOYAGER (Screw) ×12 IMPLANT
SCREW MAS VOYAGER 4.5X35 (Screw) ×4 IMPLANT
SCREW SET 5.5/6.0MM SOLERA (Screw) ×16 IMPLANT
SPONGE SURGIFOAM ABS GEL 100 (HEMOSTASIS) IMPLANT
SPONGE T-LAP 4X18 ~~LOC~~+RFID (SPONGE) IMPLANT
STAPLER VISISTAT 35W (STAPLE) ×2 IMPLANT
SUT MNCRL AB 3-0 PS2 18 (SUTURE) ×2 IMPLANT
SUT VIC AB 0 CT1 18XCR BRD8 (SUTURE) ×1 IMPLANT
SUT VIC AB 0 CT1 8-18 (SUTURE) ×2
SUT VIC AB 2-0 CP2 18 (SUTURE) ×2 IMPLANT
SYR 30ML LL (SYRINGE) ×2 IMPLANT
TOWEL GREEN STERILE (TOWEL DISPOSABLE) ×2 IMPLANT
TOWEL GREEN STERILE FF (TOWEL DISPOSABLE) ×2 IMPLANT
TRAY FOLEY MTR SLVR 16FR STAT (SET/KITS/TRAYS/PACK) ×2 IMPLANT
WATER STERILE IRR 1000ML POUR (IV SOLUTION) ×2 IMPLANT
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
## 2021-09-04 NOTE — Transfer of Care (Signed)
Immediate Anesthesia Transfer of Care Note  Patient: Carlos [name].  Procedure(s) Performed: Thoracic Four-Thoracic Eight  Percutaneous Instrumented Fusion (Spine Thoracic)  Patient Location: PACU  Anesthesia Type:General  Level of Consciousness: awake and alert   Airway & Oxygen Therapy: Patient Spontanous Breathing  Post-op Assessment: Report given to RN  Post vital signs: Reviewed  Last Vitals:  Vitals Value Taken Time  BP 135/107 09/04/21 1645  Temp 36.4 C 09/04/21 1645  Pulse 102 09/04/21 1652  Resp 21 09/04/21 1652  SpO2 100 % 09/04/21 1652  Vitals shown include unvalidated device data.  Last Pain:  Vitals:   09/04/21 1128  TempSrc: Oral  PainSc:       Patients Stated Pain Goal: 2 (15/94/70 7615)  Complications: No notable events documented.

## 2021-09-04 NOTE — Progress Notes (Addendum)
Pt. Arrived to unit AO X4, VSS, fluids running, Incision clean dry intact. Pt complaining of "40 out of 10" pain level. Pt. Given pain and anxiety medication. Family at bedside.
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
## 2021-09-04 NOTE — Progress Notes (Signed)
Attempted to see pt this am.  Pt at surgery.  Will reeval tomorrow. Please update orders.   Jinger Neighbors, [place] [phone]

## 2021-09-04 NOTE — Anesthesia Procedure Notes (Signed)
Procedure Name: Intubation Date/Time: 09/04/2021 1:34 PM Performed by: Charyl Dancer, RN Pre-anesthesia Checklist: Patient identified, Emergency Drugs available, Suction available and Patient being monitored Patient Re-evaluated:Patient Re-evaluated prior to induction Oxygen Delivery Method: Circle System Utilized Preoxygenation: Pre-oxygenation with 100% oxygen Induction Type: IV induction Ventilation: Mask ventilation without difficulty Laryngoscope Size: Glidescope and 4 Grade View: Grade I Tube type: Oral Tube size: 7.5 mm Number of attempts: 1 Airway Equipment and Method: Rigid stylet and Video-laryngoscopy Placement Confirmation: ETT inserted through vocal cords under direct vision, positive ETCO2 and breath sounds checked- equal and bilateral Secured at: 22 (Teeth) cm Tube secured with: Tape Dental Injury: Teeth and Oropharynx as per pre-operative assessment  Comments: C-collar remained in place during intubation with glidescope

## 2021-09-04 NOTE — Progress Notes (Signed)
Pt transferred off unit to OR, BG 139, CHG bath given, signed consent in folder with transport person.

## 2021-09-04 NOTE — Progress Notes (Signed)
PT Cancellation Note  Patient Details Name: Carlos [name]. MRN: [id number] DOB: 04/09/1971   Cancelled Treatment:    Reason Eval/Treat Not Completed: Patient at procedure or test/unavailable Pt to OR for spinal surgery.  Will f/u at later date. Abran Richard, PT Acute Rehab Services Pager [phone] [facility] [phone]   Karlton Lemon 09/04/2021, 12:07 PM

## 2021-09-05 LAB — BASIC METABOLIC PANEL
Anion gap: 10 (ref 5–15)
BUN: 11 mg/dL (ref 6–20)
CO2: 26 mmol/L (ref 22–32)
Calcium: 8.6 mg/dL — ABNORMAL LOW (ref 8.9–10.3)
Chloride: 97 mmol/L — ABNORMAL LOW (ref 98–111)
Creatinine, Ser: 0.69 mg/dL (ref 0.61–1.24)
GFR, Estimated: >60 mL/min (ref >60)
Glucose, Bld: 241 mg/dL — ABNORMAL HIGH (ref 70–99)
Potassium: 4.5 mmol/L (ref 3.5–5.1)
Sodium: 133 mmol/L — ABNORMAL LOW (ref 135–145)

## 2021-09-05 LAB — CBC
HCT: 27.2 % — ABNORMAL LOW (ref 39.0–52.0)
Hemoglobin: 9.8 g/dL — ABNORMAL LOW (ref 13.0–17.0)
MCH: 32.7 pg (ref 26.0–34.0)
MCHC: 36 g/dL (ref 30.0–36.0)
MCV: 90.7 fL (ref 80.0–100.0)
Platelets: 262 10*3/uL (ref 150–400)
RBC: 3 MIL/uL — ABNORMAL LOW (ref 4.22–5.81)
RDW: 12.4 % (ref 11.5–15.5)
WBC: 8.4 10*3/uL (ref 4.0–10.5)
nRBC: 0 % (ref 0.0–0.2)

## 2021-09-05 LAB — GLUCOSE, CAPILLARY
Glucose-Capillary: 224 mg/dL — ABNORMAL HIGH (ref 70–99)
Glucose-Capillary: 152 mg/dL — ABNORMAL HIGH (ref 70–99)
Glucose-Capillary: 144 mg/dL — ABNORMAL HIGH (ref 70–99)
Glucose-Capillary: 132 mg/dL — ABNORMAL HIGH (ref 70–99)
Glucose-Capillary: 127 mg/dL — ABNORMAL HIGH (ref 70–99)
Glucose-Capillary: 109 mg/dL — ABNORMAL HIGH (ref 70–99)

## 2021-09-05 MED ORDER — HYDROMORPHONE HCL 1 MG/ML IJ SOLN
0.5000 mg | INTRAMUSCULAR | Status: DC | PRN
Start: 2021-09-05 — End: 2021-09-05

## 2021-09-05 MED ORDER — HYDROMORPHONE HCL 1 MG/ML IJ SOLN
0.5000 mg | INTRAMUSCULAR | Status: DC | PRN
  Administered 2021-09-05 (×3): 0.5 mg via INTRAVENOUS
  Filled 2021-09-05 (×3): qty 0.5

## 2021-09-05 MED ORDER — OXYCODONE HCL 5 MG PO TABS
10.0000 mg | ORAL_TABLET | ORAL | Status: DC | PRN
  Administered 2021-09-05 – 2021-09-06 (×2): 10 mg via ORAL
  Filled 2021-09-05 (×3): qty 3
  Filled 2021-09-05: qty 2

## 2021-09-05 MED ORDER — METHOCARBAMOL 1000 MG/10ML IJ SOLN
1000.0000 mg | Freq: Three times a day (TID) | INTRAVENOUS | Status: DC
  Administered 2021-09-06: 1000 mg via INTRAVENOUS
  Filled 2021-09-05 (×5): qty 10

## 2021-09-05 MED ORDER — ACETAMINOPHEN 500 MG PO TABS
1000.0000 mg | ORAL_TABLET | Freq: Four times a day (QID) | ORAL | Status: DC
  Administered 2021-09-05 – 2021-09-13 (×31): 1000 mg via ORAL
  Filled 2021-09-05 (×33): qty 2

## 2021-09-05 MED ORDER — BISACODYL 10 MG RE SUPP
10.0000 mg | Freq: Once | RECTAL | Status: AC
  Administered 2021-09-05: 10 mg via RECTAL
  Filled 2021-09-05: qty 1

## 2021-09-05 MED ORDER — CHLORHEXIDINE GLUCONATE CLOTH 2 % EX PADS
6.0000 | MEDICATED_PAD | Freq: Every day | CUTANEOUS | Status: DC
  Administered 2021-09-05 – 2021-09-13 (×9): 6 via TOPICAL

## 2021-09-05 MED ORDER — DOCUSATE SODIUM 100 MG PO CAPS
100.0000 mg | ORAL_CAPSULE | Freq: Two times a day (BID) | ORAL | Status: DC
  Administered 2021-09-05 – 2021-09-13 (×16): 100 mg via ORAL
  Filled 2021-09-05 (×15): qty 1

## 2021-09-05 MED ORDER — SORBITOL 70 % SOLN
960.0000 mL | TOPICAL_OIL | Freq: Once | ORAL | Status: AC
  Administered 2021-09-05: 960 mL via RECTAL
  Filled 2021-09-05: qty 473

## 2021-09-05 MED ORDER — HYDROMORPHONE HCL 1 MG/ML IJ SOLN
0.2500 mg | Freq: Four times a day (QID) | INTRAMUSCULAR | Status: DC | PRN
  Administered 2021-09-06: 0.25 mg via INTRAVENOUS
  Filled 2021-09-05: qty 0.5

## 2021-09-05 MED ORDER — KETOROLAC TROMETHAMINE 15 MG/ML IJ SOLN
30.0000 mg | Freq: Four times a day (QID) | INTRAMUSCULAR | Status: AC
  Administered 2021-09-05 – 2021-09-10 (×19): 30 mg via INTRAVENOUS
  Filled 2021-09-05 (×19): qty 2

## 2021-09-05 MED ORDER — ACETAMINOPHEN 500 MG PO TABS: 1000.0000 mg | ORAL_TABLET | Freq: Three times a day (TID) | ORAL | Status: DC

## 2021-09-05 MED ORDER — OXYCODONE HCL 5 MG PO TABS: 5.0000 mg | ORAL_TABLET | ORAL | Status: DC | PRN

## 2021-09-05 MED ORDER — GABAPENTIN 100 MG PO CAPS
200.0000 mg | ORAL_CAPSULE | Freq: Three times a day (TID) | ORAL | Status: DC
  Administered 2021-09-05 – 2021-09-13 (×24): 200 mg via ORAL
  Filled 2021-09-05 (×24): qty 2

## 2021-09-05 MED ORDER — KETOROLAC TROMETHAMINE 15 MG/ML IJ SOLN: 15.0000 mg | Freq: Four times a day (QID) | INTRAMUSCULAR | Status: DC
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
## 2021-09-05 NOTE — Anesthesia Postprocedure Evaluation (Signed)
Anesthesia Post Note  Patient: Carlos [name].  Procedure(s) Performed: Thoracic Four-Thoracic Eight  Percutaneous Instrumented Fusion (Spine Thoracic)     Patient location during evaluation: PACU Anesthesia Type: General Level of consciousness: awake Pain management: pain level controlled Vital Signs Assessment: post-procedure vital signs reviewed and stable Respiratory status: spontaneous breathing Cardiovascular status: stable Postop Assessment: no apparent nausea or vomiting Anesthetic complications: no   No notable events documented.  Last Vitals:  Vitals:   09/05/21 1108 09/05/21 1500  BP: 108/71 116/75  Pulse: 92 89  Resp: 16 19  Temp: 36.7 C 36.5 C  SpO2: 95% 99%    Last Pain:  Vitals:   09/05/21 1500  TempSrc: Oral  PainSc:                  Timmey Lamba

## 2021-09-05 NOTE — Progress Notes (Signed)
Inpatient Rehab Admissions Coordinator Note:   Per therapy recommendations patient was screened for CIR candidacy by Michel Santee, PT. At this time, pt appears to be a potential candidate for CIR. I will place an order for rehab consult for full assessment, per our protocol.  Please contact me any with questions.Shann Medal, PT, DPT [phone] 09/05/21 1:24 PM

## 2021-09-05 NOTE — Progress Notes (Signed)
Subjective: Patient reports back pain  Objective: Vital signs in last 24 hours: Temp:  [96.5 F (35.8 C)-98.5 F (36.9 C)] 98.4 F (36.9 C) (09/21 0743) Pulse Rate:  [81-109] 97 (09/21 0743) Resp:  [13-21] 13 (09/21 0743) BP: (110-135)/(74-107) 124/85 (09/21 0743) SpO2:  [95 %-100 %] 95 % (09/21 0743) Weight:  [65.3 kg] 65.3 kg (09/20 1128)  Intake/Output from previous day: 09/20 0701 - 09/21 0700 In: 2355.4 [P.O.:720; I.V.:1285.4; IV Piggyback:350] Out: 5100 [Urine:4850; Blood:250] Intake/Output this shift: Total I/O In: 20 [P.O.:20] Out: -   2/5 HG, 4/5 prox RUE, 4/5 Les, stable Dressing c/d  Lab Results: Recent Labs    09/04/21 0034 09/05/21 0420  WBC 6.1 8.4  HGB 10.6* 9.8*  HCT 28.7* 27.2*  PLT 242 262   BMET Recent Labs    09/04/21 0034 09/05/21 0420  NA 132* 133*  K 3.5 4.5  CL 95* 97*  CO2 27 26  GLUCOSE 183* 241*  BUN 9 11  CREATININE 0.51* 0.69  CALCIUM 8.5* 8.6*    Studies/Results: DG Thoracic Spine 2 View  Result Date: 09/04/2021 CLINICAL DATA:  T6 compression fracture EXAM: THORACIC SPINE 2 VIEWS COMPARISON:  MRI from 08/30/2021 FLUOROSCOPY TIME:  Radiation Exposure Index (as provided by the fluoroscopic device): 35.6 mGy If the device does not provide the exposure index: Fluoroscopy Time:  2 minutes 51 seconds Number of Acquired Images:  4 FINDINGS: Images demonstrate pedicle screw placement at T4, T5, T7 and T8 with posterior fixation. T6 compression fracture is again noted. IMPRESSION: Posterior fixation in the thoracic spine for underlying T6 compression fracture. Electronically Signed   By: Inez Catalina M.D.   On: 09/04/2021 19:59   DG CHEST PORT 1 VIEW  Result Date: 09/03/2021 CLINICAL DATA:  Pneumothorax on left. Status post removal of left-sided chest tube. EXAM: PORTABLE CHEST 1 VIEW COMPARISON:  Earlier today. FINDINGS: Left pigtail catheter has been removed. The previous small left pneumothorax is not currently seen. Hazy opacity in  the left hemithorax likely represents combination of atelectasis and layering pleural fluid. Multiple displaced left rib fractures. Stable heart size. The right lung is clear. IMPRESSION: 1. Removal of left pigtail catheter. The previous small left pneumothorax is not currently seen. 2. Hazy opacity in the left hemithorax likely represents combination of atelectasis and layering pleural fluid, mildly increased from earlier today. Electronically Signed   By: Keith Rake M.D.   On: 09/03/2021 18:32   DG C-Arm 1-60 Min-No Report  Result Date: 09/04/2021 Fluoroscopy was utilized by the requesting physician.  No radiographic interpretation.   DG C-Arm 1-60 Min-No Report  Result Date: 09/04/2021 Fluoroscopy was utilized by the requesting physician.  No radiographic interpretation.    Assessment/Plan: Cervical spinal cord injury S/p C5-6 ACDF and T6 fracture s/p T4-T8 PSIF - mobilize OOB with PT/OT; no need for thoracic brace - continue C-collar   LOS: 7 days     Vallarie Mare 09/05/2021, 9:29 AM
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
## 2021-09-05 NOTE — Anesthesia Postprocedure Evaluation (Signed)
Anesthesia Post Note  Patient: Carlos [name].  Procedure(s) Performed: Thoracic Four-Thoracic Eight  Percutaneous Instrumented Fusion (Spine Thoracic)     Patient location during evaluation: PACU Anesthesia Type: General Level of consciousness: awake Pain management: pain level controlled Vital Signs Assessment: post-procedure vital signs reviewed and stable Respiratory status: spontaneous breathing Cardiovascular status: stable Postop Assessment: no apparent nausea or vomiting Anesthetic complications: no   No notable events documented.  Last Vitals:  Vitals:   09/05/21 1108 09/05/21 1500  BP: 108/71 116/75  Pulse: 92 89  Resp: 16 19  Temp: 36.7 C 36.5 C  SpO2: 95% 99%    Last Pain:  Vitals:   09/05/21 1500  TempSrc: Oral  PainSc:                  Jenya Putz

## 2021-09-06 LAB — CBC
HCT: 26.7 % — ABNORMAL LOW (ref 39.0–52.0)
Hemoglobin: 9.5 g/dL — ABNORMAL LOW (ref 13.0–17.0)
MCH: 32.6 pg (ref 26.0–34.0)
MCHC: 35.6 g/dL (ref 30.0–36.0)
MCV: 91.8 fL (ref 80.0–100.0)
Platelets: 277 10*3/uL (ref 150–400)
RBC: 2.91 MIL/uL — ABNORMAL LOW (ref 4.22–5.81)
RDW: 12.8 % (ref 11.5–15.5)
WBC: 9.4 10*3/uL (ref 4.0–10.5)
nRBC: 0 % (ref 0.0–0.2)

## 2021-09-06 LAB — GLUCOSE, CAPILLARY
Glucose-Capillary: 173 mg/dL — ABNORMAL HIGH (ref 70–99)
Glucose-Capillary: 138 mg/dL — ABNORMAL HIGH (ref 70–99)
Glucose-Capillary: 133 mg/dL — ABNORMAL HIGH (ref 70–99)
Glucose-Capillary: 103 mg/dL — ABNORMAL HIGH (ref 70–99)
Glucose-Capillary: 224 mg/dL — ABNORMAL HIGH (ref 70–99)

## 2021-09-06 LAB — BASIC METABOLIC PANEL
Anion gap: 14 (ref 5–15)
BUN: 15 mg/dL (ref 6–20)
CO2: 22 mmol/L (ref 22–32)
Calcium: 8.9 mg/dL (ref 8.9–10.3)
Chloride: 97 mmol/L — ABNORMAL LOW (ref 98–111)
Creatinine, Ser: 0.86 mg/dL (ref 0.61–1.24)
GFR, Estimated: >60 mL/min (ref >60)
Glucose, Bld: 146 mg/dL — ABNORMAL HIGH (ref 70–99)
Potassium: 3.8 mmol/L (ref 3.5–5.1)
Sodium: 133 mmol/L — ABNORMAL LOW (ref 135–145)

## 2021-09-06 MED ORDER — TAMSULOSIN HCL 0.4 MG PO CAPS
0.4000 mg | ORAL_CAPSULE | Freq: Every day | ORAL | Status: DC
  Administered 2021-09-06 – 2021-09-13 (×8): 0.4 mg via ORAL
  Filled 2021-09-06 (×8): qty 1

## 2021-09-06 MED ORDER — SENNA 8.6 MG PO TABS
1.0000 | ORAL_TABLET | Freq: Every day | ORAL | Status: DC
  Administered 2021-09-06 – 2021-09-12 (×7): 8.6 mg via ORAL
  Filled 2021-09-06 (×7): qty 1

## 2021-09-06 MED ORDER — OXYCODONE HCL 5 MG PO TABS
10.0000 mg | ORAL_TABLET | ORAL | Status: DC | PRN
  Administered 2021-09-06 – 2021-09-08 (×9): 10 mg via ORAL
  Filled 2021-09-06 (×8): qty 2

## 2021-09-06 MED ORDER — POLYETHYLENE GLYCOL 3350 17 G PO PACK
17.0000 g | PACK | Freq: Two times a day (BID) | ORAL | Status: DC | PRN
  Administered 2021-09-08 – 2021-09-10 (×2): 17 g via ORAL
  Filled 2021-09-06 (×3): qty 1

## 2021-09-06 MED ORDER — HYDROMORPHONE HCL 1 MG/ML IJ SOLN
0.2500 mg | Freq: Three times a day (TID) | INTRAMUSCULAR | Status: AC | PRN
  Administered 2021-09-06 – 2021-09-07 (×4): 0.25 mg via INTRAVENOUS
  Filled 2021-09-06 (×4): qty 0.5

## 2021-09-06 MED ORDER — METHOCARBAMOL 500 MG PO TABS
1000.0000 mg | ORAL_TABLET | Freq: Three times a day (TID) | ORAL | Status: DC
  Administered 2021-09-06 – 2021-09-13 (×22): 1000 mg via ORAL
  Filled 2021-09-06 (×22): qty 2

## 2021-09-06 NOTE — TOC CAGE-AID Note (Signed)
Transition of Care (TOC) - CAGE-AID Screening   Patient Details  Name: Carlos [name]. MRN: [id number] Date of Birth: Jul 27, 1971  Transition of Care [facility]) CM/SW Contact:    Jernee Murtaugh C Tarpley-Carter, LCSWA Phone Number: 09/06/2021, 3:02 PM   Clinical Narrative: Pt participated in [place].  Pt stated he does not use substance, but drinks  ETOH.  Pt was offered resources, due to usage of ETOH.   CSW will provide pt with resources in dc summary.  Falisa Lamora Tarpley-Carter, MSW, LCSW-A Pronouns:  She/Her/Hers [facility] Health[facility]itions of Care Clinical Social Worker Direct Number:  [phone] [email or web address]  CAGE-AID Screening:    Have You Ever Felt You Ought to Cut Down on Your Drinking or Drug Use?: No Have People Annoyed You By [employer] Your Drinking Or Drug Use?: No Have You Felt Bad Or Guilty About Your Drinking Or Drug Use?: No Have You Ever Had a Drink or Used Drugs First Thing In The Morning to Steady Your Nerves or to Get Rid of a Hangover?: No CAGE-AID Score: 0  Substance Abuse Education Offered: Yes (Pt stated he does drink ETOH.)  Substance abuse interventions: Scientist, clinical (histocompatibility and immunogenetics)

## 2021-09-06 NOTE — Progress Notes (Signed)
At start of shift pt reports feeling strong urge to have BM but unable. MD paged and suppository given per order. Pt still unable to go, feeling anxious, nauseas and abdominal cramps. Zofran and xanax given and MD paged; order for [facility] received. While awaiting smog from pharmacy pt had small amt hard stool. Pt requesting disimpaction. Hard stool manually disimpacted from rectum by RN then [facility] given. Only partial smog instilled then pt felt urge to have BM and had extra large BM which filled bedpan. Nausea and abdominal cramps relieved after BM but pt diaphoretic and reports pain 9/10 in back after rolling for bedpan and bath. Pt repositioned and pain meds given. Pt cold after bath, unable to get oral or axillary temp. Warm blankets given. Able to get temp and pt no longer diaphoretic after pain meds and warm blankets, pain also improved and able to go to sleep.

## 2021-09-06 NOTE — Progress Notes (Addendum)
Patient ID: Carlos [name]., male   DOB: [birth year], 50 y.o.   MRN: [id number]  [facility] Progress Note  2 Days Post-Op  Subjective: CC-  Feels oxycodone is helping his back pain. Also endorses L scapula pain. Pain did "get away from him" yesterday and had a lot of pain with movement required to get enema/have BM. Reports paresthesia BLE. Tolerating PO. Failed TOV and foley replaced yesterday.   Objective: Vital signs in last 24 hours: Temp:  [96.7 F (35.9 C)-98.1 F (36.7 C)] 97.6 F (36.4 C) (09/22 0740) Pulse Rate:  [81-99] 99 (09/22 0740) Resp:  [14-20] 18 (09/22 0740) BP: (100-126)/(63-84) 100/63 (09/22 0740) SpO2:  [95 %-100 %] 100 % (09/22 0740) Last BM Date: 09/06/21  Intake/Output from previous day: 09/21 0701 - 09/22 0700 In: 2080 [P.O.:380] Out: 1500 [Urine:1500] Intake/Output this shift: No intake/output data recorded.  PE: Gen:  Alert, NAD HEENT: EOM's intact, pupils equal and round. C-collar in place. Anterior neck dressing cdi Card:  RRR, no M/G/R heard, 2+ DP pulses Pulm:  CTAB, no W/R/R, rate and effort normal Abd: Soft, NT/ND Back: ecchymosis of upper back, above L scapula. No crepitus, no hematoma.  Ext:  calves soft and nontender  Psych: A&Ox4  Neuro: weak BUE Skin: no rashes noted, warm and dry  Lab Results:  Recent Labs    09/05/21 0420 09/06/21 0817  WBC 8.4 9.4  HGB 9.8* 9.5*  HCT 27.2* 26.7*  PLT 262 277   BMET Recent Labs    09/05/21 0420 09/06/21 0817  NA 133* 133*  K 4.5 3.8  CL 97* 97*  CO2 26 22  GLUCOSE 241* 146*  BUN 11 15  CREATININE 0.69 0.86  CALCIUM 8.6* 8.9   PT/INR No results for input(s): LABPROT, INR in the last 72 hours. CMP     Component Value Date/Time   NA 133 (L) 09/06/2021 0817   K 3.8 09/06/2021 0817   CL 97 (L) 09/06/2021 0817   CO2 22 09/06/2021 0817   GLUCOSE 146 (H) 09/06/2021 0817   BUN 15 09/06/2021 0817   CREATININE 0.86 09/06/2021 0817   CALCIUM 8.9 09/06/2021 0817    PROT 6.8 08/29/2021 1457   ALBUMIN 3.4 (L) 08/29/2021 1457   AST 37 08/29/2021 1457   ALT 19 08/29/2021 1457   ALKPHOS 73 08/29/2021 1457   BILITOT 0.5 08/29/2021 1457   GFRNONAA >60 09/06/2021 0817   Lipase  No results found for: LIPASE     Studies/Results: DG Thoracic Spine 2 View  Result Date: 09/04/2021 CLINICAL DATA:  T6 compression fracture EXAM: THORACIC SPINE 2 VIEWS COMPARISON:  MRI from 08/30/2021 FLUOROSCOPY TIME:  Radiation Exposure Index (as provided by the fluoroscopic device): 35.6 mGy If the device does not provide the exposure index: Fluoroscopy Time:  2 minutes 51 seconds Number of Acquired Images:  4 FINDINGS: Images demonstrate pedicle screw placement at T4, T5, T7 and T8 with posterior fixation. T6 compression fracture is again noted. IMPRESSION: Posterior fixation in the thoracic spine for underlying T6 compression fracture. Electronically Signed   By: Inez Catalina M.D.   On: 09/04/2021 19:59   DG C-Arm 1-60 Min-No Report  Result Date: 09/04/2021 Fluoroscopy was utilized by the requesting physician.  No radiographic interpretation.   DG C-Arm 1-60 Min-No Report  Result Date: 09/04/2021 Fluoroscopy was utilized by the requesting physician.  No radiographic interpretation.    Anti-infectives: Anti-infectives (From admission, onward)    Start     Dose/Rate Route  Frequency Ordered Stop   09/04/21 2200  ceFAZolin (ANCEF) IVPB 1 g/50 mL premix        1 g 100 mL/hr over 30 Minutes Intravenous Every 8 hours 09/04/21 1733 09/05/21 1354   09/04/21 1153  ceFAZolin (ANCEF) IVPB 2g/100 mL premix        2 g 200 mL/hr over 30 Minutes Intravenous 30 min pre-op 09/04/21 1153 09/04/21 1337   09/04/21 1105  ceFAZolin (ANCEF) 2-4 GM/100ML-% IVPB       Note to Pharmacy: Roosvelt Maser   : cabinet override      09/04/21 1105 09/04/21 1353   08/31/21 0300  ceFAZolin (ANCEF) IVPB 2g/100 mL premix        2 g 200 mL/hr over 30 Minutes Intravenous Every 8 hours 08/30/21 2238  08/31/21 1110        Assessment/Plan 71M [facility]   L rib fxs with PTX - CT placed in ED. CT out 9/19 and no PTX on pm cxr. L clavicle fx - non-op per Dr. Sammuel Hines, sling L scapula fx - non-op per Dr. Sammuel Hines, sling C6 spinous process and pedical fractures - s/p ACDF 9/15 by Dr. Fidela Juneau cord syndrome - expecting CIR needs, PT/OT maintaining spine precautions. Some improvement in exam T6 compression fx - s/p posterior arthrodesis 9/20 Dr. Marcello Moores. Will confirm with NSGY if patient needs to continue spine precautions and HOB elevation restrictions DM - poorly controlled at home, Hba1c 8.4, home meds, SSI FEN: carb mod diet, D/C IVF Pain: APAP 1,000 mg q 6h, gabapentin 200 mg TID for nerve pain last increased 9/21), 50 mg tramadol q 6h, oxycodone 10 mg q 4h PRN, toradol Q 6h (started 9/21), change robaxin to PO 1,000 mg TID, decrease IV HM to q8h PRN for breakthrough Foley -  failed voiding trial 9/21, continue urecholine, add flomax, repeat TOC 9/23 or 9/24 ID: [email or web address], periop ABX VTE: PAS, LMWH  Dispo: Therapies, pain control as above,  medically stable for CIR   LOS: 8 days    Jill Alexanders, [facility] 09/06/2021, 10:22 AM Please see Amion for pager number during day hours 7:00am-4:30pm

## 2021-09-06 NOTE — Progress Notes (Signed)
Inpatient Rehab Admissions Coordinator:   Met with pt and family at bedside to discuss CIR recommendations, goals, and estimated length of stay.  I do think that, given SCI, he would likely have an estimated stay of 3 weeks or so, and goals would likely be supervision for mobility and up to mod assist for ADLs.  Pt likely would not have 24/7 assist at home.  We talked about the difference between SNF and CIR as far as therapy and potential LOS.  I anticipate that CIR would be the better option for him, even without 24/7 assist at home.  I will run it by Dr. Naaman Plummer in the AM to confirm.  Pt and family are going to work over the weekend to pull together as much help as they can and I will f/u with them on Monday. I likely will not have a bed for this patient on CIR before next week.    Shann Medal, PT, DPT Admissions Coordinator [phone] 09/06/21  1:46 PM

## 2021-09-06 NOTE — Progress Notes (Signed)
Subjective: Patient reports mid back pain, hand strength is improving  Objective: Vital signs in last 24 hours: Temp:  [96.7 F (35.9 C)-98.1 F (36.7 C)] 97.6 F (36.4 C) (09/22 0740) Pulse Rate:  [81-99] 99 (09/22 0740) Resp:  [14-20] 18 (09/22 0740) BP: (100-126)/(63-84) 100/63 (09/22 0740) SpO2:  [95 %-100 %] 100 % (09/22 0740)  Intake/Output from previous day: 09/21 0701 - 09/22 0700 In: 2080 [P.O.:380] Out: 1500 [Urine:1500] Intake/Output this shift: No intake/output data recorded.  3/5 hand grip, 4/5 prox RUE, 4+/5 LLE, 4/5 RLE Anterior neck incision redressed with steristrips Back dressing c/d  Lab Results: Recent Labs    09/05/21 0420 09/06/21 0817  WBC 8.4 9.4  HGB 9.8* 9.5*  HCT 27.2* 26.7*  PLT 262 277   BMET Recent Labs    09/04/21 0034 09/05/21 0420  NA 132* 133*  K 3.5 4.5  CL 95* 97*  CO2 27 26  GLUCOSE 183* 241*  BUN 9 11  CREATININE 0.51* 0.69  CALCIUM 8.5* 8.6*    Studies/Results: DG Thoracic Spine 2 View  Result Date: 09/04/2021 CLINICAL DATA:  T6 compression fracture EXAM: THORACIC SPINE 2 VIEWS COMPARISON:  MRI from 08/30/2021 FLUOROSCOPY TIME:  Radiation Exposure Index (as provided by the fluoroscopic device): 35.6 mGy If the device does not provide the exposure index: Fluoroscopy Time:  2 minutes 51 seconds Number of Acquired Images:  4 FINDINGS: Images demonstrate pedicle screw placement at T4, T5, T7 and T8 with posterior fixation. T6 compression fracture is again noted. IMPRESSION: Posterior fixation in the thoracic spine for underlying T6 compression fracture. Electronically Signed   By: Inez Catalina M.D.   On: 09/04/2021 19:59   DG C-Arm 1-60 Min-No Report  Result Date: 09/04/2021 Fluoroscopy was utilized by the requesting physician.  No radiographic interpretation.   DG C-Arm 1-60 Min-No Report  Result Date: 09/04/2021 Fluoroscopy was utilized by the requesting physician.  No radiographic interpretation.     Assessment/Plan: 50 yo M s/p C5-6 ACDF and T4-8 PSIF after [facility] - mobilize with PT - C-collar when OOB - DVT prophylaxis - appears to be a good rehab candidate Vallarie Mare 09/06/2021, 10:06 AM
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
## 2021-09-07 ENCOUNTER — Encounter (HOSPITAL_COMMUNITY): Payer: Self-pay | Admitting: Neurosurgery

## 2021-09-07 LAB — GLUCOSE, CAPILLARY
Glucose-Capillary: 151 mg/dL — ABNORMAL HIGH (ref 70–99)
Glucose-Capillary: 197 mg/dL — ABNORMAL HIGH (ref 70–99)
Glucose-Capillary: 147 mg/dL — ABNORMAL HIGH (ref 70–99)
Glucose-Capillary: 193 mg/dL — ABNORMAL HIGH (ref 70–99)

## 2021-09-07 MED ORDER — ENSURE ENLIVE PO LIQD
237.0000 mL | Freq: Three times a day (TID) | ORAL | Status: DC
  Administered 2021-09-07 – 2021-09-13 (×14): 237 mL via ORAL
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
## 2021-09-07 NOTE — Progress Notes (Signed)
Patient ID: Carlos [name]., male   DOB: April 03, 1971, 50 y.o.   MRN: [id number]  [facility] Surgery Progress Note  3 Days Post-Op  Subjective: CC-  No new complaints. Better pain control with adjustments from yesterday. Reports feeling less anxious. Tolerating PO. Having flatus, last BM 9/21. Wife at bedside.   Objective: Vital signs in last 24 hours: Temp:  [97.5 F (36.4 C)-98 F (36.7 C)] 97.7 F (36.5 C) (09/23 0740) Pulse Rate:  [85-100] 85 (09/23 0740) Resp:  [10-18] 13 (09/23 0740) BP: (100-117)/(61-73) 109/70 (09/23 0740) SpO2:  [97 %-100 %] 97 % (09/23 0740) Last BM Date: 09/05/21  Intake/Output from previous day: 09/22 0701 - 09/23 0700 In: 1050 [P.O.:450; I.V.:600] Out: 1750 [Urine:1750] Intake/Output this shift: No intake/output data recorded.  PE: Gen:  Alert, NAD HEENT: EOM's intact, pupils equal and round. C-collar no currently on. Anterior neck dressing cdi Card:  RRR, no M/G/R heard, 2+ DP pulses Pulm:  CTAB, no W/R/R, rate and effort normal Abd: Soft, NT/ND Back: ecchymosis of upper back, above L scapula. No crepitus, no hematoma.  Ext:  calves soft and nontender  Psych: A&Ox4  Neuro: weak BUE Skin: no rashes noted, warm and dry  Lab Results:  Recent Labs    09/05/21 0420 09/06/21 0817  WBC 8.4 9.4  HGB 9.8* 9.5*  HCT 27.2* 26.7*  PLT 262 277   BMET Recent Labs    09/05/21 0420 09/06/21 0817  NA 133* 133*  K 4.5 3.8  CL 97* 97*  CO2 26 22  GLUCOSE 241* 146*  BUN 11 15  CREATININE 0.69 0.86  CALCIUM 8.6* 8.9   PT/INR No results for input(s): LABPROT, INR in the last 72 hours. CMP     Component Value Date/Time   NA 133 (L) 09/06/2021 0817   K 3.8 09/06/2021 0817   CL 97 (L) 09/06/2021 0817   CO2 22 09/06/2021 0817   GLUCOSE 146 (H) 09/06/2021 0817   BUN 15 09/06/2021 0817   CREATININE 0.86 09/06/2021 0817   CALCIUM 8.9 09/06/2021 0817   PROT 6.8 08/29/2021 1457   ALBUMIN 3.4 (L) 08/29/2021 1457   AST 37 08/29/2021  1457   ALT 19 08/29/2021 1457   ALKPHOS 73 08/29/2021 1457   BILITOT 0.5 08/29/2021 1457   GFRNONAA >60 09/06/2021 0817   Lipase  No results found for: LIPASE     Studies/Results: No results found.  Anti-infectives: Anti-infectives (From admission, onward)    Start     Dose/Rate Route Frequency Ordered Stop   09/04/21 2200  ceFAZolin (ANCEF) IVPB 1 g/50 mL premix        1 g 100 mL/hr over 30 Minutes Intravenous Every 8 hours 09/04/21 1733 09/05/21 1354   09/04/21 1153  ceFAZolin (ANCEF) IVPB 2g/100 mL premix        2 g 200 mL/hr over 30 Minutes Intravenous 30 min pre-op 09/04/21 1153 09/04/21 1337   09/04/21 1105  ceFAZolin (ANCEF) 2-4 GM/100ML-% IVPB       Note to Pharmacy: Roosvelt Maser   : cabinet override      09/04/21 1105 09/04/21 1353   08/31/21 0300  ceFAZolin (ANCEF) IVPB 2g/100 mL premix        2 g 200 mL/hr over 30 Minutes Intravenous Every 8 hours 08/30/21 2238 08/31/21 1110        Assessment/Plan 76M [facility]   L rib fxs with PTX - CT placed in ED. CT out 9/19 and no PTX on pm  cxr. L clavicle fx - non-op per Dr. Sammuel Hines, sling L scapula fx - non-op per Dr. Sammuel Hines, sling C6 spinous process and pedical fractures - s/p ACDF 9/15 by Dr. Fidela Juneau cord syndrome - expecting CIR needs, PT/OT maintaining spine precautions. Some improvement in exam T6 compression fx - s/p posterior arthrodesis 9/20 Dr. Marcello Moores. Will confirm with NSGY if patient needs to continue spine precautions and HOB elevation restrictions DM - poorly controlled at home, Hba1c 8.4, home meds, SSI FEN: carb mod diet, D/C IVF Pain: APAP 1,000 mg q 6h, gabapentin 200 mg TID for nerve pain last increased 9/21), 50 mg tramadol q 6h, oxycodone 10 mg q 4h PRN, toradol Q 6h (started 9/21), change robaxin to PO 1,000 mg TID, decrease IV HM to q8h PRN for breakthrough Foley -  failed voiding trial 9/21, continue urecholine, add flomax, repeat TOV tomorrow AM 9/24  ID: [email or web address], periop ABX VTE: PAS, LMWH   Dispo: Therapies, pain control as above, D/C IV dilaudid in AM, medically stable for CIR   LOS: 9 days    Jill Alexanders, [facility] 09/07/2021, 10:36 AM Please see Amion for pager number during day hours 7:00am-4:30pm

## 2021-09-07 NOTE — Progress Notes (Signed)
The patient is requesting medication for muscle crumps and stiffness. Notified Dr. Kieth Brightly and he stated he had taken all the medications he needed for the night. Will convey the message to the patient. And continue to monitor.
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
## 2021-09-07 NOTE — Progress Notes (Signed)
Orthopedic Tech Progress Note Patient Details:  Carlos [name]. 1971-11-04 [id number] Patient was fitted with an XL arm sling and both resting hand splints have been ordered from [place] Type of Ortho Device: Arm sling Ortho Device/Splint Interventions: Ordered      Linus Salmons Gwendalyn Mcgonagle 09/07/2021, 2:08 PM
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
## 2021-09-08 LAB — GLUCOSE, CAPILLARY
Glucose-Capillary: 164 mg/dL — ABNORMAL HIGH (ref 70–99)
Glucose-Capillary: 169 mg/dL — ABNORMAL HIGH (ref 70–99)
Glucose-Capillary: 220 mg/dL — ABNORMAL HIGH (ref 70–99)
Glucose-Capillary: 182 mg/dL — ABNORMAL HIGH (ref 70–99)

## 2021-09-08 MED ORDER — OXYCODONE HCL 5 MG PO TABS
5.0000 mg | ORAL_TABLET | ORAL | Status: DC | PRN
  Administered 2021-09-08 – 2021-09-10 (×9): 10 mg via ORAL
  Administered 2021-09-10: 5 mg via ORAL
  Administered 2021-09-10 – 2021-09-13 (×16): 10 mg via ORAL
  Administered 2021-09-13: 5 mg via ORAL
  Administered 2021-09-13 (×2): 10 mg via ORAL
  Filled 2021-09-08 (×13): qty 2
  Filled 2021-09-08: qty 1
  Filled 2021-09-08 (×16): qty 2

## 2021-09-08 NOTE — Progress Notes (Signed)
   Trauma/Critical Care Follow Up Note  Subjective:    Overnight Issues:   Objective:  Vital signs for last 24 hours: Temp:  [97.5 F (36.4 C)-98.4 F (36.9 C)] 98.4 F (36.9 C) (09/24 0717) Pulse Rate:  [79-98] 80 (09/24 0717) Resp:  [14-19] 16 (09/24 0717) BP: (104-125)/(63-78) 109/77 (09/24 0717) SpO2:  [96 %-100 %] 98 % (09/24 0717) Weight:  [60.6 kg] 60.6 kg (09/23 1549)  Hemodynamic parameters for last 24 hours:    Intake/Output from previous day: 09/23 0701 - 09/24 0700 In: 1112.6 [I.V.:1112.6] Out: 8938 [Urine:4114]  Intake/Output this shift: No intake/output data recorded.  Vent settings for last 24 hours:    Physical Exam:  Gen: comfortable, no distress Neuro: non-focal exam HEENT: PERRL Neck: supple CV: RRR Pulm: unlabored breathing Abd: soft, NT GU: clear yellow urine Extr: wwp, no edema   Results for orders placed or performed during the hospital encounter of 08/29/21 (from the past 24 hour(s))  Glucose, capillary     Status: Abnormal   Collection Time: 09/07/21 11:15 AM  Result Value Ref Range   Glucose-Capillary 197 (H) 70 - 99 mg/dL  Glucose, capillary     Status: Abnormal   Collection Time: 09/07/21  3:34 PM  Result Value Ref Range   Glucose-Capillary 147 (H) 70 - 99 mg/dL  Glucose, capillary     Status: Abnormal   Collection Time: 09/07/21  9:36 PM  Result Value Ref Range   Glucose-Capillary 193 (H) 70 - 99 mg/dL  Glucose, capillary     Status: Abnormal   Collection Time: 09/08/21  8:28 AM  Result Value Ref Range   Glucose-Capillary 164 (H) 70 - 99 mg/dL   Comment 1 Notify RN    Comment 2 Document in Chart     Assessment & Plan:  Present on Admission: **None**    LOS: 10 days   Additional comments:I reviewed the patient's new clinical lab test results.   and I reviewed the patients new imaging test results.    36M [facility]   L rib fxs with PTX - CT placed in ED. CT out 9/19 and no PTX on pm cxr. L clavicle fx - non-op per Dr.  Sammuel Hines, sling L scapula fx - non-op per Dr. Sammuel Hines, sling C6 spinous process and pedical fractures - s/p ACDF 9/15 by Dr. Fidela Juneau cord syndrome - expecting CIR needs, PT/OT maintaining spine precautions. Some improvement in exam T6 compression fx - s/p posterior arthrodesis 9/20 Dr. Marcello Moores. Will confirm with NSGY if patient needs to continue spine precautions and HOB elevation restrictions DM - poorly controlled at home, Hba1c 8.4, home meds, SSI FEN: carb mod diet Pain: APAP 1,000 mg q 6h, gabapentin 200 mg TID for nerve pain last increased 9/21), 50 mg tramadol q 6h, oxycodone 10 mg q 4h PRN, toradol Q 6h (started 9/21), change robaxin to PO 1,000 mg TID, decrease IV HM to q8h PRN for breakthrough Foley -  failed voiding trial 9/21, flomax, repeat TOV this PM 9/24  ID: [id number], periop ABX VTE: PAS, LMWH  Dispo: TTF, medically stable for CIR  Jesusita Oka, MD Trauma & General Surgery Please use [email or web address] to contact on call provider  09/08/2021  *Care during the described time interval was provided by me. I have reviewed this patient's available data, including medical history, events of note, physical examination and test results as part of my evaluation.
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
## 2021-09-08 NOTE — Plan of Care (Signed)

## 2021-09-08 NOTE — Progress Notes (Signed)
Patient ID: Carlos [name]., male   DOB: 1971/08/02, 50 y.o.   MRN: [id number] Vital signs are stable Patient is alert moving all extremities well Pain under good control. Continue to mobilize

## 2021-09-09 DIAGNOSIS — E43 Unspecified severe protein-calorie malnutrition: Secondary | ICD-10-CM | POA: Insufficient documentation

## 2021-09-09 LAB — GLUCOSE, CAPILLARY
Glucose-Capillary: 187 mg/dL — ABNORMAL HIGH (ref 70–99)
Glucose-Capillary: 233 mg/dL — ABNORMAL HIGH (ref 70–99)
Glucose-Capillary: 131 mg/dL — ABNORMAL HIGH (ref 70–99)
Glucose-Capillary: 189 mg/dL — ABNORMAL HIGH (ref 70–99)

## 2021-09-09 NOTE — Progress Notes (Signed)
Paged on call PA for transfer order clarification. Pt to transfer to 6N or 5N per PA.

## 2021-09-09 NOTE — Progress Notes (Signed)
Patient ID: Carlos [name]., male   DOB: 1971-09-07, 50 y.o.   MRN: [id number] BP 122/85 (BP Location: Right Arm)   Pulse 78   Temp 97.6 F (36.4 C) (Oral)   Resp 18   Ht 6' 4"  (1.93 m)   Wt 60.6 kg   SpO2 97%   BMI 16.26 kg/m  Alert and oriented x 4, speech is clear and fluent Moving lower extremities well, limited movement left upper extremity, weakness in hands Wounds are clean, dry, no signs of infection Continue PT, will do inpatient rehabiliation

## 2021-09-09 NOTE — Progress Notes (Signed)
   Trauma/Critical Care Follow Up Note  Subjective:    Overnight Issues:  Pt very concerned about the left arm/shoulder  Objective:  Vital signs for last 24 hours: Temp:  [97.6 F (36.4 C)-97.9 F (36.6 C)] 97.6 F (36.4 C) (09/25 0703) Pulse Rate:  [78] 78 (09/25 0703) Resp:  [13-23] 18 (09/25 0703) BP: (103-127)/(68-85) 122/85 (09/25 0703) SpO2:  [97 %-99 %] 97 % (09/25 0703)   Intake/Output from previous day: 09/24 0701 - 09/25 0700 In: -  Out: 3000 [Urine:3000]  Intake/Output this shift: No intake/output data recorded.   Physical Exam:  Gen: comfortable, no distress Neuro: good strength BLE. Right arm a bit uncoordinated and weak, but moving some. Left UE with less movement and swelling of shoulder.  HEENT: PERRL Neck: supple CV: RRR Pulm: unlabored breathing Abd: soft, NT, ND GU: clear yellow urine Extr: wwp, no edema   Results for orders placed or performed during the hospital encounter of 08/29/21 (from the past 24 hour(s))  Glucose, capillary     Status: Abnormal   Collection Time: 09/08/21  5:24 PM  Result Value Ref Range   Glucose-Capillary 220 (H) 70 - 99 mg/dL  Glucose, capillary     Status: Abnormal   Collection Time: 09/08/21  9:20 PM  Result Value Ref Range   Glucose-Capillary 182 (H) 70 - 99 mg/dL  Glucose, capillary     Status: Abnormal   Collection Time: 09/09/21  8:07 AM  Result Value Ref Range   Glucose-Capillary 187 (H) 70 - 99 mg/dL   Comment 1 Notify RN    Comment 2 Document in Chart     Assessment & Plan:    LOS: 11 days   Additional comments:I reviewed the patient's new clinical lab test results.   and I reviewed the patients new imaging test results.    55M [facility]   L rib fxs with PTX - CT placed in ED. CT out 9/19 and no PTX on pm cxr. L clavicle fx - non-op per Dr. Sammuel Hines, sling L scapula fx - non-op per Dr. Sammuel Hines, sling C6 spinous process and pedical fractures - s/p ACDF 9/15 by Dr. Fidela Juneau cord syndrome -  expecting CIR needs, PT/OT maintaining spine precautions. Some improvement in exam.  T6 compression fx - s/p posterior arthrodesis 9/20 Dr. Marcello Moores.  DM - poorly controlled at home, Hba1c 8.4, home meds, SSI FEN: carb mod diet Pain: APAP 1,000 mg q 6h, gabapentin 200 mg TID for nerve pain last increased 9/21, 50 mg tramadol q 6h, oxycodone 10 mg q 4h PRN, toradol Q 6h (started 9/21), robaxin to PO 1,000 mg TID,  IV dilaudid q8h PRN for breakthrough Foley -  failed voiding trial 9/21, flomax, appears to be doing OK today after foley removal and trial of voiding.  ID: [email or web address], periop ABX VTE: PAS, LMWH   Dispo: TTF, medically stable for CIR  Milus Height, MD FACS Surgical Oncology, General Surgery, Trauma and [facility], [place] for weekday/non holidays Check [email or web address] for coverage night/weekend/holidays  Do not use SecureChat as it is not reliable for timely patient care.     09/09/2021  *Care during the described time interval was provided by me. I have reviewed this patient's available data, including medical history, events of note, physical examination and test results as part of my evaluation.

## 2021-09-10 LAB — GLUCOSE, CAPILLARY
Glucose-Capillary: 187 mg/dL — ABNORMAL HIGH (ref 70–99)
Glucose-Capillary: 142 mg/dL — ABNORMAL HIGH (ref 70–99)
Glucose-Capillary: 126 mg/dL — ABNORMAL HIGH (ref 70–99)
Glucose-Capillary: 170 mg/dL — ABNORMAL HIGH (ref 70–99)

## 2021-09-10 MED ORDER — IBUPROFEN 800 MG PO TABS
800.0000 mg | ORAL_TABLET | Freq: Three times a day (TID) | ORAL | Status: DC
  Administered 2021-09-10 – 2021-09-13 (×9): 800 mg via ORAL
  Filled 2021-09-10: qty 4
  Filled 2021-09-10: qty 1
  Filled 2021-09-10: qty 4
  Filled 2021-09-10 (×4): qty 1
  Filled 2021-09-10 (×2): qty 4

## 2021-09-10 NOTE — Progress Notes (Signed)
OT Cancellation Note  Patient Details Name: Carlos [name]. MRN: [id number] DOB: 08-12-1971   Cancelled Treatment:    Reason Eval/Treat Not Completed: Other (comment)Pt requests pain meds before therapy. They are due at 12:50 (RN to give as close to then as possible when speaking with her). Will try back later today for tx session.  Golden Circle, OTR/L Acute Rehab Services Pager [phone] Office [phone]    Almon Register 09/10/2021, 12:40 PM
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
## 2021-09-10 NOTE — Progress Notes (Signed)
PT Cancellation Note  Patient Details Name: Carlos [name]. MRN: [id number] DOB: 1971/06/17   Cancelled Treatment:    Reason Eval/Treat Not Completed: Pain limiting ability to participate.  Pt attempting to urinate, and would like next round of pain meds prior to therapy.  PT/OT to attempt back later as time allows.    Thanks,  Verdene Lennert, PT, DPT  Acute Rehabilitation Ortho Tech Supervisor [phone] pager #[phone] office      Wells Guiles B Dmiya Malphrus 09/10/2021, 12:46 PM

## 2021-09-10 NOTE — Progress Notes (Signed)
Inpatient Rehab Admissions Coordinator:   Met with patient at bedside and therapy.  Pt up in chair and per therapy mod assist for transfer with RW.  Updated pt on goals for CIR likely intermittent mod I, meaning he can be alone some during the day while family works.  I will work on Print production planner started tomorrow morning with therapy notes from today.   Shann Medal, PT, DPT Admissions Coordinator [phone] 09/10/21  3:43 PM

## 2021-09-10 NOTE — Progress Notes (Signed)
Patient ID: Carlos Frieze., male   DOB: 1971-04-02, 49 y.o.   MRN: [id number]  [facility] Surgery Progress Note  6 Days Post-Op  Subjective: CC-  Foley removed Saturday and initially did well, now states that he has not been able to void in 7 hours. Off dilaudid.  Objective: Vital signs in last 24 hours: Temp:  [97.6 F (36.4 C)-98.7 F (37.1 C)] 97.8 F (36.6 C) (09/26 0724) Pulse Rate:  [79-89] 79 (09/26 0015) Resp:  [14-17] 14 (09/26 0724) BP: (110-124)/(74-84) 121/81 (09/26 0724) SpO2:  [96 %-98 %] 98 % (09/26 0724) Last BM Date: 09/07/21  Intake/Output from previous day: 09/25 0701 - 09/26 0700 In: 460 [P.O.:460] Out: 1650 [Urine:1650] Intake/Output this shift: No intake/output data recorded.  PE: Gen: Alert, NAD but uncomfortable Neuro: good strength BLE. Moving BUE but with weakness HEENT: PERRL CV: RRR Pulm: CTAB, rate and effort normal Abd: soft, ND, mild tenderness over bladder otherwise nontender Extr: calves soft and nontender without edema  Lab Results:  No results for input(s): WBC, HGB, HCT, PLT in the last 72 hours. BMET No results for input(s): NA, K, CL, CO2, GLUCOSE, BUN, CREATININE, CALCIUM in the last 72 hours. PT/INR No results for input(s): LABPROT, INR in the last 72 hours. CMP     Component Value Date/Time   NA 133 (L) 09/06/2021 0817   K 3.8 09/06/2021 0817   CL 97 (L) 09/06/2021 0817   CO2 22 09/06/2021 0817   GLUCOSE 146 (H) 09/06/2021 0817   BUN 15 09/06/2021 0817   CREATININE 0.86 09/06/2021 0817   CALCIUM 8.9 09/06/2021 0817   PROT 6.8 08/29/2021 1457   ALBUMIN 3.4 (L) 08/29/2021 1457   AST 37 08/29/2021 1457   ALT 19 08/29/2021 1457   ALKPHOS 73 08/29/2021 1457   BILITOT 0.5 08/29/2021 1457   GFRNONAA >60 09/06/2021 0817   Lipase  No results found for: LIPASE     Studies/Results: No results found.  Anti-infectives: Anti-infectives (From admission, onward)    Start     Dose/Rate Route Frequency Ordered  Stop   09/04/21 2200  ceFAZolin (ANCEF) IVPB 1 g/50 mL premix        1 g 100 mL/hr over 30 Minutes Intravenous Every 8 hours 09/04/21 1733 09/05/21 1354   09/04/21 1153  ceFAZolin (ANCEF) IVPB 2g/100 mL premix        2 g 200 mL/hr over 30 Minutes Intravenous 30 min pre-op 09/04/21 1153 09/04/21 1337   09/04/21 1105  ceFAZolin (ANCEF) 2-4 GM/100ML-% IVPB       Note to Pharmacy: Roosvelt Maser   : cabinet override      09/04/21 1105 09/04/21 1353   08/31/21 0300  ceFAZolin (ANCEF) IVPB 2g/100 mL premix        2 g 200 mL/hr over 30 Minutes Intravenous Every 8 hours 08/30/21 2238 08/31/21 1110        Assessment/Plan 58M [facility]   L rib fxs with PTX - CT placed in ED. CT out 9/19 and no PTX on pm cxr. L clavicle fx - non-op per Dr. Sammuel Hines, sling L scapula fx - non-op per Dr. Sammuel Hines, sling C6 spinous process and pedical fractures - s/p ACDF 9/15 by Dr. Fidela Juneau cord syndrome - expecting CIR needs, PT/OT maintaining spine precautions. Some improvement in exam.  T6 compression fx - s/p posterior arthrodesis 9/20 Dr. Marcello Moores.  DM - poorly controlled at home, Hba1c 8.4, home meds, SSI FEN: carb mod diet Pain: APAP 1,000  mg q 6h, gabapentin 200 mg TID for nerve pain last increased 9/21, 50 mg tramadol q 6h, oxycodone 10 mg q 4h PRN, robaxin to PO 1,000 mg TID, completed course of toradol 9/26 Foley -  failed voiding trial 9/21. Repeat TOV 9/24 initially passed now has no UOP in 7 hours - needs bladder scan and possible I&O. Continue flomax ID: [email or web address], periop ABX VTE: PAS, LMWH    Dispo: Awaiting transfer to the floor. Medically stable for CIR. Bladder scan as above.   LOS: 12 days    [facility] 09/10/2021, 11:05 AM Please see Amion for pager number during day hours 7:00am-4:30pm
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
## 2021-09-11 LAB — GLUCOSE, CAPILLARY
Glucose-Capillary: 222 mg/dL — ABNORMAL HIGH (ref 70–99)
Glucose-Capillary: 196 mg/dL — ABNORMAL HIGH (ref 70–99)
Glucose-Capillary: 158 mg/dL — ABNORMAL HIGH (ref 70–99)
Glucose-Capillary: 207 mg/dL — ABNORMAL HIGH (ref 70–99)

## 2021-09-11 LAB — BASIC METABOLIC PANEL
Anion gap: 10 (ref 5–15)
BUN: 14 mg/dL (ref 6–20)
CO2: 28 mmol/L (ref 22–32)
Calcium: 9.3 mg/dL (ref 8.9–10.3)
Chloride: 93 mmol/L — ABNORMAL LOW (ref 98–111)
Creatinine, Ser: 0.57 mg/dL — ABNORMAL LOW (ref 0.61–1.24)
GFR, Estimated: >60 mL/min (ref >60)
Glucose, Bld: 153 mg/dL — ABNORMAL HIGH (ref 70–99)
Potassium: 4.8 mmol/L (ref 3.5–5.1)
Sodium: 131 mmol/L — ABNORMAL LOW (ref 135–145)

## 2021-09-11 MED ORDER — BISACODYL 10 MG RE SUPP
10.0000 mg | Freq: Once | RECTAL | Status: DC
Start: 2021-09-11 — End: 2021-09-13
  Filled 2021-09-11: qty 1

## 2021-09-11 MED ORDER — SODIUM CHLORIDE 0.9 % IV BOLUS
500.0000 mL | Freq: Once | INTRAVENOUS | Status: AC
  Administered 2021-09-11: 500 mL via INTRAVENOUS

## 2021-09-11 NOTE — Progress Notes (Signed)
Pt called out saying he felt like he was going to faint, took BP and it was 74/48. MD on unit, stated to give 500 NS Bolus. Bolus started pt in chair alert and oriented.
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
## 2021-09-11 NOTE — Progress Notes (Signed)
Inpatient Rehab Admissions Coordinator:   Insurance auth pending.  Will follow.  Shann Medal, PT, DPT Admissions Coordinator [phone] 09/11/21  9:25 AM

## 2021-09-11 NOTE — Progress Notes (Signed)
Pt transferred to 6N report called to [place], Therapist, sports.

## 2021-09-11 NOTE — Progress Notes (Signed)
Patient ID: Carlos [name]., male   DOB: Jan 09, 1971, 50 y.o.   MRN: [id number]  [facility] Surgery Progress Note  7 Days Post-Op  Subjective: CC-  No new complaints. Still having some discomfort, mostly in the LUE and left chest. Worse with movement. Denies abdominal pain, n/v. Urinating without issues. 1 stool documented yesterday but patient states his last BM was 3 days ago. Awaiting CIR.  Objective: Vital signs in last 24 hours: Temp:  [97.2 F (36.2 C)-98.5 F (36.9 C)] 98.4 F (36.9 C) (09/27 0735) Pulse Rate:  [80-97] 93 (09/27 0735) Resp:  [12-20] 20 (09/27 0735) BP: (102-124)/(66-85) 121/85 (09/27 0735) SpO2:  [95 %-99 %] 99 % (09/27 0735) Last BM Date: 09/07/21  Intake/Output from previous day: 09/26 0701 - 09/27 0700 In: 600 [P.O.:600] Out: 2701 [Urine:2700; Stool:1] Intake/Output this shift: No intake/output data recorded.  PE: Gen: Alert, NAD Neuro: good strength BLE. Moving BUE but with weakness HEENT: PERRL CV: RRR, palpable pedal pulses bilaterally Pulm: CTAB, rate and effort normal Abd: soft, ND, NT Extr: calves soft and nontender without edema  Lab Results:  No results for input(s): WBC, HGB, HCT, PLT in the last 72 hours. BMET No results for input(s): NA, K, CL, CO2, GLUCOSE, BUN, CREATININE, CALCIUM in the last 72 hours. PT/INR No results for input(s): LABPROT, INR in the last 72 hours. CMP     Component Value Date/Time   NA 133 (L) 09/06/2021 0817   K 3.8 09/06/2021 0817   CL 97 (L) 09/06/2021 0817   CO2 22 09/06/2021 0817   GLUCOSE 146 (H) 09/06/2021 0817   BUN 15 09/06/2021 0817   CREATININE 0.86 09/06/2021 0817   CALCIUM 8.9 09/06/2021 0817   PROT 6.8 08/29/2021 1457   ALBUMIN 3.4 (L) 08/29/2021 1457   AST 37 08/29/2021 1457   ALT 19 08/29/2021 1457   ALKPHOS 73 08/29/2021 1457   BILITOT 0.5 08/29/2021 1457   GFRNONAA >60 09/06/2021 0817   Lipase  No results found for: LIPASE     Studies/Results: No results  found.  Anti-infectives: Anti-infectives (From admission, onward)    Start     Dose/Rate Route Frequency Ordered Stop   09/04/21 2200  ceFAZolin (ANCEF) IVPB 1 g/50 mL premix        1 g 100 mL/hr over 30 Minutes Intravenous Every 8 hours 09/04/21 1733 09/05/21 1354   09/04/21 1153  ceFAZolin (ANCEF) IVPB 2g/100 mL premix        2 g 200 mL/hr over 30 Minutes Intravenous 30 min pre-op 09/04/21 1153 09/04/21 1337   09/04/21 1105  ceFAZolin (ANCEF) 2-4 GM/100ML-% IVPB       Note to Pharmacy: Roosvelt Maser   : cabinet override      09/04/21 1105 09/04/21 1353   08/31/21 0300  ceFAZolin (ANCEF) IVPB 2g/100 mL premix        2 g 200 mL/hr over 30 Minutes Intravenous Every 8 hours 08/30/21 2238 08/31/21 1110        Assessment/Plan 7M [facility]   L rib fxs with PTX - CT placed in ED. CT out 9/19 and no PTX on pm cxr. L clavicle fx - non-op per Dr. Sammuel Hines, sling L scapula fx - non-op per Dr. Sammuel Hines, sling C6 spinous process and pedical fractures - s/p ACDF 9/15 by Dr. Fidela Juneau cord syndrome - expecting CIR needs, PT/OT maintaining spine precautions. Some improvement in exam.  T6 compression fx - s/p posterior arthrodesis 9/20 Dr. Marcello Moores.  DM -  poorly controlled at home, Hba1c 8.4, home meds, SSI FEN: carb mod diet Pain: APAP 1,000 mg q 6h, gabapentin 200 mg TID for nerve pain last increased 9/21, 50 mg tramadol q 6h, oxycodone 10 mg q 4h PRN, robaxin to PO 1,000 mg TID, completed course of toradol 9/26, ibuprofen 855m TID Foley -  failed voiding trial 9/21. Passed repeat TOV 9/24. Continue flomax ID: [id number], periop ABX VTE: PAS, LMWH    Dispo: Check BMP. Dulcolax suppository. Awaiting transfer to the floor. Medically stable for CIR.    LOS: 13 days    BWellington Hampshire PNorthridge Surgery CenterSurgery 09/11/2021, 8:41 AM Please see Amion for pager number during day hours 7:00am-4:30pm

## 2021-09-12 ENCOUNTER — Other Ambulatory Visit: Payer: Self-pay

## 2021-09-12 ENCOUNTER — Encounter (HOSPITAL_COMMUNITY): Payer: Self-pay

## 2021-09-12 LAB — GLUCOSE, CAPILLARY
Glucose-Capillary: 180 mg/dL — ABNORMAL HIGH (ref 70–99)
Glucose-Capillary: 147 mg/dL — ABNORMAL HIGH (ref 70–99)
Glucose-Capillary: 184 mg/dL — ABNORMAL HIGH (ref 70–99)
Glucose-Capillary: 192 mg/dL — ABNORMAL HIGH (ref 70–99)

## 2021-09-12 LAB — CBC
HCT: 29.4 % — ABNORMAL LOW (ref 39.0–52.0)
Hemoglobin: 10 g/dL — ABNORMAL LOW (ref 13.0–17.0)
MCH: 31.9 pg (ref 26.0–34.0)
MCHC: 34 g/dL (ref 30.0–36.0)
MCV: 93.9 fL (ref 80.0–100.0)
Platelets: 485 10*3/uL — ABNORMAL HIGH (ref 150–400)
RBC: 3.13 MIL/uL — ABNORMAL LOW (ref 4.22–5.81)
RDW: 13 % (ref 11.5–15.5)
WBC: 6.9 10*3/uL (ref 4.0–10.5)
nRBC: 0 % (ref 0.0–0.2)

## 2021-09-12 LAB — BASIC METABOLIC PANEL
Anion gap: 9 (ref 5–15)
BUN: 16 mg/dL (ref 6–20)
CO2: 28 mmol/L (ref 22–32)
Calcium: 9 mg/dL (ref 8.9–10.3)
Chloride: 92 mmol/L — ABNORMAL LOW (ref 98–111)
Creatinine, Ser: 0.57 mg/dL — ABNORMAL LOW (ref 0.61–1.24)
GFR, Estimated: >60 mL/min (ref >60)
Glucose, Bld: 211 mg/dL — ABNORMAL HIGH (ref 70–99)
Potassium: 4.5 mmol/L (ref 3.5–5.1)
Sodium: 129 mmol/L — ABNORMAL LOW (ref 135–145)

## 2021-09-12 MED ORDER — MAGNESIUM HYDROXIDE 400 MG/5ML PO SUSP
15.0000 mL | Freq: Once | ORAL | Status: AC
  Administered 2021-09-12: 15 mL via ORAL
  Filled 2021-09-12: qty 30

## 2021-09-12 MED ORDER — BISACODYL 10 MG RE SUPP
10.0000 mg | Freq: Once | RECTAL | Status: AC
  Administered 2021-09-12: 10 mg via RECTAL
  Filled 2021-09-12: qty 1

## 2021-09-12 MED ORDER — SODIUM CHLORIDE 0.9 % IV SOLN: INTRAVENOUS | Status: DC

## 2021-09-12 MED ORDER — SODIUM CHLORIDE 1 G PO TABS
1.0000 g | ORAL_TABLET | Freq: Three times a day (TID) | ORAL | Status: DC
  Administered 2021-09-12 – 2021-09-13 (×3): 1 g via ORAL
  Filled 2021-09-12 (×4): qty 1

## 2021-09-12 MED ORDER — SODIUM CHLORIDE 0.9 % IV BOLUS
500.0000 mL | Freq: Once | INTRAVENOUS | Status: AC
  Administered 2021-09-12: 500 mL via INTRAVENOUS

## 2021-09-12 MED ORDER — POLYETHYLENE GLYCOL 3350 17 G PO PACK
17.0000 g | PACK | Freq: Two times a day (BID) | ORAL | Status: DC
  Administered 2021-09-12 – 2021-09-13 (×2): 17 g via ORAL
  Filled 2021-09-12 (×2): qty 1
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
## 2021-09-12 NOTE — Progress Notes (Signed)
Inpatient Rehab Admissions Coordinator:   I do have insurance authorization for this patient, however, I do not have a bed for him to admit today. I will follow for admission pending bed availability.   Shann Medal, PT, DPT Admissions Coordinator [phone] 09/12/21  10:35 AM

## 2021-09-12 NOTE — Progress Notes (Addendum)
8 Days Post-Op  Subjective: CC: PT/OT reports that patients pressures dropped again with therapies and was orthostatic when going from lying to sitting and sitting to standing. Patient reports with position changes he gets dizzy/lightheaded and feels like he is going to black out but this resolves with rest. Reports he hasn't been able to eat any of breakfast as he cannot feed himself. OT reports grasp is improving but can be very fatiguing to try to feed an entire meal. He reports family is there for lunch and dinner so he is able to finish all of his tray. Pain mainly of the  LUE and L chest that is worse with movement well controlled with current medications. No n/t. MAE's. No abdominal pain, n/v. Reports last bm was 4 days ago and hard which is consistent with note yesterday. Passing flatus.   Objective: Vital signs in last 24 hours: Temp:  [97.6 F (36.4 C)-98.9 F (37.2 C)] 97.6 F (36.4 C) (09/28 0752) Pulse Rate:  [80-88] 85 (09/28 0752) Resp:  [16-18] 18 (09/28 0752) BP: (95-118)/(66-83) 107/73 (09/28 0752) SpO2:  [95 %-100 %] 99 % (09/28 0752) Last BM Date: 09/11/21  Intake/Output from previous day: 09/27 0701 - 09/28 0700 In: 813 [P.O.:810; I.V.:3] Out: 3300 [Urine:3300] Intake/Output this shift: No intake/output data recorded.  PE: Gen: Alert, NAD Neuro: good strength BLE. Moving BUE but with weakness HEENT: PERRL CV: RRR, palpable pedal pulses bilaterally Pulm: CTAB, rate and effort normal Abd: soft, ND, NT, +BS Extr: calves soft and nontender without edema  Lab Results:  No results for input(s): WBC, HGB, HCT, PLT in the last 72 hours. BMET Recent Labs    09/11/21 0822  NA 131*  K 4.8  CL 93*  CO2 28  GLUCOSE 153*  BUN 14  CREATININE 0.57*  CALCIUM 9.3   PT/INR No results for input(s): LABPROT, INR in the last 72 hours. CMP     Component Value Date/Time   NA 131 (L) 09/11/2021 0822   K 4.8 09/11/2021 0822   CL 93 (L) 09/11/2021 0822   CO2 28  09/11/2021 0822   GLUCOSE 153 (H) 09/11/2021 0822   BUN 14 09/11/2021 0822   CREATININE 0.57 (L) 09/11/2021 0822   CALCIUM 9.3 09/11/2021 0822   PROT 6.8 08/29/2021 1457   ALBUMIN 3.4 (L) 08/29/2021 1457   AST 37 08/29/2021 1457   ALT 19 08/29/2021 1457   ALKPHOS 73 08/29/2021 1457   BILITOT 0.5 08/29/2021 1457   GFRNONAA >60 09/11/2021 0822   Lipase  No results found for: LIPASE  Studies/Results: No results found.  Anti-infectives: Anti-infectives (From admission, onward)    Start     Dose/Rate Route Frequency Ordered Stop   09/04/21 2200  ceFAZolin (ANCEF) IVPB 1 g/50 mL premix        1 g 100 mL/hr over 30 Minutes Intravenous Every 8 hours 09/04/21 1733 09/05/21 1354   09/04/21 1153  ceFAZolin (ANCEF) IVPB 2g/100 mL premix        2 g 200 mL/hr over 30 Minutes Intravenous 30 min pre-op 09/04/21 1153 09/04/21 1337   09/04/21 1105  ceFAZolin (ANCEF) 2-4 GM/100ML-% IVPB       Note to Pharmacy: Roosvelt Maser   : cabinet override      09/04/21 1105 09/04/21 1353   08/31/21 0300  ceFAZolin (ANCEF) IVPB 2g/100 mL premix        2 g 200 mL/hr over 30 Minutes Intravenous Every 8 hours 08/30/21 2238 08/31/21 1110  Assessment/Plan 38M [facility] L rib fxs with PTX - CT placed in ED. CT out 9/19 and no PTX on pm cxr. L clavicle fx - non-op per Dr. Sammuel Hines, sling L scapula fx - non-op per Dr. Sammuel Hines, sling C6 spinous process and pedical fractures - s/p ACDF 9/15 by Dr. Fidela Juneau cord syndrome - expecting CIR needs, PT/OT maintaining spine precautions. Some improvement in exam.  T6 compression fx - s/p posterior arthrodesis 9/20 Dr. Marcello Moores.  DM - poorly controlled at home, Hba1c 8.4, home meds, SSI FEN: carb mod diet Pain: APAP 1,000 mg q 6h, gabapentin 200 mg TID for nerve pain last increased 9/21, 50 mg tramadol q 6h, oxycodone 10 mg q 4h PRN, robaxin to PO 1,000 mg TID, completed course of toradol 9/26, ibuprofen 830m TID Foley -  failed voiding trial 9/21. Passed  repeat TOV 9/24. Continue flomax ID: [id number], periop ABX VTE: PAS, LMWH  Dispo: Orthostatic with PT/OT. Will give another bolus and check labs. Could be neurogenic in setting of above injuries. Awaiting CIR.    LOS: 14 days    MJillyn Ledger, PDahl Memorial Healthcare AssociationSurgery 09/12/2021, 9:10 AM Please see Amion for pager number during day hours 7:00am-4:30pm
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
## 2021-09-13 ENCOUNTER — Inpatient Hospital Stay (HOSPITAL_COMMUNITY)
Admission: RE | Admit: 2021-09-13 | Discharge: 2021-10-04 | DRG: 945 | Disposition: A | Discharge status: Home Health | Payer: BC Managed Care – PPO | Source: Intra-hospital | Attending: Physical Medicine and Rehabilitation | Admitting: Physical Medicine and Rehabilitation

## 2021-09-13 ENCOUNTER — Encounter (HOSPITAL_BASED_OUTPATIENT_CLINIC_OR_DEPARTMENT_OTHER): Payer: Self-pay | Admitting: Emergency Medicine

## 2021-09-13 ENCOUNTER — Encounter (HOSPITAL_COMMUNITY): Payer: Self-pay | Admitting: Physical Medicine and Rehabilitation

## 2021-09-13 DIAGNOSIS — G8254 Quadriplegia, C5-C7 incomplete: Secondary | ICD-10-CM | POA: Diagnosis present

## 2021-09-13 DIAGNOSIS — S2242XA Multiple fractures of ribs, left side, initial encounter for closed fracture: Secondary | ICD-10-CM | POA: Diagnosis not present

## 2021-09-13 DIAGNOSIS — R112 Nausea with vomiting, unspecified: Secondary | ICD-10-CM | POA: Diagnosis not present

## 2021-09-13 DIAGNOSIS — S270XXD Traumatic pneumothorax, subsequent encounter: Secondary | ICD-10-CM | POA: Diagnosis not present

## 2021-09-13 DIAGNOSIS — E1165 Type 2 diabetes mellitus with hyperglycemia: Secondary | ICD-10-CM | POA: Diagnosis not present

## 2021-09-13 DIAGNOSIS — Z981 Arthrodesis status: Secondary | ICD-10-CM

## 2021-09-13 DIAGNOSIS — E871 Hypo-osmolality and hyponatremia: Secondary | ICD-10-CM

## 2021-09-13 DIAGNOSIS — G8250 Quadriplegia, unspecified: Secondary | ICD-10-CM | POA: Diagnosis present

## 2021-09-13 DIAGNOSIS — R270 Ataxia, unspecified: Secondary | ICD-10-CM | POA: Diagnosis not present

## 2021-09-13 DIAGNOSIS — S14126D Central cord syndrome at C6 level of cervical spinal cord, subsequent encounter: Secondary | ICD-10-CM | POA: Diagnosis not present

## 2021-09-13 DIAGNOSIS — S42032D Displaced fracture of lateral end of left clavicle, subsequent encounter for fracture with routine healing: Secondary | ICD-10-CM | POA: Diagnosis not present

## 2021-09-13 DIAGNOSIS — K5900 Constipation, unspecified: Secondary | ICD-10-CM | POA: Diagnosis present

## 2021-09-13 DIAGNOSIS — Z79899 Other long term (current) drug therapy: Secondary | ICD-10-CM

## 2021-09-13 DIAGNOSIS — K592 Neurogenic bowel, not elsewhere classified: Secondary | ICD-10-CM | POA: Diagnosis not present

## 2021-09-13 DIAGNOSIS — F4322 Adjustment disorder with anxiety: Secondary | ICD-10-CM | POA: Diagnosis not present

## 2021-09-13 DIAGNOSIS — E119 Type 2 diabetes mellitus without complications: Secondary | ICD-10-CM | POA: Diagnosis not present

## 2021-09-13 DIAGNOSIS — N319 Neuromuscular dysfunction of bladder, unspecified: Secondary | ICD-10-CM | POA: Diagnosis present

## 2021-09-13 DIAGNOSIS — Z681 Body mass index (BMI) 19 or less, adult: Secondary | ICD-10-CM | POA: Diagnosis not present

## 2021-09-13 DIAGNOSIS — F1090 Alcohol use, unspecified, uncomplicated: Secondary | ICD-10-CM | POA: Diagnosis present

## 2021-09-13 DIAGNOSIS — J939 Pneumothorax, unspecified: Secondary | ICD-10-CM | POA: Diagnosis not present

## 2021-09-13 DIAGNOSIS — S42102D Fracture of unspecified part of scapula, left shoulder, subsequent encounter for fracture with routine healing: Secondary | ICD-10-CM

## 2021-09-13 DIAGNOSIS — R338 Other retention of urine: Secondary | ICD-10-CM | POA: Diagnosis present

## 2021-09-13 DIAGNOSIS — F419 Anxiety disorder, unspecified: Secondary | ICD-10-CM | POA: Diagnosis present

## 2021-09-13 DIAGNOSIS — F32A Depression, unspecified: Secondary | ICD-10-CM | POA: Diagnosis present

## 2021-09-13 DIAGNOSIS — T07XXXA Unspecified multiple injuries, initial encounter: Secondary | ICD-10-CM | POA: Diagnosis present

## 2021-09-13 DIAGNOSIS — S27321D Contusion of lung, unilateral, subsequent encounter: Secondary | ICD-10-CM

## 2021-09-13 DIAGNOSIS — I959 Hypotension, unspecified: Secondary | ICD-10-CM | POA: Diagnosis present

## 2021-09-13 DIAGNOSIS — E785 Hyperlipidemia, unspecified: Secondary | ICD-10-CM | POA: Diagnosis present

## 2021-09-13 DIAGNOSIS — I951 Orthostatic hypotension: Secondary | ICD-10-CM | POA: Diagnosis present

## 2021-09-13 DIAGNOSIS — Z7984 Long term (current) use of oral hypoglycemic drugs: Secondary | ICD-10-CM

## 2021-09-13 DIAGNOSIS — S022XXD Fracture of nasal bones, subsequent encounter for fracture with routine healing: Secondary | ICD-10-CM | POA: Diagnosis not present

## 2021-09-13 DIAGNOSIS — D62 Acute posthemorrhagic anemia: Secondary | ICD-10-CM | POA: Diagnosis not present

## 2021-09-13 DIAGNOSIS — S12500D Unspecified displaced fracture of sixth cervical vertebra, subsequent encounter for fracture with routine healing: Secondary | ICD-10-CM

## 2021-09-13 DIAGNOSIS — E43 Unspecified severe protein-calorie malnutrition: Secondary | ICD-10-CM | POA: Diagnosis not present

## 2021-09-13 DIAGNOSIS — F329 Major depressive disorder, single episode, unspecified: Secondary | ICD-10-CM | POA: Diagnosis not present

## 2021-09-13 DIAGNOSIS — S225XXD Flail chest, subsequent encounter for fracture with routine healing: Secondary | ICD-10-CM

## 2021-09-13 DIAGNOSIS — N401 Enlarged prostate with lower urinary tract symptoms: Secondary | ICD-10-CM | POA: Diagnosis present

## 2021-09-13 DIAGNOSIS — Z800 Family history of malignant neoplasm of digestive organs: Secondary | ICD-10-CM

## 2021-09-13 DIAGNOSIS — M7989 Other specified soft tissue disorders: Secondary | ICD-10-CM | POA: Diagnosis not present

## 2021-09-13 DIAGNOSIS — R64 Cachexia: Secondary | ICD-10-CM | POA: Diagnosis not present

## 2021-09-13 DIAGNOSIS — S22051D Stable burst fracture of T5-T6 vertebra, subsequent encounter for fracture with routine healing: Secondary | ICD-10-CM

## 2021-09-13 DIAGNOSIS — Z833 Family history of diabetes mellitus: Secondary | ICD-10-CM

## 2021-09-13 DIAGNOSIS — Z7982 Long term (current) use of aspirin: Secondary | ICD-10-CM

## 2021-09-13 DIAGNOSIS — Z8249 Family history of ischemic heart disease and other diseases of the circulatory system: Secondary | ICD-10-CM

## 2021-09-13 LAB — CBC
HCT: 29 % — ABNORMAL LOW (ref 39.0–52.0)
Hemoglobin: 9.9 g/dL — ABNORMAL LOW (ref 13.0–17.0)
MCH: 31.9 pg (ref 26.0–34.0)
MCHC: 34.1 g/dL (ref 30.0–36.0)
MCV: 93.5 fL (ref 80.0–100.0)
Platelets: 486 10*3/uL — ABNORMAL HIGH (ref 150–400)
RBC: 3.1 MIL/uL — ABNORMAL LOW (ref 4.22–5.81)
RDW: 13 % (ref 11.5–15.5)
WBC: 6.9 10*3/uL (ref 4.0–10.5)
nRBC: 0 % (ref 0.0–0.2)

## 2021-09-13 LAB — GLUCOSE, CAPILLARY
Glucose-Capillary: 159 mg/dL — ABNORMAL HIGH (ref 70–99)
Glucose-Capillary: 152 mg/dL — ABNORMAL HIGH (ref 70–99)
Glucose-Capillary: 166 mg/dL — ABNORMAL HIGH (ref 70–99)
Glucose-Capillary: 175 mg/dL — ABNORMAL HIGH (ref 70–99)

## 2021-09-13 LAB — CREATININE, SERUM
Creatinine, Ser: 0.7 mg/dL (ref 0.61–1.24)
GFR, Estimated: >60 mL/min (ref >60)

## 2021-09-13 MED ORDER — ENSURE ENLIVE PO LIQD
237.0000 mL | Freq: Three times a day (TID) | ORAL | Status: DC
  Administered 2021-09-13 – 2021-09-17 (×13): 237 mL via ORAL

## 2021-09-13 MED ORDER — SODIUM CHLORIDE 1 G PO TABS
1.0000 g | ORAL_TABLET | Freq: Three times a day (TID) | ORAL | Status: DC
  Administered 2021-09-13 – 2021-10-04 (×62): 1 g via ORAL
  Filled 2021-09-13 (×62): qty 1

## 2021-09-13 MED ORDER — THIAMINE HCL 100 MG/ML IJ SOLN
100.0000 mg | Freq: Every day | INTRAMUSCULAR | Status: DC
  Filled 2021-09-13 (×5): qty 1

## 2021-09-13 MED ORDER — METHOCARBAMOL 500 MG PO TABS
1000.0000 mg | ORAL_TABLET | Freq: Three times a day (TID) | ORAL | Status: DC
  Administered 2021-09-13 – 2021-10-04 (×62): 1000 mg via ORAL
  Filled 2021-09-13 (×62): qty 2

## 2021-09-13 MED ORDER — IBUPROFEN 800 MG PO TABS
800.0000 mg | ORAL_TABLET | Freq: Three times a day (TID) | ORAL | Status: DC
  Administered 2021-09-13 – 2021-09-18 (×14): 800 mg via ORAL
  Filled 2021-09-13 (×15): qty 1

## 2021-09-13 MED ORDER — GABAPENTIN 100 MG PO CAPS
200.0000 mg | ORAL_CAPSULE | Freq: Three times a day (TID) | ORAL | Status: DC
  Administered 2021-09-13 – 2021-10-04 (×62): 200 mg via ORAL
  Filled 2021-09-13 (×63): qty 2

## 2021-09-13 MED ORDER — ADULT MULTIVITAMIN W/MINERALS CH
1.0000 | ORAL_TABLET | Freq: Every day | ORAL | Status: DC
  Administered 2021-09-14 – 2021-10-04 (×21): 1 via ORAL
  Filled 2021-09-13 (×21): qty 1

## 2021-09-13 MED ORDER — EMPAGLIFLOZIN 10 MG PO TABS
10.0000 mg | ORAL_TABLET | Freq: Every day | ORAL | Status: DC
  Administered 2021-09-14 – 2021-10-04 (×21): 10 mg via ORAL
  Filled 2021-09-13 (×21): qty 1

## 2021-09-13 MED ORDER — INSULIN ASPART 100 UNIT/ML IJ SOLN
0.0000 [IU] | Freq: Three times a day (TID) | INTRAMUSCULAR | Status: DC
  Administered 2021-09-13 – 2021-09-14 (×2): 3 [IU] via SUBCUTANEOUS
  Administered 2021-09-14: 2 [IU] via SUBCUTANEOUS
  Administered 2021-09-14: 4 [IU] via SUBCUTANEOUS
  Administered 2021-09-15: 2 [IU] via SUBCUTANEOUS
  Administered 2021-09-15: 5 [IU] via SUBCUTANEOUS
  Administered 2021-09-15: 3 [IU] via SUBCUTANEOUS
  Administered 2021-09-16: 2 [IU] via SUBCUTANEOUS
  Administered 2021-09-16 (×2): 5 [IU] via SUBCUTANEOUS
  Administered 2021-09-17: 2 [IU] via SUBCUTANEOUS
  Administered 2021-09-17 (×2): 5 [IU] via SUBCUTANEOUS
  Administered 2021-09-18: 3 [IU] via SUBCUTANEOUS
  Administered 2021-09-18: 2 [IU] via SUBCUTANEOUS
  Administered 2021-09-18 – 2021-09-19 (×2): 3 [IU] via SUBCUTANEOUS
  Administered 2021-09-19 (×2): 2 [IU] via SUBCUTANEOUS
  Administered 2021-09-20 (×2): 3 [IU] via SUBCUTANEOUS
  Administered 2021-09-20: 2 [IU] via SUBCUTANEOUS
  Administered 2021-09-21 (×3): 3 [IU] via SUBCUTANEOUS
  Administered 2021-09-22: 2 [IU] via SUBCUTANEOUS
  Administered 2021-09-22 – 2021-09-23 (×2): 3 [IU] via SUBCUTANEOUS
  Administered 2021-09-24 (×2): 2 [IU] via SUBCUTANEOUS
  Administered 2021-09-24: 3 [IU] via SUBCUTANEOUS
  Administered 2021-09-25 (×3): 2 [IU] via SUBCUTANEOUS
  Administered 2021-09-26: 3 [IU] via SUBCUTANEOUS
  Administered 2021-09-26: 2 [IU] via SUBCUTANEOUS
  Administered 2021-09-27: 5 [IU] via SUBCUTANEOUS
  Administered 2021-09-27: 3 [IU] via SUBCUTANEOUS
  Administered 2021-09-28: 2 [IU] via SUBCUTANEOUS
  Administered 2021-09-28 – 2021-09-29 (×2): 3 [IU] via SUBCUTANEOUS
  Administered 2021-09-30: 2 [IU] via SUBCUTANEOUS

## 2021-09-13 MED ORDER — TAMSULOSIN HCL 0.4 MG PO CAPS
0.4000 mg | ORAL_CAPSULE | Freq: Every day | ORAL | Status: DC
  Administered 2021-09-14 – 2021-10-01 (×18): 0.4 mg via ORAL
  Filled 2021-09-13 (×18): qty 1

## 2021-09-13 MED ORDER — ONDANSETRON HCL 4 MG/2ML IJ SOLN
4.0000 mg | Freq: Four times a day (QID) | INTRAMUSCULAR | Status: DC | PRN
  Filled 2021-09-13: qty 2

## 2021-09-13 MED ORDER — ASPIRIN EC 81 MG PO TBEC
81.0000 mg | DELAYED_RELEASE_TABLET | Freq: Every day | ORAL | Status: DC
  Administered 2021-09-14 – 2021-10-04 (×21): 81 mg via ORAL
  Filled 2021-09-13 (×21): qty 1

## 2021-09-13 MED ORDER — FOLIC ACID 1 MG PO TABS
1.0000 mg | ORAL_TABLET | Freq: Every day | ORAL | Status: DC
  Administered 2021-09-14 – 2021-10-04 (×21): 1 mg via ORAL
  Filled 2021-09-13 (×21): qty 1

## 2021-09-13 MED ORDER — POLYETHYLENE GLYCOL 3350 17 G PO PACK
17.0000 g | PACK | Freq: Two times a day (BID) | ORAL | Status: DC
  Administered 2021-09-14 – 2021-09-29 (×20): 17 g via ORAL
  Filled 2021-09-13 (×37): qty 1

## 2021-09-13 MED ORDER — ONDANSETRON HCL 4 MG PO TABS
4.0000 mg | ORAL_TABLET | Freq: Four times a day (QID) | ORAL | Status: DC | PRN
  Administered 2021-09-16 – 2021-09-27 (×9): 4 mg via ORAL
  Filled 2021-09-13 (×9): qty 1

## 2021-09-13 MED ORDER — BISACODYL 10 MG RE SUPP
10.0000 mg | Freq: Every day | RECTAL | Status: DC | PRN
  Administered 2021-09-13 – 2021-09-20 (×2): 10 mg via RECTAL
  Filled 2021-09-13 (×2): qty 1

## 2021-09-13 MED ORDER — THIAMINE HCL 100 MG PO TABS
100.0000 mg | ORAL_TABLET | Freq: Every day | ORAL | Status: DC
  Administered 2021-09-14 – 2021-10-04 (×21): 100 mg via ORAL
  Filled 2021-09-13 (×21): qty 1

## 2021-09-13 MED ORDER — MIDODRINE HCL 5 MG PO TABS
5.0000 mg | ORAL_TABLET | Freq: Three times a day (TID) | ORAL | Status: DC
  Administered 2021-09-14: 5 mg via ORAL
  Filled 2021-09-13: qty 1

## 2021-09-13 MED ORDER — SORBITOL 70 % SOLN
60.0000 mL | Freq: Once | Status: AC
  Administered 2021-09-13: 60 mL via ORAL
  Filled 2021-09-13: qty 60

## 2021-09-13 MED ORDER — OXYCODONE HCL 5 MG PO TABS
5.0000 mg | ORAL_TABLET | ORAL | Status: DC | PRN
  Administered 2021-09-13 – 2021-09-14 (×4): 10 mg via ORAL
  Filled 2021-09-13 (×4): qty 2

## 2021-09-13 MED ORDER — DOCUSATE SODIUM 100 MG PO CAPS
100.0000 mg | ORAL_CAPSULE | Freq: Two times a day (BID) | ORAL | Status: DC
  Administered 2021-09-14 – 2021-10-04 (×37): 100 mg via ORAL
  Filled 2021-09-13 (×40): qty 1

## 2021-09-13 MED ORDER — METFORMIN HCL 500 MG PO TABS
1000.0000 mg | ORAL_TABLET | Freq: Two times a day (BID) | ORAL | Status: DC
  Administered 2021-09-13 – 2021-10-04 (×42): 1000 mg via ORAL
  Filled 2021-09-13 (×43): qty 2

## 2021-09-13 MED ORDER — SORBITOL 70 % SOLN
30.0000 mL | Freq: Every day | Status: DC | PRN
  Administered 2021-09-20: 30 mL via ORAL
  Filled 2021-09-13 (×2): qty 30

## 2021-09-13 MED ORDER — ENOXAPARIN SODIUM 30 MG/0.3ML IJ SOSY
30.0000 mg | PREFILLED_SYRINGE | Freq: Two times a day (BID) | INTRAMUSCULAR | Status: DC
  Administered 2021-09-13 – 2021-10-04 (×42): 30 mg via SUBCUTANEOUS
  Filled 2021-09-13 (×43): qty 0.3

## 2021-09-13 MED ORDER — GEMFIBROZIL 600 MG PO TABS
600.0000 mg | ORAL_TABLET | Freq: Two times a day (BID) | ORAL | Status: DC
  Administered 2021-09-13 – 2021-10-04 (×41): 600 mg via ORAL
  Filled 2021-09-13 (×43): qty 1

## 2021-09-13 MED ORDER — ALPRAZOLAM 0.25 MG PO TABS
0.2500 mg | ORAL_TABLET | Freq: Two times a day (BID) | ORAL | Status: DC | PRN
  Administered 2021-09-13 – 2021-10-03 (×15): 0.25 mg via ORAL
  Filled 2021-09-13 (×15): qty 1

## 2021-09-13 MED ORDER — TRAMADOL HCL 50 MG PO TABS
50.0000 mg | ORAL_TABLET | Freq: Four times a day (QID) | ORAL | Status: DC
  Administered 2021-09-13 – 2021-09-30 (×63): 50 mg via ORAL
  Filled 2021-09-13 (×69): qty 1

## 2021-09-13 MED ORDER — B COMPLEX-C PO TABS
1.0000 | ORAL_TABLET | Freq: Every day | ORAL | Status: DC
  Administered 2021-09-14 – 2021-10-04 (×21): 1 via ORAL
  Filled 2021-09-13 (×23): qty 1

## 2021-09-13 MED ORDER — ENOXAPARIN SODIUM 30 MG/0.3ML IJ SOSY: 30.0000 mg | PREFILLED_SYRINGE | Freq: Two times a day (BID) | INTRAMUSCULAR | Status: DC

## 2021-09-13 MED ORDER — ACETAMINOPHEN 325 MG PO TABS
325.0000 mg | ORAL_TABLET | ORAL | Status: DC | PRN
  Administered 2021-09-14 – 2021-10-03 (×5): 650 mg via ORAL
  Filled 2021-09-13 (×5): qty 2

## 2021-09-13 MED ORDER — SENNA 8.6 MG PO TABS
1.0000 | ORAL_TABLET | Freq: Every day | ORAL | Status: DC
  Administered 2021-09-14 – 2021-10-03 (×11): 8.6 mg via ORAL
  Filled 2021-09-13 (×18): qty 1
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
## 2021-09-13 NOTE — Progress Notes (Signed)
Patient transferred to CIR. RN received report and notice of patient arrival.   Patient has belongings at bedside. Family aware of transfer.

## 2021-09-13 NOTE — TOC Transition Note (Signed)
Transition of Care [facility]) - CM/SW Discharge Note   Patient Details  Name: Carlos [name]. MRN: [id number] Date of Birth: December 30, 1970  [facility]) CM/SW Contact:  Ella Bodo, RN Phone Number: 09/13/2021, 11:52 AM   Clinical Narrative:    Pt medically stable and has been approved by insurance for admission to [facility] IP Rehab.  Plan discharge to CIR today upon bed availability.    Final next level of care: IP Rehab Facility Barriers to Discharge: Barriers Resolved   Patient Goals and CMS Choice Patient states their goals for this hospitalization and ongoing recovery are:: to go home CMS Medicare.gov Compare Post Acute Care list provided to:: Patient Choice offered to / list presented to : Patient                        Discharge Plan and Services   Discharge Planning Services: CM Consult Post Acute Care Choice: IP Rehab                               Social Determinants of Health (SDOH) Interventions     Readmission Risk Interventions No flowsheet data found.  Reinaldo Raddle, RN, BSN  Trauma/Neuro ICU Case Manager [phone]
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
## 2021-09-13 NOTE — Discharge Instructions (Addendum)
Inpatient Rehab Discharge Instructions  Carlos [name]. Discharge date and time: No discharge date for patient encounter.   Activities/Precautions/ Functional Status: Activity: Cervical collar as directed/nonweightbearing left upper extremity Diet: Regular Wound Care: Routine skin checks Functional status:  ___ No restrictions     ___ Walk up steps independently ___ 24/7 supervision/assistance   ___ Walk up steps with assistance ___ Intermittent supervision/assistance  ___ Bathe/dress independently ___ Walk with walker     _x__ Bathe/dress with assistance ___ Walk Independently    ___ Shower independently ___ Walk with assistance    ___ Shower with assistance ___ No alcohol     ___ Return to work/school ________   COMMUNITY REFERRALS UPON DISCHARGE:    Outpatient: PT     OT                 Agency:[facility]                         Address: [address] [place], [place], [place] [address]                       YOMAY:[phone]              Appointment Date/Time:*Please expect follow-up within 7-10 business days to schedule your appointment. If you have not received follow-up, be sure to contact the site directly.*  Medical Equipment/Items Ordered:wheelchair, and leg extenders (to be shipped to the home)                                                 Agency/Supplier:[facility] [phone]    Special Instructions: No driving smoking or alcohol   My questions have been answered and I understand these instructions. I will adhere to these goals and the provided educational materials after my discharge from the hospital.  Patient/Caregiver Signature _______________________________ Date __________  Clinician Signature _______________________________________ Date __________  Please bring this form and your medication list with you to all your follow-up doctor's appointments.

## 2021-09-13 NOTE — Progress Notes (Addendum)
9 Days Post-Op  Subjective: CC: Pain in his right shoulder is well controlled with medications. No other areas of pain. Tolerating diet without n/v or abdominal pain. BM x 2 yesterday. Voiding.    Going to try to wrap legs with therapies today to see if this helps with his orthostatic hypotension as suspect this may be neurogenic.   Objective: Vital signs in last 24 hours: Temp:  [97.5 F (36.4 C)-98 F (36.7 C)] 97.9 F (36.6 C) (09/29 0751) Pulse Rate:  [85-92] 92 (09/29 0751) Resp:  [16-20] 19 (09/29 0751) BP: (102-111)/(67-81) 104/81 (09/29 0751) SpO2:  [97 %-99 %] 97 % (09/29 0751) Last BM Date: 09/12/21  Intake/Output from previous day: 09/28 0701 - 09/29 0700 In: 880 [P.O.:880] Out: 1251 [Urine:1250; Stool:1] Intake/Output this shift: Total I/O In: 75 [P.O.:75] Out: 250 [Urine:250]  PE: Gen: Alert, NAD Neuro: good strength BLE. Moving BUE but with weakness HEENT: PERRL CV: RRR, palpable pedal pulses bilaterally Pulm: CTAB, rate and effort normal Abd: soft, ND, NT, +BS Extr: calves soft and nontender without edema  Lab Results:  Recent Labs    09/12/21 1009  WBC 6.9  HGB 10.0*  HCT 29.4*  PLT 485*   BMET Recent Labs    09/11/21 0822 09/12/21 1009  NA 131* 129*  K 4.8 4.5  CL 93* 92*  CO2 28 28  GLUCOSE 153* 211*  BUN 14 16  CREATININE 0.57* 0.57*  CALCIUM 9.3 9.0   PT/INR No results for input(s): LABPROT, INR in the last 72 hours. CMP     Component Value Date/Time   NA 129 (L) 09/12/2021 1009   K 4.5 09/12/2021 1009   CL 92 (L) 09/12/2021 1009   CO2 28 09/12/2021 1009   GLUCOSE 211 (H) 09/12/2021 1009   BUN 16 09/12/2021 1009   CREATININE 0.57 (L) 09/12/2021 1009   CALCIUM 9.0 09/12/2021 1009   PROT 6.8 08/29/2021 1457   ALBUMIN 3.4 (L) 08/29/2021 1457   AST 37 08/29/2021 1457   ALT 19 08/29/2021 1457   ALKPHOS 73 08/29/2021 1457   BILITOT 0.5 08/29/2021 1457   GFRNONAA >60 09/12/2021 1009   Lipase  No results found for:  LIPASE  Studies/Results: No results found.  Anti-infectives: Anti-infectives (From admission, onward)    Start     Dose/Rate Route Frequency Ordered Stop   09/04/21 2200  ceFAZolin (ANCEF) IVPB 1 g/50 mL premix        1 g 100 mL/hr over 30 Minutes Intravenous Every 8 hours 09/04/21 1733 09/05/21 1354   09/04/21 1153  ceFAZolin (ANCEF) IVPB 2g/100 mL premix        2 g 200 mL/hr over 30 Minutes Intravenous 30 min pre-op 09/04/21 1153 09/04/21 1337   09/04/21 1105  ceFAZolin (ANCEF) 2-4 GM/100ML-% IVPB       Note to Pharmacy: Roosvelt Maser   : cabinet override      09/04/21 1105 09/04/21 1353   08/31/21 0300  ceFAZolin (ANCEF) IVPB 2g/100 mL premix        2 g 200 mL/hr over 30 Minutes Intravenous Every 8 hours 08/30/21 2238 08/31/21 1110        Assessment/Plan 41M [facility] L rib fxs with PTX - CT placed in ED. CT out 9/19 and no PTX on pm cxr. L clavicle fx - non-op per Dr. Sammuel Hines, sling L scapula fx - non-op per Dr. Sammuel Hines, sling C6 spinous process and pedical fractures - s/p ACDF 9/15 by Dr. Marcello Moores. C-Collar when  oob per NSGY note on 9/22 Central cord syndrome - Cont therapies. Some improvement in exam.  T6 compression fx - s/p posterior arthrodesis 9/20 Dr. Marcello Moores.  DM - poorly controlled at home, Hba1c 8.4, home meds, SSI FEN: carb mod diet Pain: APAP 1,000 mg q 6h, gabapentin 200 mg TID for nerve pain last increased 9/21, 50 mg tramadol q 6h, oxycodone 10 mg q 4h PRN, robaxin to PO 1,000 mg TID, completed course of toradol 9/26, ibuprofen 850m TID Hyponatremia - salt tabs started 9/29 Foley -  failed voiding trial 9/21. Passed repeat TOV 9/24. Continue flomax ID: [id number], periop ABX VTE: PAS, LMWH  Dispo: Awaiting CIR.    LOS: 15 days    MJillyn Ledger, [facility] 09/13/2021, 9:19 AM Please see Amion for pager number during day hours 7:00am-4:30pm
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
## 2021-09-13 NOTE — Progress Notes (Signed)
Inpatient Rehab Admissions Coordinator:    I have insurance approval and a bed available for pt to admit to CIR today. Trauma service in agreement.  Will let pt/family and TOC team know.   Shann Medal, PT, DPT Admissions Coordinator [phone] 09/13/21  10:04 AM

## 2021-09-14 ENCOUNTER — Ambulatory Visit (HOSPITAL_COMMUNITY): Payer: BC Managed Care – PPO

## 2021-09-14 DIAGNOSIS — M7989 Other specified soft tissue disorders: Secondary | ICD-10-CM

## 2021-09-14 LAB — CBC WITH DIFFERENTIAL/PLATELET
Abs Immature Granulocytes: 0.3 10*3/uL — ABNORMAL HIGH (ref 0.00–0.07)
Basophils Absolute: 0.1 10*3/uL (ref 0.0–0.1)
Basophils Relative: 1 %
Eosinophils Absolute: 0.1 10*3/uL (ref 0.0–0.5)
Eosinophils Relative: 2 %
HCT: 32 % — ABNORMAL LOW (ref 39.0–52.0)
Hemoglobin: 10.8 g/dL — ABNORMAL LOW (ref 13.0–17.0)
Immature Granulocytes: 4 %
Lymphocytes Relative: 23 %
Lymphs Abs: 1.6 10*3/uL (ref 0.7–4.0)
MCH: 31.8 pg (ref 26.0–34.0)
MCHC: 33.8 g/dL (ref 30.0–36.0)
MCV: 94.1 fL (ref 80.0–100.0)
Monocytes Absolute: 0.8 10*3/uL (ref 0.1–1.0)
Monocytes Relative: 10 %
Neutro Abs: 4.4 10*3/uL (ref 1.7–7.7)
Neutrophils Relative %: 60 %
Platelets: 531 10*3/uL — ABNORMAL HIGH (ref 150–400)
RBC: 3.4 MIL/uL — ABNORMAL LOW (ref 4.22–5.81)
RDW: 13.1 % (ref 11.5–15.5)
WBC: 7.2 10*3/uL (ref 4.0–10.5)
nRBC: 0 % (ref 0.0–0.2)

## 2021-09-14 LAB — COMPREHENSIVE METABOLIC PANEL
ALT: 14 U/L (ref 0–44)
AST: 16 U/L (ref 15–41)
Albumin: 3.4 g/dL — ABNORMAL LOW (ref 3.5–5.0)
Alkaline Phosphatase: 156 U/L — ABNORMAL HIGH (ref 38–126)
Anion gap: 10 (ref 5–15)
BUN: 14 mg/dL (ref 6–20)
CO2: 29 mmol/L (ref 22–32)
Calcium: 9.6 mg/dL (ref 8.9–10.3)
Chloride: 94 mmol/L — ABNORMAL LOW (ref 98–111)
Creatinine, Ser: 0.6 mg/dL — ABNORMAL LOW (ref 0.61–1.24)
GFR, Estimated: >60 mL/min (ref >60)
Glucose, Bld: 167 mg/dL — ABNORMAL HIGH (ref 70–99)
Potassium: 4.3 mmol/L (ref 3.5–5.1)
Sodium: 133 mmol/L — ABNORMAL LOW (ref 135–145)
Total Bilirubin: 0.8 mg/dL (ref 0.3–1.2)
Total Protein: 7.3 g/dL (ref 6.5–8.1)

## 2021-09-14 LAB — GLUCOSE, CAPILLARY
Glucose-Capillary: 145 mg/dL — ABNORMAL HIGH (ref 70–99)
Glucose-Capillary: 202 mg/dL — ABNORMAL HIGH (ref 70–99)
Glucose-Capillary: 162 mg/dL — ABNORMAL HIGH (ref 70–99)
Glucose-Capillary: 205 mg/dL — ABNORMAL HIGH (ref 70–99)

## 2021-09-14 MED ORDER — PAROXETINE HCL 20 MG PO TABS
20.0000 mg | ORAL_TABLET | Freq: Every day | ORAL | Status: DC
  Administered 2021-09-14 – 2021-10-03 (×19): 20 mg via ORAL
  Filled 2021-09-14 (×19): qty 1

## 2021-09-14 MED ORDER — MIDODRINE HCL 5 MG PO TABS
5.0000 mg | ORAL_TABLET | Freq: Three times a day (TID) | ORAL | Status: DC
  Administered 2021-09-14 – 2021-09-21 (×21): 5 mg via ORAL
  Filled 2021-09-14 (×21): qty 1

## 2021-09-14 MED ORDER — OXYCODONE HCL 5 MG PO TABS
10.0000 mg | ORAL_TABLET | ORAL | Status: DC | PRN
  Administered 2021-09-14 – 2021-09-20 (×37): 15 mg via ORAL
  Administered 2021-09-21: 10 mg via ORAL
  Administered 2021-09-21 – 2021-09-22 (×6): 15 mg via ORAL
  Administered 2021-09-22 – 2021-09-24 (×10): 10 mg via ORAL
  Administered 2021-09-24: 15 mg via ORAL
  Administered 2021-09-24 – 2021-09-26 (×8): 10 mg via ORAL
  Administered 2021-09-26 – 2021-09-27 (×3): 15 mg via ORAL
  Administered 2021-09-27: 10 mg via ORAL
  Administered 2021-09-27 – 2021-09-28 (×6): 15 mg via ORAL
  Administered 2021-09-28: 10 mg via ORAL
  Administered 2021-09-29 (×4): 15 mg via ORAL
  Administered 2021-09-30: 10 mg via ORAL
  Administered 2021-09-30 (×3): 15 mg via ORAL
  Administered 2021-09-30 – 2021-10-01 (×3): 10 mg via ORAL
  Administered 2021-10-01 – 2021-10-02 (×3): 15 mg via ORAL
  Administered 2021-10-02 (×2): 10 mg via ORAL
  Administered 2021-10-02 – 2021-10-03 (×3): 15 mg via ORAL
  Filled 2021-09-14: qty 2
  Filled 2021-09-14 (×2): qty 3
  Filled 2021-09-14 (×2): qty 2
  Filled 2021-09-14 (×4): qty 3
  Filled 2021-09-14 (×2): qty 2
  Filled 2021-09-14 (×5): qty 3
  Filled 2021-09-14: qty 2
  Filled 2021-09-14 (×14): qty 3
  Filled 2021-09-14 (×2): qty 2
  Filled 2021-09-14 (×2): qty 3
  Filled 2021-09-14: qty 2
  Filled 2021-09-14 (×9): qty 3
  Filled 2021-09-14 (×2): qty 2
  Filled 2021-09-14 (×3): qty 3
  Filled 2021-09-14: qty 2
  Filled 2021-09-14: qty 3
  Filled 2021-09-14 (×2): qty 2
  Filled 2021-09-14 (×2): qty 3
  Filled 2021-09-14: qty 2
  Filled 2021-09-14 (×6): qty 3
  Filled 2021-09-14: qty 2
  Filled 2021-09-14 (×13): qty 3
  Filled 2021-09-14 (×2): qty 2
  Filled 2021-09-14: qty 3
  Filled 2021-09-14: qty 2
  Filled 2021-09-14 (×4): qty 3
  Filled 2021-09-14: qty 2
  Filled 2021-09-14 (×3): qty 3
  Filled 2021-09-14: qty 2
  Filled 2021-09-14 (×3): qty 3
  Filled 2021-09-14: qty 2
  Filled 2021-09-14: qty 3
  Filled 2021-09-14 (×2): qty 2
  Filled 2021-09-14: qty 3
  Filled 2021-09-14: qty 2

## 2021-09-14 NOTE — Progress Notes (Signed)
   09/14/21 1430  Clinical Encounter Type  Visited With Patient and family together  Visit Type Initial  Referral From Nurse  Consult/Referral To Chaplain   Chaplain responded to the consult request. Chaplain provided Advance Directive education to the patient and family member. Patient said he will inform his nurse once the document is filled out. The patient also asked me to inform his nurse, Jonelle Sidle to place a do not disturb sign on his door, he wants to rest. Chaplain inform NT unit secretary of the patient's request. This note was prepared by Jeanine Luz, M.Div..  For questions please contact by phone [phone].

## 2021-09-14 NOTE — Plan of Care (Signed)
  Problem: RH Balance Goal: LTG Patient will maintain dynamic standing with ADLs (OT) Description: LTG:  Patient will maintain dynamic standing balance with assist during activities of daily living (OT)  Flowsheets (Taken 09/14/2021 1247) LTG: Pt will maintain dynamic standing balance during ADLs with: Minimal Assistance - Patient > 75%   Problem: RH Grooming Goal: LTG Patient will perform grooming w/assist,cues/equip (OT) Description: LTG: Patient will perform grooming with assist, with/without cues using equipment (OT) Flowsheets (Taken 09/14/2021 1247) LTG: Pt will perform grooming with assistance level of: Minimal Assistance - Patient > 75%   Problem: RH Bathing Goal: LTG Patient will bathe all body parts with assist levels (OT) Description: LTG: Patient will bathe all body parts with assist levels (OT) Flowsheets (Taken 09/14/2021 1247) LTG: Pt will perform bathing with assistance level/cueing: Minimal Assistance - Patient > 75%   Problem: RH Dressing Goal: LTG Patient will perform upper body dressing (OT) Description: LTG Patient will perform upper body dressing with assist, with/without cues (OT). Flowsheets (Taken 09/14/2021 1247) LTG: Pt will perform upper body dressing with assistance level of: Minimal Assistance - Patient > 75% Goal: LTG Patient will perform lower body dressing w/assist (OT) Description: LTG: Patient will perform lower body dressing with assist, with/without cues in positioning using equipment (OT) Flowsheets (Taken 09/14/2021 1247) LTG: Pt will perform lower body dressing with assistance level of: Minimal Assistance - Patient > 75%   Problem: RH Toileting Goal: LTG Patient will perform toileting task (3/3 steps) with assistance level (OT) Description: LTG: Patient will perform toileting task (3/3 steps) with assistance level (OT)  Flowsheets (Taken 09/14/2021 1247) LTG: Pt will perform toileting task (3/3 steps) with assistance level: Minimal Assistance -  Patient > 75%   Problem: RH Toilet Transfers Goal: LTG Patient will perform toilet transfers w/assist (OT) Description: LTG: Patient will perform toilet transfers with assist, with/without cues using equipment (OT) Flowsheets (Taken 09/14/2021 1247) LTG: Pt will perform toilet transfers with assistance level of: Minimal Assistance - Patient > 75%   Problem: RH Tub/Shower Transfers Goal: LTG Patient will perform tub/shower transfers w/assist (OT) Description: LTG: Patient will perform tub/shower transfers with assist, with/without cues using equipment (OT) Flowsheets (Taken 09/14/2021 1247) LTG: Pt will perform tub/shower stall transfers with assistance level of: Minimal Assistance - Patient > 75%

## 2021-09-14 NOTE — Progress Notes (Signed)
[facility] Individual Statement of Services  Patient Name:  Carlos [name].  Date:  09/14/2021  Welcome to the [facility].  Our goal is to provide you with an individualized program based on your diagnosis and situation, designed to meet your specific needs.  With this comprehensive rehabilitation program, you will be expected to participate in at least 3 hours of rehabilitation therapies Monday-Friday, with modified therapy programming on the weekends.  Your rehabilitation program will include the following services:  Physical Therapy (PT), Occupational Therapy (OT), Speech Therapy (ST), 24 hour per day rehabilitation nursing, Therapeutic Recreaction (TR), Neuropsychology, Care Coordinator, Rehabilitation Medicine, Nutrition Services, Pharmacy Services, and Other  Weekly team conferences will be held on Tuesdays to discuss your progress.  Your Inpatient Rehabilitation Care Coordinator will talk with you frequently to get your input and to update you on team discussions.  Team conferences with you and your family in attendance may also be held.  Expected length of stay: 16-20 Days  Overall anticipated outcome: MOD I to Supervision  Depending on your progress and recovery, your program may change. Your Inpatient Rehabilitation Care Coordinator will coordinate services and will keep you informed of any changes. Your Inpatient Rehabilitation Care Coordinator's name and contact numbers are listed  below.  The following services may also be recommended but are not provided by the [facility]:   Rachel will be made to provide these services after discharge if needed.  Arrangements include referral to agencies that provide these services.  Your insurance has been verified to be:  BSBS COMM Your primary doctor is:  NO PCP  Pertinent information will be  shared with your doctor and your insurance company.  Inpatient Rehabilitation Care Coordinator:  Cathleen Corti [phone] or ([phone]  Information discussed with and copy given to patient by: Dyanne Iha, 09/14/2021, 12:46 PM

## 2021-09-14 NOTE — Progress Notes (Signed)
Occupational Therapy Session Note  Patient Details  Name: Carlos [name]. MRN: [id number] Date of Birth: [birth year]  Today's Date: 09/14/2021 OT Individual Time: 1345-1430 OT Individual Time Calculation (min): 45 min    Short Term Goals: Week 1:  OT Short Term Goal 1 (Week 1): Pt will complete sit<stand during LB self care with no more than Min A OT Short Term Goal 2 (Week 1): Pt will complete UB dressing with Mod A OT Short Term Goal 3 (Week 1): Pt will complete 1/3 components of toileting with no more than Min balance assistance  Skilled Therapeutic Interventions/Progress Updates:  Pt resting in bed upon arrival with father present. Pt using urinal. Discussed role of OT. 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 Pt with weak RUE grasp and extension. Pt issued two foam blocks secured with coban to increase resistance and size. Pt pleased. Pt's TV not working and Biomedical engineer notified. Pt remained in bed with all needs within reach. Bed alarm activated. Father present.   Therapy Documentation Precautions:  Precautions Precautions: Fall, Back, Cervical Required Braces or Orthoses: Cervical Brace Cervical Brace: Hard collar (c-collar to be donned in sitting, Lt UE sling used for comfort) Restrictions Weight Bearing Restrictions: Yes LUE Weight Bearing: Non weight bearing Pain: Pain Assessment Pain Scale: 0-10 Pain Score: 6  Pain Type: Acute pain;Surgical pain Pain Location: Back Pain Orientation: Mid;Upper Pain Radiating Towards: shoulders Pain Descriptors / Indicators: Constant Pain Frequency: Constant Patients Stated Pain Goal: 2 Pain Intervention(s): Medication (See eMAR);Pain med given for lower pain score than stated, per patient request Multiple Pain Sites: No  Therapy/Group: Individual Therapy  Leroy Libman 09/14/2021, 2:35 PM

## 2021-09-14 NOTE — Progress Notes (Signed)
Patient information reviewed and entered into eRehab System by Becky Shafter Jupin, PPS coordinator. Information including medical coding, function ability, and quality indicators will be reviewed and updated through discharge.   

## 2021-09-14 NOTE — Plan of Care (Signed)
  Problem: RH Balance Goal: LTG Patient will maintain dynamic sitting balance (PT) Description: LTG:  Patient will maintain dynamic sitting balance with assistance during mobility activities (PT) Flowsheets (Taken 09/14/2021 1755) LTG: Pt will maintain dynamic sitting balance during mobility activities with:: Independent with assistive device  Goal: LTG Patient will maintain dynamic standing balance (PT) Description: LTG:  Patient will maintain dynamic standing balance with assistance during mobility activities (PT) Flowsheets (Taken 09/14/2021 1755) LTG: Pt will maintain dynamic standing balance during mobility activities with:: Contact Guard/Touching assist   Problem: RH Bed to Chair Transfers Goal: LTG Patient will perform bed/chair transfers w/assist (PT) Description: LTG: Patient will perform bed to chair transfers with assistance (PT). Flowsheets (Taken 09/14/2021 1755) LTG: Pt will perform Bed to Chair Transfers with assistance level: Contact Guard/Touching assist   Problem: RH Car Transfers Goal: LTG Patient will perform car transfers with assist (PT) Description: LTG: Patient will perform car transfers with assistance (PT). Flowsheets (Taken 09/14/2021 1755) LTG: Pt will perform car transfers with assist:: Minimal Assistance - Patient > 75%   Problem: RH Furniture Transfers Goal: LTG Patient will perform furniture transfers w/assist (OT/PT) Description: LTG: Patient will perform furniture transfers  with assistance (OT/PT). Flowsheets (Taken 09/14/2021 1755) LTG: Pt will perform furniture transfers with assist:: Contact Guard/Touching assist   Problem: RH Ambulation Goal: LTG Patient will ambulate in controlled environment (PT) Description: LTG: Patient will ambulate in a controlled environment, # of feet with assistance (PT). Flowsheets (Taken 09/14/2021 1755) LTG: Pt will ambulate in controlled environ  assist needed:: Minimal Assistance - Patient > 75% LTG: Ambulation distance  in controlled environment: 123f with LRAD Goal: LTG Patient will ambulate in home environment (PT) Description: LTG: Patient will ambulate in home environment, # of feet with assistance (PT). Flowsheets (Taken 09/14/2021 1755) LTG: Pt will ambulate in home environ  assist needed:: Minimal Assistance - Patient > 75% LTG: Ambulation distance in home environment: 553fwith LRAD   Problem: RH Wheelchair Mobility Goal: LTG Patient will propel w/c in controlled environment (PT) Description: LTG: Patient will propel wheelchair in controlled environment, # of feet with assist (PT) Flowsheets (Taken 09/14/2021 1755) LTG: Pt will propel w/c in controlled environ  assist needed:: Supervision/Verbal cueing LTG: Propel w/c distance in controlled environment: 10053foal: LTG Patient will propel w/c in home environment (PT) Description: LTG: Patient will propel wheelchair in home environment, # of feet with assistance (PT). Flowsheets (Taken 09/14/2021 1755) LTG: Pt will propel w/c in home environ  assist needed:: Supervision/Verbal cueing Distance: wheelchair distance in controlled environment: 50

## 2021-09-14 NOTE — Progress Notes (Signed)
Bilateral lower extremity venous duplex has been completed. Preliminary results can be found in CV Proc through chart review.   09/14/21 1:42 PM Carlos [name] RVT
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
## 2021-09-14 NOTE — Plan of Care (Signed)
  Problem: Consults Goal: RH SPINAL CORD INJURY PATIENT EDUCATION Description:  See Patient Education module for education specifics.  Outcome: Progressing Goal: Skin Care Protocol Initiated - if Braden Score 18 or less Description: If consults are not indicated, leave blank or document N/A Outcome: Progressing Goal: Diabetes Guidelines if Diabetic/Glucose > 140 Description: If diabetic or lab glucose is > 140 mg/dl - Initiate Diabetes/Hyperglycemia Guidelines & Document Interventions  Outcome: Progressing   Problem: RH SKIN INTEGRITY Goal: RH STG MAINTAIN SKIN INTEGRITY WITH ASSISTANCE Description: STG Maintain Skin Integrity With Supervision Assistance. Outcome: Progressing Goal: RH STG ABLE TO PERFORM INCISION/WOUND CARE W/ASSISTANCE Description: STG Able To Perform Incision/Wound Care With Supervision Assistance. Outcome: Progressing   Problem: RH SAFETY Goal: RH STG ADHERE TO SAFETY PRECAUTIONS W/ASSISTANCE/DEVICE Description: STG Adhere to Safety Precautions With Supervision Assistance/Device. Outcome: Progressing Goal: RH STG DECREASED RISK OF FALL WITH ASSISTANCE Description: STG Decreased Risk of Fall With Cues and Reminders. Outcome: Progressing   Problem: RH PAIN MANAGEMENT Goal: RH STG PAIN MANAGED AT OR BELOW PT'S PAIN GOAL Description: < 3 on a 0-10 pain scale. Outcome: Progressing   Problem: RH KNOWLEDGE DEFICIT SCI Goal: RH STG INCREASE KNOWLEDGE OF SELF CARE AFTER SCI Description:  Patient will demonstrate knowledge of medication, and pain management, skin/wound care, weight bearing precautions, and don/doff brace with educational materials and handouts provided by staff independently at discharge. Outcome: Progressing

## 2021-09-15 LAB — GLUCOSE, CAPILLARY
Glucose-Capillary: 232 mg/dL — ABNORMAL HIGH (ref 70–99)
Glucose-Capillary: 157 mg/dL — ABNORMAL HIGH (ref 70–99)
Glucose-Capillary: 126 mg/dL — ABNORMAL HIGH (ref 70–99)
Glucose-Capillary: 188 mg/dL — ABNORMAL HIGH (ref 70–99)
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
## 2021-09-16 LAB — GLUCOSE, CAPILLARY
Glucose-Capillary: 234 mg/dL — ABNORMAL HIGH (ref 70–99)
Glucose-Capillary: 153 mg/dL — ABNORMAL HIGH (ref 70–99)
Glucose-Capillary: 208 mg/dL — ABNORMAL HIGH (ref 70–99)
Glucose-Capillary: 182 mg/dL — ABNORMAL HIGH (ref 70–99)

## 2021-09-16 MED ORDER — SORBITOL 70 % SOLN
30.0000 mL | Freq: Once | Status: AC
  Administered 2021-09-16: 30 mL via ORAL
  Filled 2021-09-16: qty 30

## 2021-09-16 MED ORDER — FLUDROCORTISONE ACETATE 0.1 MG PO TABS
0.1000 mg | ORAL_TABLET | Freq: Every day | ORAL | Status: DC
  Administered 2021-09-16 – 2021-09-27 (×12): 0.1 mg via ORAL
  Filled 2021-09-16 (×12): qty 1

## 2021-09-16 NOTE — Progress Notes (Signed)
Physical Therapy Session Note  Patient Details  Name: Carlos [name]. MRN: [id number] Date of Birth: 1971/09/11  Today's Date: 09/16/2021 PT Individual Time: 3570-1779 and 1501-1530 PT Individual Time Calculation (min): 58 min and 29 min.  Short Term Goals: Week 1:     Skilled Therapeutic Interventions/Progress Updates:  First session:  Pt presents semi-reclined in bed and agreeable to therapy.  Pt performed log roll to right w/ supervision and CGA for sidelying to sit using RUE, verbal cues for attempts to use LUE.  Pt sat EOB w/o UE support and total A to don c-collar.  PT donned sling for LUE and educated on correct placement.  Pt performed sit to stand w/ mod A and then SPT bed > w/c w/ mod A.  Wheeled to Dyroom for time and energy conservation.  Pt performed sit to stand transfers at Hi-lo table w/ mod A.  Pt performed Connect-4 at tabletop, reaching to R and up x 2-4'.  Pt cued for unsupported stand to improve LE strength and coordination, will lean hips on table.  Pt states feeling "lightheaded" and cold washcloth and water given.  BP checked in sitting at 118/79, HR 85.  Pt returned to room.  Pt transferred sit to stand w/ mod A and SPT w/c > bed w/ mod using bed rail.  PT doffed sling and C-collar and pt transferred reverse log roll w/ CGA and use of siderail.  Pt required min A to scoot to HOB using BLEs.  Pt then returned to sitting to remove button down shirt w/ min A.  Pt remained in bed w/ bed alarm on and all needs in reach.  Spouse present during treatment session.  Second session:  Pt presents supine in bed and agreeable to therapy.  Pt transferred sup to sit via log roll technique and CGA.  Occ verbal cues for maintaining NWB L UE, but pt aware.  Donned C-collar and LUE sling w/ total A.  Educated pt and spouse on correct procedure.  Pt states need for use of BR.  Encouraged to amb to BR.  Pt transfers sit to stand from bed w/ Mod A and then max A for amb into BR w/ HHA, education  to push down into PT hand for assist w/ LE WB.  Pt performing reciprocal gait but occasional decreased BOS.  Pt able to improve BOS w/ cueing.  Pt requires mod A for stand to sit and sit to stand from toilet using hand rail.  Pt requires mod A for steadying for clothing management.  Pt amb to sink and washed hand.  Pt amb to bed and then states LB more sore.  Pt amb to w/c for back support w/ mod A.  Pt amb out into hallway and performed u-turn to return to bed.  Pt improved BOS but cueing for flexed knees.  Pt required Total A for doff sling and C-collar.  Pt transferred reverse log roll technique sit to supine.  Pt able to bridge to adjust pants.  Bed alarm on and all needs in reach.     Therapy Documentation Precautions:  Precautions Precautions: Fall, Back, Cervical Required Braces or Orthoses: Cervical Brace Cervical Brace: Hard collar (c-collar to be donned in sitting, Lt UE sling used for comfort) Restrictions Weight Bearing Restrictions: Yes LUE Weight Bearing: Non weight bearing General:   Vital Signs:   Pain: Pain Assessment Pain Scale: 0-10 Pain Score: 8  Pain Type: Surgical pain Pain Location: Back Pain Intervention(s): Medication (  See eMAR) Mobility:   Locomotion :    Trunk/Postural Assessment :    Balance:   Exercises:   Other Treatments:      Therapy/Group: Individual Therapy  Ladoris Gene 09/16/2021, 11:19 AM
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
## 2021-09-16 NOTE — Progress Notes (Signed)
Occupational Therapy Session Note  Patient Details  Name: Dejan Angert. MRN: [id number] Date of Birth: 1971-03-11  Today's Date: 09/16/2021 OT Individual Time: 0700-0757 OT Individual Time Calculation (min): 57 min    Short Term Goals: Week 1:  OT Short Term Goal 1 (Week 1): Pt will complete sit<stand during LB self care with no more than Min A OT Short Term Goal 2 (Week 1): Pt will complete UB dressing with Mod A OT Short Term Goal 3 (Week 1): Pt will complete 1/3 components of toileting with no more than Min balance assistance  Skilled Therapeutic Interventions/Progress Updates:    Pt resting in bed upon arrival. OT intervention with focus on LB dressing at bed level and UB dressing seated EOB, bed mobility, and ongoing education. Ted hose and ace wraps donned by OTA. Pt able to don pants at bed level without assistance. Pt required assistance donning socks. Min A for donning button up shirt while seated EOB. Pt able to snap fasteners. Pt c/o Lt shoulder pain and sling donned. BP monitored during session. Supine-125/86 and seated EOB (teds and ace wraps)-117/82. No s/s. Supine>sit EOB with HOB elevated with supervision. Sit>supine with supervision. Assistance to reposition. Pt remained in bed with all needs within reach. Bed alarm activated.   Therapy Documentation Precautions:  Precautions Precautions: Fall, Back, Cervical Required Braces or Orthoses: Cervical Brace Cervical Brace: Hard collar (c-collar to be donned in sitting, Lt UE sling used for comfort) Restrictions Weight Bearing Restrictions: Yes LUE Weight Bearing: Non weight bearing   Pain: Pt c/o 8/10 Lt shoulder pain with activity and when sitting up; requested pain meds from RN and repositioned  Therapy/Group: Individual Therapy  Leroy Libman 09/16/2021, 8:03 AM

## 2021-09-16 NOTE — Plan of Care (Signed)
  Problem: Consults Goal: RH SPINAL CORD INJURY PATIENT EDUCATION Description:  See Patient Education module for education specifics.  Outcome: Progressing Goal: Skin Care Protocol Initiated - if Braden Score 18 or less Description: If consults are not indicated, leave blank or document N/A Outcome: Progressing Goal: Diabetes Guidelines if Diabetic/Glucose > 140 Description: If diabetic or lab glucose is > 140 mg/dl - Initiate Diabetes/Hyperglycemia Guidelines & Document Interventions  Outcome: Progressing   Problem: RH SKIN INTEGRITY Goal: RH STG MAINTAIN SKIN INTEGRITY WITH ASSISTANCE Description: STG Maintain Skin Integrity With Supervision Assistance. Outcome: Progressing Goal: RH STG ABLE TO PERFORM INCISION/WOUND CARE W/ASSISTANCE Description: STG Able To Perform Incision/Wound Care With Supervision Assistance. Outcome: Progressing   Problem: RH SAFETY Goal: RH STG ADHERE TO SAFETY PRECAUTIONS W/ASSISTANCE/DEVICE Description: STG Adhere to Safety Precautions With Supervision Assistance/Device. Outcome: Progressing Goal: RH STG DECREASED RISK OF FALL WITH ASSISTANCE Description: STG Decreased Risk of Fall With Cues and Reminders. Outcome: Progressing   Problem: RH PAIN MANAGEMENT Goal: RH STG PAIN MANAGED AT OR BELOW PT'S PAIN GOAL Description: < 3 on a 0-10 pain scale. Outcome: Progressing   Problem: RH KNOWLEDGE DEFICIT SCI Goal: RH STG INCREASE KNOWLEDGE OF SELF CARE AFTER SCI Description:  Patient will demonstrate knowledge of medication, and pain management, skin/wound care, weight bearing precautions, and don/doff brace with educational materials and handouts provided by staff independently at discharge. Outcome: Progressing

## 2021-09-16 NOTE — Progress Notes (Signed)
Occupational Therapy Session Note  Patient Details  Name: Carlos [name]. MRN: [id number] Date of Birth: July 07, 1971  Today's Date: 09/16/2021 OT Individual Time: 0093-8182 OT Individual Time Calculation (min): 30 min    Short Term Goals: Week 1:  OT Short Term Goal 1 (Week 1): Pt will complete sit<stand during LB self care with no more than Min A OT Short Term Goal 2 (Week 1): Pt will complete UB dressing with Mod A OT Short Term Goal 3 (Week 1): Pt will complete 1/3 components of toileting with no more than Min balance assistance  Skilled Therapeutic Interventions/Progress Updates:    Pt resting in bed upon arrival with wife present. OT intervention with focus on BUE/hand strengthening and function. Pt issued theraputty (tan and red). Activities/tasks demonstrated for strengtheing and improved finger extension (4th and 5th digit). Pt unable to complete with red theraputty but return demonstrated with tan putty. Educated on importance of watching his hand when completing tasks and making all movements with intention. Pt's hands fatigued quickly and educated pt on importance of resting hands during activities. Pt remained in bed with all needs within reach and bed alarm activated. Wife present at bedside.   Therapy Documentation Precautions:  Precautions Precautions: Fall, Back, Cervical Required Braces or Orthoses: Cervical Brace Cervical Brace: Hard collar (c-collar to be donned in sitting, Lt UE sling used for comfort) Restrictions Weight Bearing Restrictions: Yes LUE Weight Bearing: Non weight bearing   Pain: Pain Assessment Pain Scale: 0-10 Pain Score: 8  Pain Type: Surgical pain Pain Location: Back Pain Intervention(s): premedicated and repositioned    Therapy/Group: Individual Therapy  Leroy Libman 09/16/2021, 12:15 PM

## 2021-09-17 LAB — GLUCOSE, CAPILLARY
Glucose-Capillary: 142 mg/dL — ABNORMAL HIGH (ref 70–99)
Glucose-Capillary: 206 mg/dL — ABNORMAL HIGH (ref 70–99)
Glucose-Capillary: 171 mg/dL — ABNORMAL HIGH (ref 70–99)
Glucose-Capillary: 179 mg/dL — ABNORMAL HIGH (ref 70–99)

## 2021-09-17 NOTE — Progress Notes (Signed)
Inpatient Diabetes Program Recommendations  AACE/ADA: New Consensus Statement on Inpatient Glycemic Control (2015)  Target Ranges:  Prepandial:   less than 140 mg/dL      Peak postprandial:   less than 180 mg/dL (1-2 hours)      Critically ill patients:  140 - 180 mg/dL   Lab Results  Component Value Date   GLUCAP 206 (H) 09/17/2021   HGBA1C 8.4 (H) 08/30/2021    Review of Glycemic Control Results for Carlos [name], Carlos [name] (MRN [id number]) as of 09/17/2021 11:32  Ref. Range 09/16/2021 16:05 09/16/2021 21:27 09/17/2021 06:03 09/17/2021 11:29  Glucose-Capillary Latest Ref Range: 70 - 99 mg/dL 208 (H) 182 (H) 142 (H) 206 (H)   Diabetes history: Type 2 DM Outpatient Diabetes medications: Metformin 1000 mg BID, Jardiance 10 mg QD Current orders for Inpatient glycemic control: Novolog 0-15 units TID Florinef 0.1 mg QD  Inpatient Diabetes Program Recommendations:    In the setting of steroids, post prandial values slightly elevated above target goals.  Could consider: -Adding Glipizide 5 mg BID -Changing/reassessing nutritional supplement Ensure Enlive (38 g CHO)  Thanks, Bronson Curb, MSN, RNC-OB Diabetes Coordinator [phone] (8a-5p)

## 2021-09-17 NOTE — Progress Notes (Addendum)
Occupational Therapy Session Note  Patient Details  Name: Carlos [name]. MRN: [id number] Date of Birth: [birth year]  Today's Date: 09/17/2021 OT Individual Time: 0051-1021 OT Individual Time Calculation (min): 70 min    Short Term Goals: Week 1:  OT Short Term Goal 1 (Week 1): Pt will complete sit<stand during LB self care with no more than Min A OT Short Term Goal 2 (Week 1): Pt will complete UB dressing with Mod A OT Short Term Goal 3 (Week 1): Pt will complete 1/3 components of toileting with no more than Min balance assistance  Skilled Therapeutic Interventions/Progress Updates:    Pt resting in bed upon arrival and ready for therapy. Initial focus on bed mobility, sitting balance, UB dressing, and BSC transfers. TEd hose and ace wraps applied for BLE before sitting EOB. Supine>sit EOB with supervision with HOB slightly elevated. PT required min verbal cues to avoid using LUE during transition. Pt donned pull over shirt with min A. Dependent for donning sling and cervical collar. Pt requested use of BSC and completed stand pivot transfer bed<>BSC with min HHA. Pt returned to EOB. Pt stated his back was starting to hurt and returned to supine with HOB elevated to 70*. Pt engaged in theraputty activities with focus on grasp and finger strengthening in addition to AROM (extension of digits 4 and 5). Pt requested to was hair and shampoo cap provided. Pt required min A to complete task 2/2 increase back pain with using LUE to wash hair. Pt remained in bed with all needs within reach. Bed alarm activated.   BP supine-109/79 BP seated with Ted hose and ace wraps-107/65  Therapy Documentation Precautions:  Precautions Precautions: Fall, Back, Cervical Required Braces or Orthoses: Cervical Brace Cervical Brace: Hard collar (c-collar to be donned in sitting, Lt UE sling used for comfort) Restrictions Weight Bearing Restrictions: Yes LUE Weight Bearing: Non weight bearing   Pain: Pain  Assessment Pain Scale: 0-10 Pain Score: 8  Pain Type: Surgical pain Pain Location: Back Pain Intervention(s): repositioned and donned sling while sitting EOB and on BSC    Therapy/Group: Individual Therapy  Leroy Libman 09/17/2021, 9:26 AM
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
## 2021-09-17 NOTE — Progress Notes (Signed)
Patient ID: Carlos Frieze., male   DOB: [birth year], 50 y.o.   MRN: [id number]  SW made efforts to meet with pt introduce self, but pt was resting at the time. SW will continue to make efforts.   SW made efforts to make contact with tp wife Carlos [name] [phone]) but vm not set up. Efforts will continue to be made to discuss discharge plan.   Loralee Pacas, MSW, [place] Office: [phone] Cell: [phone] Fax: [phone]
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
## 2021-09-17 NOTE — Progress Notes (Signed)
Physical Therapy Session Note  Patient Details  Name: Carlos [name]. MRN: [id number] Date of Birth: 1971-05-02  Today's Date: 09/17/2021 PT Individual Time: 1620-1700 PT Individual Time Calculation (min): 40 min   Short Term Goals: Week 1:  PT Short Term Goal 1 (Week 1): Pt will ambulate x 50 ft with LRAD and min A consistently PT Short Term Goal 2 (Week 1): Pt will initiate stair training PT Short Term Goal 3 (Week 1): Pt will complete objective balance assessment to determine fall risk  Skilled Therapeutic Interventions/Progress Updates:    Pt received supine in bed asleep, arousable and agreeable to PT session. Pt reports ongoing nausea this PM but motivated to participate in session as able. Supine BP 110/68 with THT and BLE ACE wraps. Supine to sit with CGA with HOB elevated, cues to maintain LUE NWB precautions. Seated BP 100/69 with mild dizziness that resolves while seated. Sit to stand with min A and no AD throughout session. Session focus on sit to stand, stand pivot transfers bed to/from chair, and short distance forwards/backwards gait with min to mod A needed for standing balance due to ataxia. Pt has increase in nausea with standing, requests to lay back down. Sit to supine CGA. Pt left semi-reclined in bed with needs in reach, bed alarm in place. Pt missed 20 min of scheduled therapy session due to ongoing nausea this PM.  Therapy Documentation Precautions:  Precautions Precautions: Fall, Back, Cervical Required Braces or Orthoses: Cervical Brace Cervical Brace: Hard collar (c-collar to be donned in sitting, Lt UE sling used for comfort) Restrictions Weight Bearing Restrictions: Yes LUE Weight Bearing: Non weight bearing General: PT Amount of Missed Time (min): 20 Minutes PT Missed Treatment Reason: Patient ill (Comment) (nausea)    Therapy/Group: Individual Therapy   Excell Seltzer, PT, DPT, CSRS  09/17/2021, 5:53 PM

## 2021-09-17 NOTE — Progress Notes (Signed)
Occupational Therapy Note  Patient Details  Name: Carlos [name]. MRN: [id number] Date of Birth: 12/26/1970  Today's Date: 09/17/2021 OT Missed Time: 44 Minutes Missed Time Reason: Other (comment) (n/v)  Pt in bed upon arrival. Pt c/o nausea and HA and unable to participate. Will check back as able. Pt missed 60 mins skilled OT services.   Leotis Shames [facility] 09/17/2021, 1:58 PM

## 2021-09-18 LAB — GLUCOSE, CAPILLARY
Glucose-Capillary: 139 mg/dL — ABNORMAL HIGH (ref 70–99)
Glucose-Capillary: 156 mg/dL — ABNORMAL HIGH (ref 70–99)
Glucose-Capillary: 151 mg/dL — ABNORMAL HIGH (ref 70–99)
Glucose-Capillary: 139 mg/dL — ABNORMAL HIGH (ref 70–99)

## 2021-09-18 MED ORDER — DICLOFENAC SODIUM 25 MG PO TBEC
50.0000 mg | DELAYED_RELEASE_TABLET | Freq: Three times a day (TID) | ORAL | Status: DC
  Administered 2021-09-18 – 2021-09-25 (×23): 50 mg via ORAL
  Filled 2021-09-18 (×5): qty 2
  Filled 2021-09-18: qty 1
  Filled 2021-09-18 (×22): qty 2
  Filled 2021-09-18: qty 1

## 2021-09-18 MED ORDER — GLUCERNA SHAKE PO LIQD
237.0000 mL | Freq: Three times a day (TID) | ORAL | Status: DC
Start: 2021-09-18 — End: 2021-10-04
  Administered 2021-09-18 – 2021-10-03 (×35): 237 mL via ORAL

## 2021-09-18 NOTE — Progress Notes (Signed)
Physical Therapy Session Note  Patient Details  Name: Carlos [name]. MRN: [id number] Date of Birth: [birth year]  Today's Date: 09/18/2021 PT Individual Time: 0800-0900; 1640-1705 PT Individual Time Calculation (min): 60 min and 25 min  Short Term Goals: Week 1:  PT Short Term Goal 1 (Week 1): Pt will ambulate x 50 ft with LRAD and min A consistently PT Short Term Goal 2 (Week 1): Pt will initiate stair training PT Short Term Goal 3 (Week 1): Pt will complete objective balance assessment to determine fall risk  Skilled Therapeutic Interventions/Progress Updates:    Session 1: Pt received seated in bed, agreeable to PT session. Pt reports 6/10 pain in his back, premedicated prior to start of therapy session. Pt reports ongoing nausea but does not limit participation this AM. Supine BP 137/90. Assisted pt with donning thigh high TEDs and BLE ACE wrap. Supine to sit with CGA with HOB elevated, cues for LUE NWB. Seated BP 136/85. Assisted pt with donning shirt and LUE sling while seated EOB. Sit to stand with min A, stand pivot transfer to w/c with min A with RUE support on therapist shoulders. Pt is setup A for oral hygiene while seated in w/c at sink. Toilet transfer with min A and use of grab bar, pt is min A for standing balance during clothing management. Pt unable to void while seated on toilet. Introduced hemi-walker this session for improved balance during standing and gait. Ambulation x 75 ft with HW and min A for balance, occasional LOB that pt able to correct with min A. Pt exhibits decreased hip and knee flexion during gait, cues for heel strike. Pt also fatigues very quickly during gait and requires one standing rest break. Pt requests to return to bed at end of session due to back pain. Sit to supine CGA with HOB elevated. Pt left semi-reclined in bed with needs in reach, bed alarm in place at end of session.  Session 2: Pt received seated on BSC, requesting time to finish toileting.  This therapist returns at 1640 and pt back in bed and done toileting. Pt agreeable to participate in therapy session. Supine to sit with CGA. Seated BP 114/77 with no TEDs and no ACE wrap. Sit to stand with min A to [place]. Standing BP 101/63 and pt remains asymptomatic. Standing squats with HW and min A for balance, x 10 reps, x 4 reps. Pt has onset of mid-upper back pain during squats. Pt then reports feeling lightheaded and requests to lay back down. Sit to supine CGA. Supine BP 109/77 and symptoms improve in supine position. Assisted pt with placing short-acting hot pack on mid-upper back for pain management, instructed pt to not leave on for longer than 15 min. Pt left seated in bed with needs in reach, bed alarm in place.  Therapy Documentation Precautions:  Precautions Precautions: Fall, Back, Cervical Required Braces or Orthoses: Cervical Brace Cervical Brace: Hard collar (c-collar to be donned in sitting, Lt UE sling used for comfort) Restrictions Weight Bearing Restrictions: Yes LUE Weight Bearing: Non weight bearing     Therapy/Group: Individual Therapy   Excell Seltzer, PT, DPT, CSRS  09/18/2021, 10:51 AM

## 2021-09-18 NOTE — Progress Notes (Signed)
Occupational Therapy Session Note  Patient Details  Name: Carlos [name]. MRN: [id number] Date of Birth: 1971/09/07  Today's Date: 09/18/2021 OT Individual Time: 8527-7824 OT Individual Time Calculation (min): 57 min    Short Term Goals: Week 1:  OT Short Term Goal 1 (Week 1): Pt will complete sit<stand during LB self care with no more than Min A OT Short Term Goal 2 (Week 1): Pt will complete UB dressing with Mod A OT Short Term Goal 3 (Week 1): Pt will complete 1/3 components of toileting with no more than Min balance assistance   Skilled Therapeutic Interventions/Progress Updates:    Pt greeted at time of session semireclined in bed resting with father present who remained throughout session, informal family ed. Pt able to direct care throughout session for LUE sling, use and positioning of hemiwalker, and good adherence to WB precautions. BP checked in supine at 114/73, Supine > sit Supervision and BP reading 99/64 sitting EOB. Stand pivot bed > wheelchair Min A with hemiwalker and BP 115/75 up in chair. Note ataxic BLEs in standing during stand pivot transfer. Pt transported room > ADL apartment > gym > back to room dependently. Focus of session on OOB tolerance, unsupported sitting, orthostatic education, and techniques to improve BP. Stand pivot wheelchair <> bed with Min A and hemiwalker. Grip strength measured as well as follows: R hand: 28# and L hand: 34# per pt request to test grip strength. Back in room, set up for lunch with built up handle using RUE to eat. RN aware pt wanting to sit up for lunch. Alarm on call bell in reach.    Therapy Documentation Precautions:  Precautions Precautions: Fall, Back, Cervical Required Braces or Orthoses: Cervical Brace Cervical Brace: Hard collar (c-collar to be donned in sitting, Lt UE sling used for comfort) Restrictions Weight Bearing Restrictions: Yes LUE Weight Bearing: Non weight bearing     Therapy/Group: Individual  Therapy  Viona Gilmore 09/18/2021, 12:58 PM
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
## 2021-09-18 NOTE — Progress Notes (Signed)
Occupational Therapy Session Note  Patient Details  Name: Carlos [name]. MRN: [id number] Date of Birth: 1971/06/24  Today's Date: 09/18/2021 OT Individual Time: 7026-3785 OT Individual Time Calculation (min): 34 min  Missed 26 mins d/t s/s of [place]   Short Term Goals: Week 1:  OT Short Term Goal 1 (Week 1): Pt will complete sit<stand during LB self care with no more than Min A OT Short Term Goal 2 (Week 1): Pt will complete UB dressing with Mod A OT Short Term Goal 3 (Week 1): Pt will complete 1/3 components of toileting with no more than Min balance assistance  Skilled Therapeutic Interventions/Progress Updates:  Pt greeted supine in bed with thigh high TEDs and ace wraps donned agreeable to OT intervention. Pt completed bed mobility throughout session with supervision with pt exiting to pts R side. Attempted OOB mobility x2 with s/s of [place] Supine: 110/80 Seated EOB: 98/63 pt initially feels okay then reports dizziness needing to return to supine. Pt returned to supine for ~ 10 mins; BP from supine- 113/81 Attempted sitting again, EOB- 98/73, attempted sitting BLE therex to increased blood flow however again reports dizziness needing to return to supine- 115/79.  Pt politely declined further mobility. Encouraged pt to elevate HOB while in bed. Pt left supine in bed with bed alarm activated and all needs within reach- RN aware.   Therapy Documentation Precautions:  Precautions Precautions: Fall, Back, Cervical Required Braces or Orthoses: Cervical Brace Cervical Brace: Hard collar (c-collar to be donned in sitting, Lt UE sling used for comfort) Restrictions Weight Bearing Restrictions: Yes LUE Weight Bearing: Non weight bearing  Pain: pt reports no pain during session     Therapy/Group: Individual Therapy  Precious Haws 09/18/2021, 12:10 PM

## 2021-09-18 NOTE — Progress Notes (Signed)
Patient ID: Carlos Frieze., male   DOB: July 22, 1971, 50 y.o.   MRN: [id number]  Team Conference Report to Patient/Family  Team Conference discussion was reviewed with the patient and caregiver, including goals, any changes in plan of care and target discharge date.  Patient and caregiver express understanding and are in agreement.  The patient has a target discharge date of 10/04/21.  SW met with pt and called spouse provided conference updates. Spouse reports she will pass along updates to pt mother. Spouse requesting updates on pt BM. No additional questions or concerns.  Carlos [name] 09/18/2021, 11:46 AM
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
## 2021-09-19 LAB — CBC WITH DIFFERENTIAL/PLATELET
Abs Immature Granulocytes: 0.08 10*3/uL — ABNORMAL HIGH (ref 0.00–0.07)
Basophils Absolute: 0.1 10*3/uL (ref 0.0–0.1)
Basophils Relative: 1 %
Eosinophils Absolute: 0.1 10*3/uL (ref 0.0–0.5)
Eosinophils Relative: 2 %
HCT: 32.6 % — ABNORMAL LOW (ref 39.0–52.0)
Hemoglobin: 10.7 g/dL — ABNORMAL LOW (ref 13.0–17.0)
Immature Granulocytes: 1 %
Lymphocytes Relative: 17 %
Lymphs Abs: 1.6 10*3/uL (ref 0.7–4.0)
MCH: 31.2 pg (ref 26.0–34.0)
MCHC: 32.8 g/dL (ref 30.0–36.0)
MCV: 95 fL (ref 80.0–100.0)
Monocytes Absolute: 0.5 10*3/uL (ref 0.1–1.0)
Monocytes Relative: 6 %
Neutro Abs: 7 10*3/uL (ref 1.7–7.7)
Neutrophils Relative %: 73 %
Platelets: 498 10*3/uL — ABNORMAL HIGH (ref 150–400)
RBC: 3.43 MIL/uL — ABNORMAL LOW (ref 4.22–5.81)
RDW: 12.7 % (ref 11.5–15.5)
WBC: 9.3 10*3/uL (ref 4.0–10.5)
nRBC: 0 % (ref 0.0–0.2)

## 2021-09-19 LAB — BASIC METABOLIC PANEL
Anion gap: 8 (ref 5–15)
BUN: 26 mg/dL — ABNORMAL HIGH (ref 6–20)
CO2: 32 mmol/L (ref 22–32)
Calcium: 9.1 mg/dL (ref 8.9–10.3)
Chloride: 92 mmol/L — ABNORMAL LOW (ref 98–111)
Creatinine, Ser: 0.68 mg/dL (ref 0.61–1.24)
GFR, Estimated: >60 mL/min (ref >60)
Glucose, Bld: 147 mg/dL — ABNORMAL HIGH (ref 70–99)
Potassium: 4.2 mmol/L (ref 3.5–5.1)
Sodium: 132 mmol/L — ABNORMAL LOW (ref 135–145)

## 2021-09-19 LAB — GLUCOSE, CAPILLARY
Glucose-Capillary: 142 mg/dL — ABNORMAL HIGH (ref 70–99)
Glucose-Capillary: 154 mg/dL — ABNORMAL HIGH (ref 70–99)
Glucose-Capillary: 130 mg/dL — ABNORMAL HIGH (ref 70–99)
Glucose-Capillary: 160 mg/dL — ABNORMAL HIGH (ref 70–99)

## 2021-09-19 NOTE — Progress Notes (Signed)
Occupational Therapy Session Note  Patient Details  Name: Carlos [name]. MRN: [id number] Date of Birth: [birth year]  Today's Date: 09/19/2021 OT Individual Time: 3817-7116 OT Individual Time Calculation (min): 75 min    Short Term Goals: Week 1:  OT Short Term Goal 1 (Week 1): Pt will complete sit<stand during LB self care with no more than Min A OT Short Term Goal 2 (Week 1): Pt will complete UB dressing with Mod A OT Short Term Goal 3 (Week 1): Pt will complete 1/3 components of toileting with no more than Min balance assistance  Skilled Therapeutic Interventions/Progress Updates:    Pt resting in bed upon arrival. OT intervention with focus on stand pivot transfers, grooming at sink, bathing at shower level, dressing with sit<>stand, activity tolerance, and safety awareness. Bed mobility with supervision. Transfers with min A HHA. Pt required min A for shaving at sink. Bathing with min A at shower level. LB dressing with assistance for donning socks only. Pt required mod verbal cues for LUE WB precautions throughout session. Standing balance for bathing and LB dressing with CGA. Pt with no reports of dizziness or nausea throughout session. Pt remained in bed with all needs within reach and bed alarm activated.   Therapy Documentation Precautions:  Precautions Precautions: Fall, Back, Cervical Required Braces or Orthoses: Cervical Brace Cervical Brace: Hard collar (c-collar to be donned in sitting, Lt UE sling used for comfort) Restrictions Weight Bearing Restrictions: Yes LUE Weight Bearing: Non weight bearing   Pain: Pain Assessment Pain Scale: 0-10 Pain Score: 5  Pain Type: Surgical pain Pain Location: Back Pain Orientation: Mid Pain Descriptors / Indicators: Aching Pain Frequency: Constant Pain Onset: On-going Pain Intervention(s): shower and repositioned   Therapy/Group: Individual Therapy  Leroy Libman 09/19/2021, 9:40 AM

## 2021-09-19 NOTE — Progress Notes (Signed)
Physical Therapy Session Note  Patient Details  Name: Carlos [name]. MRN: [id number] Date of Birth: [birth year]  Today's Date: 09/19/2021 PT Individual Time: 1130-1150; 3662-9476 PT Individual Time Calculation (min): 20 min and 30 min  Short Term Goals: Week 1:  PT Short Term Goal 1 (Week 1): Pt will ambulate x 50 ft with LRAD and min A consistently PT Short Term Goal 2 (Week 1): Pt will initiate stair training PT Short Term Goal 3 (Week 1): Pt will complete objective balance assessment to determine fall risk  Skilled Therapeutic Interventions/Progress Updates:    Session 1: Pt received supine in bed for makeup therapy session. Pt reports feeling "woozy, supine BP 112/76. Pt also requesting Zofran for nausea, nursing notified and able to administer during session. Pt reports ongoing pain in upper back that shoots into his LE with trunk flexed. Education regarding nerve stretch in flexed position. Pt declines any intervention for pain at this time, awaiting pain medication. Pt declines any OOB mobility this AM due to "wooziness" and nausea, agreeable to have BLE ACE wrapped in preparation for next session. Pt is dependent to don BLE ACE wrap over thigh-high TEDs. Pt left supine in bed with needs in reach, bed alarm in place.  Session 2: Pt received semi-reclined in bed, agreeable to PT session. Semi-reclind BP 100/71 with THT and BLE ACE wrap. Supine bridges 2 x 10 reps with 10 sec hold on final rep. Supine BP 105/71 following therex. Pt agreeable to attempt sitting up to EOB and is asymptomatic. Supine to sit with CGA with HOB elevated. Seated BLE strengthening therex: LAQ, hip add squeeze 2 x 10 reps each. Seated BP 97/61 following therex, pt remains asymptomatic. Pt requests to return to supine due to onset of L mid-upper back pain radiating to shoulder blade. Nursing able to provide pain medication to patient and provided pt with short-acting hot pack to painful region. Pt left seated in bed  with needs in reach, bed alarm in place.  Therapy Documentation Precautions:  Precautions Precautions: Fall, Back, Cervical Required Braces or Orthoses: Cervical Brace Cervical Brace: Hard collar (c-collar to be donned in sitting, Lt UE sling used for comfort) Restrictions Weight Bearing Restrictions: Yes LUE Weight Bearing: Non weight bearing     Therapy/Group: Individual Therapy   Excell Seltzer, PT, DPT, CSRS  09/19/2021, 11:53 AM

## 2021-09-19 NOTE — Progress Notes (Signed)
Occupational Therapy Session Note  Patient Details  Name: Carlos [name]. MRN: [id number] Date of Birth: 1971-05-26  Today's Date: 09/19/2021 OT Individual Time: 1300-1325 OT Individual Time Calculation (min): 25 min    Short Term Goals: Week 1:  OT Short Term Goal 1 (Week 1): Pt will complete sit<stand during LB self care with no more than Min A OT Short Term Goal 2 (Week 1): Pt will complete UB dressing with Mod A OT Short Term Goal 3 (Week 1): Pt will complete 1/3 components of toileting with no more than Min balance assistance  Skilled Therapeutic Interventions/Progress Updates:    Pt resting in bed upon arrival and reports he still feels a "little queasy" following morning therapy session. Pt agreeable to sitting EOB. Supine>sit EOB with supervision. No verbal cues for following WB precautions. BP as follows with ted hose and ACE wraps: Supine:110/78 EOB: 97/66 no symptoms EOB after 5 mins: 89/68 no symptoms Supine: 108/75 Requested PA Dan order abdominal binder Pt engaged in RUE theraputty tasks for GMC/FMC  Pt remained in bed. All needs within reach and bed alarm activated.  Therapy Documentation Precautions:  Precautions Precautions: Fall, Back, Cervical Required Braces or Orthoses: Cervical Brace Cervical Brace: Hard collar (c-collar to be donned in sitting, Lt UE sling used for comfort) Restrictions Weight Bearing Restrictions: Yes LUE Weight Bearing: Non weight bearing   Pain: Pt c/o 4/10 pain in Lt shoulder/back; repositioned   Therapy/Group: Individual Therapy  Leroy Libman 09/19/2021, 2:12 PM
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
## 2021-09-19 NOTE — Progress Notes (Signed)
Physical Therapy Session Note  Patient Details  Name: Carlos [name]. MRN: [id number] Date of Birth: 11/24/1971  Today's Date: 09/19/2021 PT Individual Time: 1003-1053 PT Individual Time Calculation (min): 50 min   Short Term Goals: Week 1:  PT Short Term Goal 1 (Week 1): Pt will ambulate x 50 ft with LRAD and min A consistently PT Short Term Goal 2 (Week 1): Pt will initiate stair training PT Short Term Goal 3 (Week 1): Pt will complete objective balance assessment to determine fall risk  Skilled Therapeutic Interventions/Progress Updates: Pt presented in bed agreeable to therapy. Pt c/o pain at ribs (L) 7/10, premedicated and no additional intervention requested. Pt noted to not have TED hose and asymptomatic. BP checked 113/76 (86) HR 83. Pt performed supine to sit EOB with supervision and use of bed features. Pt donned shirt with minA for threading. PTA assisted with cervical brace and donning sling to LUE. Pt stating no s/s of hypotension while in sitting. Pt initiated ambulation with grip socks and hemi-walker. Pt required minA for ambulation and cues for sequencing as pt would step then bring hemi walker. Pt noted to ambulate with narrow BOS and mild ataxia. Pt then indicated may feel more stable with shoes as that how he ambulated yesterday. PTA donned shoes for time management. Pt then ambulated ~22f with pt indicating feeling lightheaded and clammy. Pt returned to sitting and BP checked 104/75 (83) HR 49. Pt indicating that he has urgency to use bathroom, transported w/c to bathroom and performed stand pivot with use of wall rail to toilet (+ urinary void). At toilet pt was CGA for LB clothing management. Returned to w/c in same manner with pt stating no change in symptoms and feeling more clammy than previous. W/C then moved to bed and pt performed stand pivot to bed with pt able to perform sit to supine with supervision and use of bed rail. BP checked 111/71 (84) HR 74. Pt left in bed at  end of session with this therapist notifying Faby, RN of pt's current disposition.      Therapy Documentation Precautions:  Precautions Precautions: Fall, Back, Cervical Required Braces or Orthoses: Cervical Brace Cervical Brace: Hard collar (c-collar to be donned in sitting, Lt UE sling used for comfort) Restrictions Weight Bearing Restrictions: Yes LUE Weight Bearing: Non weight bearing General: PT Amount of Missed Time (min): 10 Minutes PT Missed Treatment Reason: Patient ill (Comment)   Therapy/Group: Individual Therapy  Mattisyn Cardona 09/19/2021, 12:42 PM

## 2021-09-20 LAB — GLUCOSE, CAPILLARY
Glucose-Capillary: 134 mg/dL — ABNORMAL HIGH (ref 70–99)
Glucose-Capillary: 163 mg/dL — ABNORMAL HIGH (ref 70–99)
Glucose-Capillary: 167 mg/dL — ABNORMAL HIGH (ref 70–99)
Glucose-Capillary: 153 mg/dL — ABNORMAL HIGH (ref 70–99)

## 2021-09-20 MED ORDER — DIPHENHYDRAMINE HCL 25 MG PO CAPS
25.0000 mg | ORAL_CAPSULE | Freq: Four times a day (QID) | ORAL | Status: DC | PRN
  Administered 2021-09-20: 25 mg via ORAL
  Filled 2021-09-20: qty 1

## 2021-09-20 NOTE — Progress Notes (Signed)
Occupational Therapy Note  Patient Details  Name: Carlos [name]. MRN: [id number] Date of Birth: 1971-11-27  Today's Date: 09/20/2021 OT Missed Time: 75 Minutes Missed Time Reason: Other (comment) (nausea and vomiting)  Pt resting in bed upon arrival with RN present. Pt vomited just prior to therapist entering room. RN administered Zofran. Pt unable to participate, stating he still feels nauseous. Will check back as able.    Leotis Shames [facility] 09/20/2021, 9:18 AM
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
## 2021-09-20 NOTE — Plan of Care (Signed)
  Problem: RH Balance Goal: LTG Patient will maintain dynamic standing with ADLs (OT) Description: LTG:  Patient will maintain dynamic standing balance with assist during activities of daily living (OT)  Flowsheets (Taken 09/20/2021 1453) LTG: Pt will maintain dynamic standing balance during ADLs with: (upgraded JLS) Supervision/Verbal cueing Note: upgraded JLS   Problem: RH Grooming Goal: LTG Patient will perform grooming w/assist,cues/equip (OT) Description: LTG: Patient will perform grooming with assist, with/without cues using equipment (OT) Flowsheets (Taken 09/20/2021 1453) LTG: Pt will perform grooming with assistance level of: Supervision/Verbal cueing Note: upgraded JLS   Problem: RH Bathing Goal: LTG Patient will bathe all body parts with assist levels (OT) Description: LTG: Patient will bathe all body parts with assist levels (OT) Flowsheets (Taken 09/20/2021 1453) LTG: Pt will perform bathing with assistance level/cueing: (upgraded JLS) Supervision/Verbal cueing Note: upgraded JLS   Problem: RH Dressing Goal: LTG Patient will perform upper body dressing (OT) Description: LTG Patient will perform upper body dressing with assist, with/without cues (OT). Flowsheets (Taken 09/20/2021 1453) LTG: Pt will perform upper body dressing with assistance level of: (upgraded JLS) Supervision/Verbal cueing Note: upgraded JLS   Problem: RH Toileting Goal: LTG Patient will perform toileting task (3/3 steps) with assistance level (OT) Description: LTG: Patient will perform toileting task (3/3 steps) with assistance level (OT)  Flowsheets (Taken 09/20/2021 1453) LTG: Pt will perform toileting task (3/3 steps) with assistance level: (upgraded JLS) Supervision/Verbal cueing Note: upgraded JLS   Problem: RH Toilet Transfers Goal: LTG Patient will perform toilet transfers w/assist (OT) Description: LTG: Patient will perform toilet transfers with assist, with/without cues using equipment  (OT) Flowsheets (Taken 09/20/2021 1453) LTG: Pt will perform toilet transfers with assistance level of: (upgraded JLS) Supervision/Verbal cueing Note: upgraded JLS   Problem: RH Tub/Shower Transfers Goal: LTG Patient will perform tub/shower transfers w/assist (OT) Description: LTG: Patient will perform tub/shower transfers with assist, with/without cues using equipment (OT) Flowsheets (Taken 09/20/2021 1453) LTG: Pt will perform tub/shower stall transfers with assistance level of: (upgraded JLS) Supervision/Verbal cueing Note: upgraded JLS

## 2021-09-20 NOTE — Progress Notes (Signed)
Occupational Therapy Session Note  Patient Details  Name: Carlos [name]. MRN: [id number] Date of Birth: March 30, 1971  Today's Date: 09/20/2021 OT Individual Time: 1302-1400 OT Individual Time Calculation (min): 58 min    Short Term Goals: Week 2:  OT Short Term Goal 2 (Week 2): Pt will perform toileting tasks with mod A OT Short Term Goal 3 (Week 2): Pt will complete UB dressing tasks with min A OT Short Term Goal 4 (Week 2): Pt will complete LB dressing tasks with mod A  Skilled Therapeutic Interventions/Progress Updates:    Pt greeted semi-reclined in bed reporting nausea had subsided and agreeable to OT treatment session. Pt complete bed mobility with supervision. BP taken at EOB 85/69. Pt returned to supine and OT donned THT and Ace wrap, then had pt return to sitting where Large Ace wraps were placed around waist to act as abdominal binder. L UE supported in sling and c-collar donned by OT. BP 96/67 in sitting after compression. Sit<>stand from lower bed with min A and hemi-walker. Pt stood for 1 minute, but began feeling clamy and faint so had to return to sitting before we could get a standing BP. Pt took rest break in supine, then returned to sitting. LB there-ex seated EOB toe taps on trash can, hip extension, knee extension, and hip adduction squeezes. Pt reported back and neck pain so he returned to supported upright position in bed for R hand fine motor activity using soft tan theraputty and small beads. Focus on pincer grasp. Pt left semi-reclined in bed with bed alarm on, call bell in reach, and needs met.   Therapy Documentation Precautions:  Precautions Precautions: Fall, Back, Cervical Required Braces or Orthoses: Cervical Brace Cervical Brace: Hard collar (c-collar to be donned in sitting, Lt UE sling used for comfort) Restrictions Weight Bearing Restrictions: Yes LUE Weight Bearing: Non weight bearing Vital Signs: Therapy Vitals Temp: 98.1 F (36.7 C) Temp Source:  Oral Pulse Rate: 97 Resp: 18 BP: 91/60 Patient Position (if appropriate): Lying Pain: Pain Assessment Pain Score: 2  Faces Pain Scale: No hurt PAINAD (Pain Assessment in Advanced Dementia) Breathing: normal Negative Vocalization: none Facial Expression: smiling or inexpressive Body Language: relaxed Consolability: no need to console PAINAD Score: 0    Therapy/Group: Individual Therapy  Valma Cava 09/20/2021, 2:04 PM
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
## 2021-09-20 NOTE — Progress Notes (Signed)
Physical Therapy Session Note  Patient Details  Name: Carlos [name]. MRN: [id number] Date of Birth: [birth year]  Today's Date: 09/20/2021      Short Term Goals: Week 1:  PT Short Term Goal 1 (Week 1): Pt will ambulate x 50 ft with LRAD and min A consistently PT Short Term Goal 2 (Week 1): Pt will initiate stair training PT Short Term Goal 3 (Week 1): Pt will complete objective balance assessment to determine fall risk  Skilled Therapeutic Interventions/Progress Updates: Pt missed 60 minutes skilled PT due to nausea and vomiting earlier in am. Note placed on door to not disturb per request of patient. Will continue efforts.      Therapy Documentation Precautions:  Precautions Precautions: Fall, Back, Cervical Required Braces or Orthoses: Cervical Brace Cervical Brace: Hard collar (c-collar to be donned in sitting, Lt UE sling used for comfort) Restrictions Weight Bearing Restrictions: Yes LUE Weight Bearing: Non weight bearing General: PT Amount of Missed Time (min): 60 Minutes PT Missed Treatment Reason: Patient ill (Comment)   Therapy/Group: Individual Therapy  Chibuike Fleek 09/20/2021, 3:45 PM

## 2021-09-20 NOTE — Progress Notes (Signed)
Occupational Therapy Weekly Progress Note  Patient Details  Name: Coury S Anglin Jr. MRN: [id number] Date of Birth: 02/22/1971  Beginning of progress report period: September 14, 2021 End of progress report period: September 20, 2021  Patient has met 3 of 3 short term goals.  Pt making excellent progress with BADLs and functional tranfsers since admission. Pt continues to have periodic episodes of nausea and hypotension. Pt currently wearing thigh high ted hose and ACE wraps for BLE. Bathing with mod A. UB dressing with mod A and LB dressing with max A. Functional tranfsers with min A HHA.   Patient continues to demonstrate the following deficits: muscle weakness, decreased cardiorespiratoy endurance, and impaired timing and sequencing, unbalanced muscle activation, and decreased coordination and therefore will continue to benefit from skilled OT intervention to enhance overall performance with BADL and Reduce care partner burden.  Patient progressing toward long term goals..Upgraded goals to supervision  Continue plan of care.  OT Short Term Goals Week 1:  OT Short Term Goal 1 (Week 1): Pt will complete sit<stand during LB self care with no more than Min A OT Short Term Goal 1 - Progress (Week 1): Met OT Short Term Goal 2 (Week 1): Pt will complete UB dressing with Mod A OT Short Term Goal 2 - Progress (Week 1): Met OT Short Term Goal 3 (Week 1): Pt will complete 1/3 components of toileting with no more than Min balance assistance OT Short Term Goal 3 - Progress (Week 1): Met Week 2:  OT Short Term Goal 2 (Week 2): Pt will perform toileting tasks with mod A OT Short Term Goal 3 (Week 2): Pt will complete UB dressing tasks with min A OT Short Term Goal 4 (Week 2): Pt will complete LB dressing tasks with mod A    Lanier, Thomas Chappell 09/20/2021, 6:36 AM  

## 2021-09-21 LAB — GLUCOSE, CAPILLARY
Glucose-Capillary: 157 mg/dL — ABNORMAL HIGH (ref 70–99)
Glucose-Capillary: 155 mg/dL — ABNORMAL HIGH (ref 70–99)
Glucose-Capillary: 158 mg/dL — ABNORMAL HIGH (ref 70–99)

## 2021-09-21 MED ORDER — MIDODRINE HCL 5 MG PO TABS
10.0000 mg | ORAL_TABLET | Freq: Three times a day (TID) | ORAL | Status: DC
  Administered 2021-09-21 – 2021-10-04 (×39): 10 mg via ORAL
  Filled 2021-09-21 (×39): qty 2
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
## 2021-09-21 NOTE — Progress Notes (Signed)
Physical Therapy Session Note  Patient Details  Name: Carlos [name]. MRN: [id number] Date of Birth: Feb 27, 1971  Today's Date: 09/21/2021 PT Individual Time: 7169-6789 PT Individual Time Calculation (min): 65 min   Short Term Goals: Week 1:  PT Short Term Goal 1 (Week 1): Pt will ambulate x 50 ft with LRAD and min A consistently PT Short Term Goal 2 (Week 1): Pt will initiate stair training PT Short Term Goal 3 (Week 1): Pt will complete objective balance assessment to determine fall risk  Skilled Therapeutic Interventions/Progress Updates: Pt presented in bed with nsg present agreeable to therapy. Pt c/o pain back and L ribs with nsg administering pain meds at start of session. While nsg administering meds PTA donned TED hose and ace bandages total A. BP checked in supine 111/73 (74). Once completed pt performed ankle pumps, heel slides, and SLR x 20 bilaterally. Pt then performed supine to sit with supervision and use of bed features. Pt then donned button up shirt with minA for threading. Pt asymptomatic and continued activities. Pt then performed LAQ x 20 with slight increase in back pain. Pt stating has been having some increased radiating pain in BLE R>L particularly with increased cx flexion. PTA reinforced cervical precautions and discussed even in bed without brace easy to go into flexion when repositioning. Pt verbalized understanding but may need to continue to need reinforcement. Pt performed Sit to stand 2 x 5 for BLE strengthening and static balance. Pt with some increased dizziness BP checked 105/72 (83) HR 79. Resolved with seated rest. While sitting performed shoulder shrugs, scapular retractions, RUE chest press with 2lb dowel x 10-12 ea. Pt then performed toe taps to target 2 x 10 with CGA and cues to increase BOS. Pt with increasing back pain 8/10 thus returned to supine with supervision. Pt requesting to rest as attempting to complete all 3 therapy sessions today. Pt left in bed  with bed alarm on, call bell within reach and needs met.      Therapy Documentation Precautions:  Precautions Precautions: Fall, Back, Cervical Required Braces or Orthoses: Cervical Brace Cervical Brace: Hard collar (c-collar to be donned in sitting, Lt UE sling used for comfort) Restrictions Weight Bearing Restrictions: Yes LUE Weight Bearing: Non weight bearing General: PT Amount of Missed Time (min): 10 Minutes PT Missed Treatment Reason: Patient fatigue Vital Signs:   Pain: Pain Assessment Pain Score: 5    Therapy/Group: Individual Therapy  [name] 09/21/2021, 12:44 PM

## 2021-09-21 NOTE — Progress Notes (Signed)
Occupational Therapy Note  Patient Details  Name: Carlos [name]. MRN: [id number] Date of Birth: 13-Jan-1971  Today's Date: 09/21/2021 OT Missed Time: 19 Minutes Missed Time Reason: Other (comment) (pt not group appropriate at this point d/t BP issues)    Pt missed 60 mins of group session as pt continues to present with fluctuating BPs therefore pt unsafe for group participation at this point. Will f/u as time allows to make up missed minutes.   Corinne Ports [facility] 09/21/2021, 4:08 PM
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
## 2021-09-21 NOTE — Progress Notes (Signed)
Occupational Therapy Session Note  Patient Details  Name: Carlos [name]. MRN: [id number] Date of Birth: 08/16/1971  Today's Date: 09/21/2021 OT Individual Time: 1045-1200 OT Individual Time Calculation (min): 75 min    Short Term Goals: Week 2:  OT Short Term Goal 2 (Week 2): Pt will perform toileting tasks with mod A OT Short Term Goal 3 (Week 2): Pt will complete UB dressing tasks with min A OT Short Term Goal 4 (Week 2): Pt will complete LB dressing tasks with mod A  Skilled Therapeutic Interventions/Progress Updates:  Pt greeted supine in bed agreeable to OT intervention. Pt completed bed mobility with CGA. Pt reports no dizziness EOB with abdomen wrapped (ABD binder never arrived per pt), thigh high TEDs and ace wraps donned. Donned sling and C- collar from EOB with total A. BP from EOB 102/65.  Pt completed functional mobility to sink with HW with MIN A where pt sat to complete oral care/ face washing with supervision. Pt completed stand pivot transfer to [facility] with HW with MIN A for toileting, CGA for 3/3 toileting tasks. Pt then reports blurry vision and noted to become pale. Stand pivot back to bed with HW with MIN A where pt returned to supine with CGA; BP from supine 97/69 with pt continuing to report blurry vision. Remainder of session conducted from bed therefore spent time going over SCI education. Pt with questions regarding BP in relation to SCI as well as his level of injury. Issued handouts on OH and SCI levels and reviewed all handouts with pt. Pt additionally asked for [facility] HEP. Issued HEP that included therex below with recommendation for x10 reps 3x daily: Thumb opposition Wrist pronation/supination Wrist flexion/extension Finger spreading Radial/ulnar deviation MP flexion Composite flexion/ extension  Digit lifts Pt left supine in bed with bed alarm activated and all needs within reach.   Therapy Documentation Precautions:  Precautions Precautions: Fall, Back,  Cervical Required Braces or Orthoses: Cervical Brace Cervical Brace: Hard collar (c-collar to be donned in sitting, Lt UE sling used for comfort) Restrictions Weight Bearing Restrictions: Yes LUE Weight Bearing: Non weight bearing    Pain: pt reports no pain during session     Therapy/Group: Individual Therapy  Precious Haws 09/21/2021, 12:12 PM

## 2021-09-22 DIAGNOSIS — I951 Orthostatic hypotension: Secondary | ICD-10-CM

## 2021-09-22 DIAGNOSIS — T07XXXA Unspecified multiple injuries, initial encounter: Secondary | ICD-10-CM

## 2021-09-22 DIAGNOSIS — F329 Major depressive disorder, single episode, unspecified: Secondary | ICD-10-CM

## 2021-09-22 LAB — GLUCOSE, CAPILLARY
Glucose-Capillary: 159 mg/dL — ABNORMAL HIGH (ref 70–99)
Glucose-Capillary: 133 mg/dL — ABNORMAL HIGH (ref 70–99)
Glucose-Capillary: 131 mg/dL — ABNORMAL HIGH (ref 70–99)
Glucose-Capillary: 157 mg/dL — ABNORMAL HIGH (ref 70–99)
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
## 2021-09-23 LAB — GLUCOSE, CAPILLARY
Glucose-Capillary: 124 mg/dL — ABNORMAL HIGH (ref 70–99)
Glucose-Capillary: 168 mg/dL — ABNORMAL HIGH (ref 70–99)
Glucose-Capillary: 135 mg/dL — ABNORMAL HIGH (ref 70–99)
Glucose-Capillary: 136 mg/dL — ABNORMAL HIGH (ref 70–99)

## 2021-09-23 NOTE — Progress Notes (Signed)
Physical Therapy Session Note  Patient Details  Name: Carlos [name]. MRN: [id number] Date of Birth: 1971/08/27  Today's Date: 09/23/2021 PT Individual Time: 0037-0488 PT Individual Time Calculation (min): 60 min   Short Term Goals: Week 1:  PT Short Term Goal 1 (Week 1): Pt will ambulate x 50 ft with LRAD and min A consistently PT Short Term Goal 2 (Week 1): Pt will initiate stair training PT Short Term Goal 3 (Week 1): Pt will complete objective balance assessment to determine fall risk  Skilled Therapeutic Interventions/Progress Updates: Pt presented in bed agreeable to therapy. Pt c/o back pain at incision site, no additional intervention requested, rest breaks provided as needed. PTA donned TED hose and ace bandages total A. BP checked in supine 117/85 (95) HR 75. Pt performed supine to sit at EOB with supervision and use of bed features. Pt donned T-shirt with set up and increased time. Donned cervical collar with minA. Performed stand pivot transfer with hemiwalker minA as pt with increased lateral sway during pivot to w/c. BP checked after transfer to w/c 125/81 (95) HR 72. Pt transported to rehab gym and performed stand pivot to high/low mat. PTA placed 4lb cuff on pt's ankles for increased feedback. Pt ambulated 80f x2  with seated rest between bouts. Pt required minA with fair sequencing with HW and intermittent verbal cues to increase BOS.  BP taken after ambulation 109/82 (92) HR 79. Pt with no c/o dizziness or nausea. Pt then participated in alternating toe taps 2 x 10 with x 1 LOB to L requiring minA from PTA for recovery. Pt also participated in 2 rounds of corn hole reaching with RUE only including high, low, and crossing midline. Pt then indicating increasing need for toilet for BM. Performed stand pivot to w/c with CGA and pt transported back to room. [facility] and pt's nurse ACaryl Pina LPN notified of  pt's disposition.      Therapy Documentation Precautions:  Precautions Precautions: Fall, Back, Cervical Required Braces or Orthoses: Cervical Brace Cervical Brace: Hard collar (c-collar to be donned in sitting, Lt UE sling used for comfort) Restrictions Weight Bearing Restrictions: No LUE Weight Bearing: Weight bearing as tolerated General:   Vital Signs:  Pain: Pain Assessment Pain Scale: 0-10 Pain Score: 3  Pain Location: Back Pain Intervention(s): Medication (See eMAR)   Therapy/Group: Individual Therapy  Jaela Yepez Karlea Mckibbin, PTA  09/23/2021, 12:08 PM
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
## 2021-09-23 NOTE — Progress Notes (Signed)
Pt refused insulin this AM and PM, pt educated on importance of insulin. Pt verbalizes understanding but still refuses.  Sheela Stack, LPN

## 2021-09-24 DIAGNOSIS — F4322 Adjustment disorder with anxiety: Secondary | ICD-10-CM

## 2021-09-24 LAB — GLUCOSE, CAPILLARY
Glucose-Capillary: 147 mg/dL — ABNORMAL HIGH (ref 70–99)
Glucose-Capillary: 146 mg/dL — ABNORMAL HIGH (ref 70–99)
Glucose-Capillary: 181 mg/dL — ABNORMAL HIGH (ref 70–99)
Glucose-Capillary: 153 mg/dL — ABNORMAL HIGH (ref 70–99)
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
## 2021-09-24 NOTE — Progress Notes (Signed)
Patient ID: Carlos [name]., male   DOB: 05-10-1971, 50 y.o.   MRN: [id number]  SW made contact with pt wife Larene Beach [phone]) to discuss discharge plan. Pt will d/c to home and will have support from his mother who lives next door, and his father will fill in on days when his mother is at chemo treatment. Wife will be home by 5pm. SW informed will f/u tomorrow to give updates from team conference.   Loralee Pacas, MSW, [place] Office: [phone] Cell: [phone] Fax: [phone]

## 2021-09-24 NOTE — Progress Notes (Signed)
Occupational Therapy Session Note  Patient Details  Name: Carlos [name]. MRN: [id number] Date of Birth: 12/09/1971  Today's Date: 09/24/2021 OT Individual Time: 6116-4353 OT Individual Time Calculation (min): 25 min    Short Term Goals: Week 1:  OT Short Term Goal 1 (Week 1): Pt will complete sit<stand during LB self care with no more than Min A OT Short Term Goal 1 - Progress (Week 1): Met OT Short Term Goal 2 (Week 1): Pt will complete UB dressing with Mod A OT Short Term Goal 2 - Progress (Week 1): Met OT Short Term Goal 3 (Week 1): Pt will complete 1/3 components of toileting with no more than Min balance assistance OT Short Term Goal 3 - Progress (Week 1): Met Week 2:  OT Short Term Goal 2 (Week 2): Pt will perform toileting tasks with mod A OT Short Term Goal 3 (Week 2): Pt will complete UB dressing tasks with min A OT Short Term Goal 4 (Week 2): Pt will complete LB dressing tasks with mod A   Skilled Therapeutic Interventions/Progress Updates:    Pt greeted at time of session sitting up in recliner agreeable to OT session, no pain reported. Focus of session on standing balance/tolerance while performing FMC/GMC task of retrieving clips (green and red) for 2 rounds from gait belt around waist. Focused on both pincer and 3 jaw chuck pinch using R hand only and adhered well to WB precautions. Collar worn for all standing activity. 1x10 single leg stance/marches in place. Note no episodes of OH/dizziness throughout session. Back in chair alarm on call bell in reach.   Therapy Documentation Precautions:  Precautions Precautions: Fall, Back, Cervical Required Braces or Orthoses: Cervical Brace Cervical Brace: Hard collar (c-collar to be donned in sitting, Lt UE sling used for comfort) Restrictions Weight Bearing Restrictions: No LUE Weight Bearing: Weight bearing as tolerated    Therapy/Group: Individual Therapy  Viona Gilmore 09/24/2021, 7:30 AM

## 2021-09-24 NOTE — Progress Notes (Signed)
Physical Therapy Weekly Progress Note  Patient Details  Name: Carlos [name]. MRN: [id number] Date of Birth: 01/21/1971  Beginning of progress report period: September 14, 2021 End of progress report period: September 24, 2021  Today's Date: 09/24/2021 PT Individual Time: 1300-1400 PT Individual Time Calculation (min): 60 min   Patient has met 1 of 3 short term goals.  Pt is making gradual progress towards therapy goals. He is currently at Supervision level for bed mobility, transfers with HW and min A, and can ambulate up to 65 ft with HW and min A. Pt continues to exhibit decreased BLE coordination and strength, balance deficits in standing, and ongoing orthostatic hypotension that limits his tolerance at times for functional standing activities.  Patient continues to demonstrate the following deficits muscle weakness, decreased cardiorespiratoy endurance, abnormal tone, unbalanced muscle activation, and decreased coordination, and decreased standing balance, decreased postural control, decreased balance strategies, and difficulty maintaining precautions and therefore will continue to benefit from skilled PT intervention to increase functional independence with mobility.  Patient progressing toward long term goals..  Continue plan of care.  PT Short Term Goals Week 1:  PT Short Term Goal 1 (Week 1): Pt will ambulate x 50 ft with LRAD and min A consistently PT Short Term Goal 1 - Progress (Week 1): Met PT Short Term Goal 2 (Week 1): Pt will initiate stair training PT Short Term Goal 2 - Progress (Week 1): Progressing toward goal PT Short Term Goal 3 (Week 1): Pt will complete objective balance assessment to determine fall risk PT Short Term Goal 3 - Progress (Week 1): Progressing toward goal Week 2:  PT Short Term Goal 1 (Week 2): Pt will ambulate x 100 ft with LRAD and min A PT Short Term Goal 2 (Week 2): Pt will initiate stair training PT Short Term Goal 3 (Week 2): Pt will tolerate  standing x 5 min during functional task without onset of OH  Skilled Therapeutic Interventions/Progress Updates:    Pt received seated in bed, agreeable to PT session. No complaints of pain during session. Per morning PT pt had onset of OH during session. Assisted pt with donning thigh-high TEDs and ACE wrap prior to getting OOB. Supine to sit with Supervision. Sit to stand and stand pivot transfer to w/c with HW and min A for balance. Seated BP 112/67. Ambulation x 50 ft with HW and min A for balance. Pt exhibits decreased RLE clearance due to hip flexor and HS weakness. When pt attempts to increase hip flexion on the R he has LOB to the L in standing, mod A to correct. Standing alt L/R marches with HW and min A for balance. Standing alt L/R airex taps with HW and min A for balance, 2 x 10 reps each for LE strengthening and coordination. Pt has onset of dizziness in standing, seated BP 101/73 immediately following standing activity. Seated BP 108/73 after 5 min seated rest break. Seated BLE strengthening therex: marches, LAQ, HS curls with green theraband, hip add with green theraband x 10 reps each. Pt agreeable to remain seated in chair at end of session to improve upright tolerance. Pt left seated in recliner in room with needs in reach.  Therapy Documentation Precautions:  Precautions Precautions: Fall, Back, Cervical Required Braces or Orthoses: Cervical Brace Cervical Brace: Hard collar (c-collar to be donned in sitting, Lt UE sling used for comfort) Restrictions Weight Bearing Restrictions: No LUE Weight Bearing: Weight bearing as tolerated     Therapy/Group:  Individual Therapy   Excell Seltzer, PT, DPT, CSRS 09/24/2021, 5:22 PM
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
## 2021-09-24 NOTE — Progress Notes (Signed)
Occupational Therapy Session Note  Patient Details  Name: Carlos [name]. MRN: [id number] Date of Birth: August 22, 1971  Today's Date: 09/24/2021 OT Individual Time: 4650-3546 OT Individual Time Calculation (min): 40 min    Short Term Goals: Week 2:  OT Short Term Goal 2 (Week 2): Pt will perform toileting tasks with mod A OT Short Term Goal 3 (Week 2): Pt will complete UB dressing tasks with min A OT Short Term Goal 4 (Week 2): Pt will complete LB dressing tasks with mod A     Skilled Therapeutic Interventions/Progress Updates:    Pt sitting up in recliner head supported but without C-collar donned.  Pt reports the cervical doctor told him he could even though therapy has been instructing him to wear OOB. Pt reports he has been bearing weight through LUE to lean on when eating meals in bed despite being aware that he has NWB precautions. Pt c/o soreness and tenderness "between my shoulder blades".  Educated pt on current injury and mechanism of healing of fracture sites for conservative treatment and surgical fixation to encourage better follow through of precautions.  Pt reports more receptiveness to follow through after education provided.  Also encouraged pt to refrain from lifting LUE above 90 degrees to protect clavicle and scapula injuries.  Palpated upper traps and noted trigger point as well as one in rhomboid/middle trap region.  Recommended moist heat application order to nursing to help with pts pain.  Call bell in reach, seat alarm on at end of session.  Therapy Documentation Precautions:  Precautions Precautions: Fall, Back, Cervical Required Braces or Orthoses: Cervical Brace Cervical Brace: Hard collar (c-collar to be donned in sitting, Lt UE sling used for comfort) Restrictions Weight Bearing Restrictions: No LUE Weight Bearing: Weight bearing as tolerated    Therapy/Group: Individual Therapy  Ezekiel Slocumb 09/24/2021, 4:33 PM

## 2021-09-24 NOTE — Progress Notes (Signed)
Physical Therapy Session Note  Patient Details  Name: Carlos [name]. MRN: [id number] Date of Birth: 28-Mar-1971  Today's Date: 09/24/2021 PT Individual Time: 1130-1200 PT Individual Time Calculation (min): 30 min   Short Term Goals: Week 1:  PT Short Term Goals - Week 1 PT Short Term Goal 1 (Week 1): Pt will ambulate x 50 ft with LRAD and min A consistently PT Short Term Goal 1 - Progress (Week 1): Met PT Short Term Goal 2 (Week 1): Pt will initiate stair training PT Short Term Goal 2 - Progress (Week 1): Progressing toward goal PT Short Term Goal 3 (Week 1): Pt will complete objective balance assessment to determine fall risk PT Short Term Goal 3 - Progress (Week 1): Progressing toward goal PT Short Term Goals - Week 2 PT Short Term Goal 1 (Week 2): Pt will ambulate x 100 ft with LRAD and min A PT Short Term Goal 2 (Week 2): Pt will initiate stair training PT Short Term Goal 3 (Week 2): Pt will tolerate standing x 5 min during functional task without onset of OH  Skilled Therapeutic Interventions/Progress Updates:  Patient supine in bed on entrance to room. Patient alert and agreeable to PT session despite time not being on schedule. Pt relates change to new BP medication and attempt to work in session with no compression to BLE.   Patient with no pain complaint throughout session, but does have episode of orthostasis requiring return to bed and supine with BLE in elevated position for brief period. See details below.  Therapeutic Activity: Bed Mobility: Patient performed supine --> sit with supervision. VC/ tc required for technique. In seated, BP obtained at 108/75. Transfers: Patient performed sit<>stand and stand pivot transfers throughout session with CGA. Provided verbal cues for controlled pivot stepping with use of HW.   Neuromuscular Re-ed: NMR performed for improvements in motor control and coordination, balance, sequencing, judgement, and self confidence/ efficacy in  performing all aspects of mobility at highest level of independence. NMR facilitated during session with focus on standing balance, muscle activation in BLE, motor control, proprioception. Pt guided in standing toe touches to 6" step with RUE support to rail and LUE supported on rail for balance. Able to complete x5 to RLE then LLE requiring seated therapeutic rest. Second bout to second step and pt completing x5 with RLE then complaining of lightheadedness and "seeing stars" requesting to sit. Pt seated in w/c in gym and BP obtained at 76/59 with no relief of symptoms and addition of nausea.   Returned to bed with pt able to perform stand pivot with MinA and requiring Mod A for BLE to bed surface. Bed controls used to elevate BLE and BP obtained again at 100/77. Pt relates symptoms alleviating. RN arrival with BP and pain medication. Education provided re: orthostasis and continued need for TED hose as well as ACE wraps in order to assist with body's inability at this time to automatically regulate BP effectively with transitions in body positioning and physical efforts.  Patient supine  in bed at end of session with brakes locked, bed alarm set, and all needs within reach. NT in room.     Therapy Documentation Precautions:  Precautions Precautions: Fall, Back, Cervical Required Braces or Orthoses: Cervical Brace Cervical Brace: Hard collar (c-collar to be donned in sitting, Lt UE sling used for comfort) Restrictions Weight Bearing Restrictions: No LUE Weight Bearing: Weight bearing as tolerated General:   Vital Signs: Seated EOB prior to session: 108/  75, pulse 86 Upon relating dizziness/ seeing spots and nausea and return to seated position: 76/ 59 Return to supine with elevated BLE: 100/ 77 Supine with HOB slightly elevated:   Pain: No complaint of pain throughout session.   Therapy/Group: Individual Therapy  Alger Simons PT, DPT 09/24/2021, 12:31 PM

## 2021-09-25 ENCOUNTER — Inpatient Hospital Stay (HOSPITAL_COMMUNITY): Payer: BC Managed Care – PPO

## 2021-09-25 LAB — GLUCOSE, CAPILLARY
Glucose-Capillary: 123 mg/dL — ABNORMAL HIGH (ref 70–99)
Glucose-Capillary: 127 mg/dL — ABNORMAL HIGH (ref 70–99)
Glucose-Capillary: 139 mg/dL — ABNORMAL HIGH (ref 70–99)
Glucose-Capillary: 121 mg/dL — ABNORMAL HIGH (ref 70–99)

## 2021-09-25 IMAGING — DX DG CHEST 2V
3 series · 3 of 3 positions shown · non-contrast
Comparison: [DATE]

CLINICAL DATA: Pneumothorax

EXAM:
CHEST - 2 VIEW

[w chest lat]
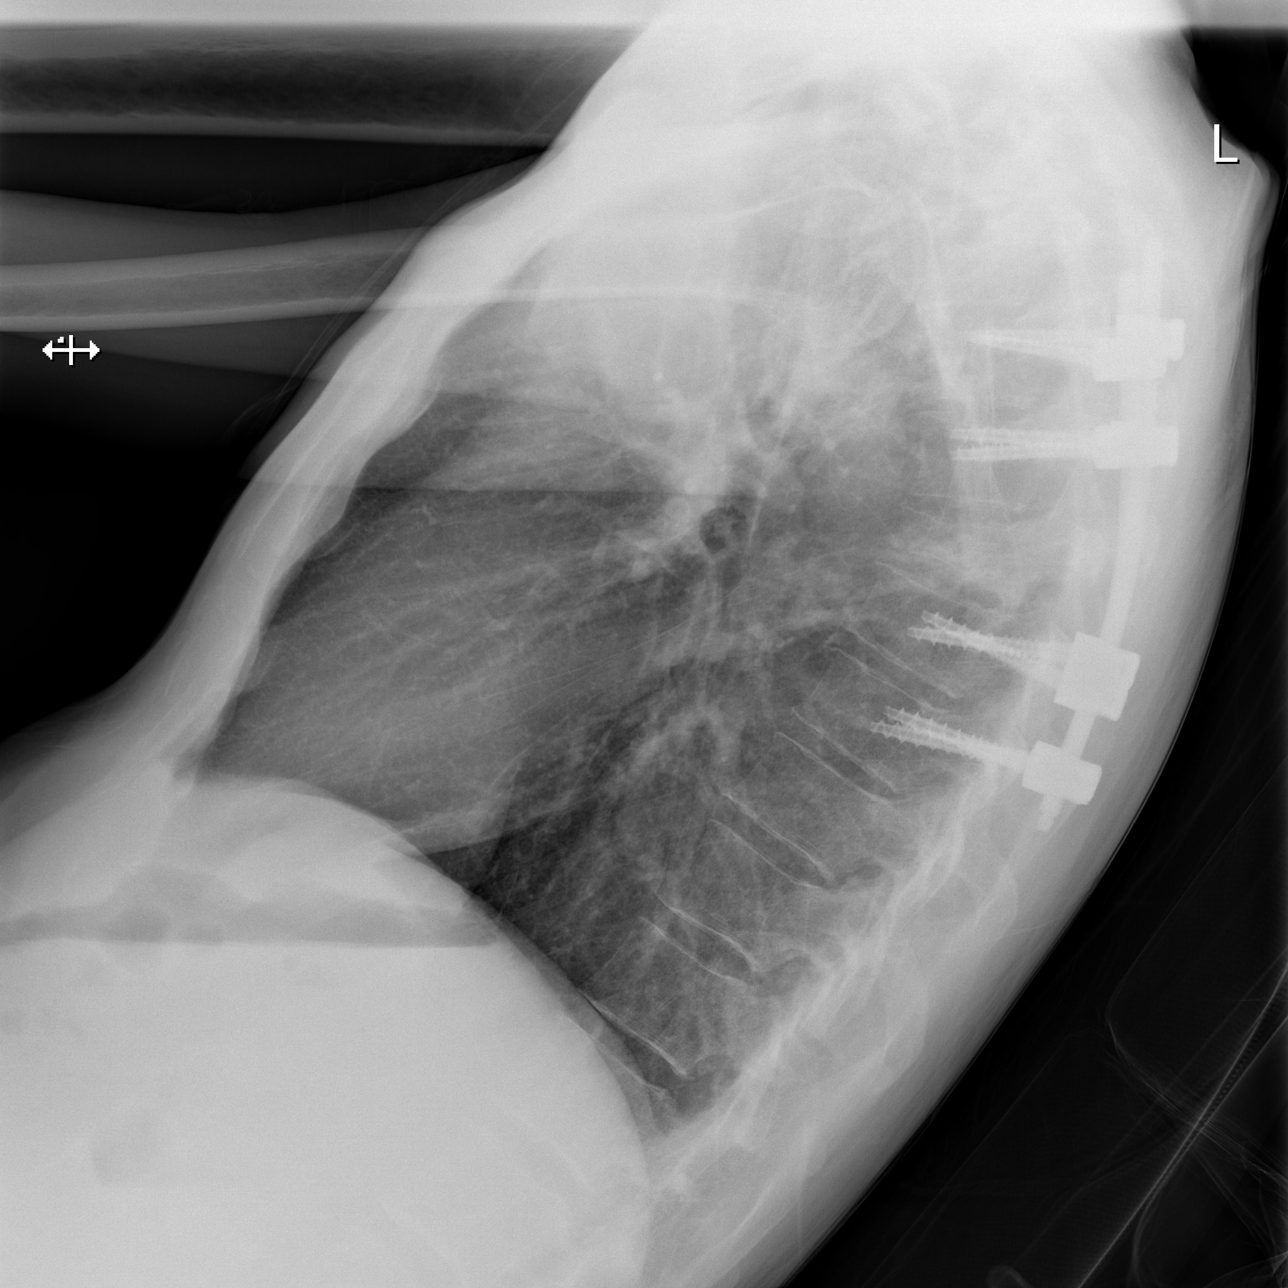

[x chest ap (1 of 2)]
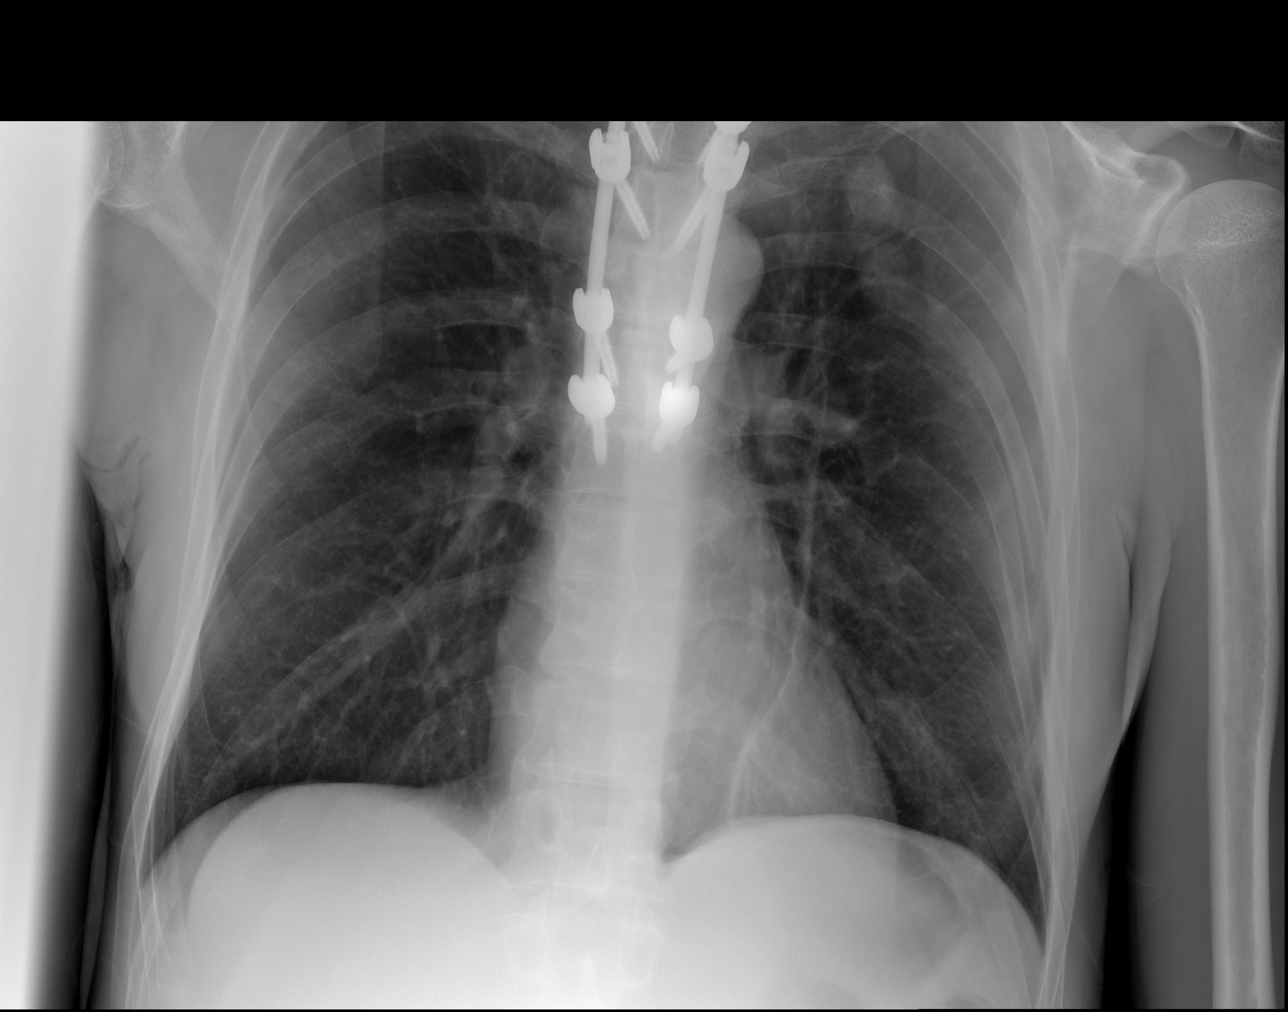

[x chest ap (2 of 2)]
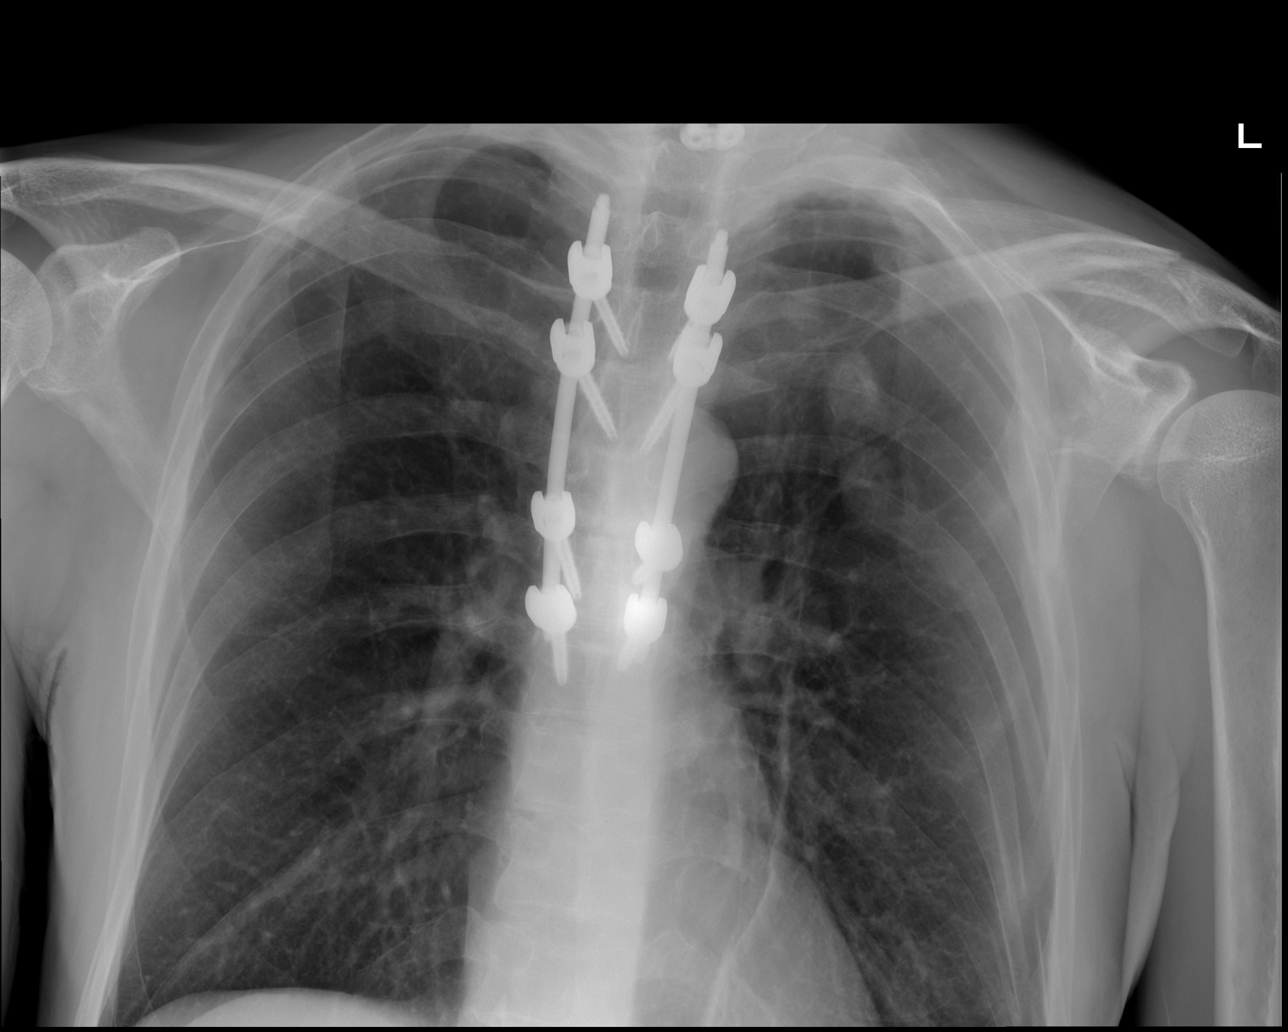

[3 of 3 positions shown; findings below may reference images not displayed]

FINDINGS: Normal heart size, mediastinal contours, and pulmonary vascularity.

Lungs hyperinflated with linear subsegmental atelectasis versus
scarring in lower LEFT lung.

Mild LEFT apical scarring.

Remaining lungs clear.

No acute infiltrate, pleural effusion, or pneumothorax.

Prior cervical and interval thoracic spine fusions.

Multiple old LEFT upper rib fractures.
IMPRESSION: Atelectasis versus scarring in lower LEFT lung.

No evidence of infiltrate, pleural effusion or pneumothorax.

Multiple LEFT rib fractures.
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
## 2021-09-25 NOTE — Progress Notes (Signed)
Patient ID: Carlos Frieze., male   DOB: 19-Oct-1971, 50 y.o.   MRN: [id number]  SW met with pt in room to provide updates from team conference, on gains made, and d/c date remains 10/20. SW answered pt questions with regard to d/c recommendations. SW informed once final recommendations have been made by team, SW will share information. Pt would like handicap placard. Pt aware SW to follow-up with his wife.  [id number] made contact with pt wife Carlos [name] [phone]) to provide above updates. SW discussed family education. She will f/u with SW about date and time.   Loralee Pacas, MSW, [place] Office: [phone] Cell: [phone] Fax: [phone]

## 2021-09-25 NOTE — Patient Care Conference (Signed)
Inpatient RehabilitationTeam Conference and Plan of Care Update Date: 09/25/2021   Time: 11:10 AM    Patient Name: Carlos [name].      Medical Record Number: [id number]  Date of Birth: Dec 14, 1971 Sex: Male         Room/Bed: 4W14C/4W14C-01 Payor Info: Payor: [place] / Plan: BCBS COMM PPO / Product Type: *No Product type* /    Admit Date/Time:  09/13/2021  3:24 PM  Primary Diagnosis:  Quadriplegia [facility])  Hospital Problems: Principal Problem:   Quadriplegia ([place]) Active Problems:   Multiple trauma   Adjustment reaction with anxiety    Expected Discharge Date: Expected Discharge Date: 10/04/21  Team Members Present: Physician leading conference: Dr. Courtney Heys Social Worker Present: Loralee Pacas, Shepherd Nurse Present: Dorthula Nettles, RN PT Present: Excell Seltzer, PT OT Present: Roanna Epley, COTA;Jennifer Tamala Julian, OT PPS Coordinator present : Gunnar Fusi, SLP     Current Status/Progress Goal Weekly Team Focus  Bowel/Bladder   Continent Of Bowel and Bladder. LBM  09/24/21  Maintain Continence Og Bowel and Bladder      Swallow/Nutrition/ Hydration             ADL's   bathing-min A: LB dressing-min A: UB dressing-min A: BSC transfers-min A: toileting-mod A  upgraded to supervision overall; min A LB dressing  BADL retraining, functional transfers, toileting, activity tolerancel BUE GMC/FMC   Mobility   Supervision bed mobility, min A transfers with HW, gait up to 70 ft with HW min A; orthostatic  CGA to min A overall  balance, gait, standing tolerance, BP management   Communication             Safety/Cognition/ Behavioral Observations            Pain   Requires Oxycontin for Reported pain >6  Pain <3. Assess Q Shift and PRN      Skin   Incision to spine clean and Intact. No Drainage  No New Skin breakdown. Assess Q shift.        Discharge Planning:  Pt will d/c to home with support from his mother, father, and his wife. Mother will provide  assistance during the day; when mother in chemo treatment, his father will provide support. Pt wife will be available for evenings.   Team Discussion: Pain improved, BP controlled. Chest x-ray today per Trauma. Continent B/B. PRN/scheduled medications for pain. Picking at scabs. Incisions look good. Using ace wraps and ted's for BP control.  Patient on target to meet rehab goals: yes, supervision bed mobility, min assist with hemi-walker. Bathing and dressing min assist, toileting mod assist. Sitting EOB and BP doing well. Upgraded goals to supervision.  *See Care Plan and progress notes for long and short-term goals.   Revisions to Treatment Plan:  Adjusting medications, monitor lab work, and spasticity.   Teaching Needs: Family education, medication management, pain management, BP management, skin/wound care, weight bearing precautions, transfer training, gait training, balance training, endurance training, safety awareness.  Current Barriers to Discharge: Decreased caregiver support, Medical stability, Home enviroment access/layout, Wound care, Lack of/limited family support, Weight, Weight bearing restrictions, and Medication compliance  Possible Resolutions to Barriers: Family education with spouse and parents, continue current medications, educate BP management.     Medical Summary Current Status: need f/u PA/lateral CXR- for pneumothorax- LBM 10/9- continent B/B; pain controlled; sleeping well- BG's look good- incisions look good but picks at scabs  Barriers to Discharge: Decreased family/caregiver support;Home enviroment access/layout;Medical stability;New diabetic;Weight;Wound  care;Weight bearing restrictions  Barriers to Discharge Comments: low BP- is biggest limiters- using hemiwalker 43f- balance issues; using ACE wraps/TEDs- with these not abd binder- BP does ok, but drops when not used. Possible Resolutions to [employer] focus- keep BP controlled- not too low; wean  benzo's ; monitor for spasticity; NWB LUE still; making progress- d/c 10/20   Continued Need for Acute Rehabilitation Level of Care: The patient requires daily medical management by a physician with specialized training in physical medicine and rehabilitation for the following reasons: Direction of a multidisciplinary physical rehabilitation program to maximize functional independence : Yes Medical management of patient stability for increased activity during participation in an intensive rehabilitation regime.: Yes Analysis of laboratory values and/or radiology reports with any subsequent need for medication adjustment and/or medical intervention. : Yes   I attest that I was present, lead the team conference, and concur with the assessment and plan of the team.   JCristi Loron10/10/2021, 5:26 PM

## 2021-09-25 NOTE — Progress Notes (Signed)
Occupational Therapy Session Note  Patient Details  Name: Carlos [name]. MRN: [id number] Date of Birth: [birth year] Today's Date: 09/25/2021 OT Individual Time: 6599-3570 OT Individual Time Calculation (min): 54 min     Short Term Goals: Week 2:  OT Short Term Goal 2 (Week 2): Pt will perform toileting tasks with mod A OT Short Term Goal 3 (Week 2): Pt will complete UB dressing tasks with min A OT Short Term Goal 4 (Week 2): Pt will complete LB dressing tasks with mod A  Skilled Therapeutic Interventions/Progress Updates:    Pt received seated in recliner, no c/o pain, agreeable to therapy. Session focus on self-care retraining, activity tolerance, R grip strengthening, func transfers, BLE strengthening in prep for improved ADL/IADL/func mobility performance + decreased caregiver burden. Reports he would most like to work on RLE strengthening. BP in sitting read at 96/ 65 (72), denies any sx. Donned hard C collar with total A, pt later able to doff in bed with S. Short amb transfer > w/c with CGA and hemiwalker. Total A w/c transport to and from gym 2/2 time management and energy conservation. Seated in w/c, pt complete 2x15 of the following against level 4 resistive band + 3 lb ankle weight during second set: B seated marches, knee extension, knee flexion. Pt reports urgent need for BM. Short ambulatory transfer in room > toilet with CGA and use of hemiwalker and R grab rail. CGA for toileting tasks, no void. Back in gym, reassessed B grip strength with below results:  R: 38, 36, 35; average of 36.33 lbs L: 40, 44, 42; average of 42 lbs , noted improved from 10/4 from R: 28 lbs, L: 34 lbs  Pt completed 2x15 R grip squeezes with grip strengthener set at 6.6 then 34 lbs resistance. Reports exercise as moderately challenging.   Short amb transfer > bed same manner as before and returned to supine with S.  Pt left with HOB at 30 degrees with bed alarm engaged, call bell in reach, and all  immediate needs met.    Therapy Documentation Precautions:  Precautions Precautions: Fall, Back, Cervical Required Braces or Orthoses: Cervical Brace Cervical Brace: Hard collar (c-collar to be donned in sitting, Lt UE sling used for comfort) Restrictions Weight Bearing Restrictions: No LUE Weight Bearing: Weight bearing as tolerated  Pain: no c/o   ADL: See Care Tool for more details.  Therapy/Group: Individual Therapy  Volanda Napoleon MS, OTR/L  09/25/2021, 6:55 AM

## 2021-09-25 NOTE — Progress Notes (Signed)
Physical Therapy Session Note  Patient Details  Name: Carlos [name]. MRN: [id number] Date of Birth: [birth year]  Today's Date: 09/25/2021 PT Individual Time: 4098-1191 PT Individual Time Calculation (min): 44 min   Short Term Goals: Week 2:  PT Short Term Goal 1 (Week 2): Pt will ambulate x 100 ft with LRAD and min A PT Short Term Goal 2 (Week 2): Pt will initiate stair training PT Short Term Goal 3 (Week 2): Pt will tolerate standing x 5 min during functional task without onset of OH  Skilled Therapeutic Interventions/Progress Updates: Pt presents sitting in recliner and agreeable to therapy.  TED hose and ace wraps already applied by previous therapist.  Pt transfers sit to stand w/ CGA.  Pt amb x 8' to w/c w/ HW and CGA.  Pt wheeled to Dayroom for time conservation.  Pt performed multiple trials of cone obstacle negotiation w/ CGA.  Pt had no LOB and maintained HW w/o lifting over cones per request.  Pt amb w/ HW stepping onto Aire[employer] cushion and then 6" platform w/ LOB noted, cueing for sequencing.  Pt negotiated stepping over canes w/ HW and CGA, w/ cueing for safe approach to maintain proper step lengths.  Pt amb x 20' into room and remained sitting in recliner w/ all needs in reach.     Therapy Documentation Precautions:  Precautions Precautions: Fall, Back, Cervical Required Braces or Orthoses: Cervical Brace Cervical Brace: Hard collar (c-collar to be donned in sitting, Lt UE sling used for comfort) Restrictions Weight Bearing Restrictions: No LUE Weight Bearing: Weight bearing as tolerated General:   Vital Signs:  Pain:0/10 Pain Assessment Pain Scale: 0-10 Pain Score: 0-No pain     Therapy/Group: Individual Therapy  Ladoris Gene 09/25/2021, 10:31 AM

## 2021-09-25 NOTE — Progress Notes (Signed)
Occupational Therapy Session Note  Patient Details  Name: Carlos [name]. MRN: [id number] Date of Birth: [birth year]  Today's Date: 09/25/2021 OT Individual Time: 7564-3329 OT Individual Time Calculation (min): 72 min    Short Term Goals: Week 2:  OT Short Term Goal 2 (Week 2): Pt will perform toileting tasks with mod A OT Short Term Goal 3 (Week 2): Pt will complete UB dressing tasks with min A OT Short Term Goal 4 (Week 2): Pt will complete LB dressing tasks with mod A  Skilled Therapeutic Interventions/Progress Updates:    Pt resting in bed upon arrival. BP 124/80 (94) supine without Teds or Ace wraps. Teds and ace wraps donned and pt sat EOB. Pt donned socks before transferring to w/c with CGA using HW. Pt completed grooming tasks at sink. OT intervention with focus on RUE strengthening and function. Pt used hardware FM board to increase functional use. Focus on control of 5th digit. Pt also engaged in in hand manipulation tasks with poker ships and coins. Pt used theraputty with beads and graded clothes pins with focus on technique, strengthening, and control. Pt returned to room and transferred to recliner. Pt remained in recliner awaiting PT. All needs within reach.   Therapy Documentation Precautions:  Precautions Precautions: Fall, Back, Cervical Required Braces or Orthoses: Cervical Brace Cervical Brace: Hard collar (c-collar to be donned in sitting, Lt UE sling used for comfort) Restrictions Weight Bearing Restrictions: No LUE Weight Bearing: Weight bearing as tolerated Pain: Pt reports 7/10 pain in LUE/scapula; repositioned   Therapy/Group: Individual Therapy  Leroy Libman 09/25/2021, 9:49 AM

## 2021-09-25 NOTE — Progress Notes (Signed)
Physical Therapy Session Note  Patient Details  Name: Carlos [name]. MRN: [id number] Date of Birth: February 22, 1971  Today's Date: 09/25/2021 PT Individual Time: 6256-3893 PT Individual Time Calculation (min): 31 min   Short Term Goals: Week 1:  PT Short Term Goal 1 (Week 1): Pt will ambulate x 50 ft with LRAD and min A consistently PT Short Term Goal 1 - Progress (Week 1): Met PT Short Term Goal 2 (Week 1): Pt will initiate stair training PT Short Term Goal 2 - Progress (Week 1): Progressing toward goal PT Short Term Goal 3 (Week 1): Pt will complete objective balance assessment to determine fall risk PT Short Term Goal 3 - Progress (Week 1): Progressing toward goal Week 2:  PT Short Term Goal 1 (Week 2): Pt will ambulate x 100 ft with LRAD and min A PT Short Term Goal 2 (Week 2): Pt will initiate stair training PT Short Term Goal 3 (Week 2): Pt will tolerate standing x 5 min during functional task without onset of OH  Skilled Therapeutic Interventions/Progress Updates:  Pt received seated in recliner in room, reported 3/10 pain in low back and was premedicated. Offered positional changes and modalities for pain relief. Pt donned cervical collar w/min A and shoes w/max A. Sit <>stand w/CGA and pt ambulated 10' to [facility] w/hemi-walker and min A. BP once seated: 117/79 mmHg. Pt transported to main gym w/total A for time management. Sit <>stand from [facility] w/CGA and pt ascended/descended 8 3" steps w/R handrail only and CGA using step-to pattern. Pt able to teach back proper stair technique to therapist prior to performing task. BP after doing stairs: 118/93 mmHg. After short rest break, sit <>stand from [facility] w/CGA and pt ambulated 50' w/hemi-walker and min A for midline orientation. Noted L trunk lean, step-to gait pattern and decreased step length and clearance of R>L. Pt transported back to room w/total A and performed sit <>stand pivot from WC to recliner w/CGA and hemi-walker. Pt was left seated in  recliner, declined heat pack for back pain, all needs in reach.   Therapy Documentation Precautions:  Precautions Precautions: Fall, Back, Cervical Required Braces or Orthoses: Cervical Brace Cervical Brace: Hard collar (c-collar to be donned in sitting, Lt UE sling used for comfort) Restrictions Weight Bearing Restrictions: No LUE Weight Bearing: Weight bearing as tolerated   Therapy/Group: Individual Therapy [name], PT, DPT  09/25/2021, 7:53 AM

## 2021-09-26 LAB — GLUCOSE, CAPILLARY
Glucose-Capillary: 114 mg/dL — ABNORMAL HIGH (ref 70–99)
Glucose-Capillary: 150 mg/dL — ABNORMAL HIGH (ref 70–99)
Glucose-Capillary: 171 mg/dL — ABNORMAL HIGH (ref 70–99)
Glucose-Capillary: 137 mg/dL — ABNORMAL HIGH (ref 70–99)

## 2021-09-26 MED ORDER — DICLOFENAC SODIUM 50 MG PO TBEC
50.0000 mg | DELAYED_RELEASE_TABLET | Freq: Three times a day (TID) | ORAL | Status: DC
  Administered 2021-09-26 – 2021-10-04 (×23): 50 mg via ORAL
  Filled 2021-09-26 (×26): qty 1

## 2021-09-26 NOTE — Progress Notes (Signed)
Physical Therapy Session Note  Patient Details  Name: Carlos [name]. MRN: [id number] Date of Birth: 1971-03-05  Today's Date: 09/26/2021 PT Individual Time: 1305-1420 PT Individual Time Calculation (min): 75 min   Short Term Goals: Week 2:  PT Short Term Goal 1 (Week 2): Pt will ambulate x 100 ft with LRAD and min A PT Short Term Goal 2 (Week 2): Pt will initiate stair training PT Short Term Goal 3 (Week 2): Pt will tolerate standing x 5 min during functional task without onset of OH  Skilled Therapeutic Interventions/Progress Updates: Pt presented in recliner with family present agreeable to therapy. Pt rates pain 6/10 rest breaks provided as needed. Pt ambulated to day room with MinA nearing CGA and HW ~175f. Pt noted to have decreased coordination with fatigue but no overt LOB noted. Pt then participated in obstacle course including weaving through cones and stepping over thresholds with CGA, cues to monitor pacing for improved coordination. Pt indicated continues to feel significant weakness in hips. Pt participated in standing hip abd/add at high/low table x 10 bilaterally with significant fatigue noted from pt. Pt then indicated possible need for BM. Transported pt back to room and [facility]. Pt was CGA for clothing management and toilet transfers (-BM). Pt returned to w/c in same manner as prior [facility] Pt performed some w/c mobility with BLE only for general strengthening. During rest breaks provided continued education on preparation for d/c. Pt indicated ramp was completed, clearing spaces, and placing dogs separate room on day of d/c to allow pt to settle into home. Pt then performed w/c mobility back to elevators. Pt transported remaining distance to room and performed ambulatory transfer to bed. Pt was supervision for sit to supine and was able to reposition self to comfort. Pt left in bed with call bell within reach  and family present.      Therapy Documentation Precautions:  Precautions Precautions: Fall, Back, Cervical Required Braces or Orthoses: Cervical Brace Cervical Brace: Hard collar (c-collar to be donned in sitting, Lt UE sling used for comfort) Restrictions Weight Bearing Restrictions: No LUE Weight Bearing: Weight bearing as tolerated General:   Vital Signs: Therapy Vitals Temp: 97.9 F (36.6 C) Temp Source: Oral Pulse Rate: 93 Resp: 16 BP: 113/80 Patient Position (if appropriate): Lying Oxygen Therapy SpO2: 98 % O2 Device: Room Air Pain: Pain Assessment Pain Scale: 0-10 Pain Score: 5  Pain Type: Acute pain Pain Location: Back Pain Descriptors / Indicators: Aching Pain Frequency: Constant Pain Onset: On-going Pain Intervention(s): Medication (See eMAR)   Therapy/Group: Individual Therapy  Binh Doten Harshika Mago, PTA  09/26/2021, 4:05 PM

## 2021-09-26 NOTE — Progress Notes (Signed)
Occupational Therapy Session Note  Patient Details  Name: Carlos [name]. MRN: [id number] Date of Birth: 1971/03/27  Today's Date: 09/26/2021 OT Individual Time: 0900-1000 OT Individual Time Calculation (min): 60 min    Short Term Goals: Week 3:  OT Short Term Goal 1 (Week 3): STG=LTG 2/2 ELOS (continue working towards supervision goals)  Skilled Therapeutic Interventions/Progress Updates:    OT intervention with focus on BADL retraining including bathing at shower level and dressing with sit<>stand, bed mobility, safety awareness, and activity tolerance to increase independence with BADLs. Supine>sit EOB with supervision. Pt did not attempt to use LUE to assist with bed mobility. Stand pivot transfers with CGA. Bathing at shower level with CGA when standing. Pt did not attempt to raise LUE above 90*. UB dressing with supervision including doffing/donning cervical collar. LB dressing with CGA when standing. Pt transferred to recliner and remained in recliner with all needs within reach.   Therapy Documentation Precautions:  Precautions Precautions: Fall, Back, Cervical Required Braces or Orthoses: Cervical Brace Cervical Brace: Hard collar (c-collar to be donned in sitting, Lt UE sling used for comfort) Restrictions Weight Bearing Restrictions: No LUE Weight Bearing: Weight bearing as tolerated   Pain: Pain Assessment Pain Scale: 0-10 Pain Score: 7  Pain Location: Back Pain Orientation: Mid Pain Radiating Towards: Left shoulder Pain Descriptors / Indicators: Aching Pain Frequency: Constant Pain Onset: On-going Pain Intervention(s): Warm shower and repositioned   Therapy/Group: Individual Therapy  Leroy Libman 09/26/2021, 10:03 AM

## 2021-09-26 NOTE — Progress Notes (Signed)
Patient ID: Carlos [name]., male   DOB: Sep 06, 1971, 50 y.o.   MRN: [id number] Follow up chest xray from yesterday reviewed, no signs of pneumothorax. Patient may follow up in trauma clinic PRN after discharge from CIR.  Wellington Hampshire, [facility] 09/26/2021, 2:14 PM Please see Amion for pager number during day hours 7:00am-4:30pm

## 2021-09-26 NOTE — Progress Notes (Signed)
Occupational Therapy Session Note  Patient Details  Name: Carlos [name]. MRN: [id number] Date of Birth: 1971/07/17  Today's Date: 09/26/2021 OT Individual Time: 6579-0383 OT Individual Time Calculation (min): 27 min    Short Term Goals: Week 1:  OT Short Term Goal 1 (Week 1): Pt will complete sit<stand during LB self care with no more than Min A OT Short Term Goal 1 - Progress (Week 1): Met OT Short Term Goal 2 (Week 1): Pt will complete UB dressing with Mod A OT Short Term Goal 2 - Progress (Week 1): Met OT Short Term Goal 3 (Week 1): Pt will complete 1/3 components of toileting with no more than Min balance assistance OT Short Term Goal 3 - Progress (Week 1): Met Week 2:  OT Short Term Goal 2 (Week 2): Pt will perform toileting tasks with mod A OT Short Term Goal 2 - Progress (Week 2): Met OT Short Term Goal 3 (Week 2): Pt will complete UB dressing tasks with min A OT Short Term Goal 3 - Progress (Week 2): Met OT Short Term Goal 4 (Week 2): Pt will complete LB dressing tasks with mod A OT Short Term Goal 4 - Progress (Week 2): Met Week 3:  OT Short Term Goal 1 (Week 3): STG=LTG 2/2 ELOS (continue working towards supervision goals)  Skilled Therapeutic Interventions/Progress Updates:    Pt greeted at time of session semireclined in bed no report of pain and agreeable to OT session. Supine > sit Supervision and donned C collar with Set up. Stand pivot CGA bed > wheelchair. Transported to gym for time conservation and focused on standing balance with toe taps on various colors for BLEs in single leg stance with pt having increased difficulty standing on RLE only to hit targets with LLE. Min A at times for dynamic balance and no AD support. Once brief episode of slight dizziness that improved with seated rest break. Stand pivot wheelchair <> mat CGA. Back in room, stand pivot to bed same as above, alarm on call bell in reach.   Therapy Documentation Precautions:   Precautions Precautions: Fall, Back, Cervical Required Braces or Orthoses: Cervical Brace Cervical Brace: Hard collar (c-collar to be donned in sitting, Lt UE sling used for comfort) Restrictions Weight Bearing Restrictions: No LUE Weight Bearing: Weight bearing as tolerated     Therapy/Group: Individual Therapy  Viona Gilmore 09/26/2021, 7:24 AM
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
## 2021-09-26 NOTE — Progress Notes (Signed)
Occupational Therapy Weekly Progress Note  Patient Details  Name: Carlos [name]. MRN: [id number] Date of Birth: 1971/09/10  Beginning of progress report period: September 20, 2021 End of progress report period: September 26, 2021  Patient has met 3 of 3 short term goals.  Pt is making steady progress with BADLs and functional transfers. Pt continues to require assistance donning LUE sling and cervical collar but otherwise completes UB dressing with supervision. Pt requires CGA for LB dressing with sit<>stand from EOB. Pt dependent for donning Ted hose and BLE ACE wraps. Functional transfers with CGA. Bathing at shower level with min A. LTG upgraded to supervision. Pt with periodic episodes of OH and symptomatic. Family education scheduled for next week.  Patient continues to demonstrate the following deficits: muscle weakness, decreased cardiorespiratoy endurance, impaired timing and sequencing, unbalanced muscle activation, decreased coordination, and decreased motor planning, and decreased sitting balance, decreased standing balance, and decreased postural control and therefore will continue to benefit from skilled OT intervention to enhance overall performance with BADL, iADL, and Reduce care partner burden.  Patient progressing toward long term goals..  Continue plan of care.  OT Short Term Goals Week 2:  OT Short Term Goal 2 (Week 2): Pt will perform toileting tasks with mod A OT Short Term Goal 2 - Progress (Week 2): Met OT Short Term Goal 3 (Week 2): Pt will complete UB dressing tasks with min A OT Short Term Goal 3 - Progress (Week 2): Met OT Short Term Goal 4 (Week 2): Pt will complete LB dressing tasks with mod A OT Short Term Goal 4 - Progress (Week 2): Met Week 3:  OT Short Term Goal 1 (Week 3): STG=LTG 2/2 ELOS (continue working towards supervision goals)    Leroy Libman 09/26/2021, 6:53 AM

## 2021-09-26 NOTE — Progress Notes (Signed)
Patient ID: Carlos Frieze., male   DOB: [birth year], 50 y.o.   MRN: [id number]  SW provided pt handicap placard. Pt reported his wife did not remember the times for family edu. He states someone will follow-up with SW about date/time.  *SW returned phone call to pt wife Carlos [name] [phone]) who confirms family edu on Friday (10/14) 9am-12pm including his parents.   Loralee Pacas, MSW, [place] Office: [phone] Cell: [phone] Fax: [phone]

## 2021-09-26 NOTE — Progress Notes (Signed)
Physical Therapy Session Note  Patient Details  Name: Carlos [name]. MRN: [id number] Date of Birth: 1971-10-23  Today's Date: 09/26/2021 PT Individual Time: 1015-1100 PT Individual Time Calculation (min): 45 min   Short Term Goals: Week 2:  PT Short Term Goal 1 (Week 2): Pt will ambulate x 100 ft with LRAD and min A PT Short Term Goal 2 (Week 2): Pt will initiate stair training PT Short Term Goal 3 (Week 2): Pt will tolerate standing x 5 min during functional task without onset of OH  Skilled Therapeutic Interventions/Progress Updates:     Pt seated in recliner to start session - agreeable to PT tx and no reports of pain. Donned thigh-high TED's and ace wrapped BLE's with totalA at start of session. Donned slip-on shoes with maxA for time. Sit<>stand to [facility] with CGA and ambulates short distances in room with minA and HW to w/c. Transported in w/c to main rehab gym for time. Spent remainder of session on BLE coordination, strengthening, and standing balance. Completed alternating toe taps to cones with HW support and CGA for balance - worked on completing without HW support and required minA for balance due to unsteadiness and LOBs. Instructed on lateral step up's with LLE leading to [place] with HW - requiring minA for powering and steadying. Also completed lateral toe taps with RLE leading to 4inch platform with CGA for balance - deferred full step ups due to RLE weakness and fatigue. Assisted back to his w/c via stand<>pivot transfer with minA and no AD and returned to his room where he completed short distance gait with minA and HW. Remained seated in recliner with all needs in reach, pillow supporting LUE.  *Did not assess vitals but pt was asymptomatic for orthostasis during treatment session.     Therapy Documentation Precautions:  Precautions Precautions: Fall, Back, Cervical Required Braces or Orthoses: Cervical Brace Cervical Brace: Hard collar (c-collar to be donned in  sitting, Lt UE sling used for comfort) Restrictions Weight Bearing Restrictions: No LUE Weight Bearing: Weight bearing as tolerated General:    Therapy/Group: Individual Therapy  Alger Simons 09/26/2021, 10:56 AM

## 2021-09-27 LAB — GLUCOSE, CAPILLARY
Glucose-Capillary: 223 mg/dL — ABNORMAL HIGH (ref 70–99)
Glucose-Capillary: 116 mg/dL — ABNORMAL HIGH (ref 70–99)
Glucose-Capillary: 196 mg/dL — ABNORMAL HIGH (ref 70–99)
Glucose-Capillary: 245 mg/dL — ABNORMAL HIGH (ref 70–99)

## 2021-09-27 MED ORDER — FLUDROCORTISONE ACETATE 0.1 MG PO TABS
0.2000 mg | ORAL_TABLET | Freq: Every day | ORAL | Status: DC
  Administered 2021-09-28 – 2021-10-04 (×7): 0.2 mg via ORAL
  Filled 2021-09-27 (×7): qty 2

## 2021-09-27 MED ORDER — FLUDROCORTISONE ACETATE 0.1 MG PO TABS
0.1000 mg | ORAL_TABLET | Freq: Once | ORAL | Status: AC
  Administered 2021-09-27: 0.1 mg via ORAL
  Filled 2021-09-27: qty 1

## 2021-09-27 NOTE — Progress Notes (Signed)
Physical Therapy Session Note  Patient Details  Name: Varian Innes. MRN: [id number] Date of Birth: 07-26-1971  Today's Date: 09/27/2021      Short Term Goals: Week 2:  PT Short Term Goal 1 (Week 2): Pt will ambulate x 100 ft with LRAD and min A PT Short Term Goal 2 (Week 2): Pt will initiate stair training PT Short Term Goal 3 (Week 2): Pt will tolerate standing x 5 min during functional task without onset of OH  Skilled Therapeutic Interventions/Progress Updates: Pt with episode of OH in previous session (approx 45 min prior). Pt continues to feel lightheaded and nauseated BP checked by NT few minutes prior 113/77. Pt refusing any additional intervention at this time. Will continue efforts. PT missed 60 min skilled PT due to [facility].      Therapy Documentation Precautions:  Precautions Precautions: Fall, Back, Cervical Required Braces or Orthoses: Cervical Brace Cervical Brace: Hard collar (c-collar to be donned in sitting, Lt UE sling used for comfort) Restrictions Weight Bearing Restrictions: No LUE Weight Bearing: Weight bearing as tolerated General:   Vital Signs: Therapy Vitals Temp: 97.7 F (36.5 C) Pulse Rate: 84 Resp: 18 BP: 113/77 Patient Position (if appropriate): Lying Oxygen Therapy SpO2: 100 % O2 Device: Room Air Therapy/Group: Individual Therapy  Stefannie Defeo Arhianna Ebey, PTA  09/27/2021, 7:55 AM

## 2021-09-27 NOTE — Progress Notes (Signed)
Physical Therapy Session Note  Patient Details  Name: Carlos [name]. MRN: [id number] Date of Birth: 11-Oct-1971  Today's Date: 09/27/2021 PT Individual Time: 1443-1540 and 1300-1315 PT Individual Time Calculation (min): 51 min and 15 min. Missed time: 24 min in AM, 30' in PM. Short Term Goals: Week 2:  PT Short Term Goal 1 (Week 2): Pt will ambulate x 100 ft with LRAD and min A PT Short Term Goal 2 (Week 2): Pt will initiate stair training PT Short Term Goal 3 (Week 2): Pt will tolerate standing x 5 min during functional task without onset of OH  Skilled Therapeutic Interventions/Progress Updates:   First session:  Pt presents supine in bed and agreeable to therapy, just finishing breakfast which was late.  PT donned thigh high TEDs to lower legs and pt able to complete donning after crossing knees, and then ace wraps donned w/ total A as pt lifts Les.  Pt transfers sup to sit w/ supervision.  Pt able to don slip-on shoes w/ supervision.  Pt transferred sit to stand w/ CGA to [facility].  Pt amb x 120' w/ HW and Min A to CGA, verbal cues for correct placement of HW when negotiating around obstacles to avoid lifting over obstacle or placing in front of own feet.  Pt performed multiple sit to stand transfers w/ CGA.  Pt performed partial squats 3 x 10 w/ min to CGA.  Pt then c/o dizziness and nausea.  BP taken in sitting at 105/73 and given rest break before re-taking at 98/69 and returned to room.  Pt transferred w/c > bed w/ min A and sit to supine w/ CGA.  BP taken after head lowered and LEs elevated at 97/73.  Nursing was notified and assessed, pt requesting med for nausea.  Pt left in bed for monitoring.  Bed alarm on and all needs in reach. Pt missed 24' of therapy time.  Second session:  Pt presents supine in bed and still c/o dizziness and nausea.  Pt BP assessed in supine at 106/67 and then transferred supine to sit w/ supervision and BP re-assessed at 82/64.  Pt returned to supine and BP  elevated to 111/74 but still c/o symptoms in supine position w/ head lowered and LEs elevated.  Nursing notified of PT assessment and will update MD.  Bed alarm on and all needs in reach.  Total missed time of 30 min.       Therapy Documentation Precautions:  Precautions Precautions: Fall, Back, Cervical Required Braces or Orthoses: Cervical Brace Cervical Brace: Hard collar (c-collar to be donned in sitting, Lt UE sling used for comfort) Restrictions Weight Bearing Restrictions: No LUE Weight Bearing: Weight bearing as tolerated General: PT Amount of Missed Time (min): 24 Minutes PT Missed Treatment Reason: Patient ill (Comment) (nauseated and decreased BP.) Vital Signs: Therapy Vitals Temp: 97.7 F (36.5 C) Pulse Rate: 84 Resp: 18 BP: 113/77 Patient Position (if appropriate): Lying Oxygen Therapy SpO2: 100 % O2 Device: Room Air Pain: Pain Assessment Pain Scale: 0-10 Pain Score: 5  Pain Type: Acute pain Pain Location: Back Pain Orientation: Mid Pain Descriptors / Indicators: Aching Pain Frequency: Constant Pain Onset: On-going Pain Intervention(s): Medication (See eMAR) Mobility:       Therapy/Group: Individual Therapy  Ladoris Gene 09/27/2021, 10:25 AM
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
## 2021-09-27 NOTE — Progress Notes (Signed)
Recreational Therapy Session Note  Patient Details  Name: Carlos [name]. MRN: [id number] Date of Birth: 17-Sep-1971 Today's Date: 09/27/2021  Pain: c/o HA & nausea, RN made aware Skilled Therapeutic Interventions/Progress Updates: Pt with c/o of not feeling well this afternoon and unable to participate in scheduled therapy session. [place] 09/27/2021, 9:37 AM

## 2021-09-27 NOTE — Progress Notes (Signed)
Pt reports that he was "exercising left arm' and felt increased pain to left shoulder. Assessed pt's left blade and did not assess any disfigurement to left shoulder blade compared to right shoulder blade. Pt states that the pain to left shoulder has decreased with rest. Instructed pt to no do any exercises unless PT has instructed on those exercises. Pt agrees.

## 2021-09-27 NOTE — Progress Notes (Signed)
Orthopedic Tech Progress Note Patient Details:  Carlos [name]. 02-14-1971 [id number]  Ortho Devices Type of Ortho Device: Abdominal binder Ortho Device/Splint Interventions: Ordered      Danton Sewer A Levia Waltermire 09/27/2021, 4:02 PM

## 2021-09-28 LAB — GLUCOSE, CAPILLARY
Glucose-Capillary: 135 mg/dL — ABNORMAL HIGH (ref 70–99)
Glucose-Capillary: 167 mg/dL — ABNORMAL HIGH (ref 70–99)
Glucose-Capillary: 147 mg/dL — ABNORMAL HIGH (ref 70–99)
Glucose-Capillary: 172 mg/dL — ABNORMAL HIGH (ref 70–99)

## 2021-09-28 MED ORDER — GLIPIZIDE 5 MG PO TABS
5.0000 mg | ORAL_TABLET | Freq: Every day | ORAL | Status: DC
  Administered 2021-09-29 – 2021-10-04 (×6): 5 mg via ORAL
  Filled 2021-09-28 (×6): qty 1

## 2021-09-28 NOTE — Progress Notes (Signed)
Physical Therapy Session Note  Patient Details  Name: Carlos [name]. MRN: [id number] Date of Birth: 11/24/1971  Today's Date: 09/28/2021 PT Individual Time: 0900-1000 PT Individual Time Calculation (min): 60 min   Short Term Goals: Week 2:  PT Short Term Goal 1 (Week 2): Pt will ambulate x 100 ft with LRAD and min A PT Short Term Goal 2 (Week 2): Pt will initiate stair training PT Short Term Goal 3 (Week 2): Pt will tolerate standing x 5 min during functional task without onset of OH  Skilled Therapeutic Interventions/Progress Updates: Pt presented in bed with family present. Session focused on family education. PTA discussed pt's current status and that he continues to have intermittent orthostatic hypotension which may limit function. Discussed may need to use TED hose/compression socks upon d/c. Discussed that currently goals may be may be upgraded due to pt's progress but would be most likely CGA for gait with supervision for transfers. Explained throughout session differences and demonstrated with family verbalizing understanding. PTA donned TED hose and ace bandages total A with pt then performing bed mobility with supervision. BP checked 114/80 (90) HR 92. Pt then ambulated to day room with [facility] with minA nearing CGA and no increased lateral sway. PTA also explained that with fatigue pt demonstrates more ataxic qualities however family pleased with progress. Pt then transported to ortho gym and pt was able to perform car transfer with HW to SUV height  with CGA. Pt also ambulated up/down ramp and ~70f on mulch with minA. Cues provided to pt particularly on mulch for safety. BP checked after activities 115/86 (95) HR 82. Pt then ambulated with wife with PTA providing cues on holding gait belt and to maintain close proximity to pt for guarding. Pt then began ambulating at higher pace with LOB occurring to L with wife and PTA assisting for correction. Dicussed situation and pt agreed that began  ambulating too fast. PTA then answered any additional queries from family. Family returned to room and pt transported to rehab gym. Pt participated in sidestepping 853fx 4, then with red theraband at knees [id number] 4. Pt also performed mini squats in parallel bars 2 x 10. Pt transported back to room and remained in w/c per pt request. Pt left with family present and current needs met.      Therapy Documentation Precautions:  Precautions Precautions: Fall, Back, Cervical Required Braces or Orthoses: Cervical Brace Cervical Brace: Hard collar (c-collar to be donned in sitting, Lt UE sling used for comfort) Restrictions Weight Bearing Restrictions: No LUE Weight Bearing: Non weight bearing General:   Vital Signs: Therapy Vitals Temp: 98.3 F (36.8 C) Temp Source: Oral Pulse Rate: 79 Resp: 18 BP: (!) 123/92 Patient Position (if appropriate): Lying Oxygen Therapy SpO2: 100 % O2 Device: Room Air Pain: Pain Assessment Pain Score: 5  Mobility:   Locomotion :    Trunk/Postural Assessment :    Balance:   Exercises:   Other Treatments:      Therapy/Group: Individual Therapy  Emaan Gary 09/28/2021, 4:37 PM
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
## 2021-09-28 NOTE — Progress Notes (Signed)
Physical Therapy Session Note  Patient Details  Name: Carlos [name]. MRN: [id number] Date of Birth: December 07, 1971  Today's Date: 09/28/2021 PT Individual Time: 2023-3435 PT Individual Time Calculation (min): 54 min   Short Term Goals: Week 1:  PT Short Term Goal 1 (Week 1): Pt will ambulate x 50 ft with LRAD and min A consistently PT Short Term Goal 1 - Progress (Week 1): Met PT Short Term Goal 2 (Week 1): Pt will initiate stair training PT Short Term Goal 2 - Progress (Week 1): Progressing toward goal PT Short Term Goal 3 (Week 1): Pt will complete objective balance assessment to determine fall risk PT Short Term Goal 3 - Progress (Week 1): Progressing toward goal Week 2:  PT Short Term Goal 1 (Week 2): Pt will ambulate x 100 ft with LRAD and min A PT Short Term Goal 2 (Week 2): Pt will initiate stair training PT Short Term Goal 3 (Week 2): Pt will tolerate standing x 5 min during functional task without onset of OH  Skilled Therapeutic Interventions/Progress Updates:   Received pt supine in bed, pt agreeable to PT treatment, and denied any pain during session. Session with emphasis on functional mobility/transfers, generalized strengthening, dynamic standing balance/coordination, gait training, and improved endurance to activity. Pt performed bed mobility with HOB elevated and use of bedrails with supervision and donned cervical collar with set up assist. Stand<>pivot bed<>WC with hemi walker with CGA. Thigh high ted hose and ace wraps applied to BLEs. Pt transported to/from room in [facility] total A for time management purposes. Pt reuqested to trial quad cane and ambulated 44f x 2 and 854fx 1 with quad cane and min A overall. Pt demonstrated ataxic gait pattern with unequal steps/strides, and generalized unsteadiness but reported liking the quad cane > hemi walker as it felt "lighter" and didn't get caught in his foot. Worked on blocked practice sit<>stands with 1 UE support x10 reps and CGA to  fatigue with emphasis on quad strength. Ambulated 1046fith quad cane and CGA to Nustep and performed BLE strengthening on Nustep at workload 5 for 10 minutes for a total of 398 steps with emphasis on cardiovascular endurance and hip strengthening - pt reported fatigue after activity. Stand<>pivot Nustep<>WC with quad cane and CGA and WC<>bed with quad cane and CGA. Sit<>supine with supervision and removed ace wraps and ted hose with max A. 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 Concluded session with pt semi-recllined in bed, needs within reach, and bed alarm on.   Therapy Documentation Precautions:  Precautions Precautions: Fall, Back, Cervical Required Braces or Orthoses: Cervical Brace Cervical Brace: Hard collar (c-collar to be donned in sitting, Lt UE sling used for comfort) Restrictions Weight Bearing Restrictions: No LUE Weight Bearing: Non weight bearing  Therapy/Group: Individual Therapy AnnAlfonse Alpers, DPT   09/28/2021, 7:39 AM

## 2021-09-28 NOTE — Progress Notes (Signed)
Occupational Therapy Session Note  Patient Details  Name: Carlos [name]. MRN: [id number] Date of Birth: 02-25-1971  Today's Date: 09/29/2021 OT Individual Time: 1450-1530 OT Individual Time Calculation (min): 40 min    Short Term Goals: Week 3:  OT Short Term Goal 1 (Week 3): STG=LTG 2/2 ELOS (continue working towards supervision goals)  Skilled Therapeutic Interventions/Progress Updates:    Pt greeted in bed with no c/o pain, ready to go. Reviewed how to change out c-collar pads and c-collar hygiene with spouse. Noted that pt does not have an extra pair of c-collar pads in the room. Found replacement pads for [place] J collar on unit but do not fit well. Advised wife to f/u with staff on Monday. Pt donned the c-collar himself with min cues. He wanted to work on his balance/walking skills during tx and also wanted to use the SPC vs hemi walker. Pt used the [facility] with heavy Min A to ambulate to the dayroom where he engaged in a graded obstacle course activity. Worked on forward/backward stepping, stepping up/down slight inclines, stepping up on foam pad and turning, and weaving around cones. Pt required Min-Mod A for balance but once in a while his legs would scissor due to ataxia and he'd require Mod A to prevent loss of balance. He reported having more difficultly controlling his Rt leg compared to therapy session this morning. Wife afterwards provided ambulation assistance on the way back to his room. Pt transferred back to bed and was left with all needs within reach and family present.   Therapy Documentation Precautions:  Precautions Precautions: Fall, Back, Cervical Required Braces or Orthoses: Cervical Brace Cervical Brace: Hard collar (c-collar to be donned in sitting, Lt UE sling used for comfort) Restrictions Weight Bearing Restrictions: No LUE Weight Bearing: Non weight bearing Pain: no c/o pain during tx   ADL: ADL Eating: Supervision/safety (using built up utensil) Where  Assessed-Eating: Bed level Grooming: Not assessed Upper Body Bathing: Not assessed Lower Body Bathing: Not assessed Upper Body Dressing: Maximal assistance Where Assessed-Upper Body Dressing: Edge of bed Lower Body Dressing: Dependent Where Assessed-Lower Body Dressing: Edge of bed Toileting: Not assessed Toilet Transfer: Not assessed Tub/Shower Transfer: Not assessed    Therapy/Group: Individual Therapy  Lisel Siegrist A Yameli Delamater 09/29/2021, 4:02 PM

## 2021-09-28 NOTE — Progress Notes (Signed)
Occupational Therapy Session Note  Patient Details  Name: Carlos [name]. MRN: [id number] Date of Birth: 07/17/1971  Today's Date: 09/28/2021 OT Individual Time: 1004-1100 OT Individual Time Calculation (min): 56 min    Short Term Goals: Week 3:  OT Short Term Goal 1 (Week 3): STG=LTG 2/2 ELOS (continue working towards supervision goals)  Skilled Therapeutic Interventions/Progress Updates:  Patient met seated in wc in agreement with OT treatment session. 6/10 pain reported at rest and with activity in low back. Mother, wife and father present at bedside for education session. Provided patient/family education on safety with ADLs and need for supervision/Min guard assist with ADL transfers initially upon return home. Patient reports having walk-in shower with built-in seat and [place]. Family with plan to install grab bars in shower in prep for bathing. Patient and family taken to ADL apartment to see placement of grab bars. Wife able to take pictures on cell phone for reference. Patient reports continued difficulty managing screw caps on bottles. Dycem given to maximize independence and decrease need for assistance. Patient reports desire to attend sporting events and activities in the community upon return home. Discussion on toileting in situations that may not be as accessible. Education on use of urinal at night and out in the community if patient unable to locate accessible bathroom. Patient also with questions on wc needs. Information relayed to PT. Last 15 minutes of session with focus on BLE HEP with use of 1lb ankle weight for marches (2 sets x10 reps each). Session concluded with patient seated in wc with call bell within reach and all needs met.   Therapy Documentation Precautions:  Precautions Precautions: Fall, Back, Cervical Required Braces or Orthoses: Cervical Brace Cervical Brace: Hard collar (c-collar to be donned in sitting, Lt UE sling used for comfort) Restrictions Weight  Bearing Restrictions: No LUE Weight Bearing: Non weight bearing General:   Therapy/Group: Individual Therapy  Rai Severns R Howerton-Davis 09/28/2021, 6:47 AM

## 2021-09-29 DIAGNOSIS — D62 Acute posthemorrhagic anemia: Secondary | ICD-10-CM

## 2021-09-29 DIAGNOSIS — E1165 Type 2 diabetes mellitus with hyperglycemia: Secondary | ICD-10-CM

## 2021-09-29 DIAGNOSIS — E871 Hypo-osmolality and hyponatremia: Secondary | ICD-10-CM

## 2021-09-29 LAB — GLUCOSE, CAPILLARY
Glucose-Capillary: 121 mg/dL — ABNORMAL HIGH (ref 70–99)
Glucose-Capillary: 151 mg/dL — ABNORMAL HIGH (ref 70–99)
Glucose-Capillary: 112 mg/dL — ABNORMAL HIGH (ref 70–99)
Glucose-Capillary: 142 mg/dL — ABNORMAL HIGH (ref 70–99)
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
## 2021-09-29 NOTE — Progress Notes (Signed)
Physical Therapy Session Note  Patient Details  Name: Carlos [name]. MRN: [id number] Date of Birth: [birth year]  Today's Date: 09/29/2021 PT Individual Time: 0905-1000 PT Individual Time Calculation (min): 55 min   Short Term Goals:  Week 2:  PT Short Term Goal 1 (Week 2): Pt will ambulate x 100 ft with LRAD and min A PT Short Term Goal 2 (Week 2): Pt will initiate stair training PT Short Term Goal 3 (Week 2): Pt will tolerate standing x 5 min during functional task without onset of OH   Skilled Therapeutic Interventions/Progress Updates:  Session 1  Pt received supine in bed and agreeable to PT. PT applied thigh high teds and Ace wrap to BLE. Pt applied C-collar. Supine>sit transfer without assist.   Stand pivot transfer to [facility] with CGA throughout session with UE supported on [facility] or Arm rest.   Gait training with QC and then with SPC x 67f with each and then +267fwith SPC. Min-CGA with cues for reciprocal gait pattern from PT.   Nustep BLE only 5 min + 3 min with cues for full ROM on level 7. Pt repots RPE 12/20 then 13/20.   Standing balance/tolerance while engaged in fine/gross motor control of wii sports bolwing. Pt able to maintain standing balance for 9 frames with min assist-CGA for safety and balance with intermittent RUE support on QC    Pt returned to room and performed stand pivot transfer to bed with CGA. Sit>supine completed with supervision assist and left supine in bed with call bell in reach and all needs met.   Session 2.  Pt received supine in bed and agreeable to PT. Supine>sit transfer without assist or cues. PT applied c-collar in sitting EOB. [facility] and no AD. Pt transported to entrance of [facility] training over cement sidewalk with QC x [id number] CGA-min assist with multiple nead LOB requiring assist to prevent R LOB. Cues for AD management with reciprocal pattern intermittently.  Pt performed 5 time sit<>stand  (5xSTS): 21 sec after 3 trials with no UE support  (>15 sec indicates increased fall risk)  Pt returned to room and performed stand pivot transfer to bed with no AD and CGA. Sit>supine completed without assist, and left supine in bed with call bell in reach and all needs met.       Therapy Documentation Precautions:  Precautions Precautions: Fall, Back, Cervical Required Braces or Orthoses: Cervical Brace Cervical Brace: Hard collar (c-collar to be donned in sitting, Lt UE sling used for comfort) Restrictions Weight Bearing Restrictions: No LUE Weight Bearing: Non weight bearing  Pain: Session 1 Pain Assessment Pain Scale: 0-10 Pain Score: 6  Pain Type: Acute pain;Surgical pain Pain Location: Back Pain Orientation: Mid;Lower Pain Radiating Towards: left upper back Pain Descriptors / Indicators: Aching;Discomfort Pain Frequency: Constant Pain Onset: On-going Pain Intervention(s): Medication (See eMAR) Session 2.  Denies pain   Therapy/Group: Individual Therapy  AustiLorie Phenix5/2022, 10:07 AM

## 2021-09-29 NOTE — Progress Notes (Signed)
Patient requests his 6 and 7am medications be given with his 8am medications. Will pass along to day shift nurse. Patient asleep in bed with call bell within reach. Will continue with plan of care.

## 2021-09-30 LAB — GLUCOSE, CAPILLARY
Glucose-Capillary: 136 mg/dL — ABNORMAL HIGH (ref 70–99)
Glucose-Capillary: 64 mg/dL — ABNORMAL LOW (ref 70–99)
Glucose-Capillary: 66 mg/dL — ABNORMAL LOW (ref 70–99)
Glucose-Capillary: 135 mg/dL — ABNORMAL HIGH (ref 70–99)
Glucose-Capillary: 171 mg/dL — ABNORMAL HIGH (ref 70–99)
Glucose-Capillary: 104 mg/dL — ABNORMAL HIGH (ref 70–99)

## 2021-09-30 NOTE — Significant Event (Signed)
Hypoglycemic Event  CBG: 64  Treatment: 8 oz juice/soda  Symptoms: None  Follow-up CBG: Time:1205 CBG Result:66  Possible Reasons for Event: Inadequate meal intake and Medication regimen:    Comments/MD notified:Protocol followed, 1302 CBG [place]

## 2021-09-30 NOTE — Progress Notes (Signed)
Pt alert and oriented. Last BM recorded was the 13th which pt confirms is correct. Pt refused the scheduled Miralax, offered PRN Sorbitol which also was refused. Educated on the importance of having regular Bms with pain medication. He verbalized an understanding and still refused anything other than colace for bowels.

## 2021-10-01 LAB — BASIC METABOLIC PANEL
Anion gap: 9 (ref 5–15)
BUN: 22 mg/dL — ABNORMAL HIGH (ref 6–20)
CO2: 30 mmol/L (ref 22–32)
Calcium: 9.3 mg/dL (ref 8.9–10.3)
Chloride: 97 mmol/L — ABNORMAL LOW (ref 98–111)
Creatinine, Ser: 0.74 mg/dL (ref 0.61–1.24)
GFR, Estimated: >60 mL/min (ref >60)
Glucose, Bld: 176 mg/dL — ABNORMAL HIGH (ref 70–99)
Potassium: 4.5 mmol/L (ref 3.5–5.1)
Sodium: 136 mmol/L (ref 135–145)

## 2021-10-01 LAB — CBC WITH DIFFERENTIAL/PLATELET
Abs Immature Granulocytes: 0.03 10*3/uL (ref 0.00–0.07)
Basophils Absolute: 0 10*3/uL (ref 0.0–0.1)
Basophils Relative: 1 %
Eosinophils Absolute: 0.5 10*3/uL (ref 0.0–0.5)
Eosinophils Relative: 8 %
HCT: 35.2 % — ABNORMAL LOW (ref 39.0–52.0)
Hemoglobin: 11.4 g/dL — ABNORMAL LOW (ref 13.0–17.0)
Immature Granulocytes: 1 %
Lymphocytes Relative: 20 %
Lymphs Abs: 1.2 10*3/uL (ref 0.7–4.0)
MCH: 30.9 pg (ref 26.0–34.0)
MCHC: 32.4 g/dL (ref 30.0–36.0)
MCV: 95.4 fL (ref 80.0–100.0)
Monocytes Absolute: 0.5 10*3/uL (ref 0.1–1.0)
Monocytes Relative: 8 %
Neutro Abs: 3.8 10*3/uL (ref 1.7–7.7)
Neutrophils Relative %: 62 %
Platelets: 285 10*3/uL (ref 150–400)
RBC: 3.69 MIL/uL — ABNORMAL LOW (ref 4.22–5.81)
RDW: 11.9 % (ref 11.5–15.5)
WBC: 6 10*3/uL (ref 4.0–10.5)
nRBC: 0 % (ref 0.0–0.2)

## 2021-10-01 LAB — GLUCOSE, CAPILLARY
Glucose-Capillary: 138 mg/dL — ABNORMAL HIGH (ref 70–99)
Glucose-Capillary: 164 mg/dL — ABNORMAL HIGH (ref 70–99)
Glucose-Capillary: 118 mg/dL — ABNORMAL HIGH (ref 70–99)
Glucose-Capillary: 180 mg/dL — ABNORMAL HIGH (ref 70–99)

## 2021-10-01 MED ORDER — DICLOFENAC SODIUM 1 % EX GEL
2.0000 g | Freq: Four times a day (QID) | CUTANEOUS | Status: DC
  Administered 2021-10-01 – 2021-10-04 (×9): 2 g via TOPICAL
  Filled 2021-10-01: qty 100

## 2021-10-01 MED ORDER — TRAMADOL HCL 50 MG PO TABS
50.0000 mg | ORAL_TABLET | Freq: Four times a day (QID) | ORAL | Status: DC | PRN
  Administered 2021-10-02 – 2021-10-03 (×2): 50 mg via ORAL
  Filled 2021-10-01 (×2): qty 1
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
## 2021-10-01 NOTE — Progress Notes (Signed)
Pain managed with scheduled pain meds and PRN oxy. IR. Mostly complains of back pain. BM yesterday. Declined scheduled senna s and miralax. Voids using urinal. Carlos [name] A

## 2021-10-01 NOTE — Progress Notes (Signed)
Physical Therapy Session Note  Patient Details  Name: Carlos [name]. MRN: [id number] Date of Birth: [birth year]  Today's Date: 10/01/2021 PT Individual Time: 1000-1100 PT Individual Time Calculation (min): 60 min   Short Term Goals: Week 2:  PT Short Term Goal 1 (Week 2): Pt will ambulate x 100 ft with LRAD and min A PT Short Term Goal 2 (Week 2): Pt will initiate stair training PT Short Term Goal 3 (Week 2): Pt will tolerate standing x 5 min during functional task without onset of OH  Skilled Therapeutic Interventions/Progress Updates:    Pt received supine in bed, agreeable to PT session. No complaints of pain this date. Per OTA pt was orthostatic during AM session after extended period of standing. Pt wearing THT in bed, supine BP 110/79. Supine to sit with Supervision. Sit to stand with CGA and SPC throughout sessoin. Ambulation 2 x 150 ft with SPC and min A for balance, decreased ataxia noted but ongoing RLE scissoring and decreased hip and knee flexion. Standing alt L/R 6" step-ups with R handrail and min A for balance for LE strengthening and coordination. Pt has onset of dizziness in standing. Seated BP 92/68. Seated BLE strengthening therex with 3# ankle weights: marches 2 x 10 reps. Pt exhibits no improvement in BP following seated therex. Assisted pt with donning BLE ACE wraps and encouraged to wear ACE wraps and TEDs at home due to sudden onset of OH. Seated BP 108/78 with TEDs and ACE wrap. Supine to/from sit on real bed in therapy apartment at Supervision level. Discussed equipment PT recommending for pt including SPC, 16x18 w/c with back cushion if available and updated CSW on pt's equipment needs. Pt returned to bed at end of session, needs in reach, bed alarm in place.  Therapy Documentation Precautions:  Precautions Precautions: Fall, Back, Cervical Required Braces or Orthoses: Cervical Brace Cervical Brace: Hard collar (c-collar to be donned in sitting, Lt UE sling used  for comfort) Restrictions Weight Bearing Restrictions: No LUE Weight Bearing: Non weight bearing     Therapy/Group: Individual Therapy   Excell Seltzer, PT, DPT, CSRS  10/01/2021, 5:28 PM

## 2021-10-01 NOTE — Progress Notes (Signed)
Patient refusing tramadol as he does not believe it's effective and prefers to use oxycodone. Will change tramadol to prn.

## 2021-10-01 NOTE — Progress Notes (Signed)
Occupational Therapy Session Note  Patient Details  Name: Carlos [name]. MRN: [id number] Date of Birth: 03/03/1971  Today's Date: 10/01/2021 OT Individual Time: 7543-6067 OT Individual Time Calculation (min): 56 min    Short Term Goals: Week 3:  OT Short Term Goal 1 (Week 3): STG=LTG 2/2 ELOS (continue working towards supervision goals)  Skilled Therapeutic Interventions/Progress Updates:    Pt resting in bed upon arrival and agreeable to getting OOB. BP seated without Teds or ACE wraps-115/78. SPT to w/c with SPC with CGA. Pt initially engaged in standing activity for game of corn hole with CGA. Pt remained standing to clean bean bags with supervision. Pt asymptomatic. Pt transitioned to gym and initially engaged in standing activity with heavy bag and boxing glove on RUE. Pt reported that he was "seeing spots" but felt ok. Pt returned to supine on mat-BP 98/72. Pt sat up to EOM, following 5 mins rest. BP 88/65. Pt returned to room and transferred to bed. Ted Hose donned at bed level. Pt remained in bed with all needs within reach and bed alarm activated.   Therapy Documentation Precautions:  Precautions Precautions: Fall, Back, Cervical Required Braces or Orthoses: Cervical Brace Cervical Brace: Hard collar (c-collar to be donned in sitting, Lt UE sling used for comfort) Restrictions Weight Bearing Restrictions: No LUE Weight Bearing: Non weight bearing Pain: Pain Assessment Pain Scale: 0-10 Pain Score: 5  Pain Type: Acute pain Pain Location: Shoulder Pain Orientation: Left;Upper Pain Descriptors / Indicators: Aching;Sore Pain Frequency: Constant Pain Onset: On-going Patients Stated Pain Goal: 4 Pain Intervention(s): repositioned and emotional support      Therapy/Group: Individual Therapy  Leroy Libman 10/01/2021, 9:04 AM

## 2021-10-01 NOTE — Plan of Care (Signed)
  Problem: RH Balance Goal: LTG Patient will maintain dynamic standing balance (PT) Description: LTG:  Patient will maintain dynamic standing balance with assistance during mobility activities (PT) Flowsheets (Taken 10/01/2021 1734) LTG: Pt will maintain dynamic standing balance during mobility activities with:: (upgrade due to progress) Supervision/Verbal cueing Note: upgrade due to progress   Problem: RH Bed to Chair Transfers Goal: LTG Patient will perform bed/chair transfers w/assist (PT) Description: LTG: Patient will perform bed to chair transfers with assistance (PT). Flowsheets (Taken 10/01/2021 1734) LTG: Pt will perform Bed to Chair Transfers with assistance level: (upgrade due to progress) Supervision/Verbal cueing Note: upgrade due to progress   Problem: RH Car Transfers Goal: LTG Patient will perform car transfers with assist (PT) Description: LTG: Patient will perform car transfers with assistance (PT). Flowsheets (Taken 10/01/2021 1734) LTG: Pt will perform car transfers with assist:: (upgrade due to progress) Supervision/Verbal cueing Note: upgrade due to progress   Problem: RH Furniture Transfers Goal: LTG Patient will perform furniture transfers w/assist (OT/PT) Description: LTG: Patient will perform furniture transfers  with assistance (OT/PT). Flowsheets (Taken 10/01/2021 1734) LTG: Pt will perform furniture transfers with assist:: (upgrade due to progress) Supervision/Verbal cueing Note: upgrade due to progress   Problem: RH Ambulation Goal: LTG Patient will ambulate in controlled environment (PT) Description: LTG: Patient will ambulate in a controlled environment, # of feet with assistance (PT). Flowsheets (Taken 10/01/2021 1734) LTG: Pt will ambulate in controlled environ  assist needed:: (upgrade due to progress) Contact Guard/Touching assist Note: upgrade due to progress Goal: LTG Patient will ambulate in home environment (PT) Description: LTG: Patient will  ambulate in home environment, # of feet with assistance (PT). Flowsheets (Taken 10/01/2021 1734) LTG: Pt will ambulate in home environ  assist needed:: (upgrade due to progress) Contact Guard/Touching assist Note: upgrade due to progress   Problem: RH Wheelchair Mobility Goal: LTG Patient will propel w/c in controlled environment (PT) Description: LTG: Patient will propel wheelchair in controlled environment, # of feet with assist (PT) Flowsheets (Taken 10/01/2021 1734) LTG: Pt will propel w/c in controlled environ  assist needed:: (upgrade due to progress) Independent with assistive device Note: upgrade due to progress Goal: LTG Patient will propel w/c in home environment (PT) Description: LTG: Patient will propel wheelchair in home environment, # of feet with assistance (PT). Flowsheets (Taken 10/01/2021 1734) LTG: Pt will propel w/c in home environ  assist needed:: (upgrade due to progress) Independent with assistive device Note: upgrade due to progress

## 2021-10-01 NOTE — Plan of Care (Signed)
  Problem: RH SKIN INTEGRITY Goal: RH STG MAINTAIN SKIN INTEGRITY WITH ASSISTANCE Description: STG Maintain Skin Integrity With Supervision Assistance. Flowsheets (Taken 10/01/2021 0018) STG: Maintain skin integrity with assistance: 4-Minimal assistance Goal: RH STG ABLE TO PERFORM INCISION/WOUND CARE W/ASSISTANCE Description: STG Able To Perform Incision/Wound Care With Supervision Assistance. Flowsheets (Taken 10/01/2021 0018) STG: Pt will be able to perform incision/wound care with assistance: 4-Minimal assistance   Problem: RH SAFETY Goal: RH STG ADHERE TO SAFETY PRECAUTIONS W/ASSISTANCE/DEVICE Description: STG Adhere to Safety Precautions With Supervision Assistance/Device. Flowsheets (Taken 10/01/2021 0018) STG:Pt will adhere to safety precautions with assistance/device: 5-Supervision/cueing Goal: RH STG DECREASED RISK OF FALL WITH ASSISTANCE Description: STG Decreased Risk of Fall With Cues and Reminders. Flowsheets (Taken 10/01/2021 0018) [id number] risk of fall  with assistance/device: 5-Supervision/cueing   Problem: RH PAIN MANAGEMENT Goal: RH STG PAIN MANAGED AT OR BELOW PT'S PAIN GOAL Description: < 3 on a 0-10 pain scale. Note: Pain< 3 on pain scale

## 2021-10-01 NOTE — Progress Notes (Signed)
Occupational Therapy Session Note  Patient Details  Name: Carlos [name]. MRN: [id number] Date of Birth: 1971/12/11  Today's Date: 10/01/2021 OT Individual Time: 1130-1200 OT Individual Time Calculation (min): 30 min    Short Term Goals: Week 1:  OT Short Term Goal 1 (Week 1): Pt will complete sit<stand during LB self care with no more than Min A OT Short Term Goal 1 - Progress (Week 1): Met OT Short Term Goal 2 (Week 1): Pt will complete UB dressing with Mod A OT Short Term Goal 2 - Progress (Week 1): Met OT Short Term Goal 3 (Week 1): Pt will complete 1/3 components of toileting with no more than Min balance assistance OT Short Term Goal 3 - Progress (Week 1): Met Week 2:  OT Short Term Goal 2 (Week 2): Pt will perform toileting tasks with mod A OT Short Term Goal 2 - Progress (Week 2): Met OT Short Term Goal 3 (Week 2): Pt will complete UB dressing tasks with min A OT Short Term Goal 3 - Progress (Week 2): Met OT Short Term Goal 4 (Week 2): Pt will complete LB dressing tasks with mod A OT Short Term Goal 4 - Progress (Week 2): Met Week 3:  OT Short Term Goal 1 (Week 3): STG=LTG 2/2 ELOS (continue working towards supervision goals)  Skilled Therapeutic Interventions/Progress Updates:    Pt greeted at time of session sitting up in recliner agreeable to OT session, no pain throughout session. No c/o dizziness throughout session as well. BLEs already ace wrapped and TEDS on. Ambulated short distance recliner > w/c in room with CGA and SPC. W/c transport to gym for time conservation and again ambulated short distance approx 15" w/c <> Nustep with SPC. Only using LE's, performed 8 minutes total with 1 rest break for LE strengthening and global endurance. Back in room, stand pivot w/c > recliner CGA. Call bell in reach all needs met.    Therapy Documentation Precautions:  Precautions Precautions: Fall, Back, Cervical Required Braces or Orthoses: Cervical Brace Cervical Brace: Hard  collar (c-collar to be donned in sitting, Lt UE sling used for comfort) Restrictions Weight Bearing Restrictions: No LUE Weight Bearing: Non weight bearing     Therapy/Group: Individual Therapy  Viona Gilmore 10/01/2021, 7:22 AM

## 2021-10-01 NOTE — Progress Notes (Signed)
Occupational Therapy Session Note  Patient Details  Name: Carlos [name]. MRN: [id number] Date of Birth: 04-12-1971  Today's Date: 10/01/2021 OT Individual Time: 1335-1431 OT Individual Time Calculation (min): 56 min    Skilled Therapeutic Interventions/Progress Updates:    Pt greeted in bed with no c/o pain. Setup to don his c-collar. Asked to start session by using the restroom and brushing his teeth. Min A for ambulatory transfer to the toilet using SPC and CGA for toileting tasks pt having +bladder void. CGA for hand washing and oral care afterwards. He transferred to the w/c and then was escorted outdoors. Worked on dynamic standing balance while ambulating over uneven concrete and brick surfaces while outdoors, pt using his SPC. Worked on widening base of support to minimize LOBs, which he had a couple of. Also worked on incorporating natural arm swing. Min balance assist overall. While seated, pt completed flutter kicks x1 minute holds 3 sets and also hip adduction stretches using the gait belt to help address scissoring of the Rt LE during functional mobility. He returned to his room at close of session and transferred back to bed. Left pt with all needs within reach and bed alarm set.   Therapy Documentation Precautions:  Precautions Precautions: Fall, Back, Cervical Required Braces or Orthoses: Cervical Brace Cervical Brace: Hard collar (c-collar to be donned in sitting, Lt UE sling used for comfort) Restrictions Weight Bearing Restrictions: No LUE Weight Bearing: Non weight bearing Vital Signs: Therapy Vitals Temp: 97.8 F (36.6 C) Temp Source: Oral Pulse Rate: 90 Resp: 17 BP: 116/70 Patient Position (if appropriate): Lying Oxygen Therapy SpO2: 99 % O2 Device: Room Air Pain: no c/o pain during tx Pain Assessment Pain Scale: 0-10 Pain Score: 4  Pain Type: Acute pain Pain Location: Shoulder Pain Orientation: Left;Upper Pain Descriptors / Indicators: Aching Pain  Frequency: Constant Pain Onset: On-going Patients Stated Pain Goal: 4 Pain Intervention(s): Medication (See eMAR) (oxycodone) ADL: ADL Eating: Supervision/safety (using built up utensil) Where Assessed-Eating: Bed level Grooming: Not assessed Upper Body Bathing: Not assessed Lower Body Bathing: Not assessed Upper Body Dressing: Maximal assistance Where Assessed-Upper Body Dressing: Edge of bed Lower Body Dressing: Dependent Where Assessed-Lower Body Dressing: Edge of bed Toileting: Not assessed Toilet Transfer: Not assessed Tub/Shower Transfer: Not assessed    Therapy/Group: Individual Therapy  Lynze Reddy A Hillary Schwegler 10/01/2021, 4:09 PM

## 2021-10-01 NOTE — Progress Notes (Signed)
Patient ID: Carlos [name]., male   DOB: May 27, 1971, 50 y.o.   MRN: [id number]  SW ordered [facility] and w/c with curved cushion and back with [employer] via parachute.   SW met with pt in room to discuss d/c recommendations: w/c, SPC and outpatient therapies. Pt to follow-up about preferred outpatient therapy location. SW informed pt that curved cushion are likely to be out of stock but will order item. Pt understands he may have to purchase on own.   Loralee Pacas, MSW, [place] Office: [phone] Cell: [phone] Fax: [phone]

## 2021-10-02 LAB — GLUCOSE, CAPILLARY
Glucose-Capillary: 124 mg/dL — ABNORMAL HIGH (ref 70–99)
Glucose-Capillary: 99 mg/dL (ref 70–99)
Glucose-Capillary: 102 mg/dL — ABNORMAL HIGH (ref 70–99)
Glucose-Capillary: 150 mg/dL — ABNORMAL HIGH (ref 70–99)

## 2021-10-02 MED ORDER — TAMSULOSIN HCL 0.4 MG PO CAPS
0.4000 mg | ORAL_CAPSULE | Freq: Every day | ORAL | Status: DC
  Administered 2021-10-03: 0.4 mg via ORAL
  Filled 2021-10-02: qty 1

## 2021-10-02 MED ORDER — ENOXAPARIN (LOVENOX) PATIENT EDUCATION KIT
PACK | Freq: Once | Status: AC
  Filled 2021-10-02: qty 1

## 2021-10-02 NOTE — Progress Notes (Addendum)
Patient ID: Carlos [name]., male   DOB: 04-01-1971, 50 y.o.   MRN: [id number]  SW received message from pt reporting that he prefers [facility].   SW faxed PT/OT referral to [facility] ([phone]/[phone]).   SW met with pt in room to review d/c, and confirm d/c date remains 10/20. Pt informed w/c ordered, and comfort cushion is not in stock and will be shipped to the home. No questions/concerns reported at this time.   *SW received updates from PT that pt received elevating legs rests vs swing away. SW waiting on updates from [place] on exchanging.   Loralee Pacas, MSW, [place] Office: [phone] Cell: [phone] Fax: [phone]
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
## 2021-10-02 NOTE — Progress Notes (Signed)
Occupational Therapy Session Note  Patient Details  Name: Carlos [name]. MRN: [id number] Date of Birth: 1971-05-07  Today's Date: 10/02/2021 OT Individual Time: 0800-0900 OT Individual Time Calculation (min): 60 min    Short Term Goals: Week 3:  OT Short Term Goal 1 (Week 3): STG=LTG 2/2 ELOS (continue working towards supervision goals)  Skilled Therapeutic Interventions/Progress Updates:    OT intervention with focus on functional amb with SPC, bathing at shower level, dressing with sit<>stand from seat, standing balance, activity tolerance, and safety awareness to increase independence with BADLs. Amb with SPC to bathroom with CGA (RLE ataxia improving). Bathing at shower level with supervision seated on TTB. Pt requires assistance donning [facility] but otherwise completed all LB dressing tasks with supervision. Pt amb with RW to Day room and then to Gym with CGA. Standing task placing graded clothes pins on basketball net. Supervision when standing on floor. Pt then stood on [employer] with CGA to remove clothes pins. Pt with slight dizziness. Pt returned to room with w/c and transferred to bed with supervision. Pt assisted with donning Ted Hose. Pt remained in bed with all needs within reach and bed alarm activated.  BP supine-110/76; seated-114/77 Pt asymptomatic throughout session without [facility] until end of session. Ted Hose donned at that time.  Therapy Documentation Precautions:  Precautions Precautions: Fall, Back, Cervical Required Braces or Orthoses: Cervical Brace Cervical Brace: Hard collar (c-collar to be donned in sitting, Lt UE sling used for comfort) Restrictions Weight Bearing Restrictions: No LUE Weight Bearing: Non weight bearing   Pain:  Pt c/o Lt shoulder discomfort with activity; repositioned     Therapy/Group: Individual Therapy  Leroy Libman 10/02/2021, 9:08 AM

## 2021-10-02 NOTE — Patient Care Conference (Signed)
Inpatient RehabilitationTeam Conference and Plan of Care Update Date: 10/02/2021   Time: 11:07 AM    Patient Name: Carlos [name].      Medical Record Number: [id number]  Date of Birth: 05-Jan-1971 Sex: Male         Room/Bed: 4W14C/4W14C-01 Payor Info: Payor: [place] / Plan: [employer] COMM PPO / Product Type: *No Product type* /    Admit Date/Time:  09/13/2021  3:24 PM  Primary Diagnosis:  Quadriplegia [facility])  Hospital Problems: Principal Problem:   Quadriplegia ([place]) Active Problems:   Multiple trauma   Adjustment reaction with anxiety   Hyponatremia   Acute blood loss anemia    Expected Discharge Date: Expected Discharge Date: 10/04/21  Team Members Present: Physician leading conference: Dr. Courtney Heys Social Worker Present: Loralee Pacas, [place] Nurse Present: Dorthula Nettles, RN PT Present: Excell Seltzer, PT OT Present: Roanna Epley, [place], OT PPS Coordinator present : Gunnar Fusi, SLP     Current Status/Progress Goal Weekly Team Focus  Bowel/Bladder   Continent of B&B, gets up to [facility] LBM 10/16. Taking scheduled colace. intermittently refusing scheduled miralax & senna.  Maintain continence of B & B.  Monitor effectiveness of bowel program, adjust as needed.   Swallow/Nutrition/ Hydration             ADL's   bathing-min A: LB dressing-CGA; UB dressing-supervision; toileting-min A; funcitonal transfers-CGA  upgraded to supervision overall; min A LB dressing  transfers, standing balance, BADL training, safety awareness, BUE GMC/FMC   Mobility   Supervision bed mobility, CGA transfesr with SPC, gait up to 150 ft with SPC min A, orthostatic  upgraded to Supervision overall  d/c planning, BP management, LE NMR and strengthening, balance   Communication             Safety/Cognition/ Behavioral Observations            Pain   Regularly rates pain between 5 & 7 on pain scale. Taking scheduled robaxin, voltaren, and gabapentin.  Pain  <3. Assess Q Shift and PRN  Assess pain q 4 hours & PRN   Skin   Incision to spine basically healed  No New Skin breakdown. Assess Q shift.  Assess skin Q shift & PRN     Discharge Planning:  Pt will d/c to home with support from his mother, father, and his wife. Mother will provide assistance during the day; when mother in chemo treatment, his father will provide support. Pt wife will be available for evenings.   Team Discussion: Discontinued SSI, Flomax changed to be given in evening. Gave letter to patient as requested. Will go home on Lovenox. Continent B/B, LBM 10/16. Reports 6/10 pain. Neck and back incision CDI. Set up with [facility]. W/C ordered. Cushion is special order and may have to order himself.  Patient on target to meet rehab goals: yes, contact guard/supervision overall. BP doing better, working on balance. Close to goal level. Supine/sitting EOB, BP remained stable. Had a little dizziness in gym, returned to room and put Ted's on.  *See Care Plan and progress notes for long and short-term goals.   Revisions to Treatment Plan:  Adjusting medications, discontinued SSI.  Teaching Needs: Family education, medication management, pain management, skin/wound care, safety awareness.  Current Barriers to Discharge: Decreased caregiver support, Home enviroment access/layout, Neurogenic bowel and bladder, Wound care, Lack of/limited family support, Weight, Medication compliance, and Behavior  Possible Resolutions to Barriers: Family education, DME ordered,  may have to purchase own cushion. Wean pain medication.     Medical Summary Current Status: pt not slef tapering pain meds- so will taper him- continent;  LBM 10/16;  CBGs good since not taking SSI; incision look good  Barriers to Discharge: Behavior;Decreased family/caregiver support;Home enviroment access/layout;Neurogenic Bowel & Bladder;Weight  Barriers to Discharge Comments: outpt PT/OT- special order for custom  cushion by d/c. Possible Resolutions to [employer]: will d/c SSI; change FLomax to 0.4 mg QHS; gave letter- educated lovenox for 60 days; discussed pain meds - needs to reduce; BP- needs thigh TEDs- no Abd binder/ACE wraps; con't florinef- shower this AM- no drop of BP with OTA this AM; d/c 10/04/21   Continued Need for Acute Rehabilitation Level of Care: The patient requires daily medical management by a physician with specialized training in physical medicine and rehabilitation for the following reasons: Direction of a multidisciplinary physical rehabilitation program to maximize functional independence : Yes Medical management of patient stability for increased activity during participation in an intensive rehabilitation regime.: Yes Analysis of laboratory values and/or radiology reports with any subsequent need for medication adjustment and/or medical intervention. : Yes   I attest that I was present, lead the team conference, and concur with the assessment and plan of the team.   Cristi Loron 10/02/2021, 3:16 PM
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
## 2021-10-02 NOTE — Progress Notes (Signed)
Occupational Therapy Session Note  Patient Details  Name: Carlos [name]. MRN: [id number] Date of Birth: 03-08-1971  Today's Date: 10/02/2021 OT Individual Time: 1130-1157 OT Individual Time Calculation (min): 27 min    Short Term Goals: Week 3:  OT Short Term Goal 1 (Week 3): STG=LTG 2/2 ELOS (continue working towards supervision goals)  Skilled Therapeutic Interventions/Progress Updates:    Pt resting in bed upon arrival. OT intervention with focus on RUE/hand strengthening and [place]. Pt instructed in activities with focus on pinch strengthening. Dynamometer-Rt 40psi, Lt 55psi Pinch strength-Rt 4psi, Lt 9 psi  Pt remained in bed with all needs within reach and bed alarm activated.  Therapy Documentation Precautions:  Precautions Precautions: Fall, Back, Cervical Required Braces or Orthoses: Cervical Brace Cervical Brace: Hard collar (c-collar to be donned in sitting, Lt UE sling used for comfort) Restrictions Weight Bearing Restrictions: No LUE Weight Bearing: Non weight bearing   Pain: Pt denies pain this morning   Therapy/Group: Individual Therapy  Leroy Libman 10/02/2021, 12:56 PM

## 2021-10-02 NOTE — Progress Notes (Signed)
Physical Therapy Session Note  Patient Details  Name: Carlos [name]. MRN: [id number] Date of Birth: [birth year]  Today's Date: 10/02/2021 PT Individual Time: 1000-1100; 1400-1455 PT Individual Time Calculation (min): 60 min and 55 min  Short Term Goals: Week 2:  PT Short Term Goal 1 (Week 2): Pt will ambulate x 100 ft with LRAD and min A PT Short Term Goal 2 (Week 2): Pt will initiate stair training PT Short Term Goal 3 (Week 2): Pt will tolerate standing x 5 min during functional task without onset of OH  Skilled Therapeutic Interventions/Progress Updates:    Session 1: Pt received seated in bed, agreeable to PT session. No complaints of pain. Bed mobility mod I. Sit to stand and transfer with [facility] and close Supervision during session. Manual w/c propulsion x 150 ft with use of RUE and BLE at Supervision level, cues to adhere to LUE NWB. Provided HEP and reviewed with patient (see below). Pt reports urge to use the bathroom. Ambulation x 150 ft with SPC and CGA for balance. Pt left seated on BSC in room with needs in reach, call button in reach.  Access Code: [email or web address] URL: [email or web address] Date: 10/02/2021 Prepared by: Excell Seltzer  Exercises Standing Hip Abduction with Counter Support - 1 x daily - 7 x weekly - 3 sets - 10 reps Standing Hip Extension with Counter Support - 1 x daily - 7 x weekly - 3 sets - 10 reps Mini Squat with Counter Support - 1 x daily - 7 x weekly - 3 sets - 10 reps Heel Raises with Counter Support - 1 x daily - 7 x weekly - 3 sets - 10 reps Side Stepping with Counter Support - 1 x daily - 7 x weekly - 3 sets - 10 reps Forward Step Up with Counter Support - 1 x daily - 7 x weekly - 3 sets - 10 reps Standing Hamstring Curl with Chair Support - 1 x daily - 7 x weekly - 3 sets - 10 reps Supine Bridge - 1 x daily - 7 x weekly - 3 sets - 10 reps - 5 hold Seated Hip Abduction with Resistance - 1 x daily - 7 x weekly - 3 sets - 10  reps  Session 2: Pt received supine in bed, agreeable to PT session. No complaints of pain. Bed mobility mod I with increased time needed. Sit to stand and stand pivot transfer with and without SPC at close Supervision level throughout session. Pt has received w/c he will d/c home with, assisted pt with setting up w/c and adjusted back rest, seat cushion, and leg rests. Pt received ELR which are not able to be adjusted long enough to accommodate patient's legs, CSW notified. Pt taken outdoors for remainder of session for gait training in functional environment. Ambulation up to 200 ft with SPC and CGA navigating across uneven ground, up/down inclines, up/down 6" stairs with one handrail, no LOB noted. Standing alt L/R 6" step-ups with R handrail and CGA for balance for LE strengthening, x 10 reps. Manual w/c propulsion x 200 ft with use of BLE and RUE at mod I level. Pt returned to bed at end of session, mod I for bed mobility. Pt left supine in bed with needs in reach, bed alarm in place.  Therapy Documentation Precautions:  Precautions Precautions: Fall, Back, Cervical Required Braces or Orthoses: Cervical Brace Cervical Brace: Hard collar (c-collar to be donned in sitting, Lt UE sling used for comfort) Restrictions Weight  Bearing Restrictions: No LUE Weight Bearing: Non weight bearing     Therapy/Group: Individual Therapy   Excell Seltzer, PT, DPT, CSRS  10/02/2021, 1:59 PM

## 2021-10-02 NOTE — Progress Notes (Signed)
+/-   sleep. PRN xanax & Oxy IR 37m's given at 2216. MPatrici RanksA

## 2021-10-03 ENCOUNTER — Other Ambulatory Visit (HOSPITAL_COMMUNITY): Payer: Self-pay

## 2021-10-03 LAB — GLUCOSE, CAPILLARY
Glucose-Capillary: 116 mg/dL — ABNORMAL HIGH (ref 70–99)
Glucose-Capillary: 120 mg/dL — ABNORMAL HIGH (ref 70–99)
Glucose-Capillary: 101 mg/dL — ABNORMAL HIGH (ref 70–99)
Glucose-Capillary: 173 mg/dL — ABNORMAL HIGH (ref 70–99)

## 2021-10-03 MED ORDER — GLIPIZIDE 5 MG PO TABS
5.0000 mg | ORAL_TABLET | Freq: Every day | ORAL | 0 refills | Status: DC
  Filled 2021-10-03: qty 30, 30d supply, fill #0

## 2021-10-03 MED ORDER — VITAMIN B COMPLEX PO TABS
ORAL_TABLET | Freq: Every day | ORAL | 0 refills | Status: AC
  Filled 2021-10-03: qty 30, 30d supply, fill #0

## 2021-10-03 MED ORDER — FOLIC ACID 1 MG PO TABS
1.0000 mg | ORAL_TABLET | Freq: Every day | ORAL | 0 refills | Status: DC
  Filled 2021-10-03: qty 30, 30d supply, fill #0

## 2021-10-03 MED ORDER — GABAPENTIN 100 MG PO CAPS
200.0000 mg | ORAL_CAPSULE | Freq: Three times a day (TID) | ORAL | 0 refills | Status: DC
  Filled 2021-10-03: qty 180, 30d supply, fill #0

## 2021-10-03 MED ORDER — SENNA 8.6 MG PO TABS: 1.0000 | ORAL_TABLET | Freq: Every day | ORAL | 0 refills | Status: DC

## 2021-10-03 MED ORDER — METHOCARBAMOL 500 MG PO TABS
1000.0000 mg | ORAL_TABLET | Freq: Three times a day (TID) | ORAL | 0 refills | Status: DC
  Filled 2021-10-03: qty 180, 30d supply, fill #0

## 2021-10-03 MED ORDER — ASPIRIN 81 MG PO TBEC: 81.0000 mg | DELAYED_RELEASE_TABLET | Freq: Every day | ORAL | 11 refills | Status: AC

## 2021-10-03 MED ORDER — PAROXETINE HCL 20 MG PO TABS
20.0000 mg | ORAL_TABLET | Freq: Every day | ORAL | 0 refills | Status: DC
  Filled 2021-10-03: qty 30, 30d supply, fill #0

## 2021-10-03 MED ORDER — GEMFIBROZIL 600 MG PO TABS
600.0000 mg | ORAL_TABLET | Freq: Two times a day (BID) | ORAL | 0 refills | Status: DC
  Filled 2021-10-03: qty 60, 30d supply, fill #0

## 2021-10-03 MED ORDER — TAMSULOSIN HCL 0.4 MG PO CAPS
0.4000 mg | ORAL_CAPSULE | Freq: Every day | ORAL | 0 refills | Status: DC
  Filled 2021-10-03: qty 30, 30d supply, fill #0

## 2021-10-03 MED ORDER — METFORMIN HCL 1000 MG PO TABS
1000.0000 mg | ORAL_TABLET | Freq: Two times a day (BID) | ORAL | 0 refills | Status: DC
  Filled 2021-10-03: qty 60, 30d supply, fill #0

## 2021-10-03 MED ORDER — DICLOFENAC SODIUM 50 MG PO TBEC
50.0000 mg | DELAYED_RELEASE_TABLET | Freq: Three times a day (TID) | ORAL | 0 refills | Status: DC
  Filled 2021-10-03: qty 90, 30d supply, fill #0

## 2021-10-03 MED ORDER — FLUDROCORTISONE ACETATE 0.1 MG PO TABS
0.2000 mg | ORAL_TABLET | Freq: Every day | ORAL | 0 refills | Status: DC
  Filled 2021-10-03: qty 60, 30d supply, fill #0

## 2021-10-03 MED ORDER — EMPAGLIFLOZIN 10 MG PO TABS
10.0000 mg | ORAL_TABLET | Freq: Every day | ORAL | 0 refills | Status: DC
  Filled 2021-10-03: qty 30, 30d supply, fill #0

## 2021-10-03 MED ORDER — POLYETHYLENE GLYCOL 3350 17 G PO PACK: 17.0000 g | PACK | Freq: Two times a day (BID) | ORAL | 0 refills | Status: DC

## 2021-10-03 MED ORDER — MIDODRINE HCL 10 MG PO TABS
10.0000 mg | ORAL_TABLET | Freq: Three times a day (TID) | ORAL | 0 refills | Status: DC
  Filled 2021-10-03: qty 90, 30d supply, fill #0

## 2021-10-03 MED ORDER — ADULT MULTIVITAMIN W/MINERALS CH: 1.0000 | ORAL_TABLET | Freq: Every day | ORAL | Status: AC

## 2021-10-03 MED ORDER — ENOXAPARIN SODIUM 40 MG/0.4ML IJ SOSY
PREFILLED_SYRINGE | INTRAMUSCULAR | 0 refills | Status: DC
  Filled 2021-10-03: qty 12, 30d supply, fill #0

## 2021-10-03 MED ORDER — DICLOFENAC SODIUM 1 % EX GEL
2.0000 g | Freq: Four times a day (QID) | CUTANEOUS | 0 refills | Status: DC
  Filled 2021-10-03: qty 200, 30d supply, fill #0

## 2021-10-03 MED ORDER — OXYCODONE HCL 10 MG PO TABS
10.0000 mg | ORAL_TABLET | Freq: Three times a day (TID) | ORAL | 0 refills | Status: DC | PRN
  Filled 2021-10-03: qty 21, 7d supply, fill #0

## 2021-10-03 MED ORDER — OXYCODONE HCL 10 MG PO TABS
10.0000 mg | ORAL_TABLET | ORAL | 0 refills | Status: DC | PRN
  Filled 2021-10-03: qty 30, 5d supply, fill #0

## 2021-10-03 MED ORDER — OXYCODONE HCL 5 MG PO TABS
10.0000 mg | ORAL_TABLET | ORAL | Status: DC | PRN
  Administered 2021-10-03 – 2021-10-04 (×5): 10 mg via ORAL
  Filled 2021-10-03 (×5): qty 2

## 2021-10-03 MED ORDER — ACETAMINOPHEN 325 MG PO TABS: 325.0000 mg | ORAL_TABLET | ORAL | Status: AC | PRN

## 2021-10-03 MED ORDER — ALPRAZOLAM 0.25 MG PO TABS
0.2500 mg | ORAL_TABLET | Freq: Two times a day (BID) | ORAL | 0 refills | Status: DC | PRN
  Filled 2021-10-03: qty 30, 15d supply, fill #0

## 2021-10-03 NOTE — Progress Notes (Signed)
Pt reports ready for discharge. Pt administering Lovenox injections to himself, equipment has been received, went over discharge process. Pt is ready for discharge tomorrow.

## 2021-10-03 NOTE — Plan of Care (Signed)
Care plan completed/met 

## 2021-10-03 NOTE — Progress Notes (Signed)
[place] collar replacement pads obtained for pt on Monday. Pt's wife states she took those home.  Pt self administered Lovenox injection this morning. He states he feels comfortable and tolerated well.   Gerald Stabs, RN

## 2021-10-03 NOTE — Progress Notes (Signed)
Occupational Therapy Session Note  Patient Details  Name: Carlos [name]. MRN: [id number] Date of Birth: 01-31-1971  Today's Date: 10/03/2021 OT Individual Time: 7614-7092 OT Individual Time Calculation (min): 70 min    Short Term Goals: Week 3:  OT Short Term Goal 1 (Week 3): STG=LTG 2/2 ELOS (continue working towards supervision goals)  Skilled Therapeutic Interventions/Progress Updates:    Pt resting in bed upon arrival. [facility] donned prior to transferring to w/c. OT intervention with continued RUE GMC/FMC, strengthening activities with theraputty. Pt issued HEP for theraputty. Pt independent with following directions of handout. Pt engaged in pipe tree task using BUE, shuffling and dealing cards, inhand manipulation of coins, and grip strengthening exercises. Pt pleased with progress and ready for discharge home tomorrow.   Therapy Documentation Precautions:  Precautions Precautions: Fall, Back, Cervical Required Braces or Orthoses: Cervical Brace Cervical Brace: Hard collar Restrictions Weight Bearing Restrictions: No LUE Weight Bearing: Non weight bearing General:   Vital Signs:   Pain: Pain Assessment Pain Scale: 0-10 Pain Score: 5  Pain Type: Acute pain;Surgical pain Pain Location: Shoulder Pain Orientation: Left Pain Descriptors / Indicators: Aching Pain Frequency: Constant Pain Onset: On-going Pain Intervention(s): Medication (See eMAR) ADL: ADL Eating: Supervision/safety (using built up utensil) Where Assessed-Eating: Bed level Grooming: Not assessed Upper Body Bathing: Not assessed Lower Body Bathing: Not assessed Upper Body Dressing: Maximal assistance Where Assessed-Upper Body Dressing: Edge of bed Lower Body Dressing: Dependent Where Assessed-Lower Body Dressing: Edge of bed Toileting: Not assessed Toilet Transfer: Not assessed Tub/Shower Transfer: Not assessed Vision   Perception    Praxis Praxis: Intact Exercises:   Other Treatments:      Therapy/Group: Individual Therapy  Leroy Libman 10/03/2021, 11:33 AM

## 2021-10-03 NOTE — Progress Notes (Signed)
Late entry: Educated pt on Lovenox injections. Showed him how to give medication. Informed that will have to do the Lovenox injections to show he can do them correctly. Pt reports that used to give himself injections and should not be an issue. Ordered Lovenox kit for home. Provided eduction on home use and discarding used syringes. Gave pt Lovenox kit and asked him to look over the information. Pt verbalizes an understanding for purpose for medication as well as administering medication and discarding medication.

## 2021-10-03 NOTE — Progress Notes (Signed)
Physical Therapy Session Note  Patient Details  Name: Carlos [name]. MRN: [id number] Date of Birth: 01-Aug-1971  Today's Date: 10/03/2021 PT Individual Time: 1020-1120 PT Individual Time Calculation (min): 60 min   Short Term Goals: Week 2:  PT Short Term Goal 1 (Week 2): Pt will ambulate x 100 ft with LRAD and min A PT Short Term Goal 2 (Week 2): Pt will initiate stair training PT Short Term Goal 3 (Week 2): Pt will tolerate standing x 5 min during functional task without onset of OH  Skilled Therapeutic Interventions/Progress Updates: Pt presented in bed agreeable to therapy. Pt states slight pain in L ribs/shoulder area. Session focused on functional mobility in preparation for d/c. Performed bed mobility mod I and donned cx collar with set up. Pt then ambulated to ortho gym with close S fading to CGA and min cues for pacing. Pt noted to be more CGA with increased pacing.  Pt noted to have decreased BOS and near scissoring gait particularly with turns. Pt participated in car transfer mod I with SPC, gait up/down ramp, and gait on mulch with overall CGA. Pt then ambulated to rehab gym and participated in ascending/descending x 12 steps with alternating 1 rail and close S. Pt indicated that his father was admitted to hospital therefore performed w/c mobility performed to 2W using BLE independently to propel. After brief visit with father pt propelled back to room and performed stand pivot with [facility] and supervision to bed. PTA then adjusted R w/c break as was significantly tighter then L. Pt left in bed with call bell within reach and needs met.      Therapy Documentation Precautions:  Precautions Precautions: Fall, Back, Cervical Required Braces or Orthoses: Cervical Brace Cervical Brace: Hard collar Restrictions Weight Bearing Restrictions: No LUE Weight Bearing: Non weight bearing General:   Vital Signs: Therapy Vitals Temp: 99.1 F (37.3 C) Temp Source: Oral Pulse Rate:  72 Resp: 18 BP: (!) 144/93 Patient Position (if appropriate): Sitting Oxygen Therapy O2 Device: Room Air Pain: Pain Assessment Pain Scale: 0-10 Pain Score: 7  Pain Type: Acute pain Pain Location: Back Pain Descriptors / Indicators: Aching Pain Frequency: Constant Pain Onset: On-going Patients Stated Pain Goal: 4 Pain Intervention(s): Medication (See eMAR) Mobility: Bed Mobility Bed Mobility: Rolling Right;Rolling Left;Sit to Supine;Supine to Sit Rolling Right: Independent Rolling Left: Independent Supine to Sit: Independent Sit to Supine: Independent Transfers Transfers: Sit to [employer] Transfers Sit to Stand: Supervision/Verbal cueing Stand Pivot Transfers: Supervision/Verbal cueing Transfer (Assistive device): Straight cane Locomotion :    Trunk/Postural Assessment : Cervical Assessment Cervical Assessment: Exceptions to [facility] ([place] collar) Thoracic Assessment Thoracic Assessment: Exceptions to [facility] (spinal precautions) Lumbar Assessment Lumbar Assessment: Exceptions to [facility] (posterior lepvic tilt) Postural Control Postural Control: Deficits on evaluation  Balance: Balance Balance Assessed: Yes Static Sitting Balance Static Sitting - Balance Support: Feet supported Static Sitting - Level of Assistance: 6: Modified independent (Device/Increase time) Dynamic Sitting Balance Dynamic Sitting - Balance Support: During functional activity Dynamic Sitting - Level of Assistance: 5: Stand by assistance Static Standing Balance Static Standing - Balance Support: No upper extremity supported Static Standing - Level of Assistance: 5: Stand by assistance Dynamic Standing Balance Dynamic Standing - Balance Support: No upper extremity supported Dynamic Standing - Level of Assistance: 5: Stand by assistance Exercises:   Other Treatments:      Therapy/Group: Individual Therapy  Lovis More 10/03/2021, 4:11 PM

## 2021-10-03 NOTE — Progress Notes (Signed)
Physical Therapy Discharge Summary  Patient Details  Name: Carlos [name]. MRN: [id number] Date of Birth: 04-Sep-1971  Patient has met 9 of 9 long term goals due to improved activity tolerance, improved balance, improved postural control, increased strength, decreased pain, and ability to compensate for deficits.  Patient to discharge at an ambulatory level  CGA  and wheelchair level mod I.   Patient's care partner is independent to provide the necessary physical assistance at discharge.  Reasons goals not met: Pt has met all goals.  Recommendation:  Patient will benefit from ongoing skilled PT services in outpatient setting to continue to advance safe functional mobility, address ongoing impairments in endurance, strength, balance, safety, independence with functional mobility, and minimize fall risk.  Equipment: 16x18 w/c, SPC  Reasons for discharge: treatment goals met and discharge from hospital  Patient/family agrees with progress made and goals achieved: Yes  PT Discharge Precautions/Restrictions Precautions Precautions: Fall;Back;Cervical Required Braces or Orthoses: Cervical Brace Cervical Brace: Hard collar Pain Interference Pain Interference Pain Effect on Sleep: 1. Rarely or not at all Pain Interference with Therapy Activities: 1. Rarely or not at all Pain Interference with Day-to-Day Activities: 2. Occasionally Vision/Perception  Vision - History Ability to See in Adequate Light: 0 Adequate Perception Perception: Within Functional Limits Praxis Praxis: Intact  Cognition Overall Cognitive Status: Within Functional Limits for tasks assessed Arousal/Alertness: Awake/alert Sensation Sensation Light Touch: Impaired Detail Light Touch Impaired Details: Impaired RUE;Impaired LUE Hot/Cold: Appears Intact Proprioception: Impaired Detail Stereognosis: Not tested Coordination Gross Motor Movements are Fluid and Coordinated: Yes Fine Motor Movements are Fluid and  Coordinated: Yes Motor  Motor Motor: Tetraplegia Motor - Skilled Clinical Observations: asia D tetraplegia with BLE ataxia R>L Motor - Discharge Observations: improved from Eval  Mobility Bed Mobility Bed Mobility: Rolling Right;Rolling Left;Sit to Supine;Supine to Sit Rolling Right: Independent Rolling Left: Independent Supine to Sit: Independent Sit to Supine: Independent Transfers Transfers: Sit to [employer] Transfers Sit to Stand: Supervision/Verbal cueing Stand Pivot Transfers: Supervision/Verbal cueing Transfer (Assistive device): Straight cane Locomotion  Gait Ambulation: Yes Gait Assistance: Contact Guard/Touching assist Gait Distance (Feet): 200 Feet Assistive device: Straight cane Gait Gait: Yes Gait Pattern: Impaired Gait velocity: slight ataxia, with occasional scissoring particularly during turns Stairs / Additional Locomotion Stairs: Yes Stairs Assistance: Contact Guard/Touching assist Stair Management Technique: One rail Right;One rail Left Number of Stairs: 12 Height of Stairs: 6 Ramp: Supervision/Verbal cueing Pick up small object from the floor assist level: Contact Guard/Touching assist Wheelchair Mobility Wheelchair Mobility: Yes Wheelchair Assistance: Set up Lexicographer: Both lower extermities Wheelchair Parts Management: Supervision/cueing Distance: 257f  Trunk/Postural Assessment  Cervical Assessment Cervical Assessment: Exceptions to [facility] collar) Thoracic Assessment Thoracic Assessment: Exceptions to [facility](spinal precautions) Lumbar Assessment Lumbar Assessment: Exceptions to [facility](posterior lepvic tilt) Postural Control Postural Control: Deficits on evaluation  Balance Balance Balance Assessed: Yes Static Sitting Balance Static Sitting - Balance Support: Feet supported Static Sitting - Level of Assistance: 6: Modified independent (Device/Increase time) Dynamic Sitting Balance Dynamic Sitting - Balance  Support: During functional activity Dynamic Sitting - Level of Assistance: 5: Stand by assistance Static Standing Balance Static Standing - Balance Support: No upper extremity supported Static Standing - Level of Assistance: 5: Stand by assistance Dynamic Standing Balance Dynamic Standing - Balance Support: No upper extremity supported Dynamic Standing - Level of Assistance: 5: Stand by assistance Extremity Assessment  RUE Assessment RUE Assessment: Exceptions to [facility] Range of Motion (AROM) Comments: WNL excluding hand, increased flexion  of digits at rest (primarily digits 4 and 5) LUE Assessment LUE Assessment: Exceptions to [facility] Active Range of Motion (AROM) Comments: Pt NWB, unable to perform full ROM testing due to pain, unable to lift the Lt arm though pt able to flex elbow and use his hand functionally RLE Assessment RLE Assessment: Exceptions to [facility] Strength Comments: Grossly 4/5 proximal to distal LLE Assessment LLE Assessment: Exceptions to [facility] Strength Comments: Grossly 4+/5 proximal to distal     Excell Seltzer, PT, DPT, CSRS 10/03/2021, 12:26 PM

## 2021-10-03 NOTE — Progress Notes (Signed)
Patient ID: Carlos Frieze., male   DOB: Oct 12, 1971, 50 y.o.   MRN: [id number]  SW received updates from [facility] that swing away leg rests are out of stock and can be shipped to the home or Carlos [name] can pick up in retail store.   SW met with Carlos [name] to discuss swing away leg rests not in stock, and he can pick up. Carlos [name] would like to pick up from retail store. Carlos [name] also reported he purchased comfort cushion. SW to order leg extensions. Carlos [name] received w/c and cane.  SW cancelled comfort cushion and ordered leg extenders with [employer]. *SW cancelled extenders since the legs can be extended at the branch. SW met with Carlos [name] to discuss.   Loralee Pacas, MSW, [place] Office: [phone] Cell: [phone] Fax: [phone]
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
## 2021-10-03 NOTE — Progress Notes (Signed)
Occupational Therapy Discharge Summary  Patient Details  Name: Carlos [name]. MRN: [id number] Date of Birth: 02-Aug-1971  Patient has met 9 of 9 long term goals due to improved activity tolerance, improved balance, postural control, ability to compensate for deficits, and functional use of  RIGHT upper extremity.  Pt made excellent progress with BADLs and functional transfers during this admission. Pt continues to experience OH and recommended donning [facility] each day before getting OOB. Pt is supervisoin for all BADLs and functional transfers. Pt uses RUE at diminished level. Pt's wife has been present for therapy and provides the appropriate level of supervision/assistance. Patient to discharge at overall Supervision level.  Patient's care partner is independent to provide the necessary physical assistance at discharge.     Recommendation:  Patient will benefit from ongoing skilled OT services in outpatient setting to continue to advance functional skills in the area of BADL and Reduce care partner burden.  Equipment: No equipment provided  Reasons for discharge: treatment goals met and discharge from hospital  Patient/family agrees with progress made and goals achieved: Yes  OT Discharge   Vision Baseline Vision/History: 1 Wears glasses Patient Visual Report: No change from baseline Vision Assessment?: No apparent visual deficits Perception  Perception: Within Functional Limits Praxis Praxis: Intact Cognition Overall Cognitive Status: Within Functional Limits for tasks assessed Arousal/Alertness: Awake/alert Orientation Level: Oriented X4 Immediate Memory Recall: Blue;Sock;Bed Memory Recall Sock: Without Cue Memory Recall Blue: Without Cue Memory Recall Bed: Without Cue Awareness: Appears intact Problem Solving: Appears intact Safety/Judgment: Appears intact Sensation Sensation Light Touch: Impaired Detail Light Touch Impaired Details: Impaired RUE;Impaired  LUE Hot/Cold: Appears Intact Proprioception: Impaired Detail Proprioception Impaired Details: Impaired RLE;Impaired LLE Stereognosis: Not tested Coordination Gross Motor Movements are Fluid and Coordinated: Yes Fine Motor Movements are Fluid and Coordinated: Yes Finger Nose Finger Test: WNL Rt, unable to complete on the Lt Motor  Motor Motor - Skilled Clinical Observations: asia D tetraplegia with BLE ataxia R>L    Trunk/Postural Assessment  Cervical Assessment Cervical Assessment: Exceptions to [facility] (cervical collar) Thoracic Assessment Thoracic Assessment: Exceptions to [facility] (back precautions) Lumbar Assessment Lumbar Assessment: Exceptions to [facility] (posterior pelvic tilt)  Balance Static Sitting Balance Static Sitting - Level of Assistance: 6: Modified independent (Device/Increase time) Dynamic Sitting Balance Dynamic Sitting - Balance Support: During functional activity Dynamic Sitting - Level of Assistance: 5: Stand by assistance Extremity/Trunk Assessment RUE Assessment RUE Assessment: Exceptions to [facility] Active Range of Motion (AROM) Comments: WNL excluding hand, increased flexion of digits at rest (primarily digits 4 and 5) LUE Assessment LUE Assessment: Exceptions to [facility] Active Range of Motion (AROM) Comments: Pt NWB, unable to perform full ROM testing due to pain, unable to lift the Lt arm though pt able to flex elbow and use his hand functionally   Leroy Libman 10/03/2021, 6:54 AM

## 2021-10-03 NOTE — Progress Notes (Signed)
Physical Therapy Session Note  Patient Details  Name: Carlos [name]. MRN: [id number] Date of Birth: 12/06/1971  Today's Date: 10/03/2021 PT Individual Time: 1445-1545 PT Individual Time Calculation (min): 60 min   Short Term Goals: Week 2:  PT Short Term Goal 1 (Week 2): Pt will ambulate x 100 ft with LRAD and min A PT Short Term Goal 2 (Week 2): Pt will initiate stair training PT Short Term Goal 3 (Week 2): Pt will tolerate standing x 5 min during functional task without onset of OH  Skilled Therapeutic Interventions/Progress Updates:    Pt received seated in bed, agreeable to PT session. Pt reports some pain in his back from straining when adjusting w/c and packing up his room for d/c home tomorrow. Reminded pt of back precautions. Bed mobility mod I. Sit to stand and stand pivot transfer with [facility] and Supervision throughout session. Session focus on standing balance. Static stance with no AD and CGA performing ball toss against rebounder in normal stance, Romberg stance, normal stance on airex, Romberg stance on airex, L/R modified tandem stance on airex x 15 reps each. Sidesteps L/R 2 x 30 ft with no AD and CGA for balance, decreased control of RLE noted. Forward/backward ambulation with no AD 3 x 30 ft with CGA to min A for balance, decreased LE control and balance when ambulating backwards. Static standing x 10 min with no AD performing Wii bowling, no LOB noted and no onset of OH symptoms. Pt wearing THT during session. Pt returned to bed at end of session, mod I for bed mobility.  Therapy Documentation Precautions:  Precautions Precautions: Fall, Back, Cervical Required Braces or Orthoses: Cervical Brace Cervical Brace: Hard collar Restrictions Weight Bearing Restrictions: No LUE Weight Bearing: Non weight bearing     Therapy/Group: Individual Therapy   Excell Seltzer, PT, DPT, CSRS  10/03/2021, 5:26 PM

## 2021-10-04 ENCOUNTER — Other Ambulatory Visit (HOSPITAL_COMMUNITY): Payer: Self-pay

## 2021-10-04 NOTE — Progress Notes (Deleted)
Inpatient Rehabilitation Medication Discharge Review by a Pharmacist  A complete drug regimen review was completed for this patient to identify any potential clinically significant medication issues.  High Risk Drug Classes Is patient taking? Indication by Medication  Antipsychotic No   Anticoagulant No   Antibiotic No   Opioid Yes Tramadol, Oxy prn s/p [facility] with multiple fx's  Antiplatelet Yes ASA, taking PTA. Indication unspecified  Hypoglycemics/insulin Yes Empag, metformin for h/o DM  Vasoactive Medication No   Chemotherapy No   Other No      Type of Medication Issue Identified Description of Issue Recommendation(s)  Drug Interaction(s) (clinically significant)     Duplicate Therapy     Allergy     No Medication Administration End Date     Incorrect Dose     Additional Drug Therapy Needed     Significant med changes from prior encounter (inform family/care partners about these prior to discharge).    Other       Clinically significant medication issues were identified that warrant physician communication and completion of prescribed/recommended actions by midnight of the next day:  No  Time spent performing this drug regimen review (minutes):  10 min  Onnie Boer, PharmD, [place], AAHIVP, CPP Infectious Disease Pharmacist 10/04/2021 9:31 AM

## 2021-10-04 NOTE — Progress Notes (Signed)
Inpatient Rehabilitation Care Coordinator Discharge Note   Patient Details  Name: Carlos [name]. MRN: [id number] Date of Birth: 01-31-1971   Discharge location: D/c to home with his wife and support from his family/.  Length of Stay: 20 days  Discharge activity level: ambulatory level  CGA  and wheelchair level mod I  Home/community participation: Limited  Patient response FP:OIPPGF Literacy - How often do you need to have someone help you when you read instructions, pamphlets, or other written material from your doctor or pharmacy?: Never  Patient response QM:KJIZXY Isolation - How often do you feel lonely or isolated from those around you?: Never  Services provided included: MD, RD, PT, OT, RN, CM, TR, Pharmacy, Neuropsych, SW  Financial Services:  Charity fundraiser Utilized: [employer]  Choices offered to/list presented to: Yes  Follow-up services arranged:  Outpatient, DME    Outpatient Servicies: [facility] for PT/OT DME : [facility] for w/c and cane    Patient response to transportation need: Is the patient able to respond to transportation needs?: Yes In the past 12 months, has lack of transportation kept you from medical appointments or from getting medications?: No In the past 12 months, has lack of transportation kept you from meetings, work, or from getting things needed for daily living?: No  Comments (or additional information):  Patient/Family verbalized understanding of follow-up arrangements:  Yes  Individual responsible for coordination of the follow-up plan: contact pt [phone]  Confirmed correct DME delivered: Rana Snare 10/04/2021    Rana Snare

## 2021-10-04 NOTE — Progress Notes (Addendum)
Inpatient Rehabilitation Medication Discharge Review by a Pharmacist  A complete drug regimen review was completed for this patient to identify any potential clinically significant medication issues.  High Risk Drug Classes Is patient taking? Indication by Medication  Antipsychotic yes Paxil for depression  Anticoagulant Yes Stopping at dc  Antibiotic No   Opioid Yes Oxy prn s/p [facility] with multiple fx's  Antiplatelet Yes ASA, taking PTA. Indication unspecified  Hypoglycemics/insulin Yes Empag, SSI, metformin, glipizide for h/o DM  Vasoactive Medication No   Chemotherapy No   Other yes Neurontin, florinef, flomax, midodrine     Type of Medication Issue Identified Description of Issue Recommendation(s)  Drug Interaction(s) (clinically significant)     Duplicate Therapy     Allergy     No Medication Administration End Date     Incorrect Dose     Additional Drug Therapy Needed     Significant med changes from prior encounter (inform family/care partners about these prior to discharge).    Other       Clinically significant medication issues were identified that warrant physician communication and completion of prescribed/recommended actions by midnight of the next day:  No  Time spent performing this drug regimen review (minutes):  10 min  Onnie Boer, PharmD, [place], AAHIVP, CPP Infectious Disease Pharmacist 10/04/2021 9:50 AM
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
## 2021-10-09 ENCOUNTER — Encounter: Payer: Self-pay | Admitting: Occupational Therapy

## 2021-10-09 ENCOUNTER — Other Ambulatory Visit (HOSPITAL_BASED_OUTPATIENT_CLINIC_OR_DEPARTMENT_OTHER): Payer: Self-pay | Admitting: Orthopaedic Surgery

## 2021-10-09 ENCOUNTER — Ambulatory Visit: Payer: BC Managed Care – PPO | Admitting: Physical Therapy

## 2021-10-09 ENCOUNTER — Telehealth: Payer: Self-pay | Admitting: Physical Medicine and Rehabilitation

## 2021-10-09 DIAGNOSIS — S42002D Fracture of unspecified part of left clavicle, subsequent encounter for fracture with routine healing: Secondary | ICD-10-CM

## 2021-10-09 MED ORDER — OXYCODONE HCL 10 MG PO TABS: 10.0000 mg | ORAL_TABLET | Freq: Three times a day (TID) | ORAL | 0 refills | Status: DC | PRN

## 2021-10-09 NOTE — Telephone Encounter (Addendum)
Return Mr [facility] Call, Oxycodone e-scribed today, instructed to start slow weaning, he verbalizes understanding.   Transitional Care call  Patient name: Carlos [name] DOB: [birth year] Are you/is patient experiencing any problems since coming home? No Are there any questions regarding any aspect of care? No Are there any questions regarding medications administration/dosing? No Are meds being taken as prescribed? Yes "Patient should review meds with caller to confirm"  Have there been any falls? No Has Home Health been to the house and/or have they contacted you? Has a scheduled appointment with [facility] If not, have you tried to contact them? NA Can we help you contact them? NA Are bowels and bladder emptying properly? Yes Are there any unexpected incontinence issues? No If applicable, is patient following bowel/bladder programs? NA Any fevers, problems with breathing, unexpected pain? No Are there any skin problems or new areas of breakdown? No Has the patient/family member arranged specialty MD follow up (ie cardiology/neurology/renal/surgical/etc.)?  [facility] appointments are schedulked with Dr Sammuel Hines and Dr Marcello Moores he reports.  Can we help arrange? NA Does the patient need any other services or support that we can help arrange? No Are caregivers following through as expected in assisting the patient? Yes Has the patient quit smoking, drinking alcohol, or using drugs as recommended? (                        )  Appointment date/time 10/17/2021  arrival time 9:00 for 9:20 appointment with Carlos [name] ANP-C. At [place]

## 2021-10-09 NOTE — Addendum Note (Signed)
Addended by: Bayard Hugger on: 10/09/2021 03:12 PM   Modules accepted: Orders

## 2021-10-09 NOTE — Telephone Encounter (Signed)
Patient is Carlos [name], called stating no one has contacted him since discharge. I confirmed his appointment 11/2 @ 920 he is running out of his Oxy and is in need of a refill.

## 2021-10-10 ENCOUNTER — Encounter: Payer: Self-pay | Admitting: Occupational Therapy

## 2021-10-10 ENCOUNTER — Ambulatory Visit: Payer: BC Managed Care – PPO | Attending: Physician Assistant | Admitting: Physical Therapy

## 2021-10-10 ENCOUNTER — Ambulatory Visit: Payer: BC Managed Care – PPO | Admitting: Occupational Therapy

## 2021-10-10 ENCOUNTER — Encounter: Payer: Self-pay | Admitting: Physical Therapy

## 2021-10-10 ENCOUNTER — Other Ambulatory Visit (HOSPITAL_COMMUNITY): Payer: Self-pay

## 2021-10-10 ENCOUNTER — Ambulatory Visit: Payer: Self-pay | Admitting: Physical Therapy

## 2021-10-10 ENCOUNTER — Other Ambulatory Visit: Payer: Self-pay

## 2021-10-10 DIAGNOSIS — M25612 Stiffness of left shoulder, not elsewhere classified: Secondary | ICD-10-CM

## 2021-10-10 DIAGNOSIS — M546 Pain in thoracic spine: Secondary | ICD-10-CM | POA: Diagnosis not present

## 2021-10-10 DIAGNOSIS — M25512 Pain in left shoulder: Secondary | ICD-10-CM | POA: Diagnosis not present

## 2021-10-10 DIAGNOSIS — R2681 Unsteadiness on feet: Secondary | ICD-10-CM | POA: Diagnosis not present

## 2021-10-10 DIAGNOSIS — G8254 Quadriplegia, C5-C7 incomplete: Secondary | ICD-10-CM | POA: Insufficient documentation

## 2021-10-10 DIAGNOSIS — R2689 Other abnormalities of gait and mobility: Secondary | ICD-10-CM | POA: Diagnosis not present

## 2021-10-10 DIAGNOSIS — M6281 Muscle weakness (generalized): Secondary | ICD-10-CM | POA: Insufficient documentation

## 2021-10-10 DIAGNOSIS — G8250 Quadriplegia, unspecified: Secondary | ICD-10-CM | POA: Diagnosis not present

## 2021-10-10 DIAGNOSIS — R208 Other disturbances of skin sensation: Secondary | ICD-10-CM | POA: Diagnosis not present

## 2021-10-10 DIAGNOSIS — M542 Cervicalgia: Secondary | ICD-10-CM | POA: Diagnosis not present

## 2021-10-10 DIAGNOSIS — R278 Other lack of coordination: Secondary | ICD-10-CM
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
## 2021-10-11 ENCOUNTER — Ambulatory Visit (HOSPITAL_BASED_OUTPATIENT_CLINIC_OR_DEPARTMENT_OTHER)
Admission: RE | Admit: 2021-10-11 | Discharge: 2021-10-11 | Disposition: A | Discharge status: Home / Self Care | Payer: BC Managed Care – PPO | Source: Ambulatory Visit | Attending: Orthopaedic Surgery | Admitting: Orthopaedic Surgery

## 2021-10-11 ENCOUNTER — Other Ambulatory Visit (HOSPITAL_COMMUNITY): Payer: Self-pay

## 2021-10-11 ENCOUNTER — Ambulatory Visit (INDEPENDENT_AMBULATORY_CARE_PROVIDER_SITE_OTHER): Payer: BC Managed Care – PPO | Admitting: Orthopaedic Surgery

## 2021-10-11 DIAGNOSIS — S42002D Fracture of unspecified part of left clavicle, subsequent encounter for fracture with routine healing: Secondary | ICD-10-CM | POA: Diagnosis not present

## 2021-10-11 DIAGNOSIS — M25512 Pain in left shoulder: Secondary | ICD-10-CM | POA: Diagnosis not present

## 2021-10-11 DIAGNOSIS — S42022A Displaced fracture of shaft of left clavicle, initial encounter for closed fracture: Secondary | ICD-10-CM | POA: Diagnosis not present
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
## 2021-10-12 DIAGNOSIS — S22052A Unstable burst fracture of T5-T6 vertebra, initial encounter for closed fracture: Secondary | ICD-10-CM | POA: Diagnosis not present

## 2021-10-12 DIAGNOSIS — S12400D Unspecified displaced fracture of fifth cervical vertebra, subsequent encounter for fracture with routine healing: Secondary | ICD-10-CM | POA: Diagnosis not present

## 2021-10-16 ENCOUNTER — Telehealth: Payer: Self-pay | Admitting: Orthopaedic Surgery

## 2021-10-16 ENCOUNTER — Telehealth: Payer: Self-pay

## 2021-10-16 NOTE — Telephone Encounter (Signed)
Patient called asked for a call back concerning his (PT) Patient said he is confused about getting his (PT) started at [facility]. The number to contact patient is [phone]

## 2021-10-16 NOTE — Telephone Encounter (Signed)
Spoke to patient directly about PT and messaged the PT at [place] services to give him a call for further questions.

## 2021-10-16 NOTE — Telephone Encounter (Signed)
Per PMP:   10/12/2021 10/12/2021 1  Oxycodone-Acetaminophen 10-325 50.00 12 [place] [facility] [id number] Nor ([id number]) 0/0 62.50 MME Private Pay [place]  10/09/2021 10/09/2021 2  Oxycodone Hcl (Ir) 10 Mg Tab 21.00 7 Eu Tho [id number] [facility] ([id number]) 0/0 45.00 MME Private Pay [facility]  10/03/2021 10/03/2021 3  Oxycodone Hcl (Ir) 10 Mg Tab 21.00 7 Da Ang [phone] Mos (5648) 0/0 45.00 MME Other [place]  Mr. Marchena called to state he will need a new Oxycodone script sent to the pharmacy.  Because the pharmacy on gave him the 7day supply.   Today he stated he has #42 tabs on hand.   After speaking with Mr. Spratlin he stated he will discuss the issue with you at his appointment tomorrow.  Call back phone # [phone]

## 2021-10-16 NOTE — Telephone Encounter (Signed)
LMOM returning call

## 2021-10-17 ENCOUNTER — Encounter: Payer: BC Managed Care – PPO | Admitting: Registered Nurse

## 2021-10-18 ENCOUNTER — Ambulatory Visit: Payer: Self-pay | Admitting: Physical Therapy

## 2021-10-18 ENCOUNTER — Encounter: Payer: Self-pay | Admitting: Occupational Therapy

## 2021-10-19 ENCOUNTER — Encounter: Payer: Self-pay | Admitting: Occupational Therapy

## 2021-10-19 ENCOUNTER — Other Ambulatory Visit: Payer: Self-pay

## 2021-10-19 ENCOUNTER — Ambulatory Visit: Payer: BC Managed Care – PPO | Admitting: Physical Therapy

## 2021-10-19 ENCOUNTER — Ambulatory Visit: Payer: BC Managed Care – PPO | Admitting: Occupational Therapy

## 2021-10-19 DIAGNOSIS — M546 Pain in thoracic spine: Secondary | ICD-10-CM | POA: Insufficient documentation

## 2021-10-19 DIAGNOSIS — Z9114 Patient's other noncompliance with medication regimen: Secondary | ICD-10-CM | POA: Diagnosis not present

## 2021-10-19 DIAGNOSIS — M6281 Muscle weakness (generalized): Secondary | ICD-10-CM | POA: Insufficient documentation

## 2021-10-19 DIAGNOSIS — G8250 Quadriplegia, unspecified: Secondary | ICD-10-CM

## 2021-10-19 DIAGNOSIS — R200 Anesthesia of skin: Secondary | ICD-10-CM | POA: Diagnosis not present

## 2021-10-19 DIAGNOSIS — R2681 Unsteadiness on feet: Secondary | ICD-10-CM

## 2021-10-19 DIAGNOSIS — G8254 Quadriplegia, C5-C7 incomplete: Secondary | ICD-10-CM | POA: Insufficient documentation

## 2021-10-19 DIAGNOSIS — M25612 Stiffness of left shoulder, not elsewhere classified: Secondary | ICD-10-CM | POA: Insufficient documentation

## 2021-10-19 DIAGNOSIS — R2689 Other abnormalities of gait and mobility: Secondary | ICD-10-CM | POA: Insufficient documentation

## 2021-10-19 DIAGNOSIS — R278 Other lack of coordination: Secondary | ICD-10-CM | POA: Insufficient documentation

## 2021-10-19 DIAGNOSIS — M542 Cervicalgia: Secondary | ICD-10-CM | POA: Insufficient documentation

## 2021-10-19 DIAGNOSIS — E119 Type 2 diabetes mellitus without complications: Secondary | ICD-10-CM | POA: Diagnosis not present

## 2021-10-19 DIAGNOSIS — Y92410 Unspecified street and highway as the place of occurrence of the external cause: Secondary | ICD-10-CM | POA: Diagnosis not present

## 2021-10-19 DIAGNOSIS — R208 Other disturbances of skin sensation: Secondary | ICD-10-CM | POA: Insufficient documentation

## 2021-10-19 DIAGNOSIS — T07XXXD Unspecified multiple injuries, subsequent encounter: Secondary | ICD-10-CM | POA: Diagnosis not present

## 2021-10-19 DIAGNOSIS — R269 Unspecified abnormalities of gait and mobility: Secondary | ICD-10-CM | POA: Diagnosis not present

## 2021-10-19 DIAGNOSIS — R110 Nausea: Secondary | ICD-10-CM | POA: Diagnosis not present

## 2021-10-19 DIAGNOSIS — M25512 Pain in left shoulder: Secondary | ICD-10-CM | POA: Insufficient documentation

## 2021-10-19 DIAGNOSIS — I700 Atherosclerosis of aorta: Secondary | ICD-10-CM | POA: Diagnosis not present

## 2021-10-19 DIAGNOSIS — K5900 Constipation, unspecified: Secondary | ICD-10-CM | POA: Diagnosis not present

## 2021-10-19 DIAGNOSIS — R450 Nervousness: Secondary | ICD-10-CM | POA: Diagnosis not present
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
## 2021-10-19 NOTE — Patient Instructions (Signed)
Ball exercises for Right hand coordination:   - rotate ball in finger tips both directions (R and L) and rotate it forwards and backwards  - ball toss from Right to Left hand  - ball toss in Right hand
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
## 2021-10-24 ENCOUNTER — Ambulatory Visit: Payer: BC Managed Care – PPO | Admitting: Occupational Therapy

## 2021-10-24 ENCOUNTER — Encounter: Payer: BC Managed Care – PPO | Attending: Registered Nurse | Admitting: Registered Nurse

## 2021-10-24 ENCOUNTER — Other Ambulatory Visit: Payer: Self-pay

## 2021-10-24 ENCOUNTER — Encounter: Payer: Self-pay | Admitting: Physical Therapy

## 2021-10-24 ENCOUNTER — Encounter: Payer: Self-pay | Admitting: Occupational Therapy

## 2021-10-24 ENCOUNTER — Ambulatory Visit: Payer: BC Managed Care – PPO | Admitting: Physical Therapy

## 2021-10-24 DIAGNOSIS — T07XXXA Unspecified multiple injuries, initial encounter: Secondary | ICD-10-CM | POA: Diagnosis not present

## 2021-10-24 DIAGNOSIS — R208 Other disturbances of skin sensation: Secondary | ICD-10-CM

## 2021-10-24 DIAGNOSIS — R269 Unspecified abnormalities of gait and mobility: Secondary | ICD-10-CM | POA: Insufficient documentation

## 2021-10-24 DIAGNOSIS — E119 Type 2 diabetes mellitus without complications: Secondary | ICD-10-CM | POA: Insufficient documentation

## 2021-10-24 DIAGNOSIS — I700 Atherosclerosis of aorta: Secondary | ICD-10-CM | POA: Diagnosis not present

## 2021-10-24 DIAGNOSIS — G8250 Quadriplegia, unspecified: Secondary | ICD-10-CM | POA: Insufficient documentation

## 2021-10-24 DIAGNOSIS — K5900 Constipation, unspecified: Secondary | ICD-10-CM | POA: Insufficient documentation

## 2021-10-24 DIAGNOSIS — M6281 Muscle weakness (generalized): Secondary | ICD-10-CM

## 2021-10-24 DIAGNOSIS — R2681 Unsteadiness on feet: Secondary | ICD-10-CM

## 2021-10-24 DIAGNOSIS — M25512 Pain in left shoulder: Secondary | ICD-10-CM

## 2021-10-24 DIAGNOSIS — T07XXXD Unspecified multiple injuries, subsequent encounter: Secondary | ICD-10-CM | POA: Insufficient documentation

## 2021-10-24 DIAGNOSIS — R450 Nervousness: Secondary | ICD-10-CM | POA: Insufficient documentation

## 2021-10-24 DIAGNOSIS — Y92410 Unspecified street and highway as the place of occurrence of the external cause: Secondary | ICD-10-CM | POA: Insufficient documentation

## 2021-10-24 DIAGNOSIS — Z9114 Patient's other noncompliance with medication regimen: Secondary | ICD-10-CM | POA: Insufficient documentation

## 2021-10-24 DIAGNOSIS — R200 Anesthesia of skin: Secondary | ICD-10-CM | POA: Insufficient documentation

## 2021-10-24 DIAGNOSIS — M546 Pain in thoracic spine: Secondary | ICD-10-CM

## 2021-10-24 DIAGNOSIS — R110 Nausea: Secondary | ICD-10-CM | POA: Diagnosis not present

## 2021-10-24 DIAGNOSIS — G8254 Quadriplegia, C5-C7 incomplete: Secondary | ICD-10-CM

## 2021-10-24 DIAGNOSIS — R278 Other lack of coordination: Secondary | ICD-10-CM

## 2021-10-24 DIAGNOSIS — M25612 Stiffness of left shoulder, not elsewhere classified: Secondary | ICD-10-CM

## 2021-10-24 DIAGNOSIS — M542 Cervicalgia: Secondary | ICD-10-CM

## 2021-10-24 DIAGNOSIS — R2689 Other abnormalities of gait and mobility: Secondary | ICD-10-CM
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
## 2021-10-25 ENCOUNTER — Encounter: Payer: Self-pay | Admitting: Occupational Therapy

## 2021-10-25 ENCOUNTER — Ambulatory Visit: Payer: BC Managed Care – PPO | Admitting: Physical Therapy

## 2021-10-26 ENCOUNTER — Encounter: Payer: Self-pay | Admitting: Registered Nurse

## 2021-10-26 ENCOUNTER — Other Ambulatory Visit: Payer: Self-pay

## 2021-10-26 ENCOUNTER — Ambulatory Visit (INDEPENDENT_AMBULATORY_CARE_PROVIDER_SITE_OTHER): Payer: BC Managed Care – PPO | Admitting: Endocrinology

## 2021-10-26 VITALS — BP 110/68 | HR 84 | Ht 76.0 in | Wt 137.2 lb

## 2021-10-26 DIAGNOSIS — E11649 Type 2 diabetes mellitus with hypoglycemia without coma: Secondary | ICD-10-CM | POA: Diagnosis not present

## 2021-10-26 LAB — POCT GLYCOSYLATED HEMOGLOBIN (HGB A1C): Hemoglobin A1C: 6.5 % — AB (ref 4.0–5.6)
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
## 2021-10-30 DIAGNOSIS — S129XXD Fracture of neck, unspecified, subsequent encounter: Secondary | ICD-10-CM | POA: Diagnosis not present

## 2021-10-30 DIAGNOSIS — Z681 Body mass index (BMI) 19 or less, adult: Secondary | ICD-10-CM | POA: Diagnosis not present

## 2021-10-30 DIAGNOSIS — Z1331 Encounter for screening for depression: Secondary | ICD-10-CM | POA: Diagnosis not present

## 2021-10-30 DIAGNOSIS — Z7689 Persons encountering health services in other specified circumstances: Secondary | ICD-10-CM | POA: Diagnosis not present

## 2021-10-31 ENCOUNTER — Ambulatory Visit: Payer: BC Managed Care – PPO | Admitting: Physical Therapy

## 2021-10-31 ENCOUNTER — Other Ambulatory Visit: Payer: Self-pay

## 2021-10-31 ENCOUNTER — Encounter: Payer: Self-pay | Admitting: Physical Therapy

## 2021-10-31 ENCOUNTER — Ambulatory Visit: Payer: BC Managed Care – PPO | Admitting: Occupational Therapy

## 2021-10-31 ENCOUNTER — Encounter: Payer: Self-pay | Admitting: Occupational Therapy

## 2021-10-31 DIAGNOSIS — R208 Other disturbances of skin sensation: Secondary | ICD-10-CM

## 2021-10-31 DIAGNOSIS — R110 Nausea: Secondary | ICD-10-CM | POA: Diagnosis not present

## 2021-10-31 DIAGNOSIS — I700 Atherosclerosis of aorta: Secondary | ICD-10-CM | POA: Diagnosis not present

## 2021-10-31 DIAGNOSIS — M542 Cervicalgia: Secondary | ICD-10-CM

## 2021-10-31 DIAGNOSIS — R2681 Unsteadiness on feet: Secondary | ICD-10-CM

## 2021-10-31 DIAGNOSIS — R450 Nervousness: Secondary | ICD-10-CM | POA: Diagnosis not present

## 2021-10-31 DIAGNOSIS — M546 Pain in thoracic spine: Secondary | ICD-10-CM

## 2021-10-31 DIAGNOSIS — G8250 Quadriplegia, unspecified: Secondary | ICD-10-CM | POA: Diagnosis not present

## 2021-10-31 DIAGNOSIS — Y92410 Unspecified street and highway as the place of occurrence of the external cause: Secondary | ICD-10-CM | POA: Diagnosis not present

## 2021-10-31 DIAGNOSIS — K5900 Constipation, unspecified: Secondary | ICD-10-CM | POA: Diagnosis not present

## 2021-10-31 DIAGNOSIS — M6281 Muscle weakness (generalized): Secondary | ICD-10-CM

## 2021-10-31 DIAGNOSIS — M25612 Stiffness of left shoulder, not elsewhere classified: Secondary | ICD-10-CM

## 2021-10-31 DIAGNOSIS — R269 Unspecified abnormalities of gait and mobility: Secondary | ICD-10-CM | POA: Diagnosis not present

## 2021-10-31 DIAGNOSIS — G8254 Quadriplegia, C5-C7 incomplete: Secondary | ICD-10-CM

## 2021-10-31 DIAGNOSIS — R2689 Other abnormalities of gait and mobility: Secondary | ICD-10-CM

## 2021-10-31 DIAGNOSIS — E119 Type 2 diabetes mellitus without complications: Secondary | ICD-10-CM | POA: Diagnosis not present

## 2021-10-31 DIAGNOSIS — T07XXXD Unspecified multiple injuries, subsequent encounter: Secondary | ICD-10-CM | POA: Diagnosis not present

## 2021-10-31 DIAGNOSIS — M25512 Pain in left shoulder: Secondary | ICD-10-CM

## 2021-10-31 DIAGNOSIS — R200 Anesthesia of skin: Secondary | ICD-10-CM | POA: Diagnosis not present

## 2021-10-31 DIAGNOSIS — Z9114 Patient's other noncompliance with medication regimen: Secondary | ICD-10-CM | POA: Diagnosis not present

## 2021-10-31 DIAGNOSIS — R278 Other lack of coordination: Secondary | ICD-10-CM
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
## 2021-10-31 NOTE — Patient Instructions (Signed)
Access Code: [email or web address] URL: [email or web address] Date: 10/31/2021 Prepared by: Grayling Congress  Exercises Gastroc Stretch on Wall - 1 x daily - 5 x weekly - 2 sets - 30 sec hold Standing Toe Taps - 1 x daily - 5 x weekly - 2 sets - 10 reps Tandem Walking with Counter Support - 1 x daily - 5 x weekly - 2 sets - 10 reps Squat with Chair Touch - 1 x daily - 5 x weekly - 2 sets - 10 reps Bridge with Heels on [employer] - 1 x daily - 5 x weekly - 2 sets - 10 reps Supine Hamstring Curl on Swiss Ball - 1 x daily - 5 x weekly - 2 sets - 10 reps Supine Dead Bug with Leg Extension - 1 x daily - 5 x weekly - 2 sets - 10 reps

## 2021-10-31 NOTE — Patient Instructions (Signed)
Shoulder exercises:  Standing: -slide rag up wall or mirror overhead, side to side, and diagonal.  Keep motion slow and controlled.  Pause for 2-3 seconds at end range before coming back to waist height.  Supine:  - "snow angel" while lying on bed with focus on increased shoulder range.  Keep arm on bed.     - chest press with 2-3#  raising from chest up to ceiling    - chest press towards ceiling and then reaching overhead, to pain tolerance.     - single hand chest press and overhead press (keep hand with thumb facing up).   Increase weight by using variety of household objects.

## 2021-11-02 ENCOUNTER — Ambulatory Visit: Payer: BC Managed Care – PPO | Admitting: Physical Therapy

## 2021-11-02 ENCOUNTER — Encounter: Payer: Self-pay | Admitting: Occupational Therapy

## 2021-11-02 ENCOUNTER — Encounter: Payer: Self-pay | Admitting: Physical Therapy

## 2021-11-02 ENCOUNTER — Other Ambulatory Visit: Payer: Self-pay

## 2021-11-02 ENCOUNTER — Ambulatory Visit: Payer: BC Managed Care – PPO | Admitting: Occupational Therapy

## 2021-11-02 DIAGNOSIS — M546 Pain in thoracic spine: Secondary | ICD-10-CM

## 2021-11-02 DIAGNOSIS — G8250 Quadriplegia, unspecified: Secondary | ICD-10-CM | POA: Diagnosis not present

## 2021-11-02 DIAGNOSIS — M542 Cervicalgia: Secondary | ICD-10-CM

## 2021-11-02 DIAGNOSIS — R2689 Other abnormalities of gait and mobility: Secondary | ICD-10-CM

## 2021-11-02 DIAGNOSIS — M6281 Muscle weakness (generalized): Secondary | ICD-10-CM

## 2021-11-02 DIAGNOSIS — Y92410 Unspecified street and highway as the place of occurrence of the external cause: Secondary | ICD-10-CM | POA: Diagnosis not present

## 2021-11-02 DIAGNOSIS — R269 Unspecified abnormalities of gait and mobility: Secondary | ICD-10-CM | POA: Diagnosis not present

## 2021-11-02 DIAGNOSIS — T07XXXD Unspecified multiple injuries, subsequent encounter: Secondary | ICD-10-CM | POA: Diagnosis not present

## 2021-11-02 DIAGNOSIS — Z9114 Patient's other noncompliance with medication regimen: Secondary | ICD-10-CM | POA: Diagnosis not present

## 2021-11-02 DIAGNOSIS — R450 Nervousness: Secondary | ICD-10-CM | POA: Diagnosis not present

## 2021-11-02 DIAGNOSIS — R2681 Unsteadiness on feet: Secondary | ICD-10-CM

## 2021-11-02 DIAGNOSIS — R200 Anesthesia of skin: Secondary | ICD-10-CM | POA: Diagnosis not present

## 2021-11-02 DIAGNOSIS — R208 Other disturbances of skin sensation: Secondary | ICD-10-CM

## 2021-11-02 DIAGNOSIS — R110 Nausea: Secondary | ICD-10-CM | POA: Diagnosis not present

## 2021-11-02 DIAGNOSIS — R278 Other lack of coordination: Secondary | ICD-10-CM

## 2021-11-02 DIAGNOSIS — E119 Type 2 diabetes mellitus without complications: Secondary | ICD-10-CM | POA: Diagnosis not present

## 2021-11-02 DIAGNOSIS — I700 Atherosclerosis of aorta: Secondary | ICD-10-CM | POA: Diagnosis not present

## 2021-11-02 DIAGNOSIS — K5900 Constipation, unspecified: Secondary | ICD-10-CM | POA: Diagnosis not present
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
## 2021-11-02 NOTE — Patient Instructions (Signed)
Access Code: [email or web address] URL: [email or web address] Date: 11/02/2021 Prepared by: Grayling Congress  Exercises Gastroc Stretch on Wall - 1 x daily - 5 x weekly - 2 sets - 30 sec hold Standing Toe Taps - 1 x daily - 5 x weekly - 2 sets - 10 reps Squat with Chair Touch - 1 x daily - 5 x weekly - 2 sets - 10 reps Bridge with Heels on [employer] - 1 x daily - 5 x weekly - 2 sets - 10 reps Supine Hamstring Curl on Swiss Ball - 1 x daily - 5 x weekly - 2 sets - 10 reps Supine Dead Bug with Leg Extension - 1 x daily - 5 x weekly - 2 sets - 10 reps Sidelying Hip Abduction - 1 x daily - 5 x weekly - 2 sets - 10 reps

## 2021-11-05 ENCOUNTER — Telehealth: Payer: Self-pay | Admitting: Orthopaedic Surgery

## 2021-11-05 NOTE — Telephone Encounter (Signed)
Pt called requesting a call back from [place]. Pt did not disclose his reasoning for calling. Please call pt at [phone].

## 2021-11-06 ENCOUNTER — Other Ambulatory Visit (HOSPITAL_BASED_OUTPATIENT_CLINIC_OR_DEPARTMENT_OTHER): Payer: Self-pay | Admitting: Orthopaedic Surgery

## 2021-11-06 ENCOUNTER — Telehealth (HOSPITAL_BASED_OUTPATIENT_CLINIC_OR_DEPARTMENT_OTHER): Payer: Self-pay | Admitting: Orthopaedic Surgery

## 2021-11-06 ENCOUNTER — Other Ambulatory Visit (HOSPITAL_BASED_OUTPATIENT_CLINIC_OR_DEPARTMENT_OTHER): Payer: Self-pay

## 2021-11-06 ENCOUNTER — Telehealth: Payer: Self-pay

## 2021-11-06 ENCOUNTER — Other Ambulatory Visit: Payer: Self-pay

## 2021-11-06 ENCOUNTER — Ambulatory Visit (INDEPENDENT_AMBULATORY_CARE_PROVIDER_SITE_OTHER): Payer: BC Managed Care – PPO | Admitting: Orthopaedic Surgery

## 2021-11-06 DIAGNOSIS — M25511 Pain in right shoulder: Secondary | ICD-10-CM

## 2021-11-06 DIAGNOSIS — M7502 Adhesive capsulitis of left shoulder: Secondary | ICD-10-CM | POA: Diagnosis not present

## 2021-11-06 MED ORDER — LIDOCAINE HCL 1 % IJ SOLN
4.0000 mL | INTRAMUSCULAR | Status: AC | PRN
  Administered 2021-11-06: 4 mL

## 2021-11-06 MED ORDER — TRIAMCINOLONE ACETONIDE 40 MG/ML IJ SUSP
80.0000 mg | INTRAMUSCULAR | Status: AC | PRN
  Administered 2021-11-06: 80 mg via INTRA_ARTICULAR

## 2021-11-06 MED ORDER — TRAMADOL HCL 50 MG PO TABS
50.0000 mg | ORAL_TABLET | Freq: Four times a day (QID) | ORAL | 0 refills | Status: DC | PRN
  Filled 2021-11-06: qty 28, 7d supply, fill #0

## 2021-11-06 MED ORDER — MELOXICAM 15 MG PO TABS: 15.0000 mg | ORAL_TABLET | Freq: Every day | ORAL | 0 refills | Status: DC
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
## 2021-11-06 NOTE — Telephone Encounter (Signed)
Spoke to patient directly

## 2021-11-06 NOTE — Telephone Encounter (Signed)
Patient is calling to request a refill of Alprazolam to [facility] [place], [place].

## 2021-11-07 ENCOUNTER — Ambulatory Visit: Payer: BC Managed Care – PPO | Admitting: Physical Therapy

## 2021-11-07 ENCOUNTER — Ambulatory Visit: Payer: BC Managed Care – PPO | Admitting: Occupational Therapy

## 2021-11-07 NOTE — Telephone Encounter (Signed)
Spoke with patient and explained that he would have to contact PCP to get further refills on Alprazolam per Dr. Dagoberto Ligas. Patient verbalized understanding.

## 2021-11-13 ENCOUNTER — Other Ambulatory Visit (HOSPITAL_BASED_OUTPATIENT_CLINIC_OR_DEPARTMENT_OTHER): Payer: Self-pay | Admitting: Orthopaedic Surgery

## 2021-11-13 ENCOUNTER — Other Ambulatory Visit (HOSPITAL_BASED_OUTPATIENT_CLINIC_OR_DEPARTMENT_OTHER): Payer: Self-pay

## 2021-11-13 MED ORDER — TRAMADOL HCL 50 MG PO TABS
50.0000 mg | ORAL_TABLET | Freq: Four times a day (QID) | ORAL | 0 refills | Status: DC | PRN
  Filled 2021-11-13: qty 30, 8d supply, fill #0

## 2021-11-14 ENCOUNTER — Encounter: Payer: Self-pay | Admitting: Physical Therapy

## 2021-11-14 ENCOUNTER — Other Ambulatory Visit: Payer: Self-pay

## 2021-11-14 ENCOUNTER — Ambulatory Visit: Payer: BC Managed Care – PPO | Admitting: Occupational Therapy

## 2021-11-14 ENCOUNTER — Ambulatory Visit: Payer: BC Managed Care – PPO | Admitting: Physical Therapy

## 2021-11-14 ENCOUNTER — Encounter (HOSPITAL_BASED_OUTPATIENT_CLINIC_OR_DEPARTMENT_OTHER): Payer: Self-pay | Admitting: Orthopaedic Surgery

## 2021-11-14 ENCOUNTER — Encounter: Payer: Self-pay | Admitting: Occupational Therapy

## 2021-11-14 DIAGNOSIS — M542 Cervicalgia: Secondary | ICD-10-CM

## 2021-11-14 DIAGNOSIS — R278 Other lack of coordination: Secondary | ICD-10-CM

## 2021-11-14 DIAGNOSIS — M546 Pain in thoracic spine: Secondary | ICD-10-CM

## 2021-11-14 DIAGNOSIS — G8250 Quadriplegia, unspecified: Secondary | ICD-10-CM | POA: Diagnosis not present

## 2021-11-14 DIAGNOSIS — R269 Unspecified abnormalities of gait and mobility: Secondary | ICD-10-CM | POA: Diagnosis not present

## 2021-11-14 DIAGNOSIS — M6281 Muscle weakness (generalized): Secondary | ICD-10-CM

## 2021-11-14 DIAGNOSIS — R200 Anesthesia of skin: Secondary | ICD-10-CM | POA: Diagnosis not present

## 2021-11-14 DIAGNOSIS — R2689 Other abnormalities of gait and mobility: Secondary | ICD-10-CM

## 2021-11-14 DIAGNOSIS — G8254 Quadriplegia, C5-C7 incomplete: Secondary | ICD-10-CM

## 2021-11-14 DIAGNOSIS — Z9114 Patient's other noncompliance with medication regimen: Secondary | ICD-10-CM | POA: Diagnosis not present

## 2021-11-14 DIAGNOSIS — R2681 Unsteadiness on feet: Secondary | ICD-10-CM

## 2021-11-14 DIAGNOSIS — R208 Other disturbances of skin sensation: Secondary | ICD-10-CM

## 2021-11-14 DIAGNOSIS — R110 Nausea: Secondary | ICD-10-CM | POA: Diagnosis not present

## 2021-11-14 DIAGNOSIS — T07XXXD Unspecified multiple injuries, subsequent encounter: Secondary | ICD-10-CM | POA: Diagnosis not present

## 2021-11-14 DIAGNOSIS — Y92410 Unspecified street and highway as the place of occurrence of the external cause: Secondary | ICD-10-CM | POA: Diagnosis not present

## 2021-11-14 DIAGNOSIS — E119 Type 2 diabetes mellitus without complications: Secondary | ICD-10-CM | POA: Diagnosis not present

## 2021-11-14 DIAGNOSIS — R450 Nervousness: Secondary | ICD-10-CM | POA: Diagnosis not present

## 2021-11-14 DIAGNOSIS — I700 Atherosclerosis of aorta: Secondary | ICD-10-CM | POA: Diagnosis not present

## 2021-11-14 DIAGNOSIS — K5900 Constipation, unspecified: Secondary | ICD-10-CM | POA: Diagnosis not present
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
## 2021-11-14 NOTE — Patient Instructions (Addendum)
  Coordination Activities  Perform the following activities for 5-10 minutes 1 times per day with right hand(s).  Deal cards with your thumb (Hold deck in hand and push card off top with thumb).  Make sure that you keep you 4th and 5th digits in contact with the cards. Shuffle cards. Flip cards one at at time, keeping whole hand in contact with card.
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
## 2021-11-15 ENCOUNTER — Ambulatory Visit (HOSPITAL_BASED_OUTPATIENT_CLINIC_OR_DEPARTMENT_OTHER): Payer: BC Managed Care – PPO | Admitting: Orthopaedic Surgery

## 2021-11-16 ENCOUNTER — Telehealth: Payer: Self-pay | Admitting: Physical Therapy

## 2021-11-16 ENCOUNTER — Other Ambulatory Visit: Payer: Self-pay

## 2021-11-16 ENCOUNTER — Ambulatory Visit: Payer: BC Managed Care – PPO | Admitting: Occupational Therapy

## 2021-11-16 ENCOUNTER — Ambulatory Visit: Payer: BC Managed Care – PPO | Attending: Physician Assistant | Admitting: Physical Therapy

## 2021-11-16 DIAGNOSIS — M6281 Muscle weakness (generalized): Secondary | ICD-10-CM

## 2021-11-16 DIAGNOSIS — R278 Other lack of coordination: Secondary | ICD-10-CM

## 2021-11-16 DIAGNOSIS — M542 Cervicalgia: Secondary | ICD-10-CM | POA: Diagnosis present

## 2021-11-16 DIAGNOSIS — R208 Other disturbances of skin sensation: Secondary | ICD-10-CM | POA: Insufficient documentation

## 2021-11-16 DIAGNOSIS — M546 Pain in thoracic spine: Secondary | ICD-10-CM | POA: Insufficient documentation

## 2021-11-16 DIAGNOSIS — R2689 Other abnormalities of gait and mobility: Secondary | ICD-10-CM | POA: Insufficient documentation

## 2021-11-16 DIAGNOSIS — R2681 Unsteadiness on feet: Secondary | ICD-10-CM | POA: Insufficient documentation

## 2021-11-16 DIAGNOSIS — M25512 Pain in left shoulder: Secondary | ICD-10-CM | POA: Diagnosis present

## 2021-11-16 DIAGNOSIS — G8254 Quadriplegia, C5-C7 incomplete: Secondary | ICD-10-CM | POA: Insufficient documentation

## 2021-11-16 DIAGNOSIS — M25612 Stiffness of left shoulder, not elsewhere classified: Secondary | ICD-10-CM | POA: Diagnosis present

## 2021-11-16 NOTE — Telephone Encounter (Signed)
Good morning, [name], Carlos [name]. Has presented to PT this week with reports of bump on R area of spine which has increased in soreness, rating pain 4-5/10, up to 8-9/10.  In the past few days, pt has reported increased pain even to palpation as well as curling of R 4th and 5th fingers and tingling in inner right thigh.  These symptoms are new/increased since 11/16/21.     Given increased discomfort at rest and with gentle standing exercises at beginning of session, PT is going to hold today.  Please advise of what patient needs to do next given new and worsening symptoms.  Thank you.    Mady Haagensen, PT 11/16/21 9:55 AM Phone: [phone] Fax: [phone]

## 2021-11-16 NOTE — Therapy (Signed)
[facility] [address] [place], [place], [address] Phone: [phone]   Fax:  [phone]  Patient Details  Name: Carlos [name]. MRN: [id number] Date of Birth: [birth year] Referring Provider:  Cathlyn Parsons, PA-C  Encounter Date: 11/16/2021  Pt arrived for OT session, however left early after PT session due to acute medical concerns.   Simonne Come, OT 11/16/2021, 10:56 AM  [facility] [address], [place], [place], [address] Phone: [phone]   Fax:  [phone]
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
## 2021-11-19 DIAGNOSIS — S12400D Unspecified displaced fracture of fifth cervical vertebra, subsequent encounter for fracture with routine healing: Secondary | ICD-10-CM | POA: Diagnosis not present

## 2021-11-19 DIAGNOSIS — S22052A Unstable burst fracture of T5-T6 vertebra, initial encounter for closed fracture: Secondary | ICD-10-CM | POA: Diagnosis not present

## 2021-11-20 ENCOUNTER — Other Ambulatory Visit: Payer: Self-pay

## 2021-11-20 ENCOUNTER — Ambulatory Visit (INDEPENDENT_AMBULATORY_CARE_PROVIDER_SITE_OTHER): Payer: BC Managed Care – PPO | Admitting: Orthopaedic Surgery

## 2021-11-20 ENCOUNTER — Other Ambulatory Visit (HOSPITAL_BASED_OUTPATIENT_CLINIC_OR_DEPARTMENT_OTHER): Payer: Self-pay

## 2021-11-20 DIAGNOSIS — G8929 Other chronic pain: Secondary | ICD-10-CM

## 2021-11-20 DIAGNOSIS — M25512 Pain in left shoulder: Secondary | ICD-10-CM | POA: Diagnosis not present

## 2021-11-20 DIAGNOSIS — M7502 Adhesive capsulitis of left shoulder: Secondary | ICD-10-CM

## 2021-11-20 MED ORDER — TRAMADOL HCL 50 MG PO TABS
50.0000 mg | ORAL_TABLET | Freq: Four times a day (QID) | ORAL | 0 refills | Status: DC | PRN
  Filled 2021-11-20: qty 30, 8d supply, fill #0
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
## 2021-11-21 ENCOUNTER — Ambulatory Visit (HOSPITAL_BASED_OUTPATIENT_CLINIC_OR_DEPARTMENT_OTHER): Payer: BC Managed Care – PPO | Admitting: Physical Therapy

## 2021-11-21 ENCOUNTER — Ambulatory Visit: Payer: BC Managed Care – PPO | Admitting: Occupational Therapy

## 2021-11-21 ENCOUNTER — Ambulatory Visit: Payer: BC Managed Care – PPO | Admitting: Physical Therapy

## 2021-11-23 ENCOUNTER — Ambulatory Visit: Payer: BC Managed Care – PPO | Admitting: Physical Therapy

## 2021-11-23 ENCOUNTER — Encounter: Payer: BC Managed Care – PPO | Admitting: Occupational Therapy

## 2021-11-26 ENCOUNTER — Ambulatory Visit: Payer: BC Managed Care – PPO | Admitting: Physical Therapy

## 2021-11-26 ENCOUNTER — Other Ambulatory Visit: Payer: Self-pay

## 2021-11-26 ENCOUNTER — Ambulatory Visit: Payer: BC Managed Care – PPO | Admitting: Occupational Therapy

## 2021-11-26 ENCOUNTER — Encounter: Payer: Self-pay | Admitting: Occupational Therapy

## 2021-11-26 ENCOUNTER — Ambulatory Visit
Admission: RE | Admit: 2021-11-26 | Discharge: 2021-11-26 | Disposition: A | Discharge status: Home / Self Care | Payer: BC Managed Care – PPO | Source: Ambulatory Visit | Attending: Orthopaedic Surgery | Admitting: Orthopaedic Surgery

## 2021-11-26 ENCOUNTER — Ambulatory Visit: Payer: Self-pay | Admitting: Physical Therapy

## 2021-11-26 DIAGNOSIS — G8929 Other chronic pain: Secondary | ICD-10-CM

## 2021-11-26 DIAGNOSIS — M25512 Pain in left shoulder: Secondary | ICD-10-CM

## 2021-11-26 DIAGNOSIS — R208 Other disturbances of skin sensation: Secondary | ICD-10-CM

## 2021-11-26 DIAGNOSIS — M6281 Muscle weakness (generalized): Secondary | ICD-10-CM

## 2021-11-26 DIAGNOSIS — M25612 Stiffness of left shoulder, not elsewhere classified: Secondary | ICD-10-CM

## 2021-11-26 DIAGNOSIS — R278 Other lack of coordination: Secondary | ICD-10-CM

## 2021-11-26 DIAGNOSIS — R2681 Unsteadiness on feet: Secondary | ICD-10-CM
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
## 2021-11-26 NOTE — Addendum Note (Signed)
Addended by: Kerrie Buffalo on: 11/26/2021 04:41 PM   Modules accepted: Orders

## 2021-11-28 ENCOUNTER — Other Ambulatory Visit (HOSPITAL_BASED_OUTPATIENT_CLINIC_OR_DEPARTMENT_OTHER): Payer: Self-pay | Admitting: Orthopaedic Surgery

## 2021-11-28 ENCOUNTER — Other Ambulatory Visit (HOSPITAL_BASED_OUTPATIENT_CLINIC_OR_DEPARTMENT_OTHER): Payer: Self-pay

## 2021-11-28 ENCOUNTER — Encounter: Payer: Self-pay | Admitting: Occupational Therapy

## 2021-11-28 ENCOUNTER — Ambulatory Visit: Payer: BC Managed Care – PPO | Admitting: Occupational Therapy

## 2021-11-28 ENCOUNTER — Ambulatory Visit: Payer: BC Managed Care – PPO | Admitting: Physical Therapy

## 2021-11-28 ENCOUNTER — Encounter (HOSPITAL_BASED_OUTPATIENT_CLINIC_OR_DEPARTMENT_OTHER): Payer: Self-pay | Admitting: Orthopaedic Surgery

## 2021-11-28 ENCOUNTER — Ambulatory Visit
Admission: RE | Admit: 2021-11-28 | Discharge: 2021-11-28 | Disposition: A | Discharge status: Home / Self Care | Payer: BC Managed Care – PPO | Source: Ambulatory Visit | Attending: Orthopaedic Surgery | Admitting: Orthopaedic Surgery

## 2021-11-28 ENCOUNTER — Other Ambulatory Visit: Payer: Self-pay

## 2021-11-28 ENCOUNTER — Encounter: Payer: Self-pay | Admitting: Physical Therapy

## 2021-11-28 DIAGNOSIS — R278 Other lack of coordination: Secondary | ICD-10-CM

## 2021-11-28 DIAGNOSIS — M6281 Muscle weakness (generalized): Secondary | ICD-10-CM | POA: Diagnosis not present

## 2021-11-28 DIAGNOSIS — M542 Cervicalgia: Secondary | ICD-10-CM

## 2021-11-28 DIAGNOSIS — M25612 Stiffness of left shoulder, not elsewhere classified: Secondary | ICD-10-CM

## 2021-11-28 DIAGNOSIS — R208 Other disturbances of skin sensation: Secondary | ICD-10-CM

## 2021-11-28 DIAGNOSIS — M546 Pain in thoracic spine: Secondary | ICD-10-CM

## 2021-11-28 DIAGNOSIS — M25512 Pain in left shoulder: Secondary | ICD-10-CM

## 2021-11-28 DIAGNOSIS — R2681 Unsteadiness on feet: Secondary | ICD-10-CM

## 2021-11-28 DIAGNOSIS — R2689 Other abnormalities of gait and mobility: Secondary | ICD-10-CM

## 2021-11-28 DIAGNOSIS — G8254 Quadriplegia, C5-C7 incomplete: Secondary | ICD-10-CM
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
## 2021-11-28 MED ORDER — TRAMADOL HCL 50 MG PO TABS
50.0000 mg | ORAL_TABLET | Freq: Four times a day (QID) | ORAL | 0 refills | Status: DC | PRN
  Filled 2021-11-28: qty 30, 8d supply, fill #0

## 2021-11-28 NOTE — Patient Instructions (Signed)
Access Code: [email or web address] URL: [email or web address] Date: 11/28/2021 Prepared by: [facility]  Exercises Standing Toe Taps - 1 x daily - 5 x weekly - 2 sets - 10 reps Squat with Chair Touch - 1 x daily - 5 x weekly - 2 sets - 10 reps Bridge with Heels on Swiss Ball - 1 x daily - 5 x weekly - 2 sets - 10 reps Supine Hamstring Curl on Swiss Ball - 1 x daily - 5 x weekly - 2 sets - 10 reps Supine Dead Bug with Leg Extension - 1 x daily - 5 x weekly - 2 sets - 10 reps Sidelying Hip Abduction - 1 x daily - 5 x weekly - 2 sets - 10 reps Backward Step Down - 1 x daily - 5 x weekly - 2 sets - 10 reps
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
## 2021-11-29 ENCOUNTER — Encounter (HOSPITAL_BASED_OUTPATIENT_CLINIC_OR_DEPARTMENT_OTHER): Payer: Self-pay | Admitting: Orthopaedic Surgery

## 2021-11-30 ENCOUNTER — Ambulatory Visit: Payer: BC Managed Care – PPO | Admitting: Physical Therapy

## 2021-11-30 ENCOUNTER — Ambulatory Visit: Payer: BC Managed Care – PPO | Admitting: Occupational Therapy

## 2021-12-01 ENCOUNTER — Encounter: Payer: Self-pay | Admitting: Endocrinology

## 2021-12-02 ENCOUNTER — Other Ambulatory Visit (HOSPITAL_BASED_OUTPATIENT_CLINIC_OR_DEPARTMENT_OTHER): Payer: Self-pay | Admitting: Orthopaedic Surgery

## 2021-12-02 DIAGNOSIS — G8250 Quadriplegia, unspecified: Secondary | ICD-10-CM | POA: Diagnosis not present

## 2021-12-03 ENCOUNTER — Encounter: Payer: Self-pay | Admitting: Physical Therapy

## 2021-12-03 ENCOUNTER — Ambulatory Visit: Payer: BC Managed Care – PPO | Admitting: Physical Therapy

## 2021-12-03 ENCOUNTER — Other Ambulatory Visit: Payer: Self-pay | Admitting: Endocrinology

## 2021-12-03 ENCOUNTER — Ambulatory Visit: Payer: BC Managed Care – PPO | Admitting: Occupational Therapy

## 2021-12-03 ENCOUNTER — Other Ambulatory Visit: Payer: Self-pay

## 2021-12-03 VITALS — BP 97/68 | HR 89

## 2021-12-03 DIAGNOSIS — M542 Cervicalgia: Secondary | ICD-10-CM

## 2021-12-03 DIAGNOSIS — M546 Pain in thoracic spine: Secondary | ICD-10-CM

## 2021-12-03 DIAGNOSIS — M6281 Muscle weakness (generalized): Secondary | ICD-10-CM | POA: Diagnosis not present

## 2021-12-03 DIAGNOSIS — G8254 Quadriplegia, C5-C7 incomplete: Secondary | ICD-10-CM

## 2021-12-03 DIAGNOSIS — R2689 Other abnormalities of gait and mobility: Secondary | ICD-10-CM

## 2021-12-03 MED ORDER — FREESTYLE LIBRE 2 SENSOR MISC: 1.0000 | 3 refills | Status: DC
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
## 2021-12-05 ENCOUNTER — Encounter: Payer: Self-pay | Admitting: Physical Therapy

## 2021-12-05 ENCOUNTER — Ambulatory Visit: Payer: BC Managed Care – PPO | Admitting: Occupational Therapy

## 2021-12-05 ENCOUNTER — Ambulatory Visit: Payer: BC Managed Care – PPO | Admitting: Physical Therapy

## 2021-12-05 ENCOUNTER — Other Ambulatory Visit: Payer: Self-pay

## 2021-12-05 ENCOUNTER — Encounter: Payer: Self-pay | Admitting: Occupational Therapy

## 2021-12-05 ENCOUNTER — Other Ambulatory Visit (HOSPITAL_BASED_OUTPATIENT_CLINIC_OR_DEPARTMENT_OTHER): Payer: Self-pay | Admitting: Orthopaedic Surgery

## 2021-12-05 DIAGNOSIS — R2689 Other abnormalities of gait and mobility: Secondary | ICD-10-CM

## 2021-12-05 DIAGNOSIS — M546 Pain in thoracic spine: Secondary | ICD-10-CM

## 2021-12-05 DIAGNOSIS — M25612 Stiffness of left shoulder, not elsewhere classified: Secondary | ICD-10-CM

## 2021-12-05 DIAGNOSIS — M6281 Muscle weakness (generalized): Secondary | ICD-10-CM

## 2021-12-05 DIAGNOSIS — M542 Cervicalgia: Secondary | ICD-10-CM

## 2021-12-05 DIAGNOSIS — R208 Other disturbances of skin sensation: Secondary | ICD-10-CM

## 2021-12-05 DIAGNOSIS — M25512 Pain in left shoulder: Secondary | ICD-10-CM

## 2021-12-05 DIAGNOSIS — G8254 Quadriplegia, C5-C7 incomplete: Secondary | ICD-10-CM

## 2021-12-05 DIAGNOSIS — R278 Other lack of coordination: Secondary | ICD-10-CM
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
## 2021-12-05 NOTE — Patient Instructions (Signed)
Wall exercises:   - wall pushups  - roll ball up/down wall  - roll ball center to Left and back to midline and to Right and back to midline  - bounce ball off wall (overhead)
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
## 2021-12-06 ENCOUNTER — Other Ambulatory Visit (HOSPITAL_BASED_OUTPATIENT_CLINIC_OR_DEPARTMENT_OTHER): Payer: Self-pay

## 2021-12-06 MED ORDER — TRAMADOL HCL 50 MG PO TABS
50.0000 mg | ORAL_TABLET | Freq: Four times a day (QID) | ORAL | 0 refills | Status: DC | PRN
  Filled 2021-12-06: qty 30, 8d supply, fill #0

## 2021-12-07 ENCOUNTER — Other Ambulatory Visit (HOSPITAL_BASED_OUTPATIENT_CLINIC_OR_DEPARTMENT_OTHER): Payer: Self-pay

## 2021-12-07 ENCOUNTER — Ambulatory Visit: Payer: BC Managed Care – PPO | Admitting: Physical Therapy

## 2021-12-07 ENCOUNTER — Encounter: Payer: BC Managed Care – PPO | Admitting: Occupational Therapy

## 2021-12-07 ENCOUNTER — Other Ambulatory Visit (HOSPITAL_BASED_OUTPATIENT_CLINIC_OR_DEPARTMENT_OTHER): Payer: Self-pay | Admitting: Orthopaedic Surgery

## 2021-12-10 MED ORDER — MELOXICAM 15 MG PO TABS: 15.0000 mg | ORAL_TABLET | Freq: Every day | ORAL | 0 refills | Status: DC

## 2021-12-11 ENCOUNTER — Ambulatory Visit (INDEPENDENT_AMBULATORY_CARE_PROVIDER_SITE_OTHER): Payer: BC Managed Care – PPO | Admitting: Orthopaedic Surgery

## 2021-12-11 ENCOUNTER — Other Ambulatory Visit (HOSPITAL_BASED_OUTPATIENT_CLINIC_OR_DEPARTMENT_OTHER): Payer: Self-pay | Admitting: Orthopaedic Surgery

## 2021-12-11 ENCOUNTER — Other Ambulatory Visit: Payer: Self-pay

## 2021-12-11 DIAGNOSIS — M25512 Pain in left shoulder: Secondary | ICD-10-CM

## 2021-12-11 DIAGNOSIS — G2589 Other specified extrapyramidal and movement disorders: Secondary | ICD-10-CM

## 2021-12-11 MED ORDER — LIDOCAINE HCL 1 % IJ SOLN
4.0000 mL | INTRAMUSCULAR | Status: AC | PRN
  Administered 2021-12-11: 09:00:00 4 mL

## 2021-12-11 MED ORDER — TRIAMCINOLONE ACETONIDE 40 MG/ML IJ SUSP
80.0000 mg | INTRAMUSCULAR | Status: AC | PRN
  Administered 2021-12-11: 09:00:00 80 mg via INTRA_ARTICULAR
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
## 2021-12-12 ENCOUNTER — Other Ambulatory Visit (HOSPITAL_BASED_OUTPATIENT_CLINIC_OR_DEPARTMENT_OTHER): Payer: Self-pay

## 2021-12-12 MED ORDER — TRAMADOL HCL 50 MG PO TABS
50.0000 mg | ORAL_TABLET | Freq: Four times a day (QID) | ORAL | 0 refills | Status: DC | PRN
  Filled 2021-12-12: qty 30, 7d supply, fill #0

## 2021-12-13 ENCOUNTER — Ambulatory Visit: Payer: BC Managed Care – PPO | Admitting: Physical Therapy

## 2021-12-13 ENCOUNTER — Telehealth (HOSPITAL_BASED_OUTPATIENT_CLINIC_OR_DEPARTMENT_OTHER): Payer: Self-pay | Admitting: Orthopaedic Surgery

## 2021-12-13 ENCOUNTER — Ambulatory Visit: Payer: BC Managed Care – PPO | Admitting: Occupational Therapy

## 2021-12-13 NOTE — Telephone Encounter (Signed)
Medication request.

## 2021-12-14 ENCOUNTER — Telehealth: Payer: Self-pay | Admitting: *Deleted

## 2021-12-14 ENCOUNTER — Encounter: Payer: BC Managed Care – PPO | Attending: Registered Nurse | Admitting: Physical Medicine and Rehabilitation

## 2021-12-14 ENCOUNTER — Other Ambulatory Visit (HOSPITAL_BASED_OUTPATIENT_CLINIC_OR_DEPARTMENT_OTHER): Payer: Self-pay | Admitting: Orthopaedic Surgery

## 2021-12-14 ENCOUNTER — Other Ambulatory Visit: Payer: Self-pay

## 2021-12-14 ENCOUNTER — Encounter: Payer: Self-pay | Admitting: Physical Medicine and Rehabilitation

## 2021-12-14 VITALS — BP 120/79 | HR 82 | Ht 76.0 in | Wt 133.0 lb

## 2021-12-14 DIAGNOSIS — I951 Orthostatic hypotension: Secondary | ICD-10-CM | POA: Insufficient documentation

## 2021-12-14 DIAGNOSIS — G8250 Quadriplegia, unspecified: Secondary | ICD-10-CM

## 2021-12-14 DIAGNOSIS — R252 Cramp and spasm: Secondary | ICD-10-CM | POA: Diagnosis not present

## 2021-12-14 DIAGNOSIS — R64 Cachexia: Secondary | ICD-10-CM

## 2021-12-14 DIAGNOSIS — N319 Neuromuscular dysfunction of bladder, unspecified: Secondary | ICD-10-CM | POA: Insufficient documentation

## 2021-12-14 DIAGNOSIS — E1165 Type 2 diabetes mellitus with hyperglycemia: Secondary | ICD-10-CM

## 2021-12-14 DIAGNOSIS — R269 Unspecified abnormalities of gait and mobility: Secondary | ICD-10-CM | POA: Diagnosis not present

## 2021-12-14 MED ORDER — OXYCODONE HCL 10 MG PO TABS: 10.0000 mg | ORAL_TABLET | Freq: Three times a day (TID) | ORAL | 0 refills | Status: DC | PRN

## 2021-12-14 MED ORDER — TRAMADOL HCL 50 MG PO TABS: 50.0000 mg | ORAL_TABLET | Freq: Four times a day (QID) | ORAL | 0 refills | Status: DC | PRN

## 2021-12-14 MED ORDER — MIRTAZAPINE 15 MG PO TABS: 15.0000 mg | ORAL_TABLET | Freq: Every day | ORAL | 5 refills | Status: DC
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
## 2021-12-14 NOTE — Telephone Encounter (Signed)
No- ml

## 2021-12-14 NOTE — Telephone Encounter (Signed)
Notified pharmacist Herbie [place].

## 2021-12-14 NOTE — Telephone Encounter (Signed)
Pharmacist called about Carlos [name] because he just filled 2 days ago oxycodone 5/325 #60 and now he has an rx we sent over. Was he supposed to get both?
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
## 2021-12-15 ENCOUNTER — Encounter: Payer: Self-pay | Admitting: Physical Medicine and Rehabilitation

## 2021-12-20 ENCOUNTER — Telehealth: Payer: Self-pay

## 2021-12-20 MED ORDER — OXYCODONE HCL 10 MG PO TABS: 10.0000 mg | ORAL_TABLET | Freq: Four times a day (QID) | ORAL | 0 refills | Status: DC | PRN

## 2021-12-20 NOTE — Telephone Encounter (Signed)
Mr. Marschall stated he will be out of pain medication today. Please advise.   Last appointment 12/14/21 Next appointment 03/15/22    Filled  Written  ID  Drug  QTY  Days  Prescriber  RX #  Dispenser  Refill  Daily Dose*  Pymt Type  PMP  12/14/2021 12/14/2021 2  Tramadol Hcl 50 Mg Tablet 30.00 7 [address] Bok [id number] Nor (2372) 0/0 21.43 MME Private Pay [place] 12/12/2021 12/12/2021 2  Oxycodone-Acetaminophen 5-325 60.00 10 [facility] Seamen [id number] Nor (2372) 0/0 45.00 MME Comm Ins [place] 12/12/2021 12/12/2021 6  Tramadol Hcl 50 Mg Tablet 30.00 7 St Bok [id number] Con (7708) 0/0 21.43 MME Other [place] 12/06/2021 12/06/2021 6  Tramadol Hcl 50 Mg Tablet 30.00 8 St Bok [id number] Con (7708) 0/0 18.75 MME Other [place] 12/02/2021 11/30/2021 2  Oxycodone-Acetaminophen 10-325 40.00 10 Hanley Seamen [id number] Nor (2372) 0/0 60.00 MME Private Pay [place] 11/28/2021 11/28/2021 6  Tramadol Hcl 50 Mg Tablet 30.00 8 St Bok [id number] Con (7708) 0/0 18.75 MME Other [place] 11/23/2021 11/23/2021 3  Oxycodone-Acetaminophen 10-325 40.00 10 Hanley Seamen [id number] Nor (2372) 0/0 60.00 MME Comm Ins [place] 11/20/2021 11/20/2021 6  Tramadol Hcl 50 Mg Tablet 30.00 8 St Bok [id number] Con (7708) 0/0 18.75 MME Other [place] 11/15/2021 11/14/2021 2  Oxycodone-Acetaminophen 10-325 40.00 10 Hanley Seamen [id number] Nor (2372) 0/0 60.00 MME Private Pay [place]

## 2021-12-20 NOTE — Addendum Note (Signed)
Addended by: Courtney Heys on: 12/20/2021 01:48 PM   Modules accepted: Orders

## 2021-12-21 ENCOUNTER — Telehealth: Payer: Self-pay

## 2021-12-21 MED ORDER — OXYCODONE HCL 10 MG PO TABS: 10.0000 mg | ORAL_TABLET | Freq: Four times a day (QID) | ORAL | 0 refills | Status: DC | PRN

## 2021-12-21 NOTE — Addendum Note (Signed)
Addended by: Courtney Heys on: 12/21/2021 09:25 AM   Modules accepted: Orders

## 2021-12-21 NOTE — Telephone Encounter (Signed)
Oxycodone canceled at [facility]

## 2021-12-24 ENCOUNTER — Encounter: Payer: Self-pay | Admitting: Endocrinology

## 2021-12-24 ENCOUNTER — Encounter: Payer: Self-pay | Admitting: Occupational Therapy

## 2021-12-24 ENCOUNTER — Ambulatory Visit (INDEPENDENT_AMBULATORY_CARE_PROVIDER_SITE_OTHER): Payer: No Typology Code available for payment source | Admitting: Endocrinology

## 2021-12-24 ENCOUNTER — Other Ambulatory Visit: Payer: Self-pay

## 2021-12-24 ENCOUNTER — Ambulatory Visit: Payer: No Typology Code available for payment source | Attending: Occupational Therapy | Admitting: Occupational Therapy

## 2021-12-24 VITALS — BP 130/90 | HR 91 | Ht 76.0 in | Wt 137.6 lb

## 2021-12-24 DIAGNOSIS — R2681 Unsteadiness on feet: Secondary | ICD-10-CM | POA: Diagnosis present

## 2021-12-24 DIAGNOSIS — R278 Other lack of coordination: Secondary | ICD-10-CM | POA: Diagnosis present

## 2021-12-24 DIAGNOSIS — M6281 Muscle weakness (generalized): Secondary | ICD-10-CM | POA: Diagnosis not present

## 2021-12-24 DIAGNOSIS — M25512 Pain in left shoulder: Secondary | ICD-10-CM | POA: Insufficient documentation

## 2021-12-24 DIAGNOSIS — E11649 Type 2 diabetes mellitus with hypoglycemia without coma: Secondary | ICD-10-CM

## 2021-12-24 DIAGNOSIS — R208 Other disturbances of skin sensation: Secondary | ICD-10-CM | POA: Insufficient documentation

## 2021-12-24 DIAGNOSIS — M25612 Stiffness of left shoulder, not elsewhere classified: Secondary | ICD-10-CM | POA: Insufficient documentation

## 2021-12-24 DIAGNOSIS — R7989 Other specified abnormal findings of blood chemistry: Secondary | ICD-10-CM | POA: Insufficient documentation

## 2021-12-24 LAB — LUTEINIZING HORMONE: LH: 0.14 m[IU]/mL — ABNORMAL LOW (ref 1.50–9.30)

## 2021-12-24 LAB — POCT GLYCOSYLATED HEMOGLOBIN (HGB A1C): Hemoglobin A1C: 6.9 % — AB (ref 4.0–5.6)

## 2021-12-24 LAB — FOLLICLE STIMULATING HORMONE: FSH: 0.5 m[IU]/mL — ABNORMAL LOW (ref 1.4–18.1)
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
## 2021-12-24 NOTE — Patient Instructions (Addendum)
check your blood sugar once a day.  vary the time of day when you check, between before the 3 meals, and at bedtime.  also check if you have symptoms of your blood sugar being too high or too low.  please keep a record of the readings and bring it to your next appointment here (or you can bring the meter itself).  You can write it on any piece of paper.  please call us sooner if your blood sugar goes below 70, or if most of your readings are over 200.  Blood tests are requested for you today.  We'll let you know about the results.     Please continue the same metformin and Jardiance.   Please come back for a follow-up appointment in 2 months.

## 2021-12-25 ENCOUNTER — Telehealth: Payer: Self-pay

## 2021-12-25 LAB — PROLACTIN: Prolactin: 41.9 ng/mL — ABNORMAL HIGH (ref 2.0–18.0)

## 2021-12-25 NOTE — Telephone Encounter (Signed)
LVM for pt to cb to the office with his questions/concerns with his labs for [facility]

## 2021-12-26 ENCOUNTER — Ambulatory Visit: Payer: No Typology Code available for payment source | Attending: Physician Assistant | Admitting: Physical Therapy

## 2021-12-26 ENCOUNTER — Other Ambulatory Visit: Payer: Self-pay

## 2021-12-26 ENCOUNTER — Encounter: Payer: Self-pay | Admitting: Physical Therapy

## 2021-12-26 ENCOUNTER — Ambulatory Visit: Payer: No Typology Code available for payment source | Admitting: Occupational Therapy

## 2021-12-26 DIAGNOSIS — R2689 Other abnormalities of gait and mobility: Secondary | ICD-10-CM | POA: Diagnosis present

## 2021-12-26 DIAGNOSIS — G8254 Quadriplegia, C5-C7 incomplete: Secondary | ICD-10-CM | POA: Insufficient documentation

## 2021-12-26 DIAGNOSIS — R278 Other lack of coordination: Secondary | ICD-10-CM | POA: Insufficient documentation

## 2021-12-26 DIAGNOSIS — M546 Pain in thoracic spine: Secondary | ICD-10-CM | POA: Insufficient documentation

## 2021-12-26 DIAGNOSIS — M25512 Pain in left shoulder: Secondary | ICD-10-CM | POA: Insufficient documentation

## 2021-12-26 DIAGNOSIS — M25612 Stiffness of left shoulder, not elsewhere classified: Secondary | ICD-10-CM | POA: Diagnosis present

## 2021-12-26 DIAGNOSIS — R208 Other disturbances of skin sensation: Secondary | ICD-10-CM | POA: Diagnosis present

## 2021-12-26 DIAGNOSIS — R2681 Unsteadiness on feet: Secondary | ICD-10-CM | POA: Insufficient documentation

## 2021-12-26 DIAGNOSIS — M6281 Muscle weakness (generalized): Secondary | ICD-10-CM | POA: Diagnosis present

## 2021-12-26 DIAGNOSIS — M542 Cervicalgia: Secondary | ICD-10-CM | POA: Diagnosis present

## 2021-12-26 LAB — TESTOSTERONE,FREE AND TOTAL
Testosterone, Free: 49.7 pg/mL — ABNORMAL HIGH (ref 6.8–21.5)
Testosterone: >1500 ng/dL — ABNORMAL HIGH (ref 264–916)
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
## 2021-12-31 ENCOUNTER — Encounter: Payer: Self-pay | Admitting: Occupational Therapy

## 2021-12-31 ENCOUNTER — Other Ambulatory Visit: Payer: Self-pay

## 2021-12-31 ENCOUNTER — Ambulatory Visit: Payer: No Typology Code available for payment source | Admitting: Occupational Therapy

## 2021-12-31 DIAGNOSIS — M25612 Stiffness of left shoulder, not elsewhere classified: Secondary | ICD-10-CM

## 2021-12-31 DIAGNOSIS — R278 Other lack of coordination: Secondary | ICD-10-CM

## 2021-12-31 DIAGNOSIS — R208 Other disturbances of skin sensation: Secondary | ICD-10-CM

## 2021-12-31 DIAGNOSIS — M25512 Pain in left shoulder: Secondary | ICD-10-CM

## 2021-12-31 DIAGNOSIS — R2681 Unsteadiness on feet: Secondary | ICD-10-CM

## 2021-12-31 DIAGNOSIS — M542 Cervicalgia: Secondary | ICD-10-CM | POA: Diagnosis not present

## 2021-12-31 DIAGNOSIS — M6281 Muscle weakness (generalized): Secondary | ICD-10-CM
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
## 2021-12-31 NOTE — Telephone Encounter (Signed)
LVN for pt to cb to the office with his questions/concerns with his [facility]

## 2022-01-02 ENCOUNTER — Other Ambulatory Visit (HOSPITAL_BASED_OUTPATIENT_CLINIC_OR_DEPARTMENT_OTHER): Payer: Self-pay | Admitting: Orthopaedic Surgery

## 2022-01-02 MED ORDER — TRAMADOL HCL 50 MG PO TABS: 50.0000 mg | ORAL_TABLET | Freq: Four times a day (QID) | ORAL | 0 refills | Status: DC | PRN

## 2022-01-03 ENCOUNTER — Ambulatory Visit: Payer: No Typology Code available for payment source | Admitting: Occupational Therapy

## 2022-01-03 ENCOUNTER — Encounter: Payer: Self-pay | Admitting: Occupational Therapy

## 2022-01-03 ENCOUNTER — Encounter: Payer: Self-pay | Admitting: Physical Therapy

## 2022-01-03 ENCOUNTER — Ambulatory Visit: Payer: No Typology Code available for payment source | Admitting: Physical Therapy

## 2022-01-03 ENCOUNTER — Other Ambulatory Visit: Payer: Self-pay

## 2022-01-03 DIAGNOSIS — R2681 Unsteadiness on feet: Secondary | ICD-10-CM

## 2022-01-03 DIAGNOSIS — G8254 Quadriplegia, C5-C7 incomplete: Secondary | ICD-10-CM

## 2022-01-03 DIAGNOSIS — M6281 Muscle weakness (generalized): Secondary | ICD-10-CM

## 2022-01-03 DIAGNOSIS — M542 Cervicalgia: Secondary | ICD-10-CM

## 2022-01-03 DIAGNOSIS — M546 Pain in thoracic spine: Secondary | ICD-10-CM

## 2022-01-03 DIAGNOSIS — M25512 Pain in left shoulder: Secondary | ICD-10-CM

## 2022-01-03 DIAGNOSIS — R278 Other lack of coordination: Secondary | ICD-10-CM

## 2022-01-03 DIAGNOSIS — M25612 Stiffness of left shoulder, not elsewhere classified: Secondary | ICD-10-CM

## 2022-01-03 DIAGNOSIS — R208 Other disturbances of skin sensation: Secondary | ICD-10-CM

## 2022-01-03 DIAGNOSIS — R2689 Other abnormalities of gait and mobility: Secondary | ICD-10-CM
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
## 2022-01-03 NOTE — Patient Instructions (Signed)

## 2022-01-05 ENCOUNTER — Other Ambulatory Visit: Payer: Self-pay | Admitting: Physical Medicine and Rehabilitation

## 2022-01-07 ENCOUNTER — Other Ambulatory Visit (HOSPITAL_BASED_OUTPATIENT_CLINIC_OR_DEPARTMENT_OTHER): Payer: Self-pay | Admitting: Orthopaedic Surgery

## 2022-01-07 ENCOUNTER — Encounter (HOSPITAL_BASED_OUTPATIENT_CLINIC_OR_DEPARTMENT_OTHER): Payer: Self-pay | Admitting: Orthopaedic Surgery

## 2022-01-07 ENCOUNTER — Ambulatory Visit: Payer: No Typology Code available for payment source | Admitting: Occupational Therapy

## 2022-01-07 MED ORDER — TRAMADOL HCL 50 MG PO TABS: 50.0000 mg | ORAL_TABLET | Freq: Four times a day (QID) | ORAL | 0 refills | Status: DC | PRN

## 2022-01-08 ENCOUNTER — Telehealth: Payer: Self-pay | Admitting: Orthopaedic Surgery

## 2022-01-08 ENCOUNTER — Telehealth: Payer: Self-pay

## 2022-01-08 ENCOUNTER — Other Ambulatory Visit (HOSPITAL_BASED_OUTPATIENT_CLINIC_OR_DEPARTMENT_OTHER): Payer: Self-pay | Admitting: Orthopaedic Surgery

## 2022-01-08 NOTE — Telephone Encounter (Signed)
[facility] called stating that they needed clarification on rx Ultram that was prescribed for patient. Pharmacist advised patient already getting #120 oxycodone on regular schedule prescribed by a [facility]. I advised pharmacist not to fill at this time until you both were notified. He stated he would put rx on hold and if you all wanted to proceed with filling ultram just call the pharmacy.

## 2022-01-08 NOTE — Telephone Encounter (Signed)
Patient called needing Rx refilled (Tramadol) Patient said the pharmacy do not have the Rx yet. The number to contact patient is [phone]

## 2022-01-09 ENCOUNTER — Telehealth: Payer: Self-pay

## 2022-01-09 ENCOUNTER — Ambulatory Visit: Payer: No Typology Code available for payment source | Admitting: Physical Therapy

## 2022-01-09 NOTE — Telephone Encounter (Signed)
Patient is requesting a prescription for Methocarbamol as he is in between PCPs. [facility]

## 2022-01-10 MED ORDER — METHOCARBAMOL 500 MG PO TABS: 1000.0000 mg | ORAL_TABLET | Freq: Three times a day (TID) | ORAL | 0 refills | Status: DC

## 2022-01-10 NOTE — Telephone Encounter (Signed)
Patient notified

## 2022-01-14 ENCOUNTER — Encounter: Payer: Self-pay | Admitting: Occupational Therapy

## 2022-01-14 ENCOUNTER — Ambulatory Visit: Payer: No Typology Code available for payment source | Admitting: Occupational Therapy

## 2022-01-14 ENCOUNTER — Other Ambulatory Visit: Payer: Self-pay

## 2022-01-14 DIAGNOSIS — M25612 Stiffness of left shoulder, not elsewhere classified: Secondary | ICD-10-CM

## 2022-01-14 DIAGNOSIS — R2681 Unsteadiness on feet: Secondary | ICD-10-CM

## 2022-01-14 DIAGNOSIS — M25512 Pain in left shoulder: Secondary | ICD-10-CM

## 2022-01-14 DIAGNOSIS — R278 Other lack of coordination: Secondary | ICD-10-CM

## 2022-01-14 DIAGNOSIS — M6281 Muscle weakness (generalized): Secondary | ICD-10-CM

## 2022-01-14 DIAGNOSIS — R208 Other disturbances of skin sensation: Secondary | ICD-10-CM
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
## 2022-01-15 ENCOUNTER — Encounter: Payer: Self-pay | Admitting: Physical Therapy

## 2022-01-15 ENCOUNTER — Ambulatory Visit: Payer: No Typology Code available for payment source | Admitting: Physical Therapy

## 2022-01-15 ENCOUNTER — Other Ambulatory Visit: Payer: Self-pay

## 2022-01-15 DIAGNOSIS — G8254 Quadriplegia, C5-C7 incomplete: Secondary | ICD-10-CM

## 2022-01-15 DIAGNOSIS — M546 Pain in thoracic spine: Secondary | ICD-10-CM

## 2022-01-15 DIAGNOSIS — R2689 Other abnormalities of gait and mobility: Secondary | ICD-10-CM

## 2022-01-15 DIAGNOSIS — M542 Cervicalgia: Secondary | ICD-10-CM

## 2022-01-15 NOTE — Patient Instructions (Signed)
Access Code: [email or web address] URL: [email or web address] Date: 01/15/2022 Prepared by: [facility]  Exercises Sidelying Open Book Thoracic Lumbar Rotation and Extension - 1 x daily - 5 x weekly - 2 sets - 10 reps Seated Thoracic Lumbar Extension with Pectoralis Stretch - 1 x daily - 5 x weekly - 2 sets - 10 reps - 3 sec hold Seated Assisted Cervical Rotation with Towel - 1 x daily - 5 x weekly - 2 sets - 10 reps Mid-Lower Cervical Extension SNAG with Strap - 1 x daily - 5 x weekly - 2 sets - 10 reps Seated Upper Trapezius Stretch - 1 x daily - 5 x weekly - 2 sets - 30 sec hold Hip Abduction with Resistance Loop - 1 x daily - 5 x weekly - 2 sets - 10 reps Side Stepping with Resistance at Ankles and Counter Support - 1 x daily - 5 x weekly - 2 sets - 10 reps
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
## 2022-01-17 ENCOUNTER — Telehealth: Payer: Self-pay

## 2022-01-17 ENCOUNTER — Ambulatory Visit (INDEPENDENT_AMBULATORY_CARE_PROVIDER_SITE_OTHER): Payer: No Typology Code available for payment source | Admitting: Nurse Practitioner

## 2022-01-17 ENCOUNTER — Other Ambulatory Visit: Payer: Self-pay

## 2022-01-17 ENCOUNTER — Encounter: Payer: Self-pay | Admitting: Nurse Practitioner

## 2022-01-17 ENCOUNTER — Encounter: Payer: Self-pay | Admitting: Physical Medicine and Rehabilitation

## 2022-01-17 DIAGNOSIS — Z7689 Persons encountering health services in other specified circumstances: Secondary | ICD-10-CM

## 2022-01-17 MED ORDER — OXYCODONE HCL 10 MG PO TABS: 10.0000 mg | ORAL_TABLET | Freq: Four times a day (QID) | ORAL | 0 refills | Status: DC | PRN

## 2022-01-17 MED ORDER — MELOXICAM 15 MG PO TABS: 15.0000 mg | ORAL_TABLET | Freq: Every day | ORAL | 0 refills | Status: DC

## 2022-01-17 MED ORDER — TAMSULOSIN HCL 0.4 MG PO CAPS: 0.4000 mg | ORAL_CAPSULE | Freq: Every day | ORAL | 0 refills | Status: DC

## 2022-01-17 MED ORDER — PAROXETINE HCL 20 MG PO TABS: 20.0000 mg | ORAL_TABLET | Freq: Every day | ORAL | 0 refills | Status: DC

## 2022-01-17 NOTE — Telephone Encounter (Signed)
Patient called and stated [facility]. Prescription at [facility] has been cancelled

## 2022-01-17 NOTE — Patient Instructions (Addendum)
Encounter to establish care Medication refills:  Will refill Mobic, paxil, and flomax per patient request  Continue current medications  Stay as active as possible  Heart healthy diabetic diet  Follow up:   Follow up with Dr. Redmond Pulling in 4-6 weeks

## 2022-01-17 NOTE — Telephone Encounter (Signed)
ERROR
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
## 2022-01-17 NOTE — Telephone Encounter (Signed)
Calling to get a refill on oxycodone last filled per pmp 12/21/21 . Has an upcoming appt in March .

## 2022-01-18 MED ORDER — OXYCODONE HCL 10 MG PO TABS: 10.0000 mg | ORAL_TABLET | Freq: Four times a day (QID) | ORAL | 0 refills | Status: DC | PRN

## 2022-01-18 NOTE — Telephone Encounter (Signed)
[facility]

## 2022-01-18 NOTE — Addendum Note (Signed)
Addended by: Courtney Heys on: 01/18/2022 09:19 AM   Modules accepted: Orders

## 2022-01-21 ENCOUNTER — Ambulatory Visit (INDEPENDENT_AMBULATORY_CARE_PROVIDER_SITE_OTHER): Payer: No Typology Code available for payment source | Admitting: Orthopaedic Surgery

## 2022-01-21 ENCOUNTER — Other Ambulatory Visit: Payer: Self-pay

## 2022-01-21 DIAGNOSIS — G2589 Other specified extrapyramidal and movement disorders: Secondary | ICD-10-CM | POA: Diagnosis not present
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
## 2022-01-23 ENCOUNTER — Encounter: Payer: Self-pay | Admitting: Occupational Therapy

## 2022-01-23 ENCOUNTER — Other Ambulatory Visit: Payer: Self-pay

## 2022-01-23 ENCOUNTER — Ambulatory Visit: Payer: No Typology Code available for payment source | Attending: Physician Assistant | Admitting: Occupational Therapy

## 2022-01-23 DIAGNOSIS — R278 Other lack of coordination: Secondary | ICD-10-CM | POA: Insufficient documentation

## 2022-01-23 DIAGNOSIS — R2681 Unsteadiness on feet: Secondary | ICD-10-CM | POA: Diagnosis present

## 2022-01-23 DIAGNOSIS — M25512 Pain in left shoulder: Secondary | ICD-10-CM | POA: Insufficient documentation

## 2022-01-23 DIAGNOSIS — M6281 Muscle weakness (generalized): Secondary | ICD-10-CM | POA: Insufficient documentation

## 2022-01-23 DIAGNOSIS — M25612 Stiffness of left shoulder, not elsewhere classified: Secondary | ICD-10-CM | POA: Diagnosis present

## 2022-01-23 DIAGNOSIS — R208 Other disturbances of skin sensation: Secondary | ICD-10-CM | POA: Diagnosis present

## 2022-01-23 NOTE — Patient Instructions (Signed)
Access Code: [email or web address] URL: [email or web address] Date: 01/23/2022 Prepared by: [facility]  Exercises Supine Chest Stretch on Foam Roll - 1 x daily - 3 x weekly - 2 sets - 10 reps Open Book Chest Rotation Stretch on Foam 1/2 Roll - 1 x daily - 3 x weekly - 2 sets - 10 reps Prone Rhomboid Strengthening - 1 x daily - 3 x weekly - 2 sets - 10 reps Single Arm Pec Stretch at [place] - 1 x daily - 3 x weekly - 2 sets - 10 reps Scaption Wall Slide with Towel - 1 x daily - 3 x weekly - 2 sets - 10 reps Scapular Wall Slides - 1 x daily - 3 x weekly - 2 sets - 10 reps Shoulder External Rotation and Scapular Retraction with Resistance - 1 x daily - 3 x weekly - 2 sets - 10 reps Standing Low Trap Setting with Resistance at [place]  - 1 x daily - 3 x weekly - 2 sets - 10 reps Seated Elbow Extension with Resistance - 1 x daily - 3 x weekly - 2 sets - 10 reps
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
## 2022-01-29 ENCOUNTER — Encounter: Payer: Self-pay | Admitting: Endocrinology

## 2022-01-29 DIAGNOSIS — E11649 Type 2 diabetes mellitus with hypoglycemia without coma: Secondary | ICD-10-CM

## 2022-01-29 MED ORDER — METFORMIN HCL 1000 MG PO TABS: 1000.0000 mg | ORAL_TABLET | Freq: Two times a day (BID) | ORAL | 0 refills | Status: DC

## 2022-01-29 MED ORDER — EMPAGLIFLOZIN 10 MG PO TABS: 10.0000 mg | ORAL_TABLET | Freq: Every day | ORAL | 0 refills | Status: DC

## 2022-01-31 ENCOUNTER — Telehealth: Payer: Self-pay

## 2022-01-31 ENCOUNTER — Other Ambulatory Visit: Payer: Self-pay | Admitting: Endocrinology

## 2022-01-31 DIAGNOSIS — E11649 Type 2 diabetes mellitus with hypoglycemia without coma: Secondary | ICD-10-CM

## 2022-01-31 NOTE — Telephone Encounter (Signed)
° °  Rx #: [id number]  gabapentin (NEURONTIN) 100 MG capsule [[id number]]   tamsulosin (FLOMAX) 0.4 MG CAPS capsule [[id number]]   Pharmacy  [facility] ([facility] ) - [place], [place]  [address] [place] [address]  Phone:  [phone]  Fax:  [phone]

## 2022-02-01 ENCOUNTER — Other Ambulatory Visit: Payer: Self-pay

## 2022-02-01 ENCOUNTER — Ambulatory Visit: Payer: No Typology Code available for payment source | Admitting: Occupational Therapy

## 2022-02-01 ENCOUNTER — Other Ambulatory Visit: Payer: Self-pay | Admitting: Nurse Practitioner

## 2022-02-01 ENCOUNTER — Encounter: Payer: Self-pay | Admitting: Occupational Therapy

## 2022-02-01 DIAGNOSIS — R208 Other disturbances of skin sensation: Secondary | ICD-10-CM

## 2022-02-01 DIAGNOSIS — M6281 Muscle weakness (generalized): Secondary | ICD-10-CM

## 2022-02-01 DIAGNOSIS — R278 Other lack of coordination: Secondary | ICD-10-CM

## 2022-02-01 DIAGNOSIS — M25512 Pain in left shoulder: Secondary | ICD-10-CM

## 2022-02-01 DIAGNOSIS — M25612 Stiffness of left shoulder, not elsewhere classified: Secondary | ICD-10-CM

## 2022-02-01 DIAGNOSIS — R2681 Unsteadiness on feet: Secondary | ICD-10-CM

## 2022-02-01 MED ORDER — TAMSULOSIN HCL 0.4 MG PO CAPS: 0.4000 mg | ORAL_CAPSULE | Freq: Every day | ORAL | 0 refills | Status: DC

## 2022-02-01 MED ORDER — GABAPENTIN 100 MG PO CAPS: 200.0000 mg | ORAL_CAPSULE | Freq: Three times a day (TID) | ORAL | 0 refills | Status: DC
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
## 2022-02-04 ENCOUNTER — Telehealth: Payer: Self-pay

## 2022-02-04 ENCOUNTER — Ambulatory Visit: Payer: No Typology Code available for payment source | Admitting: Occupational Therapy

## 2022-02-04 DIAGNOSIS — M25612 Stiffness of left shoulder, not elsewhere classified: Secondary | ICD-10-CM

## 2022-02-04 DIAGNOSIS — R278 Other lack of coordination: Secondary | ICD-10-CM

## 2022-02-04 DIAGNOSIS — M6281 Muscle weakness (generalized): Secondary | ICD-10-CM | POA: Diagnosis not present

## 2022-02-04 DIAGNOSIS — M25512 Pain in left shoulder: Secondary | ICD-10-CM

## 2022-02-04 DIAGNOSIS — R2681 Unsteadiness on feet: Secondary | ICD-10-CM

## 2022-02-04 DIAGNOSIS — R208 Other disturbances of skin sensation: Secondary | ICD-10-CM

## 2022-02-04 NOTE — Telephone Encounter (Signed)
Could you please check on this. I sent these medications in on last visit - I think he might be wanting to switch pharmacies?

## 2022-02-04 NOTE — Telephone Encounter (Signed)
ALPRAZolam Duanne Moron) tablet 0.25 mg   [[id number]]   [facility] [address], [place], [place] [address]

## 2022-02-05 ENCOUNTER — Encounter: Payer: Self-pay | Admitting: Occupational Therapy

## 2022-02-05 ENCOUNTER — Other Ambulatory Visit: Payer: Self-pay

## 2022-02-05 NOTE — Telephone Encounter (Signed)
Patient states someone spoke to him about the medications on yesterday.

## 2022-02-05 NOTE — Telephone Encounter (Signed)
Patient was informed because he has not had an OV with Dr. Redmond Pulling, would not be able to have Rx sent to pharmacy. He can address at [facility] on 02/28/2022.
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
## 2022-02-05 NOTE — Therapy (Deleted)
[facility] [address] [place], [place], [address] Phone: [phone]   Fax:  [phone]  Patient Details  Name: Carlos [name]. MRN: [id number] Date of Birth: June 01, 1971 Referring Provider:  No ref. provider found  Encounter Date: 02/04/2022   Mariah Milling, OT 02/05/2022, 9:10 AM  [facility] [address] [place], [place], [address] Phone: [phone]   Fax:  [phone]

## 2022-02-08 ENCOUNTER — Telehealth: Payer: Self-pay | Admitting: Physical Medicine and Rehabilitation

## 2022-02-08 ENCOUNTER — Other Ambulatory Visit: Payer: Self-pay

## 2022-02-08 ENCOUNTER — Ambulatory Visit: Payer: No Typology Code available for payment source | Admitting: Occupational Therapy

## 2022-02-08 DIAGNOSIS — M6281 Muscle weakness (generalized): Secondary | ICD-10-CM

## 2022-02-08 DIAGNOSIS — R208 Other disturbances of skin sensation: Secondary | ICD-10-CM

## 2022-02-08 DIAGNOSIS — R2681 Unsteadiness on feet: Secondary | ICD-10-CM

## 2022-02-08 DIAGNOSIS — R278 Other lack of coordination: Secondary | ICD-10-CM

## 2022-02-08 DIAGNOSIS — M25612 Stiffness of left shoulder, not elsewhere classified: Secondary | ICD-10-CM

## 2022-02-08 NOTE — Telephone Encounter (Signed)
Patient wanted to know if you could start him on Baclofen.  He stated that you guys had spoke about it on last office visit.  He is having spasticity issues with his hands.

## 2022-02-08 NOTE — Telephone Encounter (Signed)
Please advise. Patient wanted to know if you could start him on Baclofen. He stated that it was discussed in the last office visit.  He is having spasticity issues with his hands.
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
## 2022-02-11 ENCOUNTER — Other Ambulatory Visit: Payer: Self-pay

## 2022-02-11 ENCOUNTER — Ambulatory Visit: Payer: No Typology Code available for payment source | Admitting: Occupational Therapy

## 2022-02-11 ENCOUNTER — Encounter: Payer: Self-pay | Admitting: Occupational Therapy

## 2022-02-11 DIAGNOSIS — M25512 Pain in left shoulder: Secondary | ICD-10-CM

## 2022-02-11 DIAGNOSIS — M6281 Muscle weakness (generalized): Secondary | ICD-10-CM | POA: Diagnosis not present

## 2022-02-11 DIAGNOSIS — M25612 Stiffness of left shoulder, not elsewhere classified: Secondary | ICD-10-CM

## 2022-02-11 DIAGNOSIS — R2681 Unsteadiness on feet: Secondary | ICD-10-CM

## 2022-02-11 DIAGNOSIS — R208 Other disturbances of skin sensation: Secondary | ICD-10-CM

## 2022-02-11 DIAGNOSIS — R278 Other lack of coordination: Secondary | ICD-10-CM

## 2022-02-11 MED ORDER — BACLOFEN 10 MG PO TABS: 5.0000 mg | ORAL_TABLET | Freq: Three times a day (TID) | ORAL | 5 refills | Status: DC

## 2022-02-11 MED ORDER — OXYCODONE HCL 10 MG PO TABS: 10.0000 mg | ORAL_TABLET | Freq: Four times a day (QID) | ORAL | 0 refills | Status: DC | PRN
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
## 2022-02-13 ENCOUNTER — Encounter: Payer: Self-pay | Admitting: Occupational Therapy

## 2022-02-13 ENCOUNTER — Ambulatory Visit: Payer: No Typology Code available for payment source | Attending: Physician Assistant | Admitting: Occupational Therapy

## 2022-02-13 ENCOUNTER — Other Ambulatory Visit: Payer: Self-pay

## 2022-02-13 DIAGNOSIS — R208 Other disturbances of skin sensation: Secondary | ICD-10-CM | POA: Insufficient documentation

## 2022-02-13 DIAGNOSIS — R2681 Unsteadiness on feet: Secondary | ICD-10-CM | POA: Insufficient documentation

## 2022-02-13 DIAGNOSIS — M6281 Muscle weakness (generalized): Secondary | ICD-10-CM | POA: Insufficient documentation

## 2022-02-13 DIAGNOSIS — R278 Other lack of coordination: Secondary | ICD-10-CM | POA: Diagnosis present

## 2022-02-13 DIAGNOSIS — M25512 Pain in left shoulder: Secondary | ICD-10-CM | POA: Insufficient documentation

## 2022-02-13 DIAGNOSIS — M25612 Stiffness of left shoulder, not elsewhere classified: Secondary | ICD-10-CM | POA: Diagnosis present
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
## 2022-02-18 ENCOUNTER — Ambulatory Visit: Payer: Self-pay | Admitting: Occupational Therapy

## 2022-02-20 ENCOUNTER — Ambulatory Visit: Payer: No Typology Code available for payment source | Admitting: Occupational Therapy

## 2022-02-20 ENCOUNTER — Other Ambulatory Visit: Payer: Self-pay

## 2022-02-20 ENCOUNTER — Encounter: Payer: Self-pay | Admitting: Occupational Therapy

## 2022-02-20 DIAGNOSIS — M6281 Muscle weakness (generalized): Secondary | ICD-10-CM

## 2022-02-20 DIAGNOSIS — R278 Other lack of coordination: Secondary | ICD-10-CM

## 2022-02-20 DIAGNOSIS — M25512 Pain in left shoulder: Secondary | ICD-10-CM

## 2022-02-20 DIAGNOSIS — M25612 Stiffness of left shoulder, not elsewhere classified: Secondary | ICD-10-CM
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
## 2022-02-23 ENCOUNTER — Other Ambulatory Visit: Payer: Self-pay | Admitting: Nurse Practitioner

## 2022-02-27 ENCOUNTER — Other Ambulatory Visit: Payer: Self-pay

## 2022-02-27 ENCOUNTER — Encounter: Payer: Self-pay | Admitting: Endocrinology

## 2022-02-27 ENCOUNTER — Ambulatory Visit (INDEPENDENT_AMBULATORY_CARE_PROVIDER_SITE_OTHER): Payer: No Typology Code available for payment source | Admitting: Endocrinology

## 2022-02-27 VITALS — BP 106/78 | HR 74 | Ht 76.0 in | Wt 147.4 lb

## 2022-02-27 DIAGNOSIS — E11649 Type 2 diabetes mellitus with hypoglycemia without coma: Secondary | ICD-10-CM

## 2022-02-27 LAB — POCT GLYCOSYLATED HEMOGLOBIN (HGB A1C): Hemoglobin A1C: 8.2 % — AB (ref 4.0–5.6)
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
## 2022-02-27 NOTE — Patient Instructions (Addendum)
check your blood sugar once a day.  vary the time of day when you check, between before the 3 meals, and at bedtime.  also check if you have symptoms of your blood sugar being too high or too low.  please keep a record of the readings and bring it to your next appointment here (or you can bring the meter itself).  You can write it on any piece of paper.  please call us sooner if your blood sugar goes below 70, or if most of your readings are over 200.   ?Please continue the same metformin and Jardiance.   ?Please come back for a follow-up appointment in 3-4 months.   ? ? ?

## 2022-02-28 ENCOUNTER — Ambulatory Visit (INDEPENDENT_AMBULATORY_CARE_PROVIDER_SITE_OTHER): Payer: No Typology Code available for payment source | Admitting: Family Medicine

## 2022-02-28 ENCOUNTER — Encounter: Payer: Self-pay | Admitting: Family Medicine

## 2022-02-28 VITALS — BP 110/65 | HR 94 | Temp 98.0°F | Resp 16 | Ht 76.0 in | Wt 148.0 lb

## 2022-02-28 DIAGNOSIS — F418 Other specified anxiety disorders: Secondary | ICD-10-CM

## 2022-02-28 DIAGNOSIS — E785 Hyperlipidemia, unspecified: Secondary | ICD-10-CM

## 2022-02-28 DIAGNOSIS — G8929 Other chronic pain: Secondary | ICD-10-CM | POA: Diagnosis not present

## 2022-02-28 DIAGNOSIS — Z1211 Encounter for screening for malignant neoplasm of colon: Secondary | ICD-10-CM

## 2022-02-28 DIAGNOSIS — E11649 Type 2 diabetes mellitus with hypoglycemia without coma: Secondary | ICD-10-CM

## 2022-02-28 MED ORDER — PAROXETINE HCL 20 MG PO TABS: 20.0000 mg | ORAL_TABLET | Freq: Every day | ORAL | 1 refills | Status: DC

## 2022-02-28 MED ORDER — GEMFIBROZIL 600 MG PO TABS: 600.0000 mg | ORAL_TABLET | Freq: Two times a day (BID) | ORAL | 1 refills | Status: DC

## 2022-02-28 MED ORDER — METFORMIN HCL 1000 MG PO TABS: 1000.0000 mg | ORAL_TABLET | Freq: Two times a day (BID) | ORAL | 1 refills | Status: DC

## 2022-02-28 MED ORDER — TRAMADOL HCL 50 MG PO TABS: 50.0000 mg | ORAL_TABLET | Freq: Four times a day (QID) | ORAL | 0 refills | Status: DC | PRN

## 2022-02-28 MED ORDER — EMPAGLIFLOZIN 10 MG PO TABS: 10.0000 mg | ORAL_TABLET | Freq: Every day | ORAL | 1 refills | Status: DC

## 2022-02-28 NOTE — Progress Notes (Signed)
? ?Established Patient Office Visit ? ?Subjective:  ?Patient ID: Carlos Frieze., male    DOB: 07-Jul-1971  Age: 51 y.o. MRN: [id number] ? ?CC:  ?Chief Complaint  ?Patient presents with  ? Establish Care  ? ? ?HPI ?Carlos Pandy [facility]. presents for follow up of chronic med issues including diabetes.  ? ?Past Medical History:  ?Diagnosis Date  ? ADHD (attention deficit hyperactivity disorder)   ? Alcohol abuse   ? Depression   ? Diabetes mellitus without complication ([place])   ? History of exercise stress test   ? 03-18-2013--  normal  ? History of kidney stones   ? Hyperlipidemia   ? Kidney stones   ? Left ureteral stone   ? Type 2 diabetes mellitus ([place])   ? ? ?Past Surgical History:  ?Procedure Laterality Date  ? ANTERIOR CERVICAL DECOMP/DISCECTOMY FUSION N/A 08/30/2021  ? Procedure: CERVICAL FIVE-SIX ANTERIOR CERVICAL DECOMPRESSION/DISCECTOMY FUSION;  Surgeon: Vallarie Mare, MD;  Location: [place];  Service: Neurosurgery;  Laterality: N/A;  ? CARDIAC CATHETERIZATION  11-27-2006  dr Verlon Setting  ? non-obstructive CAD/  20% proximal and mid LAD/  perserved LVF,  ef 55-60%  ? CYSTOSCOPY W/ RETROGRADES Left 05/17/2015  ? Procedure: CYSTOSCOPY WITH RETROGRADE PYELOGRAM;  Surgeon: Festus Aloe, MD;  Location: [facility];  Service: Urology;  Laterality: Left;  ? CYSTOSCOPY/URETEROSCOPY/HOLMIUM LASER/STENT PLACEMENT Left 05/17/2015  ? Procedure: LEFT URETEROSCOPY/HOLMIUM LASER/STENT PLACEMENT;  Surgeon: Festus Aloe, MD;  [facility];  Service: Urology;  Laterality: Left;  ? EXTRACORPOREAL SHOCK WAVE LITHOTRIPSY Left 05-08-2015  ? EXTRACTION RIGHT MANDIBULAR , PREMOLAR/  MAXILLARY MANDIBULAR FIXATION WITH SCREWS  08-03-2008  ? LUMBAR PERCUTANEOUS PEDICLE SCREW 4 LEVEL N/A 09/04/2021  ? Procedure: Thoracic Four-Thoracic Eight  Percutaneous Instrumented Fusion;  Surgeon: Vallarie Mare, MD;  Location: [place];  Service: Neurosurgery;  Laterality: N/A;  ? ORIF FOUR HOLD PLATE AND  MAXILLOMANDIBULAR FIXATION W/ ARCH BARS  08-05-2008  ? REMOVAL ARCH BARS 09-15-2008  ? TRANSTHORACIC ECHOCARDIOGRAM  04-06-2008  dr Verlon Setting  ? normal LVF,  ef 55-60%,  trivial MR and TR  ? ? ?Family History  ?Problem Relation Age of Onset  ? Cancer Mother   ? Diabetes Mother   ?     type 1  ? Heart disease Mother   ?     pacemaker  ? Cancer - Colon Mother   ? Alcohol abuse Father   ? Alcohol abuse Maternal Uncle   ? Diabetes Maternal Grandmother   ? Alcohol abuse Maternal Grandfather   ? Diabetes Maternal Grandfather   ? Prostate cancer Neg Hx   ? ? ?Social History  ? ?Socioeconomic History  ? Marital status: Married  ?  Spouse name: Not on file  ? Number of children: Not on file  ? Years of education: Not on file  ? Highest education level: Not on file  ?Occupational History  ? Not on file  ?Tobacco Use  ? Smoking status: Never  ? Smokeless tobacco: Never  ?Vaping Use  ? Vaping Use: Never used  ?Substance and Sexual Activity  ? Alcohol use: Yes  ?  Alcohol/week: 10.0 standard drinks  ?  Types: 10 Cans of beer per week  ?  Comment: casual  ? Drug use: No  ? Sexual activity: Not on file  ?Other Topics Concern  ? Not on file  ?Social History Narrative  ? ** Merged History Encounter **  ?    ? ?Social Determinants  of Health  ? ?Financial Resource Strain: Not on file  ?Food Insecurity: Not on file  ?Transportation Needs: Not on file  ?Physical Activity: Not on file  ?Stress: Not on file  ?Social Connections: Not on file  ?Intimate Partner Violence: Not on file  ? ? ?ROS ?Review of Systems  ?All other systems reviewed and are negative. ? ?Objective:  ? ?Today's Vitals: BP 110/65   Pulse 94   Temp 98 ?F (36.7 ?C) (Oral)   Resp 16   Ht 6' 4"  (1.93 m)   Wt 148 lb (67.1 kg)   SpO2 96%   BMI 18.02 kg/m?  ? ?Physical Exam ?Vitals and nursing note reviewed.  ?Constitutional:   ?   General: He is not in acute distress. ?Cardiovascular:  ?   Rate and Rhythm: Normal rate and regular rhythm.  ?Pulmonary:  ?   Effort:  Pulmonary effort is normal.  ?   Breath sounds: Normal breath sounds.  ?Abdominal:  ?   Palpations: Abdomen is soft.  ?   Tenderness: There is no abdominal tenderness.  ?Musculoskeletal:     ?   General: Tenderness present. No deformity.  ?Neurological:  ?   General: No focal deficit present.  ?   Mental Status: He is alert and oriented to person, place, and time.  ?Psychiatric:     ?   Mood and Affect: Mood and affect normal.     ?   Speech: Speech normal.     ?   Behavior: Behavior normal.  ? ? ?Assessment & Plan:  ? ?1. Uncontrolled type 2 diabetes mellitus with hypoglycemia without coma ([place]) ?Rapidly increasing A1c. Patient defers further eval/mgt at this time. Will continue present management and monitor.  ? ?- metFORMIN (GLUCOPHAGE) 1000 MG tablet; Take 1 tablet (1,000 mg total) by mouth 2 (two) times daily with a meal.  Dispense: 180 tablet; Refill: 1 ?- empagliflozin (JARDIANCE) 10 MG TABS tablet; Take 1 tablet (10 mg total) by mouth daily before breakfast.  Dispense: 90 tablet; Refill: 1 ? ?2. Dyslipidemia ?Continue present management. Meds refilled.  ? ?3. Depression with anxiety ?Appears stable with present management. Meds refilled. monitor ? ?4. Other chronic pain ?Tramadol prescribed for breakthrough pain.  ? ?5. Screening for colon cancer ?Referral for cologuard.  ?- Cologuard ? ?Outpatient Encounter Medications as of 02/28/2022  ?Medication Sig  ? acetaminophen (TYLENOL) 325 MG tablet Take 1-2 tablets (325-650 mg total) by mouth every 4 (four) hours as needed for mild pain.  ? aspirin EC 81 MG EC tablet Take 1 tablet (81 mg total) by mouth daily. Swallow whole.  ? B Complex Vitamins (VITAMIN B COMPLEX) TABS Take1 tablet by mouth daily.  ? baclofen (LIORESAL) 10 MG tablet Take 0.5 tablets (5 mg total) by mouth 3 (three) times daily. X 1 week- if needs, can increase to 10 mg 3x/day- for spasticity- can increase in future if needed  ? empagliflozin (JARDIANCE) 10 MG TABS tablet Take 1 tablet (10 mg  total) by mouth daily before breakfast.  ? gabapentin (NEURONTIN) 100 MG capsule Take 2 capsules (200 mg total) by mouth 3 (three) times daily.  ? gemfibrozil (LOPID) 600 MG tablet Take 1 tablet (600 mg total) by mouth 2 (two) times daily before a meal.  ? meloxicam (MOBIC) 15 MG tablet Take 1 tablet (15 mg total) by mouth daily.  ? metFORMIN (GLUCOPHAGE) 1000 MG tablet Take 1 tablet (1,000 mg total) by mouth 2 (two) times daily with a meal.  ?  methocarbamol (ROBAXIN) 500 MG tablet Take 2 tablets (1,000 mg total) by mouth 3 (three) times daily.  ? mirtazapine (REMERON) 15 MG tablet TAKE 1 TABLET BY MOUTH EVERYDAY AT BEDTIME  ? Multiple Vitamin (MULTIVITAMIN WITH MINERALS) TABS tablet Take 1 tablet by mouth daily.  ? Oxycodone HCl 10 MG TABS Take 1 tablet (10 mg total) by mouth every 6 (six) hours as needed.  ? PARoxetine (PAXIL) 20 MG tablet Take 1 tablet (20 mg total) by mouth at bedtime.  ? tamsulosin (FLOMAX) 0.4 MG CAPS capsule Take 1 capsule (0.4 mg total) by mouth at bedtime.  ? traMADol (ULTRAM) 50 MG tablet Take 1 tablet (50 mg total) by mouth every 6 (six) hours as needed.  ? docusate sodium (COLACE) 100 MG capsule Take 100 mg by mouth 2 (two) times daily. (Patient not taking: Reported on 02/28/2022)  ? folic acid (FOLVITE) 1 MG tablet Take 1 tablet (1 mg total) by mouth daily. (Patient not taking: Reported on 02/28/2022)  ? midodrine (PROAMATINE) 10 MG tablet Take 1 tablet (10 mg total) by mouth 3 (three) times daily with meals. (Patient not taking: Reported on 02/28/2022)  ? ?No facility-administered encounter medications on file as of 02/28/2022.  ? ? ?Follow-up: No follow-ups on file.  ? ?Becky Sax, MD ? ?

## 2022-03-01 ENCOUNTER — Other Ambulatory Visit: Payer: Self-pay

## 2022-03-01 ENCOUNTER — Encounter: Payer: Self-pay | Admitting: Occupational Therapy

## 2022-03-01 ENCOUNTER — Ambulatory Visit: Payer: No Typology Code available for payment source | Admitting: Occupational Therapy

## 2022-03-01 DIAGNOSIS — M6281 Muscle weakness (generalized): Secondary | ICD-10-CM | POA: Diagnosis not present

## 2022-03-01 DIAGNOSIS — R208 Other disturbances of skin sensation: Secondary | ICD-10-CM

## 2022-03-01 DIAGNOSIS — M25612 Stiffness of left shoulder, not elsewhere classified: Secondary | ICD-10-CM

## 2022-03-01 DIAGNOSIS — R278 Other lack of coordination: Secondary | ICD-10-CM

## 2022-03-01 DIAGNOSIS — R2681 Unsteadiness on feet: Secondary | ICD-10-CM

## 2022-03-01 DIAGNOSIS — M25512 Pain in left shoulder: Secondary | ICD-10-CM

## 2022-03-01 NOTE — Therapy (Signed)
[facility] ?[address], [address] ?[place], [place], [address] ?Phone: [phone]   Fax:  [phone] ? ?Occupational Therapy Treatment ? ?Patient Details  ?Name: Carlos [name]. ?MRN: [id number] ?Date of Birth: 06/07/1971 ?Referring Provider (OT): Silvestre Mesi PA ? ? ?Encounter Date: 03/01/2022 ? ? OT End of Session - 03/01/22 1218   ? ? Visit Number 21   ? Number of Visits 30   ? Date for OT Re-Evaluation 03/14/22   ? Authorization Type [employer]   ? OT Start Time 1030   ? OT Stop Time 1100   ? OT Time Calculation (min) 30 min   ? ?  ?  ? ?  ? ? ?Past Medical History:  ?Diagnosis Date  ? ADHD (attention deficit hyperactivity disorder)   ? Alcohol abuse   ? Depression   ? Diabetes mellitus without complication ([place])   ? History of exercise stress test   ? 03-18-2013--  normal  ? History of kidney stones   ? Hyperlipidemia   ? Kidney stones   ? Left ureteral stone   ? Type 2 diabetes mellitus ([place])   ? ? ?Past Surgical History:  ?Procedure Laterality Date  ? ANTERIOR CERVICAL DECOMP/DISCECTOMY FUSION N/A 08/30/2021  ? Procedure: CERVICAL FIVE-SIX ANTERIOR CERVICAL DECOMPRESSION/DISCECTOMY FUSION;  Surgeon: Vallarie Mare, MD;  Location: [place];  Service: Neurosurgery;  Laterality: N/A;  ? CARDIAC CATHETERIZATION  11-27-2006  dr Verlon Setting  ? non-obstructive CAD/  20% proximal and mid LAD/  perserved LVF,  ef 55-60%  ? CYSTOSCOPY W/ RETROGRADES Left 05/17/2015  ? Procedure: CYSTOSCOPY WITH RETROGRADE PYELOGRAM;  Surgeon: Festus Aloe, MD;  Location: [facility];  Service: Urology;  Laterality: Left;  ? CYSTOSCOPY/URETEROSCOPY/HOLMIUM LASER/STENT PLACEMENT Left 05/17/2015  ? Procedure: LEFT URETEROSCOPY/HOLMIUM LASER/STENT PLACEMENT;  Surgeon: Festus Aloe, MD;  Location: [facility];  Service: Urology;  Laterality: Left;  ? EXTRACORPOREAL SHOCK WAVE LITHOTRIPSY Left 05-08-2015  ? EXTRACTION RIGHT MANDIBULAR , PREMOLAR/  MAXILLARY  MANDIBULAR FIXATION WITH SCREWS  08-03-2008  ? LUMBAR PERCUTANEOUS PEDICLE SCREW 4 LEVEL N/A 09/04/2021  ? Procedure: Thoracic Four-Thoracic Eight  Percutaneous Instrumented Fusion;  Surgeon: Vallarie Mare, MD;  Location: [place];  Service: Neurosurgery;  Laterality: N/A;  ? ORIF FOUR HOLD PLATE AND MAXILLOMANDIBULAR FIXATION W/ ARCH BARS  08-05-2008  ? REMOVAL ARCH BARS 09-15-2008  ? TRANSTHORACIC ECHOCARDIOGRAM  04-06-2008  dr Verlon Setting  ? normal LVF,  ef 55-60%,  trivial MR and TR  ? ? ?There were no vitals filed for this visit. ? ? Subjective Assessment - 03/01/22 1032   ? ? Subjective  Patient reports baclofen has brought him back to baseline - still occasional stiffness in UE   ? Pertinent History DM, alcohol use, quadriplegia (C6-T6 [place] D)   ? 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 Currently in Pain? Yes   ? Pain Score 3    ? Pain Location Shoulder   ? Pain Orientation Left   ? Pain Descriptors / Indicators Aching   ? Pain Type Chronic pain   ? Pain Radiating Towards Axillary border of scapula   ? Pain Onset More than a month ago   ? Pain Frequency Intermittent   ? Aggravating Factors  overuse   ? ?  ?  ? ?  ? ? ? ? ? ? ? ? ? ? ? ? ? ? ?  OT Treatments/Exercises (OP) - 03/01/22 0001   ? ?  ? Neurological Re-education Exercises  ? Other Exercises 1 Working on reaching patterns shoulder height to overhead - overtly emphasized mechanics of overhead reach - for distal humerus to go up, proximal humerus needs to drop down.  Patient continues with overactivity in anterior shoulder, and decreased stability to align scaula - posterior shoulder.  Discussed how reach patterns same whether movement is fast or slow - patient has been helping with stepson's baseball - so movements to catch a ball fast - need same emphasis.   ?  ? Manual Therapy  ? Manual Therapy Scapular mobilization   ? Manual therapy comments prone, sidelying   ?  Scapular Mobilization Needs increased emphasis on depression and adduction   ? ?  ?  ? ?  ? ? ? ? ? ? ? ? ? ? ? OT Short Term Goals - 03/01/22 1219   ? ?  ? OT SHORT TERM GOAL #1  ? Title Pt will demonstrate improved UE functional use for ADLs as evidenced by increasing box/ blocks score by 5 blocks with RUE.   ? Baseline 55 Right - from 51 on eval.  Need to reassess LUE   55, improvement of 4 blocks  ? Time 4   ? Period Weeks   ? Status Partially Met   ? Target Date 02/12/22   ?  ? OT SHORT TERM GOAL #2  ? Title Pt will demonstrate improved fine motor coordination for ADLs as evidenced by decreasing 9 hole peg test score for RUE by 3 secs   ? Baseline 39.6   28.28, 11 second improvement  ? Time 4   ? Period Weeks   ? Status Achieved   ?  ? OT SHORT TERM GOAL #3  ? Title Pt will demonstrate sufficient L shoulder range to reach to obtain object of 2# or less from shoulder height cabinet.   ? Time 4   ? Period Weeks   ? Status Achieved   progressing towards goal, however have limited shoulder ROM s/p diagnosis of adhesive capsilitis and injection 11/22  ?  ? OT SHORT TERM GOAL #4  ? Title Pt will demonstrate increased activity tolerance to complete standing activity for 10 mins without rest breaks.   ? Time 4   ? Period Weeks   ? Status Achieved   ? ?  ?  ? ?  ? ? ? ? OT Long Term Goals - 03/01/22 1220   ? ?  ? OT LONG TERM GOAL #1  ? Title Pt will report pain no > 3/10 when using arms for work/work simulated task.   ? Time 6   ? Period Weeks   ? Status On-going   ? Target Date 03/14/22   ?  ? OT LONG TERM GOAL #2  ? Title Pt will demonstrate sufficient L shoulder range to reach to obtain object of 2# or less from shoulder height cabinet.   ? Time 6   ? Period Weeks   ? Status Achieved   ?  ? OT LONG TERM GOAL #3  ? Title Pt will demonstrate and/or report sufficient strength and endurance to effectively use hand tools bilaterally.   ? Time 6   ? Period Weeks   ? Status On-going   Pt report ability to use hand tools  12/20 at home but reports increased pain and decreased endurance to complete task  ?  ? OT LONG TERM GOAL #  4  ? 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 Time 6   ? Period Weeks   ? Status Achieved   ?  ? OT LONG TERM GOAL #5  ? Title Pt will demonstrate improved UE functional use for ADLs as evidenced by increasing box/ blocks score by 5 blocks with RUE and LUE   R: 54 and L: 55  ? Baseline R: 51 and L: 42   ? Time 6   ? Period Weeks   ? Status Partially Met   progressing towards goal, however have limited shoulder ROM and tolerance to WB s/p multiple shoulder issues with adhesive capsilitis and MRI for rotator cuff tear (-)  ? ?  ?  ? ?  ? ? ? ? ? ? ? ? Plan - 03/01/22 1218   ? ? Clinical Impression Statement Patient continues to work to improve mecahnics of reach patterns in non dominant LUE.  Patient shows continued pain (mild) but also continued incrase in functional use.   ? OT Occupational Profile and History Detailed Assessment- Review of Records and additional review of physical, cognitive, psychosocial history related to current functional performance   ? Occupational performance deficits (Please refer to evaluation for details): ADL's;IADL's;Work;Play;Leisure;Social Participation   ? Body Structure / Function / Physical Skills ADL;Balance;Body mechanics;Decreased knowledge of precautions;Coordination;Endurance;Flexibility;FMC;Mobility;IADL;GMC;Pain;Proprioception;ROM;Strength;Sensation;UE functional use   ? Rehab Potential Good   ? Clinical Decision Making Limited treatment options, no task modification necessary   ? Comorbidities Affecting Occupational Performance: May have comorbidities impacting occupational performance   ? Modification or Assistance to Complete Evaluation  No modification of tasks or assist necessary to complete eval   ? OT Frequency 2x / week   ? OT Duration 8 weeks   ? OT Treatment/Interventions Self-care/ADL  training;Biofeedback;Cryotherapy;Electrical Stimulation;Moist Heat;Ultrasound;Fluidtherapy;Therapeutic exercise;Neuromuscular education;Energy conservation;DME and/or AE instruction;Functional Mobility Training;Manual Therapy;Passive r

## 2022-03-04 ENCOUNTER — Other Ambulatory Visit: Payer: Self-pay

## 2022-03-04 ENCOUNTER — Encounter: Payer: Self-pay | Admitting: Occupational Therapy

## 2022-03-04 ENCOUNTER — Ambulatory Visit: Payer: No Typology Code available for payment source | Admitting: Occupational Therapy

## 2022-03-04 DIAGNOSIS — M25512 Pain in left shoulder: Secondary | ICD-10-CM

## 2022-03-04 DIAGNOSIS — M6281 Muscle weakness (generalized): Secondary | ICD-10-CM | POA: Diagnosis not present

## 2022-03-04 DIAGNOSIS — R278 Other lack of coordination: Secondary | ICD-10-CM

## 2022-03-04 DIAGNOSIS — R208 Other disturbances of skin sensation: Secondary | ICD-10-CM

## 2022-03-04 DIAGNOSIS — R2681 Unsteadiness on feet: Secondary | ICD-10-CM

## 2022-03-04 DIAGNOSIS — M25612 Stiffness of left shoulder, not elsewhere classified: Secondary | ICD-10-CM

## 2022-03-04 NOTE — Therapy (Signed)
[place] ?[place], [place], [address] ?Phone: [phone]   Fax:  [phone] ? ?Occupational Therapy Treatment ? ?Patient Details  ?Name: Carlos [name]. ?MRN: [id number] ?Date of Birth: [birth year] ?Referring Provider (OT): Silvestre Mesi PA ? ? ?Encounter Date: 03/04/2022 ? ? OT End of Session - 03/04/22 1424   ? ? Visit Number 22   ? Number of Visits 30   ? Date for OT Re-Evaluation 03/14/22   ? Authorization Type [employer]   ? OT Start Time 1330   ? OT Stop Time 1415   ? OT Time Calculation (min) 45 min   ? Equipment Utilized During [employer], floatation equipment   ? Activity Tolerance Patient tolerated treatment well   ? Behavior During Therapy [facility] for tasks assessed/performed   ? ?  ?  ? ?  ? ? ?Past Medical History:  ?Diagnosis Date  ? ADHD (attention deficit hyperactivity disorder)   ? Alcohol abuse   ? Depression   ? Diabetes mellitus without complication ([place])   ? History of exercise stress test   ? 03-18-2013--  normal  ? History of kidney stones   ? Hyperlipidemia   ? Kidney stones   ? Left ureteral stone   ? Type 2 diabetes mellitus ([place])   ? ? ?Past Surgical History:  ?Procedure Laterality Date  ? ANTERIOR CERVICAL DECOMP/DISCECTOMY FUSION N/A 08/30/2021  ? Procedure: CERVICAL FIVE-SIX ANTERIOR CERVICAL DECOMPRESSION/DISCECTOMY FUSION;  Surgeon: Vallarie Mare, MD;  Location: [place];  Service: Neurosurgery;  Laterality: N/A;  ? CARDIAC CATHETERIZATION  11-27-2006  dr Verlon Setting  ? non-obstructive CAD/  20% proximal and mid LAD/  perserved LVF,  ef 55-60%  ? CYSTOSCOPY W/ RETROGRADES Left 05/17/2015  ? Procedure: CYSTOSCOPY WITH RETROGRADE PYELOGRAM;  Surgeon: Festus Aloe, MD;  Location: [facility];  Service: Urology;  Laterality: Left;  ? CYSTOSCOPY/URETEROSCOPY/HOLMIUM LASER/STENT PLACEMENT Left 05/17/2015  ? Procedure: LEFT URETEROSCOPY/HOLMIUM LASER/STENT PLACEMENT;  Surgeon:  Festus Aloe, MD;  Location: [facility];  Service: Urology;  Laterality: Left;  ? EXTRACORPOREAL SHOCK WAVE LITHOTRIPSY Left 05-08-2015  ? EXTRACTION RIGHT MANDIBULAR , PREMOLAR/  MAXILLARY MANDIBULAR FIXATION WITH SCREWS  08-03-2008  ? LUMBAR PERCUTANEOUS PEDICLE SCREW 4 LEVEL N/A 09/04/2021  ? Procedure: Thoracic Four-Thoracic Eight  Percutaneous Instrumented Fusion;  Surgeon: Vallarie Mare, MD;  Location: [place];  Service: Neurosurgery;  Laterality: N/A;  ? ORIF FOUR HOLD PLATE AND MAXILLOMANDIBULAR FIXATION W/ ARCH BARS  08-05-2008  ? REMOVAL ARCH BARS 09-15-2008  ? TRANSTHORACIC ECHOCARDIOGRAM  04-06-2008  dr Verlon Setting  ? normal LVF,  ef 55-60%,  trivial MR and TR  ? ? ?There were no vitals filed for this visit. ? ? Subjective Assessment - 03/04/22 1423   ? ? Subjective  Patient reports increase in soreness after fixing his father's engine.  "I did it though"   ? Patient is accompanied by: Family member   ? Pertinent History DM, alcohol use, quadriplegia (C6-T6 [place] D)   ? Limitations xxx   ? 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 Currently in Pain? Yes   ? Pain Score 3    ? Pain Location Shoulder   ? Pain Orientation Left   ? Pain Descriptors / Indicators Aching   ? Pain Type Chronic pain   ? Pain Radiating Towards axillary border of  scapula   ? Pain Onset More than a month ago   ? Pain Frequency Intermittent   ? Aggravating Factors  overuse   ? Pain Relieving Factors rest   ? ?  ?  ? ?  ? ?Pt seen for aquatic therapy today.  Treatment took place in water 3.25-4.8 ft in depth at the [employer] pool. Temp of water was 92?Marland Kitchen entered/exited the pool via stairs step to pattern independently with bilat rail ?Pt requires buoyancy for support and to offload joints with strengthening exercises. Viscosity of the water is needed for resistance of strengthening; water current perturbations provides  challenge to standing balance unsupported, requiring increased core activation.  ?Worked on [employer] - modified plank position on dumbbells in deep water to stretch shoulders in flexion.  Then worked with medium dumbbells for resisitance with shoulder flex/ext, abd/add, horizontal abd/add, bicep/tricep.  Worked on whole body stretch  - lateral, anterior and posterior trunk.  Worked in plank position on underwater step, and worked on side plank, with hip dip and trunk lateral flex/ext.  Also worked on thread the needle in plank position to address core stability and body on arm motion bilaterally.  Supine float to address rib cage, thoracic trunk, and shoulder mobility.   ? ? ? ? ? ? ? ? ? ? ? ? ? ? ? ? ? ? ? ? ? ? ? OT Short Term Goals - 03/04/22 1426   ? ?  ? OT SHORT TERM GOAL #1  ? Title Pt will demonstrate improved UE functional use for ADLs as evidenced by increasing box/ blocks score by 5 blocks with RUE.   ? Baseline 55 Right - from 51 on eval.  Need to reassess LUE   55, improvement of 4 blocks  ? Time 4   ? Period Weeks   ? Status Partially Met   ? Target Date 02/12/22   ?  ? OT SHORT TERM GOAL #2  ? Title Pt will demonstrate improved fine motor coordination for ADLs as evidenced by decreasing 9 hole peg test score for RUE by 3 secs   ? Baseline 39.6   28.28, 11 second improvement  ? Time 4   ? Period Weeks   ? Status Achieved   ?  ? OT SHORT TERM GOAL #3  ? Title Pt will demonstrate sufficient L shoulder range to reach to obtain object of 2# or less from shoulder height cabinet.   ? Time 4   ? Period Weeks   ? Status Achieved   progressing towards goal, however have limited shoulder ROM s/p diagnosis of adhesive capsilitis and injection 11/22  ?  ? OT SHORT TERM GOAL #4  ? Title Pt will demonstrate increased activity tolerance to complete standing activity for 10 mins without rest breaks.   ? Time 4   ? Period Weeks   ? Status Achieved   ? ?  ?  ? ?  ? ? ? ? OT Long Term Goals - 03/04/22 1427   ? ?  ?  OT LONG TERM GOAL #1  ? Title Pt will report pain no > 3/10 when using arms for work/work simulated task.   ? Time 6   ? Period Weeks   ? Status On-going   ? Target Date 03/14/22   ?  ? OT LONG TERM GOAL #2  ? Title Pt will demonstrate sufficient L shoulder range to reach to obtain object of 2# or less from shoulder height cabinet.   ?  Time 6   ? Period Weeks   ? Status Achieved   ?  ? OT LONG TERM GOAL #3  ? Title Pt will demonstrate and/or report sufficient strength and endurance to effectively use hand tools bilaterally.   ? Time 6   ? Period Weeks   ? Status On-going   Pt report ability to use hand tools 12/20 at home but reports increased pain and decreased endurance to complete task  ?  ? OT LONG TERM GOAL #4  ? 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 Time 6   ? Period Weeks   ? Status Achieved   ?  ? OT LONG TERM GOAL #5  ? Title Pt will demonstrate improved UE functional use for ADLs as evidenced by increasing box/ blocks score by 5 blocks with RUE and LUE   R: 54 and L: 55  ? Baseline R: 51 and L: 42   ? Time 6   ? Period Weeks   ? Status Partially Met   progressing towards goal, however have limited shoulder ROM and tolerance to WB s/p multiple shoulder issues with adhesive capsilitis and MRI for rotator cuff tear (-)  ? ?  ?  ? ?  ? ? ? ? ? ? ? ? Plan - 03/04/22 1425   ? ? Clinical Impression Statement Patient continues to show improved functional abilities, and continues to report pain (mild) in left shoulder   ? OT Occupational Profile and History Detailed Assessment- Review of Records and additional review of physical, cognitive, psychosocial history related to current functional performance   ? Occupational performance deficits (Please refer to evaluation for details): ADL's;IADL's;Work;Play;Leisure;Social Participation   ? Body Structure / Function / Physical Skills ADL;Balance;Body mechanics;Decreased knowledge of  precautions;Coordination;Endurance;Flexibility;FMC;Mobility;IADL;GMC;Pain;Proprioception;ROM;Strength;Sensation;UE functional use   ? Rehab Potential Good   ? Clinical Decision Making Limited treatment options, no task modification necessary   ? Com

## 2022-03-05 ENCOUNTER — Other Ambulatory Visit: Payer: Self-pay | Admitting: *Deleted

## 2022-03-05 NOTE — Telephone Encounter (Signed)
Spoke with pharm staff to discuss meds Lopid and Paxil as pt is requesting again. Pharm states they did receive these rx request and she was not sure why they were not processed but she will ship them out as a rush priority today. The only med out of the 5 requested she did not receive was Tramadol. I will resend for it. ?

## 2022-03-05 NOTE — Telephone Encounter (Signed)
Requested medication (s) are due for refill today: yes ? ?Requested medication (s) are on the active medication list:yes ? ?Future visit scheduled: 06/06/22 ? ?Notes to clinic:  Spoke with pharm who states they did not receive this request even though it says receipt received. She is asking that it be sent by through. It is not delegated, please assess. ? ? ?  ? ?Requested Prescriptions  ?Pending Prescriptions Disp Refills  ? traMADol (ULTRAM) 50 MG tablet 30 tablet 0  ?  Sig: Take 1 tablet (50 mg total) by mouth every 6 (six) hours as needed.  ?  ? Not Delegated - Analgesics:  Opioid Agonists Failed - 03/05/2022  1:48 PM  ?  ?  Failed - This refill cannot be delegated  ?  ?  Failed - Urine Drug Screen completed in last 360 days  ?  ?  Passed - Valid encounter within last 3 months  ?  Recent Outpatient Visits   ? ?      ? 5 days ago Uncontrolled type 2 diabetes mellitus with hypoglycemia without coma ([place])  ? Primary Care at [facility], MD  ? 1 month ago Encounter to establish care  ? Primary Care at [facility], Kriste Basque, NP  ? ?  ?  ?Future Appointments   ? ?        ? In 1 month Vanetta Mulders, MD [facility]  ? In 3 months Dorna Mai, MD Primary Care at [facility]  ? ?  ? ?  ?  ?  ? ? ?

## 2022-03-05 NOTE — Telephone Encounter (Signed)
Called pharm about other medications the patient had not received. She reviewed all requests and stated that even though it says received she did not receive Tramadol request.Will attach and request again. ?

## 2022-03-05 NOTE — Telephone Encounter (Signed)
Requested Prescriptions  ?Pending Prescriptions Disp Refills  ?? tamsulosin (FLOMAX) 0.4 MG CAPS capsule [Pharmacy Med Name: Tamsulosin HCl 0.4 MG Oral Capsule] 30 capsule 11  ?  Sig: TAKE 1 CAPSULE BY MOUTH AT  BEDTIME  ?  ? Urology: Alpha-Adrenergic Blocker Passed - 03/05/2022 11:18 AM  ?  ?  Passed - PSA in normal range and within 360 days  ?  PSA  ?Date Value Ref Range Status  ?07/12/2021 0.28 < OR = 4.00 ng/mL Final  ?  Comment:  ?  The total PSA value from this assay system is  ?standardized against the WHO standard. The test  ?result will be approximately 20% lower when compared  ?to the equimolar-standardized total PSA (Beckman  ?Coulter). Comparison of serial PSA results should be  ?interpreted with this fact in mind. ?. ?This test was performed using the Siemens  ?chemiluminescent method. Values obtained from  ?different assay methods cannot be used ?interchangeably. PSA levels, regardless of ?value, should not be interpreted as absolute ?evidence of the presence or absence of disease. ?  ?   ?  ?  Passed - Last BP in normal range  ?  BP Readings from Last 1 Encounters:  ?02/28/22 110/65  ?   ?  ?  Passed - Valid encounter within last 12 months  ?  Recent Outpatient Visits   ?      ? 5 days ago Uncontrolled type 2 diabetes mellitus with hypoglycemia without coma ([place])  ? Primary Care at [facility], MD  ? 1 month ago Encounter to establish care  ? Primary Care at [facility], Kriste Basque, NP  ?  ?  ?Future Appointments   ?        ? In 1 month Vanetta Mulders, MD [facility]  ? In 3 months Dorna Mai, MD Primary Care at [facility]  ?  ? ?  ?  ?  ?? gabapentin (NEURONTIN) 100 MG capsule [Pharmacy Med Name: Gabapentin 100 MG Oral Capsule] 180 capsule 11  ?  Sig: TAKE 2 CAPSULES BY MOUTH 3 TIMES DAILY  ?  ? Neurology: Anticonvulsants - gabapentin Passed - 03/05/2022 11:18 AM  ?  ?  Passed - Cr in normal range and within 360 days  ?  Creat  ?Date Value  Ref Range Status  ?07/12/2021 0.93 0.60 - 1.29 mg/dL Final  ? ?Creatinine, Ser  ?Date Value Ref Range Status  ?10/01/2021 0.74 0.61 - 1.24 mg/dL Final  ? ?Creatinine,U  ?Date Value Ref Range Status  ?09/15/2018 111.1 mg/dL Final  ?   ?  ?  Passed - Completed PHQ-2 or PHQ-9 in the last 360 days  ?  ?  Passed - Valid encounter within last 12 months  ?  Recent Outpatient Visits   ?      ? 5 days ago Uncontrolled type 2 diabetes mellitus with hypoglycemia without coma ([place])  ? Primary Care at [facility], MD  ? 1 month ago Encounter to establish care  ? Primary Care at [facility], Kriste Basque, NP  ?  ?  ?Future Appointments   ?        ? In 1 month Vanetta Mulders, MD [facility]  ? In 3 months Dorna Mai, MD Primary Care at [facility]  ?  ? ?  ?  ?  ? ? ?

## 2022-03-05 NOTE — Telephone Encounter (Signed)
Pt states that in his med request  these did not get sent, showing sent but asking for a resend as has confirmed as of today still did not receive ?gemfibrozil (LOPID) 600 MG tablet 180 tablet 1 02/28/2022    ?Sig - Route: Take 1 tablet (600 mg total) by mouth 2 (two) times daily before a meal. - Oral   ?Sent to pharmacy as: gemfibrozil (LOPID) 600 MG tablet   ?E-Prescribing Status: Receipt confirmed by pharmacy (02/28/2022  2:17    ? ?PARoxetine (PAXIL) 20 MG tablet 90 tablet 1 02/28/2022    ?Sig - Route: Take 1 tablet (20 mg total) by mouth at bedtime. - Oral   ?Sent to pharmacy as: PARoxetine (PAXIL) 20 MG tablet   ?E-Prescribing Status: Receipt confirmed by pharmacy (02/28/2022  2:17 PM EDT   ? ?[facility] ([facility] ) - [place], [place]  ?[place] [address]  ?Phone: [phone] Fax: [phone]  ? ?

## 2022-03-06 ENCOUNTER — Ambulatory Visit: Payer: No Typology Code available for payment source | Admitting: Occupational Therapy

## 2022-03-11 ENCOUNTER — Ambulatory Visit: Payer: No Typology Code available for payment source | Admitting: Occupational Therapy

## 2022-03-11 MED ORDER — OXYCODONE HCL 10 MG PO TABS: 10.0000 mg | ORAL_TABLET | Freq: Four times a day (QID) | ORAL | 0 refills | Status: DC | PRN

## 2022-03-12 ENCOUNTER — Other Ambulatory Visit: Payer: Self-pay | Admitting: Family Medicine

## 2022-03-12 NOTE — Telephone Encounter (Signed)
Copied from [place] [id number]. Topic: Quick Communication - Rx Refill/Question ?>> Mar 12, 2022  9:18 AM Tessa Lerner A wrote: ?Medication: Rx #: [id number]  ?midodrine (PROAMATINE) 10 MG tablet [[id number]]  ? ? ?Has the patient contacted their pharmacy? No. ?(Agent: If no, request that the patient contact the pharmacy for the refill. If patient does not wish to contact the pharmacy document the reason why and proceed with request.) ?(Agent: If yes, when and what did the pharmacy advise?) ? ?Preferred Pharmacy (with phone number or street name): [facility] ([facility] ) - [place], [place] ?[place] [address] ?Phone: [phone] Fax: [phone] ?Hours: Not open 24 hours ? ? ?Has the patient been seen for an appointment in the last year OR does the patient have an upcoming appointment? Yes.   ? ?Agent: Please be advised that RX refills may take up to 3 business days. We ask that you follow-up with your pharmacy. ?

## 2022-03-13 ENCOUNTER — Encounter: Payer: Self-pay | Admitting: Occupational Therapy

## 2022-03-13 NOTE — Telephone Encounter (Signed)
Requested medication (s) are due for refill today - yes ? ?Requested medication (s) are on the active medication list -yes ? ?Future visit scheduled -yes ? ?Last refill: 10/03/21 #90 ? ?Notes to clinic: Request RF: non delegated Rx, outside provider ? ?Requested Prescriptions  ?Pending Prescriptions Disp Refills  ? midodrine (PROAMATINE) 10 MG tablet 90 tablet 0  ?  Sig: Take 1 tablet (10 mg total) by mouth 3 (three) times daily with meals.  ?  ? Not Delegated - Cardiovascular: Midodrine Failed - 03/12/2022  3:26 PM  ?  ?  Failed - This refill cannot be delegated  ?  ?  Passed - Cr in normal range and within 360 days  ?  Creat  ?Date Value Ref Range Status  ?07/12/2021 0.93 0.60 - 1.29 mg/dL Final  ? ?Creatinine, Ser  ?Date Value Ref Range Status  ?10/01/2021 0.74 0.61 - 1.24 mg/dL Final  ? ?Creatinine,U  ?Date Value Ref Range Status  ?09/15/2018 111.1 mg/dL Final  ?  ?  ?  ?  Passed - ALT in normal range and within 360 days  ?  ALT  ?Date Value Ref Range Status  ?09/14/2021 14 0 - 44 U/L Final  ?  ?  ?  ?  Passed - AST in normal range and within 360 days  ?  AST  ?Date Value Ref Range Status  ?09/14/2021 16 15 - 41 U/L Final  ?  ?  ?  ?  Passed - Last BP in normal range  ?  BP Readings from Last 1 Encounters:  ?02/28/22 110/65  ?  ?  ?  ?  Passed - Valid encounter within last 12 months  ?  Recent Outpatient Visits   ? ?      ? 1 week ago Uncontrolled type 2 diabetes mellitus with hypoglycemia without coma ([place])  ? Primary Care at [facility], MD  ? 1 month ago Encounter to establish care  ? Primary Care at [facility], Kriste Basque, NP  ? ?  ?  ?Future Appointments   ? ?        ? In 1 month Vanetta Mulders, MD [facility]  ? In 2 months Dorna Mai, MD Primary Care at [facility]  ? ?  ? ?  ?  ?  ? ? ? ?Requested Prescriptions  ?Pending Prescriptions Disp Refills  ? midodrine (PROAMATINE) 10 MG tablet 90 tablet 0  ?  Sig: Take 1 tablet (10 mg total) by mouth  3 (three) times daily with meals.  ?  ? Not Delegated - Cardiovascular: Midodrine Failed - 03/12/2022  3:26 PM  ?  ?  Failed - This refill cannot be delegated  ?  ?  Passed - Cr in normal range and within 360 days  ?  Creat  ?Date Value Ref Range Status  ?07/12/2021 0.93 0.60 - 1.29 mg/dL Final  ? ?Creatinine, Ser  ?Date Value Ref Range Status  ?10/01/2021 0.74 0.61 - 1.24 mg/dL Final  ? ?Creatinine,U  ?Date Value Ref Range Status  ?09/15/2018 111.1 mg/dL Final  ?  ?  ?  ?  Passed - ALT in normal range and within 360 days  ?  ALT  ?Date Value Ref Range Status  ?09/14/2021 14 0 - 44 U/L Final  ?  ?  ?  ?  Passed - AST in normal range and within 360 days  ?  AST  ?Date Value Ref Range Status  ?  09/14/2021 16 15 - 41 U/L Final  ?  ?  ?  ?  Passed - Last BP in normal range  ?  BP Readings from Last 1 Encounters:  ?02/28/22 110/65  ?  ?  ?  ?  Passed - Valid encounter within last 12 months  ?  Recent Outpatient Visits   ? ?      ? 1 week ago Uncontrolled type 2 diabetes mellitus with hypoglycemia without coma ([place])  ? Primary Care at [facility], MD  ? 1 month ago Encounter to establish care  ? Primary Care at [facility], Kriste Basque, NP  ? ?  ?  ?Future Appointments   ? ?        ? In 1 month Vanetta Mulders, MD [facility]  ? In 2 months Dorna Mai, MD Primary Care at [facility]  ? ?  ? ?  ?  ?  ? ? ? ?

## 2022-03-15 ENCOUNTER — Encounter: Payer: Self-pay | Admitting: Physical Medicine and Rehabilitation

## 2022-03-15 ENCOUNTER — Ambulatory Visit: Payer: No Typology Code available for payment source | Admitting: Occupational Therapy

## 2022-03-15 ENCOUNTER — Encounter
Payer: No Typology Code available for payment source | Attending: Physical Medicine and Rehabilitation | Admitting: Physical Medicine and Rehabilitation

## 2022-03-15 VITALS — BP 132/87 | HR 93 | Ht 76.0 in | Wt 147.0 lb

## 2022-03-15 DIAGNOSIS — Z5181 Encounter for therapeutic drug level monitoring: Secondary | ICD-10-CM

## 2022-03-15 DIAGNOSIS — R252 Cramp and spasm: Secondary | ICD-10-CM

## 2022-03-15 DIAGNOSIS — G894 Chronic pain syndrome: Secondary | ICD-10-CM

## 2022-03-15 DIAGNOSIS — R64 Cachexia: Secondary | ICD-10-CM | POA: Diagnosis present

## 2022-03-15 DIAGNOSIS — Z79899 Other long term (current) drug therapy: Secondary | ICD-10-CM

## 2022-03-15 DIAGNOSIS — G8250 Quadriplegia, unspecified: Secondary | ICD-10-CM

## 2022-03-15 MED ORDER — BACLOFEN 10 MG PO TABS: 10.0000 mg | ORAL_TABLET | Freq: Four times a day (QID) | ORAL | 1 refills | Status: DC
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
## 2022-03-17 LAB — COLOGUARD: COLOGUARD: NEGATIVE

## 2022-03-18 ENCOUNTER — Ambulatory Visit: Payer: No Typology Code available for payment source | Attending: Occupational Therapy | Admitting: Occupational Therapy

## 2022-03-18 ENCOUNTER — Encounter: Payer: Self-pay | Admitting: Occupational Therapy

## 2022-03-18 DIAGNOSIS — M25612 Stiffness of left shoulder, not elsewhere classified: Secondary | ICD-10-CM | POA: Insufficient documentation

## 2022-03-18 DIAGNOSIS — M25512 Pain in left shoulder: Secondary | ICD-10-CM | POA: Insufficient documentation

## 2022-03-18 DIAGNOSIS — R208 Other disturbances of skin sensation: Secondary | ICD-10-CM | POA: Diagnosis present

## 2022-03-18 DIAGNOSIS — R2681 Unsteadiness on feet: Secondary | ICD-10-CM | POA: Insufficient documentation

## 2022-03-18 DIAGNOSIS — M6281 Muscle weakness (generalized): Secondary | ICD-10-CM | POA: Diagnosis present

## 2022-03-18 DIAGNOSIS — R278 Other lack of coordination: Secondary | ICD-10-CM | POA: Insufficient documentation

## 2022-03-18 NOTE — Therapy (Signed)
[facility] ?[place], [place], [address] ?Phone: [phone]   Fax:  [phone] ? ?Occupational Therapy Treatment and Recertification ? ?Patient Details  ?Name: Carlos [name]. ?MRN: [id number] ?Date of Birth: 28-Apr-1971 ?Referring Provider (OT): Silvestre Mesi PA ? ? ?Encounter Date: 03/18/2022 ? ? OT End of Session - 03/18/22 1614   ? ? Visit Number 23   ? Number of Visits 30   ? Date for OT Re-Evaluation 05/16/22   ? Authorization Type [employer]   ? OT Start Time 1400   ? OT Stop Time 1450   ? OT Time Calculation (min) 50 min   ? Equipment Utilized During Treatment resistance bands floatation equipment   ? Activity Tolerance Patient tolerated treatment well   ? Behavior During Therapy [facility] for tasks assessed/performed   ? ?  ?  ? ?  ? ? ?Past Medical History:  ?Diagnosis Date  ? ADHD (attention deficit hyperactivity disorder)   ? Alcohol abuse   ? Depression   ? Diabetes mellitus without complication ([place])   ? History of exercise stress test   ? 03-18-2013--  normal  ? History of kidney stones   ? Hyperlipidemia   ? Kidney stones   ? Left ureteral stone   ? Type 2 diabetes mellitus ([place])   ? ? ?Past Surgical History:  ?Procedure Laterality Date  ? ANTERIOR CERVICAL DECOMP/DISCECTOMY FUSION N/A 08/30/2021  ? Procedure: CERVICAL FIVE-SIX ANTERIOR CERVICAL DECOMPRESSION/DISCECTOMY FUSION;  Surgeon: Vallarie Mare, MD;  Location: [facility];  Service: Neurosurgery;  Laterality: N/A;  ? CARDIAC CATHETERIZATION  11-27-2006  dr Verlon Setting  ? non-obstructive CAD/  20% proximal and mid LAD/  perserved LVF,  ef 55-60%  ? CYSTOSCOPY W/ RETROGRADES Left 05/17/2015  ? Procedure: CYSTOSCOPY WITH RETROGRADE PYELOGRAM;  Surgeon: Festus Aloe, MD;  Location: [facility];  Service: Urology;  Laterality: Left;  ? CYSTOSCOPY/URETEROSCOPY/HOLMIUM LASER/STENT PLACEMENT Left 05/17/2015  ? Procedure: LEFT URETEROSCOPY/HOLMIUM LASER/STENT  PLACEMENT;  Surgeon: Festus Aloe, MD;  Location: [facility];  Service: Urology;  Laterality: Left;  ? EXTRACORPOREAL SHOCK WAVE LITHOTRIPSY Left 05-08-2015  ? EXTRACTION RIGHT MANDIBULAR , PREMOLAR/  MAXILLARY MANDIBULAR FIXATION WITH SCREWS  08-03-2008  ? LUMBAR PERCUTANEOUS PEDICLE SCREW 4 LEVEL N/A 09/04/2021  ? Procedure: Thoracic Four-Thoracic Eight  Percutaneous Instrumented Fusion;  Surgeon: Vallarie Mare, MD;  Location: [facility];  Service: Neurosurgery;  Laterality: N/A;  ? ORIF FOUR HOLD PLATE AND MAXILLOMANDIBULAR FIXATION W/ ARCH BARS  08-05-2008  ? REMOVAL ARCH BARS 09-15-2008  ? TRANSTHORACIC ECHOCARDIOGRAM  04-06-2008  dr Verlon Setting  ? normal LVF,  ef 55-60%,  trivial MR and TR  ? ? ?There were no vitals filed for this visit. ? ? Subjective Assessment - 03/18/22 1610   ? ? Subjective  Pattient reports considering a second opinion on his left shoulder.  Hoping for reduction in pain, and improvement in range of motion to ultimately be able to return to work.   ? Patient is accompanied by: Family member   ? Pertinent History DM, alcohol use, quadriplegia (C6-T6 [place] D)   ? 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 Currently in Pain? Yes   ? Pain Score 2    ? Pain Location Shoulder   ? Pain Orientation Left   ? Pain Descriptors / Indicators Aching   ? Pain  Type Chronic pain   ? Pain Radiating Towards lateral deltoid, axillary border of scapula   ? Pain Onset More than a month ago   ? Pain Frequency Intermittent   ? Aggravating Factors  overuse   ? Pain Relieving Factors rest   ? ?  ?  ? ?  ? ? ?Patient seen for aquatic therapy visit.  Patient entered and exited the pool via stairs and no assistance.  Treatment occurred in 3.5-4.4f of water.  Patient started with stretching shoulder at edge of pool. Elbow supported at 90* flexion elbow flexed, then working on mid to high reach mechanics for flexion and  abduction.  Working with elbow flexed to decrease lever arm to reduce "weight" of left arm.  Patient cued to activate anterior trunk muscles to align rib cage downward during reaching activities.  Followed with resistance training with bands for external rotation (humeral dependent position) IR/ADD, and ER/ABD combinations.  Working to improve posterior shoulder girdle strength and endurance, as well as coordination between shoulder and elbow.  ?Worked on swimming strokes as patient very comfortable in the water, and repetition of overhead strokes - freestyle, breaststroke would be advantageous to build up shoulder conditioning.   ?Ended with passive supine floatation and stretch with emphasis on latissimus, scapula/rib cage movement as well as humeral abduction.   ? ? ? ? ? ? ? ? ? ? ? ? ? ? ? ? ? ? ? ? ? ? OT Short Term Goals - 03/18/22 1620   ? ?  ? OT SHORT TERM GOAL #1  ? Title Pt will demonstrate improved UE functional use for ADLs as evidenced by increasing box/ blocks score by 5 blocks with RUE.   ? Baseline 55 Right - from 51 on eval.  Need to reassess LUE   55, improvement of 4 blocks  ? Time 4   ? Period Weeks   ? Status Partially Met   ? Target Date 02/12/22   ?  ? OT SHORT TERM GOAL #2  ? Title Pt will demonstrate improved fine motor coordination for ADLs as evidenced by decreasing 9 hole peg test score for RUE by 3 secs   ? Baseline 39.6   28.28, 11 second improvement  ? Time 4   ? Period Weeks   ? Status Achieved   ?  ? OT SHORT TERM GOAL #3  ? Title Pt will demonstrate sufficient L shoulder range to reach to obtain object of 2# or less from shoulder height cabinet.   ? Time 4   ? Period Weeks   ? Status Achieved   progressing towards goal, however have limited shoulder ROM s/p diagnosis of adhesive capsilitis and injection 11/22  ?  ? OT SHORT TERM GOAL #4  ? Title Pt will demonstrate increased activity tolerance to complete standing activity for 10 mins without rest breaks.   ? Time 4   ? Period  Weeks   ? Status Achieved   ? ?  ?  ? ?  ? ? ? ? OT Long Term Goals - 03/18/22 1621   ? ?  ? OT LONG TERM GOAL #1  ? Title Pt will report pain no > 3/10 when using arms for work/work simulated task.   ? Time 6   ? Period Weeks   ? Status On-going   ? Target Date 05/16/22   ?  ? OT LONG TERM GOAL #2  ? Title Pt will demonstrate sufficient L shoulder range to reach  to obtain object of 10# or less from shoulder height cabinet.   ? Time 6   ? Period Weeks   ? Status Revised   ? Target Date 05/16/22   ?  ? OT LONG TERM GOAL #3  ? Title Pt will demonstrate and/or report sufficient strength and endurance to effectively use hand tools bilaterally.   ? Time 6   ? Period Weeks   ? Status On-going   Pt report ability to use hand tools 12/20 at home but reports increased pain and decreased endurance to complete task  ?  ? OT LONG TERM GOAL #4  ? 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 Time 6   ? Period Weeks   ? Status Achieved   ?  ? OT LONG TERM GOAL #5  ? Title Pt will demonstrate improved UE functional use for ADLs as evidenced by increasing box/ blocks score by 5 blocks with RUE and LUE   R: 54 and L: 55  ? Baseline R: 51 and L: 42   ? Time 6   ? Period Weeks   ? Status Partially Met   progressing towards goal, however have limited shoulder ROM and tolerance to WB s/p multiple shoulder issues with adhesive capsilitis and MRI for rotator cuff tear (-)  ? ?  ?  ? ?  ? ? ? ? ? ? ? ? Plan - 03/18/22 1615   ? ? Clinical Impression Statement This progress update will serve as a recertification for additional time to complete planned therapy visits.  Patient has had transportation and illness (himself and family members) which have impacted his attendance in therapy. Patient has shown steady improvement in muscle strength, and functional use of LUE, however he is still limited by decreased endurance, limited range of motion and strength for mid to high reach, and consistent pain.   Patient will benefit from continuing with his remainign 8 OT visits.   ? OT Occupational Profile and History Detailed Assessment- Review of Records and additional review of physical, cognitive, psychosocial histo

## 2022-03-25 ENCOUNTER — Ambulatory Visit: Payer: No Typology Code available for payment source | Admitting: Occupational Therapy

## 2022-03-25 ENCOUNTER — Ambulatory Visit: Payer: Self-pay | Admitting: Occupational Therapy

## 2022-03-28 LAB — DRUG TOX MONITOR 1 W/CONF, ORAL FLD
Amphetamines: NEGATIVE ng/mL (ref <10)
Barbiturates: NEGATIVE ng/mL (ref <10)
Benzodiazepines: NEGATIVE ng/mL (ref <0.50)
Buprenorphine: NEGATIVE ng/mL (ref <0.10)
Cocaine: NEGATIVE ng/mL (ref <5.0)
Codeine: NEGATIVE ng/mL (ref <2.5)
Dihydrocodeine: NEGATIVE ng/mL (ref <2.5)
Fentanyl: NEGATIVE ng/mL (ref <0.10)
Heroin Metabolite: NEGATIVE ng/mL (ref <1.0)
Hydrocodone: NEGATIVE ng/mL (ref <2.5)
Hydromorphone: NEGATIVE ng/mL (ref <2.5)
MARIJUANA: NEGATIVE ng/mL (ref <2.5)
MDMA: NEGATIVE ng/mL (ref <10)
Meprobamate: NEGATIVE ng/mL (ref <2.5)
Methadone: NEGATIVE ng/mL (ref <5.0)
Morphine: NEGATIVE ng/mL (ref <2.5)
Nicotine Metabolite: NEGATIVE ng/mL (ref <5.0)
Norhydrocodone: NEGATIVE ng/mL (ref <2.5)
Noroxycodone: 54.6 ng/mL — ABNORMAL HIGH (ref <2.5)
Opiates: POSITIVE ng/mL — AB (ref <2.5)
Oxycodone: 81.4 ng/mL — ABNORMAL HIGH (ref <2.5)
Oxymorphone: NEGATIVE ng/mL (ref <2.5)
Phencyclidine: NEGATIVE ng/mL (ref <10)
Tapentadol: NEGATIVE ng/mL (ref <5.0)
Tramadol: NEGATIVE ng/mL (ref <5.0)
Zolpidem: NEGATIVE ng/mL (ref <5.0)

## 2022-03-28 LAB — DRUG TOX ALC METAB W/CON, ORAL FLD: Alcohol Metabolite: NEGATIVE ng/mL (ref <25)

## 2022-04-01 ENCOUNTER — Ambulatory Visit: Payer: No Typology Code available for payment source | Admitting: Occupational Therapy

## 2022-04-04 ENCOUNTER — Telehealth: Payer: Self-pay | Admitting: *Deleted

## 2022-04-04 NOTE — Telephone Encounter (Signed)
Oral swab drug screen was consistent for prescribed medications.  ?

## 2022-04-05 ENCOUNTER — Encounter: Payer: Self-pay | Admitting: Physical Medicine and Rehabilitation

## 2022-04-08 ENCOUNTER — Ambulatory Visit: Payer: No Typology Code available for payment source | Admitting: Occupational Therapy

## 2022-04-09 ENCOUNTER — Telehealth: Payer: Self-pay | Admitting: *Deleted

## 2022-04-09 NOTE — Telephone Encounter (Signed)
Carlos [name] called about his mychart message requesting his medication be approved to fill early. It is due 03/15/22 and he is leaving to go out of town 04/11/22 through 04/21/22 and needs to be able to pick it up on Thursday before he leaves.  Permission will have to be given to the pharmacist. ?

## 2022-04-10 MED ORDER — OXYCODONE HCL 10 MG PO TABS: 10.0000 mg | ORAL_TABLET | Freq: Four times a day (QID) | ORAL | 0 refills | Status: DC | PRN

## 2022-04-10 NOTE — Telephone Encounter (Signed)
Pharmacy states that Oxycodone is 4 days early and insurance may not pay for it. Patient is aware. ?

## 2022-04-15 ENCOUNTER — Ambulatory Visit: Payer: No Typology Code available for payment source | Attending: Occupational Therapy | Admitting: Occupational Therapy

## 2022-04-15 ENCOUNTER — Ambulatory Visit (INDEPENDENT_AMBULATORY_CARE_PROVIDER_SITE_OTHER): Payer: No Typology Code available for payment source | Admitting: Orthopaedic Surgery

## 2022-04-15 ENCOUNTER — Ambulatory Visit (HOSPITAL_BASED_OUTPATIENT_CLINIC_OR_DEPARTMENT_OTHER)
Admission: RE | Admit: 2022-04-15 | Discharge: 2022-04-15 | Disposition: A | Discharge status: Home / Self Care | Payer: No Typology Code available for payment source | Source: Ambulatory Visit | Attending: Orthopaedic Surgery | Admitting: Orthopaedic Surgery

## 2022-04-15 ENCOUNTER — Other Ambulatory Visit (HOSPITAL_BASED_OUTPATIENT_CLINIC_OR_DEPARTMENT_OTHER): Payer: Self-pay | Admitting: Orthopaedic Surgery

## 2022-04-15 DIAGNOSIS — G8929 Other chronic pain: Secondary | ICD-10-CM | POA: Insufficient documentation

## 2022-04-15 DIAGNOSIS — M25512 Pain in left shoulder: Secondary | ICD-10-CM

## 2022-04-15 DIAGNOSIS — G2589 Other specified extrapyramidal and movement disorders: Secondary | ICD-10-CM
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
## 2022-04-17 ENCOUNTER — Encounter (HOSPITAL_BASED_OUTPATIENT_CLINIC_OR_DEPARTMENT_OTHER): Payer: Self-pay | Admitting: Orthopaedic Surgery

## 2022-04-17 NOTE — Progress Notes (Signed)
Filled out handicapped placard for pt- call pt to see if he wants to pick up or get maile-d thanks- ML ?

## 2022-04-17 NOTE — Telephone Encounter (Signed)
Patient aware the form is at the front desk for pick up. He will have to fill out his sections. Then it should be copied for scanning. Patient will be given the original.  ?

## 2022-04-18 MED ORDER — MIRTAZAPINE 15 MG PO TABS: ORAL_TABLET | ORAL | 1 refills | Status: DC

## 2022-04-18 NOTE — Addendum Note (Signed)
Addended by: Caro Hight on: 04/18/2022 10:36 AM ? ? Modules accepted: Orders ? ?

## 2022-04-20 ENCOUNTER — Ambulatory Visit (HOSPITAL_BASED_OUTPATIENT_CLINIC_OR_DEPARTMENT_OTHER): Payer: No Typology Code available for payment source

## 2022-04-22 ENCOUNTER — Telehealth: Payer: Self-pay
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
## 2022-04-23 MED ORDER — OXYCODONE HCL 10 MG PO TABS: 10.0000 mg | ORAL_TABLET | Freq: Four times a day (QID) | ORAL | 0 refills | Status: DC | PRN

## 2022-04-23 NOTE — Telephone Encounter (Signed)
Patient notified

## 2022-04-25 ENCOUNTER — Ambulatory Visit (HOSPITAL_BASED_OUTPATIENT_CLINIC_OR_DEPARTMENT_OTHER)
Admission: RE | Admit: 2022-04-25 | Discharge: 2022-04-25 | Disposition: A | Discharge status: Home / Self Care | Payer: No Typology Code available for payment source | Source: Ambulatory Visit | Attending: Orthopaedic Surgery | Admitting: Orthopaedic Surgery

## 2022-04-25 DIAGNOSIS — M25512 Pain in left shoulder: Secondary | ICD-10-CM | POA: Insufficient documentation

## 2022-04-25 DIAGNOSIS — G8929 Other chronic pain: Secondary | ICD-10-CM | POA: Insufficient documentation

## 2022-04-30 ENCOUNTER — Encounter (HOSPITAL_BASED_OUTPATIENT_CLINIC_OR_DEPARTMENT_OTHER): Payer: Self-pay | Admitting: Orthopaedic Surgery

## 2022-05-09 ENCOUNTER — Telehealth: Payer: Self-pay

## 2022-05-09 NOTE — Telephone Encounter (Signed)
Patient called and stated he leaves on 05/12/22 to go to [place] for 2 weeks. He wants to know if he can get his pain medication filled before he leave. Per PMP, last fill was 04/23/22

## 2022-05-10 ENCOUNTER — Telehealth: Payer: Self-pay | Admitting: *Deleted

## 2022-05-10 ENCOUNTER — Ambulatory Visit (INDEPENDENT_AMBULATORY_CARE_PROVIDER_SITE_OTHER): Payer: No Typology Code available for payment source | Admitting: Orthopaedic Surgery

## 2022-05-10 ENCOUNTER — Telehealth: Payer: Self-pay

## 2022-05-10 DIAGNOSIS — G2589 Other specified extrapyramidal and movement disorders: Secondary | ICD-10-CM | POA: Diagnosis not present

## 2022-05-10 DIAGNOSIS — R0781 Pleurodynia: Secondary | ICD-10-CM | POA: Diagnosis not present

## 2022-05-10 MED ORDER — OXYCODONE HCL 20 MG PO TABS
10.0000 mg | ORAL_TABLET | Freq: Four times a day (QID) | ORAL | 0 refills | Status: DC | PRN
Start: 2022-05-10 — End: 2022-05-10

## 2022-05-10 MED ORDER — OXYCODONE HCL 20 MG PO TABS: 10.0000 mg | ORAL_TABLET | Freq: Four times a day (QID) | ORAL | 0 refills | Status: DC | PRN

## 2022-05-10 NOTE — Telephone Encounter (Signed)
He will need to check with his insurance and pharmacy- if they agree, will refill, but usually they will not allow a refill basically at 19 days from the last refill- please let me know- thanks - ML

## 2022-05-10 NOTE — Telephone Encounter (Signed)
Spoke with patient and he is willing to pay out of pocket for his pain medication at [facility]

## 2022-05-10 NOTE — Telephone Encounter (Signed)
Patient came in clinic and stated [facility] did not have Oxycodone 10 mg but did have 20 mg. Called [facility] to confirm and they stated they have plenty of Oxycodone 20 mg. They stated those can be sent in however the provider sees fit

## 2022-05-10 NOTE — Telephone Encounter (Signed)
Per Dr. Dagoberto Ligas, she will not do this again for a year and patient notified

## 2022-05-10 NOTE — Telephone Encounter (Signed)
[facility] does not have the Oxycodone 20 mg.  He has called and says that [facility] [address] [facility] them.

## 2022-05-10 NOTE — Addendum Note (Signed)
Addended by: Courtney Heys on: 05/10/2022 01:20 PM   Modules accepted: Orders
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
## 2022-05-17 ENCOUNTER — Other Ambulatory Visit: Payer: Self-pay | Admitting: *Deleted

## 2022-05-17 MED ORDER — BACLOFEN 10 MG PO TABS
10.0000 mg | ORAL_TABLET | Freq: Four times a day (QID) | ORAL | 1 refills | Status: DC
Start: 2022-05-17 — End: 2022-05-21

## 2022-05-21 ENCOUNTER — Telehealth: Payer: Self-pay

## 2022-05-21 MED ORDER — BACLOFEN 10 MG PO TABS: 10.0000 mg | ORAL_TABLET | Freq: Four times a day (QID) | ORAL | 1 refills | Status: DC

## 2022-05-21 NOTE — Telephone Encounter (Signed)
Call back phone [phone]   Carlos [name] will be out of his Baclofen 10 MG tonight. The mail order will send out refills when it's due on 06/10/2022. Per [name] he has ran out Baclofen because of the dosing change.   Will you please send in a new Rx or a bridge to the [facility] on [address]?

## 2022-05-21 NOTE — Addendum Note (Signed)
Addended by: Courtney Heys on: 05/21/2022 07:13 PM   Modules accepted: Orders

## 2022-06-02 ENCOUNTER — Other Ambulatory Visit: Payer: Self-pay | Admitting: Endocrinology

## 2022-06-02 DIAGNOSIS — R7989 Other specified abnormal findings of blood chemistry: Secondary | ICD-10-CM

## 2022-06-02 DIAGNOSIS — E11649 Type 2 diabetes mellitus with hypoglycemia without coma: Secondary | ICD-10-CM

## 2022-06-03 ENCOUNTER — Other Ambulatory Visit (INDEPENDENT_AMBULATORY_CARE_PROVIDER_SITE_OTHER): Payer: No Typology Code available for payment source

## 2022-06-03 DIAGNOSIS — E11649 Type 2 diabetes mellitus with hypoglycemia without coma: Secondary | ICD-10-CM | POA: Diagnosis not present

## 2022-06-03 DIAGNOSIS — R7989 Other specified abnormal findings of blood chemistry: Secondary | ICD-10-CM | POA: Diagnosis not present

## 2022-06-03 LAB — TESTOSTERONE: Testosterone: 340.88 ng/dL (ref 300.00–890.00)

## 2022-06-03 LAB — BASIC METABOLIC PANEL
BUN: 17 mg/dL (ref 6–23)
CO2: 29 mEq/L (ref 19–32)
Calcium: 9.8 mg/dL (ref 8.4–10.5)
Chloride: 98 mEq/L (ref 96–112)
Creatinine, Ser: 0.79 mg/dL (ref 0.40–1.50)
GFR: 104.02 mL/min (ref >60.00)
Glucose, Bld: 169 mg/dL — ABNORMAL HIGH (ref 70–99)
Potassium: 4.1 mEq/L (ref 3.5–5.1)
Sodium: 136 mEq/L (ref 135–145)

## 2022-06-03 LAB — MICROALBUMIN / CREATININE URINE RATIO
Creatinine,U: 64.2 mg/dL
Microalb Creat Ratio: 1.1 mg/g (ref 0.0–30.0)
Microalb, Ur: <0.7 mg/dL (ref 0.0–1.9)

## 2022-06-03 LAB — HEMOGLOBIN A1C: Hgb A1c MFr Bld: 7.5 % — ABNORMAL HIGH (ref 4.6–6.5)

## 2022-06-06 ENCOUNTER — Ambulatory Visit: Payer: No Typology Code available for payment source | Admitting: Family Medicine

## 2022-06-06 ENCOUNTER — Ambulatory Visit (INDEPENDENT_AMBULATORY_CARE_PROVIDER_SITE_OTHER): Payer: No Typology Code available for payment source | Admitting: Endocrinology

## 2022-06-06 ENCOUNTER — Encounter: Payer: Self-pay | Admitting: Endocrinology

## 2022-06-06 ENCOUNTER — Telehealth: Payer: Self-pay

## 2022-06-06 VITALS — BP 122/78 | HR 91 | Ht 76.0 in | Wt 144.6 lb

## 2022-06-06 DIAGNOSIS — E782 Mixed hyperlipidemia: Secondary | ICD-10-CM

## 2022-06-06 DIAGNOSIS — E11649 Type 2 diabetes mellitus with hypoglycemia without coma: Secondary | ICD-10-CM | POA: Diagnosis not present

## 2022-06-06 MED ORDER — RYBELSUS 7 MG PO TABS: 1.0000 | ORAL_TABLET | Freq: Every day | ORAL | 3 refills | Status: DC

## 2022-06-06 NOTE — Patient Instructions (Signed)
Check blood sugars on waking up days a week  Also check blood sugars about 2 hours after meals and do this after different meals by rotation  Recommended blood sugar levels on waking up are 90-130 and about 2 hours after meal is 130-160  Please bring your blood sugar monitor to each visit, thank you  Rybelsus improves blood sugar control as well as can help with weight loss and reduces cardiovascular events. Need to take the capsules on empty stomach 30 minutes before breakfast with 4 ounces of water daily.  You may feel more fullness at mealtimes and try to keep portions at meals smaller Some people may have nausea or even vomiting that may occur in the first few days; usually the symptoms go away with time. Please call if nausea or vomiting does not improve within 2 weeks

## 2022-06-06 NOTE — Telephone Encounter (Signed)
Filled  Written  ID  Drug  QTY  Days  Prescriber  RX #  Dispenser  Refill  Daily Dose*  Pymt Type  PMP  05/10/2022 05/10/2022 2  Oxycodone Hcl (Ir) 20 Mg Tab 60.00 30 Me Lov [id number] Nor ([id number]) 0/0 60.00 MME Comm Ins [place]  Patient stated he will be going out of town this weekend and need his Rx to be available at the pharmacy.
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
## 2022-06-07 ENCOUNTER — Telehealth: Payer: Self-pay | Admitting: Physical Medicine and Rehabilitation

## 2022-06-07 ENCOUNTER — Encounter (HOSPITAL_BASED_OUTPATIENT_CLINIC_OR_DEPARTMENT_OTHER): Payer: Self-pay | Admitting: Orthopaedic Surgery
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
## 2022-06-07 NOTE — Telephone Encounter (Signed)
Patient called.

## 2022-06-07 NOTE — Telephone Encounter (Signed)
Formal warning letter mailed and sent through [facility].

## 2022-06-10 ENCOUNTER — Other Ambulatory Visit (HOSPITAL_BASED_OUTPATIENT_CLINIC_OR_DEPARTMENT_OTHER): Payer: Self-pay | Admitting: Orthopaedic Surgery

## 2022-06-10 DIAGNOSIS — G8921 Chronic pain due to trauma: Secondary | ICD-10-CM
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
## 2022-06-20 ENCOUNTER — Telehealth: Payer: Self-pay

## 2022-06-20 MED ORDER — OXYCODONE HCL 10 MG PO TABS: 10.0000 mg | ORAL_TABLET | Freq: Four times a day (QID) | ORAL | 0 refills | Status: DC | PRN

## 2022-06-20 MED ORDER — OXYCODONE HCL 20 MG PO TABS: 10.0000 mg | ORAL_TABLET | Freq: Four times a day (QID) | ORAL | 0 refills | Status: DC | PRN

## 2022-06-20 NOTE — Telephone Encounter (Signed)
PMP:  PATIENT REQUEST FOR OXYCODONE 10 MG. Per Patient he is out of town. [facility] [place].   Filled  Written  ID  Drug  QTY  Days  Prescriber  RX #  Dispenser  Refill  Daily Dose*  Pymt Type  PMP  05/10/2022 05/10/2022 2  Oxycodone Hcl (Ir) 20 Mg Tab 60.00 30 Me Lov [id number] [facility] ([id number]) 0/0 60.00 MME Comm Ins [place] 04/23/2022 04/23/2022 4  Oxycodone Hcl (Ir) 10 Mg Tab 120.00 30 Me Lov [id number] [facility] ([id number]) 0/0 60.00 MME Comm Ins 

## 2022-06-20 NOTE — Telephone Encounter (Signed)
PMP was Reviewed.  Oxycodone 10 mg e-scribed today.  Placed a call to Mr. Akard regarding the above. He verbalizes understanding.

## 2022-06-20 NOTE — Telephone Encounter (Signed)
Mr Uhlig called back and [facility] not [facility].  They have the 10 mg tablets in stock at this moment.

## 2022-06-26 ENCOUNTER — Ambulatory Visit: Payer: No Typology Code available for payment source | Admitting: Family

## 2022-06-26 ENCOUNTER — Encounter: Payer: No Typology Code available for payment source | Admitting: Physical Medicine and Rehabilitation

## 2022-07-08 ENCOUNTER — Other Ambulatory Visit: Payer: Self-pay

## 2022-07-08 DIAGNOSIS — E11649 Type 2 diabetes mellitus with hypoglycemia without coma: Secondary | ICD-10-CM

## 2022-07-08 MED ORDER — RYBELSUS 7 MG PO TABS: 1.0000 | ORAL_TABLET | Freq: Every day | ORAL | 3 refills | Status: DC

## 2022-07-17 ENCOUNTER — Telehealth: Payer: Self-pay

## 2022-07-17 MED ORDER — OXYCODONE HCL 10 MG PO TABS: 10.0000 mg | ORAL_TABLET | Freq: Four times a day (QID) | ORAL | 0 refills | Status: DC | PRN

## 2022-07-17 NOTE — Telephone Encounter (Signed)
Per patient he will be out of pain medication on Friday.   My PMP is not working.

## 2022-07-18 ENCOUNTER — Other Ambulatory Visit: Payer: Self-pay | Admitting: Family Medicine

## 2022-07-18 DIAGNOSIS — E11649 Type 2 diabetes mellitus with hypoglycemia without coma: Secondary | ICD-10-CM

## 2022-07-19 NOTE — Telephone Encounter (Signed)
Courtesy refill. Schedule appointment.

## 2022-07-26 ENCOUNTER — Ambulatory Visit: Payer: No Typology Code available for payment source | Admitting: Family Medicine

## 2022-08-05 ENCOUNTER — Telehealth: Payer: Self-pay

## 2022-08-05 MED ORDER — BACLOFEN 20 MG PO TABS: 20.0000 mg | ORAL_TABLET | Freq: Three times a day (TID) | ORAL | 5 refills | Status: DC

## 2022-08-05 NOTE — Telephone Encounter (Signed)
Patient calling in regarding spasticity getting worse at night and unable to sleep wants increase in medicine

## 2022-08-05 NOTE — Telephone Encounter (Signed)
Carlos [name] cancelled his last appointment- I haven't seen him since March as of now.   I am happy to increase his spasticity meds his Baclofen to 20 mg 3x/day, however I do NOT feel comfortable increasing his pain meds- my goal over time was to wean his pain meds, not continue them long term.

## 2022-08-14 ENCOUNTER — Encounter: Payer: Self-pay | Admitting: Physical Medicine and Rehabilitation

## 2022-08-14 ENCOUNTER — Other Ambulatory Visit: Payer: Self-pay

## 2022-08-14 ENCOUNTER — Encounter
Payer: No Typology Code available for payment source | Attending: Physical Medicine and Rehabilitation | Admitting: Physical Medicine and Rehabilitation

## 2022-08-14 VITALS — BP 127/85 | HR 76 | Ht 76.0 in | Wt 144.0 lb

## 2022-08-14 DIAGNOSIS — Z79899 Other long term (current) drug therapy: Secondary | ICD-10-CM | POA: Insufficient documentation

## 2022-08-14 DIAGNOSIS — Z5181 Encounter for therapeutic drug level monitoring: Secondary | ICD-10-CM | POA: Insufficient documentation

## 2022-08-14 DIAGNOSIS — G894 Chronic pain syndrome: Secondary | ICD-10-CM

## 2022-08-14 DIAGNOSIS — G8250 Quadriplegia, unspecified: Secondary | ICD-10-CM | POA: Insufficient documentation

## 2022-08-14 DIAGNOSIS — R252 Cramp and spasm: Secondary | ICD-10-CM | POA: Diagnosis present

## 2022-08-14 MED ORDER — TAMSULOSIN HCL 0.4 MG PO CAPS: 0.4000 mg | ORAL_CAPSULE | Freq: Every day | ORAL | 1 refills | Status: DC

## 2022-08-14 MED ORDER — TRAZODONE HCL 50 MG PO TABS
50.0000 mg | ORAL_TABLET | Freq: Every day | ORAL | 5 refills | Status: DC
Start: 2022-08-14 — End: 2022-09-05

## 2022-08-14 MED ORDER — OXYCODONE HCL 10 MG PO TABS: 10.0000 mg | ORAL_TABLET | Freq: Four times a day (QID) | ORAL | 0 refills | Status: DC | PRN

## 2022-08-14 MED ORDER — BACLOFEN 20 MG PO TABS: 40.0000 mg | ORAL_TABLET | Freq: Three times a day (TID) | ORAL | 1 refills | Status: DC

## 2022-08-14 NOTE — Addendum Note (Signed)
Addended by: Franchot Gallo on: 08/14/2022 02:28 PM   Modules accepted: Orders
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
## 2022-08-17 ENCOUNTER — Other Ambulatory Visit: Payer: Self-pay | Admitting: Physical Medicine and Rehabilitation

## 2022-08-17 ENCOUNTER — Encounter: Payer: Self-pay | Admitting: Physical Medicine and Rehabilitation

## 2022-08-17 LAB — DRUG TOX MONITOR 1 W/CONF, ORAL FLD
Amphetamines: NEGATIVE ng/mL (ref <10)
Barbiturates: NEGATIVE ng/mL (ref <10)
Benzodiazepines: NEGATIVE ng/mL (ref <0.50)
Buprenorphine: NEGATIVE ng/mL (ref <0.10)
Cocaine: NEGATIVE ng/mL (ref <5.0)
Codeine: NEGATIVE ng/mL (ref <2.5)
Dihydrocodeine: NEGATIVE ng/mL (ref <2.5)
Fentanyl: NEGATIVE ng/mL (ref <0.10)
Heroin Metabolite: NEGATIVE ng/mL (ref <1.0)
Hydrocodone: NEGATIVE ng/mL (ref <2.5)
Hydromorphone: NEGATIVE ng/mL (ref <2.5)
MARIJUANA: NEGATIVE ng/mL (ref <2.5)
MDMA: NEGATIVE ng/mL (ref <10)
Meprobamate: NEGATIVE ng/mL (ref <2.5)
Methadone: NEGATIVE ng/mL (ref <5.0)
Morphine: NEGATIVE ng/mL (ref <2.5)
Nicotine Metabolite: NEGATIVE ng/mL (ref <5.0)
Norhydrocodone: NEGATIVE ng/mL (ref <2.5)
Noroxycodone: 50.4 ng/mL — ABNORMAL HIGH (ref <2.5)
Opiates: POSITIVE ng/mL — AB (ref <2.5)
Oxycodone: >250 ng/mL — ABNORMAL HIGH (ref <2.5)
Oxymorphone: NEGATIVE ng/mL (ref <2.5)
Phencyclidine: NEGATIVE ng/mL (ref <10)
Tapentadol: NEGATIVE ng/mL (ref <5.0)
Tramadol: NEGATIVE ng/mL (ref <5.0)
Zolpidem: NEGATIVE ng/mL (ref <5.0)

## 2022-08-17 LAB — DRUG TOX ALC METAB W/CON, ORAL FLD: Alcohol Metabolite: NEGATIVE ng/mL (ref <25)

## 2022-08-18 ENCOUNTER — Other Ambulatory Visit: Payer: Self-pay | Admitting: Family Medicine

## 2022-08-18 ENCOUNTER — Other Ambulatory Visit: Payer: Self-pay | Admitting: Physical Medicine and Rehabilitation

## 2022-08-18 MED ORDER — OXYCODONE HCL 5 MG PO TABS: 5.0000 mg | ORAL_TABLET | ORAL | 0 refills | Status: DC | PRN

## 2022-08-20 ENCOUNTER — Telehealth: Payer: Self-pay | Admitting: *Deleted

## 2022-08-20 ENCOUNTER — Telehealth: Payer: Self-pay

## 2022-08-20 NOTE — Telephone Encounter (Signed)
Oral swab drug screen was consistent for prescribed medications.  ?

## 2022-08-20 NOTE — Telephone Encounter (Signed)
Patient called stating the pharmacy filled Oxycodone 5 mg tablets. According to PMP and his chart, that is what was sent in. Is he weaning down? Please advise

## 2022-08-20 NOTE — Telephone Encounter (Signed)
Requested medication (s) are due for refill today:Due 08/31/22 (Mail order)  Requested medication (s) are on the active medication list: yes    Last refill: Both meds 02/28/22 with 3 month supply  Future visit scheduled no  Notes to clinic:Pt has multiple no shows and cancellations. Was to have F/U in June. Please review. Thank you.  Requested Prescriptions  Pending Prescriptions Disp Refills   PARoxetine (PAXIL) 20 MG tablet [Pharmacy Med Name: PARoxetine HCl 20 MG Oral Tablet] 90 tablet 3    Sig: TAKE 1 TABLET BY MOUTH AT  BEDTIME     Psychiatry:  Antidepressants - SSRI Passed - 08/18/2022  9:48 AM      Passed - Completed PHQ-2 or PHQ-9 in the last 360 days      Passed - Valid encounter within last 6 months    Recent Outpatient Visits           5 months ago Uncontrolled type 2 diabetes mellitus with hypoglycemia without coma ([place])   Primary Care at [facility], MD   7 months ago Encounter to establish care   Primary Care at [facility], Kriste Basque, NP               gemfibrozil (LOPID) 600 MG tablet [Pharmacy Med Name: Gemfibrozil 600 MG Oral Tablet] 180 tablet 3    Sig: TAKE 1 TABLET BY MOUTH TWICE  DAILY BEFORE A MEAL     Cardiovascular:  Antilipid - Fibric Acid Derivatives Failed - 08/18/2022  9:48 AM      Failed - HGB in normal range and within 360 days    Hemoglobin  Date Value Ref Range Status  10/01/2021 11.4 (L) 13.0 - 17.0 g/dL Final         Failed - HCT in normal range and within 360 days    HCT  Date Value Ref Range Status  10/01/2021 35.2 (L) 39.0 - 52.0 % Final         Failed - Lipid Panel in normal range within the last 12 months    Cholesterol  Date Value Ref Range Status  07/12/2021 212 (H) <200 mg/dL Final   LDL Cholesterol (Calc)  Date Value Ref Range Status  07/12/2021 114 (H) mg/dL (calc) Final    Comment:    Reference range: <100 . Desirable range <100 mg/dL for primary prevention;   <70 mg/dL for patients with  CHD or diabetic patients  with > or = 2 CHD risk factors. Marland Kitchen LDL-C is now calculated using the Martin-Hopkins  calculation, which is a validated novel method providing  better accuracy than the Friedewald equation in the  estimation of LDL-C.  Cresenciano Genre et al. Annamaria Helling. 6948;546(27): 2061-2068  (http://education.[facility].com/faq/FAQ164)    Direct LDL  Date Value Ref Range Status  08/26/2017 130.0 mg/dL Final    Comment:    Optimal:  <100 mg/dLNear or Above Optimal:  100-129 mg/dLBorderline High:  130-159 mg/dLHigh:  160-189 mg/dLVery High:  >190 mg/dL   HDL  Date Value Ref Range Status  07/12/2021 56 > OR = 40 mg/dL Final   Triglycerides  Date Value Ref Range Status  07/12/2021 315 (H) <150 mg/dL Final    Comment:    . If a non-fasting specimen was collected, consider repeat triglyceride testing on a fasting specimen if clinically indicated.  Yates Decamp et al. J. of Clin. Lipidol. 0350;0:938-182. Marland Kitchen          Passed - ALT in normal range and within 360 days  ALT  Date Value Ref Range Status  09/14/2021 14 0 - 44 U/L Final         Passed - AST in normal range and within 360 days    AST  Date Value Ref Range Status  09/14/2021 16 15 - 41 U/L Final         Passed - Cr in normal range and within 360 days    Creat  Date Value Ref Range Status  07/12/2021 0.93 0.60 - 1.29 mg/dL Final   Creatinine, Ser  Date Value Ref Range Status  06/03/2022 0.79 0.40 - 1.50 mg/dL Final   Creatinine,U  Date Value Ref Range Status  06/03/2022 64.2 mg/dL Final         Passed - PLT in normal range and within 360 days    Platelets  Date Value Ref Range Status  10/01/2021 285 150 - 400 K/uL Final         Passed - WBC in normal range and within 360 days    WBC  Date Value Ref Range Status  10/01/2021 6.0 4.0 - 10.5 K/uL Final         Passed - eGFR is 30 or above and within 360 days    GFR calc Af Amer  Date Value Ref Range Status  03/13/2020 >60 >60 mL/min Final    GFR, Estimated  Date Value Ref Range Status  10/01/2021 >60 >60 mL/min Final    Comment:    (NOTE) Calculated using the CKD-EPI Creatinine Equation (2021)    GFR  Date Value Ref Range Status  06/03/2022 104.02 >60.00 mL/min Final    Comment:    Calculated using the CKD-EPI Creatinine Equation (2021)         Passed - Valid encounter within last 12 months    Recent Outpatient Visits           5 months ago Uncontrolled type 2 diabetes mellitus with hypoglycemia without coma [facility])   Primary Care at [facility], MD   7 months ago Encounter to establish care   Primary Care at [facility], Kriste Basque, NP

## 2022-08-20 NOTE — Telephone Encounter (Signed)
I thought I sent in Oxycodone 10 mg- I sent exactly what I sent to other pharmacy, I thought- will have to have him refill in 2 weeks- Don't have a way to fix it right now- However please let pt know we will not be able to fix things on the weekends anymore- I am not usually on call and our policy is not not deal with opiates on the weekends, when we are on call, since we are dealing with very ill patients in the hospital- not the clinic- I filled it for him as a one time issue, but it's inappropriate to call the hospital to get meds filled- ML

## 2022-08-23 NOTE — Telephone Encounter (Signed)
Left voicemail to return call to clinic to explain the below information

## 2022-08-28 ENCOUNTER — Telehealth: Payer: Self-pay

## 2022-08-28 NOTE — Telephone Encounter (Signed)
Returning call about his medication

## 2022-08-29 MED ORDER — OXYCODONE HCL 10 MG PO TABS: 10.0000 mg | ORAL_TABLET | Freq: Four times a day (QID) | ORAL | 0 refills | Status: DC | PRN

## 2022-08-29 NOTE — Addendum Note (Signed)
Addended by: Courtney Heys on: 08/29/2022 09:46 AM   Modules accepted: Orders

## 2022-08-29 NOTE — Telephone Encounter (Signed)
Patient notified of medication sent to pharmacy

## 2022-08-29 NOTE — Telephone Encounter (Signed)
Patient is calling for refill on the Oxycodone 10 mg tablets. He is almost out of the 5 mg

## 2022-08-29 NOTE — Telephone Encounter (Signed)
Message sent to Dr. Dagoberto Ligas on previous encounter concerning medication refill

## 2022-08-31 ENCOUNTER — Other Ambulatory Visit: Payer: Self-pay | Admitting: Family

## 2022-08-31 DIAGNOSIS — E11649 Type 2 diabetes mellitus with hypoglycemia without coma: Secondary | ICD-10-CM

## 2022-09-02 ENCOUNTER — Telehealth: Payer: Self-pay | Admitting: Family Medicine

## 2022-09-02 NOTE — Telephone Encounter (Signed)
Referral Request - Has patient seen PCP for this complaint? No. *If NO, is insurance requiring patient see PCP for this issue before PCP can refer them? Referral for which specialty: urology Preferred provider/office: anyone but not [facility] Reason for referral: pt states he needs a prostate exam.  I attempted to make appt to discuss the referral, but pt refused.

## 2022-09-02 NOTE — Telephone Encounter (Signed)
Requested Prescriptions  Pending Prescriptions Disp Refills  . metFORMIN (GLUCOPHAGE) 1000 MG tablet [Pharmacy Med Name: metFORMIN HCl 1000 MG Oral Tablet] 60 tablet 11    Sig: TAKE 1 TABLET BY MOUTH TWICE  DAILY WITH MEALS     Endocrinology:  Diabetes - Biguanides Failed - 08/31/2022 10:54 PM      Failed - B12 Level in normal range and within 720 days    No results found for: "VITAMINB12"       Failed - Valid encounter within last 6 months    Recent Outpatient Visits          6 months ago Uncontrolled type 2 diabetes mellitus with hypoglycemia without coma ([place])   Primary Care at [facility], MD   7 months ago Encounter to establish care   Primary Care at [facility], Kriste Basque, NP             Passed - Cr in normal range and within 360 days    Creat  Date Value Ref Range Status  07/12/2021 0.93 0.60 - 1.29 mg/dL Final   Creatinine, Ser  Date Value Ref Range Status  06/03/2022 0.79 0.40 - 1.50 mg/dL Final   Creatinine,U  Date Value Ref Range Status  06/03/2022 64.2 mg/dL Final         Passed - HBA1C is between 0 and 7.9 and within 180 days    Hgb A1c MFr Bld  Date Value Ref Range Status  06/03/2022 7.5 (H) 4.6 - 6.5 % Final    Comment:    Glycemic Control Guidelines for People with Diabetes:Non Diabetic:  <6%Goal of Therapy: <7%Additional Action Suggested:  >8%          Passed - eGFR in normal range and within 360 days    GFR calc Af Amer  Date Value Ref Range Status  03/13/2020 >60 >60 mL/min Final   GFR, Estimated  Date Value Ref Range Status  10/01/2021 >60 >60 mL/min Final    Comment:    (NOTE) Calculated using the CKD-EPI Creatinine Equation (2021)    GFR  Date Value Ref Range Status  06/03/2022 104.02 >60.00 mL/min Final    Comment:    Calculated using the CKD-EPI Creatinine Equation (2021)         Passed - CBC within normal limits and completed in the last 12 months    WBC  Date Value Ref Range Status  10/01/2021 6.0  4.0 - 10.5 K/uL Final   RBC  Date Value Ref Range Status  10/01/2021 3.69 (L) 4.22 - 5.81 MIL/uL Final   Hemoglobin  Date Value Ref Range Status  10/01/2021 11.4 (L) 13.0 - 17.0 g/dL Final   HCT  Date Value Ref Range Status  10/01/2021 35.2 (L) 39.0 - 52.0 % Final   MCHC  Date Value Ref Range Status  10/01/2021 32.4 30.0 - 36.0 g/dL Final   [facility]  Date Value Ref Range Status  10/01/2021 30.9 26.0 - 34.0 pg Final   MCV  Date Value Ref Range Status  10/01/2021 95.4 80.0 - 100.0 fL Final   No results found for: "PLTCOUNTKUC", "LABPLAT", "POCPLA" RDW  Date Value Ref Range Status  10/01/2021 11.9 11.5 - 15.5 % Final

## 2022-09-04 NOTE — Telephone Encounter (Signed)
Yes

## 2022-09-05 ENCOUNTER — Other Ambulatory Visit: Payer: Self-pay | Admitting: Physical Medicine and Rehabilitation

## 2022-09-09 ENCOUNTER — Telehealth: Payer: Self-pay

## 2022-09-09 ENCOUNTER — Other Ambulatory Visit: Payer: Self-pay | Admitting: Physical Medicine and Rehabilitation

## 2022-09-09 NOTE — Telephone Encounter (Signed)
Pharmacy is requesting a 90 day supply with a year of refills. Is this appropriate?

## 2022-09-09 NOTE — Telephone Encounter (Signed)
Patient has been advise.
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
## 2022-09-09 NOTE — Telephone Encounter (Signed)
The sexual desire is the purview of his PCP-they need to check his testosterone level, etc-  in terms of getting erections, that's my "wheelhouse". Thanks- ML

## 2022-09-12 ENCOUNTER — Other Ambulatory Visit: Payer: Self-pay | Admitting: Family Medicine

## 2022-09-12 NOTE — Telephone Encounter (Signed)
Requested Prescriptions  Pending Prescriptions Disp Refills  . gemfibrozil (LOPID) 600 MG tablet [Pharmacy Med Name: Gemfibrozil 600 MG Oral Tablet] 180 tablet 0    Sig: TAKE 1 TABLET BY MOUTH TWICE  DAILY BEFORE A MEAL     Cardiovascular:  Antilipid - Fibric Acid Derivatives Failed - 09/12/2022  5:20 PM      Failed - ALT in normal range and within 360 days    ALT  Date Value Ref Range Status  09/14/2021 14 0 - 44 U/L Final         Failed - AST in normal range and within 360 days    AST  Date Value Ref Range Status  09/14/2021 16 15 - 41 U/L Final         Failed - HGB in normal range and within 360 days    Hemoglobin  Date Value Ref Range Status  10/01/2021 11.4 (L) 13.0 - 17.0 g/dL Final         Failed - HCT in normal range and within 360 days    HCT  Date Value Ref Range Status  10/01/2021 35.2 (L) 39.0 - 52.0 % Final         Failed - Lipid Panel in normal range within the last 12 months    Cholesterol  Date Value Ref Range Status  07/12/2021 212 (H) <200 mg/dL Final   LDL Cholesterol (Calc)  Date Value Ref Range Status  07/12/2021 114 (H) mg/dL (calc) Final    Comment:    Reference range: <100 . Desirable range <100 mg/dL for primary prevention;   <70 mg/dL for patients with CHD or diabetic patients  with > or = 2 CHD risk factors. Marland Kitchen LDL-C is now calculated using the Martin-Hopkins  calculation, which is a validated novel method providing  better accuracy than the Friedewald equation in the  estimation of LDL-C.  Cresenciano Genre et al. Annamaria Helling. 7989;211(94): 2061-2068  (http://education.[facility].com/faq/FAQ164)    Direct LDL  Date Value Ref Range Status  08/26/2017 130.0 mg/dL Final    Comment:    Optimal:  <100 mg/dLNear or Above Optimal:  100-129 mg/dLBorderline High:  130-159 mg/dLHigh:  160-189 mg/dLVery High:  >190 mg/dL   HDL  Date Value Ref Range Status  07/12/2021 56 > OR = 40 mg/dL Final   Triglycerides  Date Value Ref Range Status   07/12/2021 315 (H) <150 mg/dL Final    Comment:    . If a non-fasting specimen was collected, consider repeat triglyceride testing on a fasting specimen if clinically indicated.  Yates Decamp et al. J. of Clin. Lipidol. 1740;8:144-818. Marland Kitchen          Passed - Cr in normal range and within 360 days    Creat  Date Value Ref Range Status  07/12/2021 0.93 0.60 - 1.29 mg/dL Final   Creatinine, Ser  Date Value Ref Range Status  06/03/2022 0.79 0.40 - 1.50 mg/dL Final   Creatinine,U  Date Value Ref Range Status  06/03/2022 64.2 mg/dL Final         Passed - PLT in normal range and within 360 days    Platelets  Date Value Ref Range Status  10/01/2021 285 150 - 400 K/uL Final         Passed - WBC in normal range and within 360 days    WBC  Date Value Ref Range Status  10/01/2021 6.0 4.0 - 10.5 K/uL Final         Passed - eGFR is  30 or above and within 360 days    GFR calc Af Amer  Date Value Ref Range Status  03/13/2020 >60 >60 mL/min Final   GFR, Estimated  Date Value Ref Range Status  10/01/2021 >60 >60 mL/min Final    Comment:    (NOTE) Calculated using the CKD-EPI Creatinine Equation (2021)    GFR  Date Value Ref Range Status  06/03/2022 104.02 >60.00 mL/min Final    Comment:    Calculated using the CKD-EPI Creatinine Equation (2021)         Passed - Valid encounter within last 12 months    Recent Outpatient Visits          6 months ago Uncontrolled type 2 diabetes mellitus with hypoglycemia without coma [facility])   Primary Care at [facility], MD   7 months ago Encounter to establish care   Primary Care at [facility], Kriste Basque, NP      Future Appointments            In 1 week Dorna Mai, MD Primary Care at [facility]

## 2022-09-16 ENCOUNTER — Telehealth: Payer: Self-pay | Admitting: *Deleted

## 2022-09-16 ENCOUNTER — Telehealth: Payer: Self-pay

## 2022-09-16 ENCOUNTER — Other Ambulatory Visit: Payer: Self-pay

## 2022-09-16 DIAGNOSIS — E11649 Type 2 diabetes mellitus with hypoglycemia without coma: Secondary | ICD-10-CM

## 2022-09-16 MED ORDER — RYBELSUS 7 MG PO TABS: 1.0000 | ORAL_TABLET | Freq: Every day | ORAL | 0 refills | Status: DC

## 2022-09-16 NOTE — Telephone Encounter (Signed)
Patient is calling to see if he can get an increase of his pain medication by at least one more a day. He states the current dose is not helping.

## 2022-09-16 NOTE — Telephone Encounter (Signed)
Formal warning sent for inappropriate behavior in requesting medication (opioid) refill from MD over weekend and while on call dealing with emergencies at hospital.

## 2022-09-16 NOTE — Telephone Encounter (Signed)
I'm sorry, but as we've discussed, I do not feel comfortable going up on your pain meds- we have increased the dose since you left the hospital- and my goal was to wean you off the medicine by this time 80% or more of my walking SCI patients do not continue to need pain meds at this time and I do not increase when my plan is to decrease pain meds- thank you- your medication isn't due until 09/28/22 fyi

## 2022-09-17 NOTE — Telephone Encounter (Signed)
Patient notified

## 2022-09-24 ENCOUNTER — Telehealth: Payer: Self-pay

## 2022-09-24 ENCOUNTER — Ambulatory Visit (INDEPENDENT_AMBULATORY_CARE_PROVIDER_SITE_OTHER): Payer: No Typology Code available for payment source | Admitting: Family Medicine

## 2022-09-24 DIAGNOSIS — N401 Enlarged prostate with lower urinary tract symptoms: Secondary | ICD-10-CM | POA: Diagnosis not present

## 2022-09-24 DIAGNOSIS — B351 Tinea unguium: Secondary | ICD-10-CM

## 2022-09-24 DIAGNOSIS — Z794 Long term (current) use of insulin: Secondary | ICD-10-CM | POA: Diagnosis not present

## 2022-09-24 DIAGNOSIS — E1169 Type 2 diabetes mellitus with other specified complication: Secondary | ICD-10-CM

## 2022-09-24 DIAGNOSIS — R3916 Straining to void: Secondary | ICD-10-CM

## 2022-09-24 MED ORDER — TERBINAFINE HCL 250 MG PO TABS: 250.0000 mg | ORAL_TABLET | Freq: Every day | ORAL | 2 refills | Status: DC

## 2022-09-24 NOTE — Telephone Encounter (Signed)
Dr. Dagoberto Ligas is out of the office: Patient has #13 on hand. Please send to [facility] on [address].   Filled  Written  ID  Drug  QTY  Days  Prescriber  RX #  Dispenser  Refill  Daily Dose*  Pymt Type  PMP  08/30/2022 08/29/2022 2  Oxycodone Hcl (Ir) 10 Mg Tab 120.00 30 Me Lov [id number] Nor ([id number]) 0/0 60.00 MME Comm Ins [place] 08/18/2022 08/18/2022 2  Oxycodone Hcl (Ir) 5 Mg Tablet 120.00 20 Me Lov [id number] Nor ([id number]) 0/0 45.00 MME Comm Ins [place] 07/19/2022 07/17/2022 2  Oxycodone Hcl (Ir) 10 Mg Tab 120.00 30 Me Lov [id number] Nor (2372) 0/0 60.00 MME Comm Ins [place]

## 2022-09-24 NOTE — Telephone Encounter (Signed)
PMP was Reviewed.  Oxycodone 5 mg tablets was e-scribed with 120 tablets on 08/18/2022. Dr Dagoberto Ligas note was reviewed, she  realize she sent in Oxycodone 5 mg and not Oxycodone 10 mg.  He was dispensed 120 tablets of Oxycodone 5 mg = 15 day prescription. , next refill should have been on 09/01/2022.  According to the PMP Oxycodone 10 mg / 120 tablets should have been started on 09/01/2022.  This provider spoke with [facility] Pharmacist and we reviewed the above. This provider called Carlos [name] to discussed the above, he should not be out of medication  today. This message will be sent to Dr Dagoberto Ligas to address on Monday, he verbalizes understanding.

## 2022-09-26 ENCOUNTER — Encounter: Payer: Self-pay | Admitting: Family Medicine

## 2022-09-26 NOTE — Progress Notes (Signed)
Established Patient Office Visit  Subjective    Patient ID: Carlos Lamadrid., male    DOB: Jul 06, 1971  Age: 51 y.o. MRN: [id number]  CC: No chief complaint on file.   HPI Carlos [name]. presents for follow up of diabetes. Patient also reports that he has started to have difficulty urinating and believes he may be due to his enlarged prostate. He also reports toenail fungus.    Outpatient Encounter Medications as of 09/24/2022  Medication Sig   terbinafine (LAMISIL) 250 MG tablet Take 1 tablet (250 mg total) by mouth daily.   acetaminophen (TYLENOL) 325 MG tablet Take 1-2 tablets (325-650 mg total) by mouth every 4 (four) hours as needed for mild pain.   aspirin EC 81 MG EC tablet Take 1 tablet (81 mg total) by mouth daily. Swallow whole.   B Complex Vitamins (VITAMIN B COMPLEX) TABS Take1 tablet by mouth daily.   baclofen (LIORESAL) 20 MG tablet Take 2 tablets (40 mg total) by mouth 3 (three) times daily. For spasticity   docusate sodium (COLACE) 100 MG capsule Take 100 mg by mouth 2 (two) times daily.   gabapentin (NEURONTIN) 100 MG capsule TAKE 2 CAPSULES BY MOUTH 3 TIMES DAILY   gemfibrozil (LOPID) 600 MG tablet TAKE 1 TABLET BY MOUTH TWICE  DAILY BEFORE A MEAL   meloxicam (MOBIC) 15 MG tablet Take 1 tablet (15 mg total) by mouth daily.   metFORMIN (GLUCOPHAGE) 1000 MG tablet TAKE 1 TABLET BY MOUTH TWICE  DAILY WITH MEALS   mirtazapine (REMERON) 15 MG tablet TAKE 1 TABLET BY MOUTH DAILY AT  BEDTIME   Multiple Vitamin (MULTIVITAMIN WITH MINERALS) TABS tablet Take 1 tablet by mouth daily.   Oxycodone HCl 10 MG TABS Take 1 tablet (10 mg total) by mouth 4 (four) times daily as needed. Do not refill until 08/30/22   PARoxetine (PAXIL) 20 MG tablet Take 1 tablet (20 mg total) by mouth at bedtime.   Semaglutide (RYBELSUS) 7 MG TABS Take 1 tablet by mouth daily before breakfast. Take 30 minutes before breakfast with 4 oz. water   tamsulosin (FLOMAX) 0.4 MG CAPS capsule Take 1 capsule  (0.4 mg total) by mouth at bedtime.   traZODone (DESYREL) 50 MG tablet TAKE 1-2 TABLETS (50-100 MG TOTAL) BY MOUTH AT BEDTIME. FOR SLEEP   No facility-administered encounter medications on file as of 09/24/2022.    Past Medical History:  Diagnosis Date   ADHD (attention deficit hyperactivity disorder)    Alcohol abuse    Depression    Diabetes mellitus without complication ([place])    History of exercise stress test    03-18-2013--  normal   History of kidney stones    Hyperlipidemia    Kidney stones    Left ureteral stone    Type 2 diabetes mellitus [facility])     Past Surgical History:  Procedure Laterality Date   ANTERIOR CERVICAL DECOMP/DISCECTOMY FUSION N/A 08/30/2021   Procedure: CERVICAL FIVE-SIX ANTERIOR CERVICAL DECOMPRESSION/DISCECTOMY FUSION;  Surgeon: Vallarie Mare, MD;  Location: [place];  Service: Neurosurgery;  Laterality: N/A;   CARDIAC CATHETERIZATION  11-27-2006  dr Verlon Setting   non-obstructive CAD/  20% proximal and mid LAD/  perserved LVF,  ef 55-60%   CYSTOSCOPY W/ RETROGRADES Left 05/17/2015   Procedure: CYSTOSCOPY WITH RETROGRADE PYELOGRAM;  Surgeon: Festus Aloe, MD;  Location: [facility];  Service: Urology;  Laterality: Left;   CYSTOSCOPY/URETEROSCOPY/HOLMIUM LASER/STENT PLACEMENT Left 05/17/2015   Procedure: LEFT URETEROSCOPY/HOLMIUM LASER/STENT PLACEMENT;  Surgeon: Festus Aloe, MD;  Location: [facility];  Service: Urology;  Laterality: Left;   EXTRACORPOREAL SHOCK WAVE LITHOTRIPSY Left 05-08-2015   EXTRACTION RIGHT MANDIBULAR , PREMOLAR/  MAXILLARY MANDIBULAR FIXATION WITH SCREWS  08-03-2008   LUMBAR PERCUTANEOUS PEDICLE SCREW 4 LEVEL N/A 09/04/2021   Procedure: Thoracic Four-Thoracic Eight  Percutaneous Instrumented Fusion;  Surgeon: Vallarie Mare, MD;  Location: [place];  Service: Neurosurgery;  Laterality: N/A;   ORIF FOUR HOLD PLATE AND MAXILLOMANDIBULAR FIXATION W/ ARCH BARS  08-05-2008   REMOVAL ARCH BARS 09-15-2008    TRANSTHORACIC ECHOCARDIOGRAM  04-06-2008  dr Verlon Setting   normal LVF,  ef 55-60%,  trivial MR and TR    Family History  Problem Relation Age of Onset   Cancer Mother    Diabetes Mother        type 1   Heart disease Mother        pacemaker   Cancer - Colon Mother    Alcohol abuse Father    Alcohol abuse Maternal Uncle    Diabetes Maternal Grandmother    Alcohol abuse Maternal Grandfather    Diabetes Maternal Grandfather    Prostate cancer Neg Hx     Social History   Socioeconomic History   Marital status: Married    Spouse name: Not on file   Number of children: Not on file   Years of education: Not on file   Highest education level: Not on file  Occupational History   Not on file  Tobacco Use   Smoking status: Never   Smokeless tobacco: Never  Vaping Use   Vaping Use: Never used  Substance and Sexual Activity   Alcohol use: Yes    Alcohol/week: 10.0 standard drinks of alcohol    Types: 10 Cans of beer per week    Comment: casual   Drug use: No   Sexual activity: Not on file  Other Topics Concern   Not on file  Social History Narrative   ** Merged History Encounter **       Social Determinants of Health   Financial Resource Strain: Not on file  Food Insecurity: Not on file  Transportation Needs: Not on file  Physical Activity: Not on file  Stress: Not on file  Social Connections: Not on file  Intimate Partner Violence: Not on file    Review of Systems  Genitourinary:  Negative for dysuria.  All other systems reviewed and are negative.       Objective    There were no vitals taken for this visit.  Physical Exam Vitals and nursing note reviewed.  Constitutional:      General: He is not in acute distress. Cardiovascular:     Rate and Rhythm: Normal rate and regular rhythm.  Pulmonary:     Effort: Pulmonary effort is normal.     Breath sounds: Normal breath sounds.  Abdominal:     Palpations: Abdomen is soft.     Tenderness: There is no  abdominal tenderness.  Skin:    Comments: Several toenails with discoloration and thickening.  Neurological:     General: No focal deficit present.     Mental Status: He is alert and oriented to person, place, and time.         Assessment & Plan:   1. Type 2 diabetes mellitus with other specified complication, with long-term current use of insulin (HCC) Continue and monitor  2. Toenail fungus Lamisil prescribed  3. Benign prostatic hyperplasia (BPH) with straining  on urination Referral to urology for further eval/mgt - Ambulatory referral to Urology    No follow-ups on file.   Becky Sax, MD

## 2022-09-29 ENCOUNTER — Other Ambulatory Visit: Payer: Self-pay | Admitting: Physical Medicine and Rehabilitation

## 2022-09-30 ENCOUNTER — Encounter: Payer: Self-pay | Admitting: Physical Medicine and Rehabilitation

## 2022-09-30 MED ORDER — OXYCODONE HCL 10 MG PO TABS: 10.0000 mg | ORAL_TABLET | Freq: Four times a day (QID) | ORAL | 0 refills | Status: DC | PRN

## 2022-10-05 ENCOUNTER — Emergency Department (HOSPITAL_COMMUNITY)
Admission: EM | Admit: 2022-10-05 | Discharge: 2022-10-05 | Discharge status: Against Medical Advice | Payer: No Typology Code available for payment source | Attending: Emergency Medicine | Admitting: Emergency Medicine

## 2022-10-05 ENCOUNTER — Other Ambulatory Visit: Payer: Self-pay

## 2022-10-05 ENCOUNTER — Encounter (HOSPITAL_COMMUNITY): Payer: Self-pay

## 2022-10-05 ENCOUNTER — Emergency Department (HOSPITAL_COMMUNITY): Payer: No Typology Code available for payment source

## 2022-10-05 DIAGNOSIS — W231XXA Caught, crushed, jammed, or pinched between stationary objects, initial encounter: Secondary | ICD-10-CM | POA: Diagnosis not present

## 2022-10-05 DIAGNOSIS — Z7982 Long term (current) use of aspirin: Secondary | ICD-10-CM | POA: Diagnosis not present

## 2022-10-05 DIAGNOSIS — Z23 Encounter for immunization: Secondary | ICD-10-CM | POA: Diagnosis not present

## 2022-10-05 DIAGNOSIS — S62633A Displaced fracture of distal phalanx of left middle finger, initial encounter for closed fracture: Secondary | ICD-10-CM | POA: Insufficient documentation

## 2022-10-05 DIAGNOSIS — E119 Type 2 diabetes mellitus without complications: Secondary | ICD-10-CM | POA: Insufficient documentation

## 2022-10-05 DIAGNOSIS — Z7984 Long term (current) use of oral hypoglycemic drugs: Secondary | ICD-10-CM | POA: Insufficient documentation

## 2022-10-05 DIAGNOSIS — S6992XA Unspecified injury of left wrist, hand and finger(s), initial encounter: Secondary | ICD-10-CM | POA: Diagnosis present

## 2022-10-05 MED ORDER — NAPROXEN 500 MG PO TABS: 500.0000 mg | ORAL_TABLET | Freq: Two times a day (BID) | ORAL | 0 refills | Status: DC

## 2022-10-05 MED ORDER — OXYCODONE-ACETAMINOPHEN 5-325 MG PO TABS
1.0000 | ORAL_TABLET | Freq: Once | ORAL | Status: AC
  Administered 2022-10-05: 1 via ORAL
  Filled 2022-10-05: qty 1

## 2022-10-05 MED ORDER — TETANUS-DIPHTH-ACELL PERTUSSIS 5-2.5-18.5 LF-MCG/0.5 IM SUSY
0.5000 mL | PREFILLED_SYRINGE | Freq: Once | INTRAMUSCULAR | Status: AC
  Administered 2022-10-05: 0.5 mL via INTRAMUSCULAR
  Filled 2022-10-05: qty 0.5

## 2022-10-05 MED ORDER — BACITRACIN ZINC 500 UNIT/GM EX OINT
TOPICAL_OINTMENT | CUTANEOUS | Status: AC
  Filled 2022-10-05: qty 1.8

## 2022-10-05 NOTE — ED Triage Notes (Signed)
Patient reports that he smashed his right hand in a wood splitter today. Patient has swelling present.

## 2022-10-05 NOTE — Discharge Instructions (Addendum)
Imaging has indicated a small minimally displaced fracture of your left middle finger.  You have been provided a splint to keep this immobilized.  Continue to wear this until able to follow-up with the hand orthopedic specialist.  I provided the contact information for Dr. Caralyn Guile, please call first thing Monday morning to be seen within the next 2 to 3 days for reevaluation and continued medical management.  Continue to utilize anti-inflammatories such as naproxen or meloxicam, as well as RICE therapy for pain management until follow-up.  Return to the ED for any new or worsening symptoms as discussed.

## 2022-10-05 NOTE — ED Notes (Addendum)
Patient left before provider was able to suture. Patient left without bacitracin, bandage, and splint could be done.

## 2022-10-05 NOTE — ED Provider Notes (Cosign Needed Addendum)
[facility] DEPT Provider Note   CSN: [id number] Arrival date & time: 10/05/22  1132     History  No chief complaint on file.   Carlos Pandy Sebasthian Stailey. is a 51 y.o. male with Hx of alcohol abuse, dyslipidemia, ADD, DMT2, depression, anxiety.  Presenting today with an acute injury to the left hand that occurred approximately 1 hour ago.  States fingers were caught between 2 objects while splitting wood.  He was putting a piece of wood down when his father swung down early to split the wood.  States his index finger, middle finger, and ring finger got caught between the wood and the metal stump.  Felt immediate pain.  Denies spurting blood, numbness, tingling, or amputation.  Notices some increasing swelling and tenderness.  The history is provided by the patient and medical records.      Home Medications Prior to Admission medications   Medication Sig Start Date End Date Taking? Authorizing Provider  naproxen (NAPROSYN) 500 MG tablet Take 1 tablet (500 mg total) by mouth 2 (two) times daily. 10/05/22  Yes Prince Rome, PA-C  acetaminophen (TYLENOL) 325 MG tablet Take 1-2 tablets (325-650 mg total) by mouth every 4 (four) hours as needed for mild pain. 10/03/21   Angiulli, Lavon Paganini, PA-C  aspirin EC 81 MG EC tablet Take 1 tablet (81 mg total) by mouth daily. Swallow whole. 10/03/21   Angiulli, Lavon Paganini, PA-C  B Complex Vitamins (VITAMIN B COMPLEX) TABS Take1 tablet by mouth daily. 10/03/21   Angiulli, Lavon Paganini, PA-C  baclofen (LIORESAL) 20 MG tablet Take 2 tablets (40 mg total) by mouth 3 (three) times daily. For spasticity 08/14/22   Lovorn, Jinny Blossom, MD  docusate sodium (COLACE) 100 MG capsule Take 100 mg by mouth 2 (two) times daily.    [provider]  gabapentin (NEURONTIN) 100 MG capsule TAKE 2 CAPSULES BY MOUTH 3 TIMES DAILY 03/05/22   Dorna Mai, MD  gemfibrozil (LOPID) 600 MG tablet TAKE 1 TABLET BY MOUTH TWICE  DAILY BEFORE A MEAL 09/12/22    Dorna Mai, MD  metFORMIN (GLUCOPHAGE) 1000 MG tablet TAKE 1 TABLET BY MOUTH TWICE  DAILY WITH MEALS 09/02/22   Dorna Mai, MD  mirtazapine (REMERON) 15 MG tablet TAKE 1 TABLET BY MOUTH DAILY AT  BEDTIME 09/09/22   Lovorn, Jinny Blossom, MD  Multiple Vitamin (MULTIVITAMIN WITH MINERALS) TABS tablet Take 1 tablet by mouth daily. 10/03/21   Angiulli, Lavon Paganini, PA-C  Oxycodone HCl 10 MG TABS Take 1 tablet (10 mg total) by mouth 4 (four) times daily as needed. Do not refill until 08/30/22 09/30/22   Lovorn, Jinny Blossom, MD  PARoxetine (PAXIL) 20 MG tablet Take 1 tablet (20 mg total) by mouth at bedtime. 02/28/22   Dorna Mai, MD  Semaglutide (RYBELSUS) 7 MG TABS Take 1 tablet by mouth daily before breakfast. Take 30 minutes before breakfast with 4 oz. water 09/16/22   Elayne Snare, MD  tamsulosin (FLOMAX) 0.4 MG CAPS capsule Take 1 capsule (0.4 mg total) by mouth at bedtime. 08/14/22   Lovorn, Jinny Blossom, MD  terbinafine (LAMISIL) 250 MG tablet Take 1 tablet (250 mg total) by mouth daily. 09/24/22   Dorna Mai, MD  traZODone (DESYREL) 50 MG tablet TAKE 1-2 TABLETS (50-100 MG TOTAL) BY MOUTH AT BEDTIME. FOR SLEEP 09/05/22   Lovorn, Jinny Blossom, MD      Allergies    Ambien [zolpidem tartrate]    Review of Systems   Review of Systems  Musculoskeletal:  Acute left hand pain    Physical Exam Updated Vital Signs BP 128/81 (BP Location: Right Arm)   Pulse 85   Temp 98.1 F (36.7 C) (Oral)   Resp 18   Ht '6\' 4"'$  (1.93 m)   Wt 70.3 kg   SpO2 98%   BMI 18.87 kg/m  Physical Exam Vitals and nursing note reviewed.  Constitutional:      General: He is not in acute distress.    Appearance: He is well-developed. He is not ill-appearing or toxic-appearing.  HENT:     Head: Normocephalic and atraumatic.  Eyes:     Conjunctiva/sclera: Conjunctivae normal.  Cardiovascular:     Rate and Rhythm: Normal rate and regular rhythm.     Heart sounds: No murmur heard. Pulmonary:     Effort: Pulmonary effort is  normal. No respiratory distress.     Breath sounds: Normal breath sounds.  Abdominal:     Palpations: Abdomen is soft.     Tenderness: There is no abdominal tenderness.  Musculoskeletal:        General: No swelling.     Cervical back: Neck supple.     Comments: See photos. Mild reduced ROM of left index, middle, and ring fingers, middle worst.  CRT < 2.    Skin:    General: Skin is warm and dry.     Capillary Refill: Capillary refill takes less than 2 seconds.  Neurological:     Mental Status: He is alert.  Psychiatric:        Mood and Affect: Mood normal.           ED Results / Procedures / Treatments   Labs (all labs ordered are listed, but only abnormal results are displayed) Labs Reviewed - No data to display  EKG None  Radiology DG Hand Complete Left  Result Date: 10/05/2022 CLINICAL DATA:  Hand pain EXAM: LEFT HAND - COMPLETE 3+ VIEW COMPARISON:  None Available. FINDINGS: Minimally displaced transverse fracture of the third distal phalanx, with surrounding soft tissue swelling. No other fractures identified. Normal mineralization. IMPRESSION: Minimally displaced fracture of the third distal phalanx. Electronically Signed   By: Albin Felling M.D.   On: 10/05/2022 13:30    Procedures Procedures    Medications Ordered in ED Medications  bacitracin 500 UNIT/GM ointment (has no administration in time range)  Tdap (BOOSTRIX) injection 0.5 mL (0.5 mLs Intramuscular Given 10/05/22 1255)  oxyCODONE-acetaminophen (PERCOCET/ROXICET) 5-325 MG per tablet 1 tablet (1 tablet Oral Given 10/05/22 1324)    ED Course/ Medical Decision Making/ A&P                           Medical Decision Making Amount and/or Complexity of Data Reviewed Radiology: ordered.  Risk Prescription drug management.   51 y.o. male presents to the ED for concern of No chief complaint on file.   This involves an extensive number of treatment options, and is a complaint that carries with it a  high risk of complications and morbidity.  The emergent differential diagnosis prior to evaluation includes, but is not limited to: Fracture, contusion, dislocation, sprain, laceration  This is not an exhaustive differential.   Past Medical History / Co-morbidities / Social History: Hx of alcohol use, dyslipidemia, ADD, DMT2, depression, anxiety Social Determinants of Health include: alcohol use   Additional History:  None.  Lab Tests: None.  Imaging Studies: I ordered imaging studies including XR Left hand with imaging of  left ring, middle, and index fingers.   I independently visualized and interpreted imaging which showed minimally displaced fracture of the third distal phalanx I agree with the radiologist interpretation.  ED Course: Pt well-appearing on exam.  Resenting today with acute injury to the left hand/fingers.  Reports tenderness and injury of the index, middle, and ring finger.  See photos for details.  They appear neurovascularly intact.  Tissue soft, swelling and edema mild to moderate.  Also with scattered superficial abrasions that do not initially appear appropriate for suturing, stapling, or tissue adhesive.  Patient's ring on left ring finger removed using tourniquet method without complication.  Tdap updated.  No evidence of ischemia or compartment syndrome.   Pt X-Ray with small minimally displaced fracture of the third distal phalanx. Pain managed in ED.  Extremity appears neurovascularly intact.  Small 1-2cm laceration to the lateral aspect of left middle finger appreciated in greater detail after extensive cleaning of the wound, and I believe would benefit from placement of 1-2 sutures.  Pt advised to follow up with orthopedic hand specialist for further evaluation and treatment within the next 2-4 days.  Ordered finger splint and printed home management education while in ED.  Recommended RICE therapy and conservative symptom management until orthopedic follow up.   Allowing pt's hand to dry from saline-iodine soak while gathering wound care supplies. Gathering suturing supplies when informed by nursing staff that pt had decided to elope.  Unable to suture laceration of the finger, provide finger splint, or dress the abrasions as discussed with pt.  AVS printed however pt left without receiving this as well.  Per nursing staff, pt appeared to be in NAD at time of elopement.  Disposition: Elopement  I discussed this case with my attending physician Dr. Ronnald Nian, who agreed with the proposed treatment course and cosigned this note including patient's presenting symptoms, physical exam, and planned diagnostics and interventions.  Attending physician stated agreement with plan or made changes to plan which were implemented.     This chart was dictated using voice recognition software.  Despite best efforts to proofread, errors can occur which can change the documentation meaning.         Final Clinical Impression(s) / ED Diagnoses Final diagnoses:  Closed displaced fracture of distal phalanx of left middle finger, initial encounter    Rx / DC Orders ED Discharge Orders          Ordered    naproxen (NAPROSYN) 500 MG tablet  2 times daily        10/05/22 1412              Prince Rome, PA-C 83/66/29 4765    Prince Rome, PA-C 46/50/35 4656    Prince Rome, PA-C 81/27/51 1619    Lennice Sites, DO 10/06/22 [phone]

## 2022-10-09 ENCOUNTER — Ambulatory Visit (INDEPENDENT_AMBULATORY_CARE_PROVIDER_SITE_OTHER): Payer: No Typology Code available for payment source | Admitting: Urology

## 2022-10-09 ENCOUNTER — Encounter: Payer: Self-pay | Admitting: Urology

## 2022-10-09 ENCOUNTER — Other Ambulatory Visit (INDEPENDENT_AMBULATORY_CARE_PROVIDER_SITE_OTHER): Payer: No Typology Code available for payment source

## 2022-10-09 VITALS — BP 125/82 | HR 78 | Ht 76.0 in | Wt 157.0 lb

## 2022-10-09 DIAGNOSIS — R350 Frequency of micturition: Secondary | ICD-10-CM | POA: Diagnosis not present

## 2022-10-09 DIAGNOSIS — E11649 Type 2 diabetes mellitus with hypoglycemia without coma: Secondary | ICD-10-CM

## 2022-10-09 DIAGNOSIS — Z125 Encounter for screening for malignant neoplasm of prostate: Secondary | ICD-10-CM | POA: Diagnosis not present

## 2022-10-09 DIAGNOSIS — E782 Mixed hyperlipidemia: Secondary | ICD-10-CM

## 2022-10-09 LAB — LIPID PANEL
Cholesterol: 209 mg/dL — ABNORMAL HIGH (ref 0–200)
HDL: 44.9 mg/dL (ref >39.00)
LDL Cholesterol: 150 mg/dL — ABNORMAL HIGH (ref 0–99)
NonHDL: 163.81
Total CHOL/HDL Ratio: 5
Triglycerides: 68 mg/dL (ref 0.0–149.0)
VLDL: 13.6 mg/dL (ref 0.0–40.0)

## 2022-10-09 LAB — URINALYSIS, COMPLETE
Bilirubin, UA: NEGATIVE
Glucose, UA: NEGATIVE
Ketones, UA: NEGATIVE
Leukocytes,UA: NEGATIVE
Nitrite, UA: NEGATIVE
Protein,UA: NEGATIVE
RBC, UA: NEGATIVE
Specific Gravity, UA: 1.015 (ref 1.005–1.030)
Urobilinogen, Ur: 0.2 mg/dL (ref 0.2–1.0)
pH, UA: 5 (ref 5.0–7.5)

## 2022-10-09 LAB — COMPREHENSIVE METABOLIC PANEL
ALT: 10 U/L (ref 0–53)
AST: 14 U/L (ref 0–37)
Albumin: 4.7 g/dL (ref 3.5–5.2)
Alkaline Phosphatase: 66 U/L (ref 39–117)
BUN: 13 mg/dL (ref 6–23)
CO2: 31 mEq/L (ref 19–32)
Calcium: 9.7 mg/dL (ref 8.4–10.5)
Chloride: 93 mEq/L — ABNORMAL LOW (ref 96–112)
Creatinine, Ser: 0.7 mg/dL (ref 0.40–1.50)
GFR: 107.62 mL/min (ref >60.00)
Glucose, Bld: 155 mg/dL — ABNORMAL HIGH (ref 70–99)
Potassium: 4.2 mEq/L (ref 3.5–5.1)
Sodium: 131 mEq/L — ABNORMAL LOW (ref 135–145)
Total Bilirubin: 0.5 mg/dL (ref 0.2–1.2)
Total Protein: 7.7 g/dL (ref 6.0–8.3)

## 2022-10-09 LAB — MICROSCOPIC EXAMINATION

## 2022-10-09 LAB — BLADDER SCAN AMB NON-IMAGING: Scan Result: 259

## 2022-10-09 LAB — HEMOGLOBIN A1C: Hgb A1c MFr Bld: 8.1 % — ABNORMAL HIGH (ref 4.6–6.5)
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
## 2022-10-10 LAB — PSA: Prostate Specific Ag, Serum: 0.2 ng/mL (ref 0.0–4.0)

## 2022-10-11 ENCOUNTER — Encounter: Payer: Self-pay | Admitting: Endocrinology

## 2022-10-11 ENCOUNTER — Ambulatory Visit (INDEPENDENT_AMBULATORY_CARE_PROVIDER_SITE_OTHER): Payer: No Typology Code available for payment source | Admitting: Endocrinology

## 2022-10-11 VITALS — BP 120/76 | HR 83 | Ht 76.0 in | Wt 155.0 lb

## 2022-10-11 DIAGNOSIS — E1165 Type 2 diabetes mellitus with hyperglycemia: Secondary | ICD-10-CM

## 2022-10-11 DIAGNOSIS — E782 Mixed hyperlipidemia: Secondary | ICD-10-CM | POA: Diagnosis not present

## 2022-10-11 MED ORDER — INSULIN PEN NEEDLE 31G X 5 MM MISC: 1 refills | Status: DC

## 2022-10-11 MED ORDER — ATORVASTATIN CALCIUM 10 MG PO TABS: 10.0000 mg | ORAL_TABLET | Freq: Every day | ORAL | 3 refills | Status: DC

## 2022-10-11 MED ORDER — TOUJEO SOLOSTAR 300 UNIT/ML ~~LOC~~ SOPN: 20.0000 [IU] | PEN_INJECTOR | Freq: Every day | SUBCUTANEOUS | 3 refills | Status: DC

## 2022-10-11 NOTE — Patient Instructions (Addendum)
Take Gemfibrozil 1x daily   Toujeo insulin: This insulin provides blood sugar control for up to 24 hours.   Start with 8 units at bedtime daily and increase by 2 units every 3 days until the waking up sugars are under 130. Then continue the same dose.  If blood sugar is under 90 for 2 days in a row, reduce the dose by 2 units.  Note that this insulin does not control the rise of blood sugar with meals
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
## 2022-10-21 ENCOUNTER — Encounter: Payer: No Typology Code available for payment source | Admitting: Physical Medicine and Rehabilitation

## 2022-10-23 ENCOUNTER — Telehealth: Payer: Self-pay

## 2022-10-23 ENCOUNTER — Other Ambulatory Visit: Payer: Self-pay

## 2022-10-23 MED ORDER — DEXCOM G7 SENSOR MISC: 2 refills | Status: DC

## 2022-10-23 NOTE — Telephone Encounter (Signed)
Patient left vm wanting to try Dexcom G7. Informed patient he could stop by office and we can give him a sample to try. He will use his phone for Reader. I also sent in Rx to pharmacy so that he will know how much it is if it is something he wants to move forward with.

## 2022-10-24 NOTE — Telephone Encounter (Signed)
Patient picked up sample of Dexcom G7 today.

## 2022-10-28 ENCOUNTER — Other Ambulatory Visit: Payer: Self-pay | Admitting: Physical Medicine and Rehabilitation

## 2022-10-28 ENCOUNTER — Other Ambulatory Visit: Payer: Self-pay

## 2022-10-28 NOTE — Telephone Encounter (Signed)
PMP Report:   Filled  Written  ID  Drug  QTY  Days  Prescriber  RX #  Dispenser  Refill  Daily Dose*  Pymt Type  PMP  09/30/2022 09/30/2022 2  Oxycodone Hcl (Ir) 10 Mg Tab 120.00 30 Me Lov [id number] Nor (2372) 0/0 60.00 MME Comm Ins [place] 08/30/2022 08/29/2022 2  Oxycodone Hcl (Ir) 10 Mg Tab 120.00 30 Me Lov [id number] Nor (2372) 0/0 60.00 MME Comm Ins [place]

## 2022-10-28 NOTE — Telephone Encounter (Signed)
Pt can see Zella Ball, but cancelled his last Appointment on 11/6-  I will not have him go 6 months with not being seen- he needs to see Zella Ball ASAP and then will refill meds-   Thanks- ML

## 2022-10-29 ENCOUNTER — Encounter
Payer: No Typology Code available for payment source | Attending: Physical Medicine and Rehabilitation | Admitting: Registered Nurse

## 2022-10-29 ENCOUNTER — Encounter: Payer: Self-pay | Admitting: Registered Nurse

## 2022-10-29 VITALS — BP 131/87 | HR 76 | Ht 76.0 in | Wt 162.0 lb

## 2022-10-29 DIAGNOSIS — G894 Chronic pain syndrome: Secondary | ICD-10-CM | POA: Insufficient documentation

## 2022-10-29 DIAGNOSIS — Z5181 Encounter for therapeutic drug level monitoring: Secondary | ICD-10-CM | POA: Diagnosis not present

## 2022-10-29 DIAGNOSIS — Z79899 Other long term (current) drug therapy: Secondary | ICD-10-CM | POA: Diagnosis not present

## 2022-10-29 DIAGNOSIS — R252 Cramp and spasm: Secondary | ICD-10-CM | POA: Insufficient documentation

## 2022-10-29 DIAGNOSIS — G8250 Quadriplegia, unspecified: Secondary | ICD-10-CM | POA: Insufficient documentation

## 2022-10-29 MED ORDER — OXYCODONE HCL 10 MG PO TABS: 10.0000 mg | ORAL_TABLET | Freq: Four times a day (QID) | ORAL | 0 refills | Status: DC | PRN
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
## 2022-10-29 NOTE — Telephone Encounter (Signed)
Please schedule appointment with Zella Ball NP per Dr. Dagoberto Ligas.

## 2022-11-01 ENCOUNTER — Telehealth: Payer: Self-pay | Admitting: *Deleted

## 2022-11-01 ENCOUNTER — Telehealth: Payer: Self-pay | Admitting: Registered Nurse

## 2022-11-01 LAB — TOXASSURE SELECT,+ANTIDEPR,UR

## 2022-11-01 NOTE — Telephone Encounter (Signed)
Patient states that insurance requesting a PA.

## 2022-11-01 NOTE — Telephone Encounter (Signed)
Urine drug screen is positive for alcohol, oxycodone and hydrocodone. He is not prescribed hydrocodone. Per Dr Dagoberto Ligas he is to be discharged. This is a violation of his controlled substance agreement. He will be given a wean down schedule for the current Rx for oxycodone. Letter sent through [employer] and certified mail.

## 2022-11-01 NOTE — Telephone Encounter (Signed)
UDS was Reviewed.  Inconsistent. Discuss with Dr Dagoberto Ligas.  Carlos [name] will be discharge from our office.  [facility] was called to remove December prescription.  Carlos [name] was given a wean down schedule for his oxycodone.

## 2022-11-02 ENCOUNTER — Encounter: Payer: Self-pay | Admitting: Physical Medicine and Rehabilitation

## 2022-11-04 ENCOUNTER — Telehealth: Payer: Self-pay | Admitting: Pharmacy Technician

## 2022-11-04 ENCOUNTER — Other Ambulatory Visit (HOSPITAL_COMMUNITY): Payer: Self-pay

## 2022-11-04 NOTE — Telephone Encounter (Signed)
Pharmacy Patient Advocate Encounter   Received notification from Office/CMA that prior authorization for Dexcom G7 Sensors is required/requested.   PA submitted on 11/04/22 to [employer] via CoverMyMeds Key [id number] Status is pending

## 2022-11-06 ENCOUNTER — Other Ambulatory Visit (HOSPITAL_COMMUNITY): Payer: Self-pay

## 2022-11-07 ENCOUNTER — Other Ambulatory Visit: Payer: Self-pay | Admitting: Family Medicine

## 2022-11-08 ENCOUNTER — Other Ambulatory Visit (HOSPITAL_COMMUNITY): Payer: Self-pay

## 2022-11-08 NOTE — Telephone Encounter (Signed)
Requested medication (s) are due for refill today:   Yes  Requested medication (s) are on the active medication list:   Yes  Future visit scheduled:   No   Seen 1 mo. ago   Last ordered: 09/12/2022 #180, 0 refills  Returned because labs are due per protocol.   Requested Prescriptions  Pending Prescriptions Disp Refills   gemfibrozil (LOPID) 600 MG tablet [Pharmacy Med Name: Gemfibrozil 600 MG Oral Tablet] 180 tablet 3    Sig: TAKE 1 TABLET BY MOUTH TWICE  DAILY BEFORE A MEAL     Cardiovascular:  Antilipid - Fibric Acid Derivatives Failed - 11/07/2022  6:16 AM      Failed - HGB in normal range and within 360 days    Hemoglobin  Date Value Ref Range Status  10/01/2021 11.4 (L) 13.0 - 17.0 g/dL Final         Failed - HCT in normal range and within 360 days    HCT  Date Value Ref Range Status  10/01/2021 35.2 (L) 39.0 - 52.0 % Final         Failed - PLT in normal range and within 360 days    Platelets  Date Value Ref Range Status  10/01/2021 285 150 - 400 K/uL Final         Failed - WBC in normal range and within 360 days    WBC  Date Value Ref Range Status  10/01/2021 6.0 4.0 - 10.5 K/uL Final         Failed - Lipid Panel in normal range within the last 12 months    Cholesterol  Date Value Ref Range Status  10/09/2022 209 (H) 0 - 200 mg/dL Final    Comment:    ATP III Classification       Desirable:  < 200 mg/dL               Borderline High:  200 - 239 mg/dL          High:  > = 240 mg/dL   LDL Cholesterol (Calc)  Date Value Ref Range Status  07/12/2021 114 (H) mg/dL (calc) Final    Comment:    Reference range: <100 . Desirable range <100 mg/dL for primary prevention;   <70 mg/dL for patients with CHD or diabetic patients  with > or = 2 CHD risk factors. Marland Kitchen LDL-C is now calculated using the Martin-Hopkins  calculation, which is a validated novel method providing  better accuracy than the Friedewald equation in the  estimation of LDL-C.  Cresenciano Genre et al. Annamaria Helling.  1594;585(92): 2061-2068  (http://education.[employer].com/faq/FAQ164)    LDL Cholesterol  Date Value Ref Range Status  10/09/2022 150 (H) 0 - 99 mg/dL Final   Direct LDL  Date Value Ref Range Status  08/26/2017 130.0 mg/dL Final    Comment:    Optimal:  <100 mg/dLNear or Above Optimal:  100-129 mg/dLBorderline High:  130-159 mg/dLHigh:  160-189 mg/dLVery High:  >190 mg/dL   HDL  Date Value Ref Range Status  10/09/2022 44.90 >39.00 mg/dL Final   Triglycerides  Date Value Ref Range Status  10/09/2022 68.0 0.0 - 149.0 mg/dL Final    Comment:    Normal:  <150 mg/dLBorderline High:  150 - 199 mg/dL         Passed - ALT in normal range and within 360 days    ALT  Date Value Ref Range Status  10/09/2022 10 0 - 53 U/L Final  Passed - AST in normal range and within 360 days    AST  Date Value Ref Range Status  10/09/2022 14 0 - 37 U/L Final         Passed - Cr in normal range and within 360 days    Creat  Date Value Ref Range Status  07/12/2021 0.93 0.60 - 1.29 mg/dL Final   Creatinine, Ser  Date Value Ref Range Status  10/09/2022 0.70 0.40 - 1.50 mg/dL Final   Creatinine,U  Date Value Ref Range Status  06/03/2022 64.2 mg/dL Final         Passed - eGFR is 30 or above and within 360 days    GFR calc Af Amer  Date Value Ref Range Status  03/13/2020 >60 >60 mL/min Final   GFR, Estimated  Date Value Ref Range Status  10/01/2021 >60 >60 mL/min Final    Comment:    (NOTE) Calculated using the CKD-EPI Creatinine Equation (2021)    GFR  Date Value Ref Range Status  10/09/2022 107.62 >60.00 mL/min Final    Comment:    Calculated using the CKD-EPI Creatinine Equation (2021)         Passed - Valid encounter within last 12 months    Recent Outpatient Visits           1 month ago Type 2 diabetes mellitus with other specified complication, with long-term current use of insulin ([place])   Primary Care at [facility], MD   8 months ago  Uncontrolled type 2 diabetes mellitus with hypoglycemia without coma [facility])   Primary Care at [facility], MD   9 months ago Encounter to establish care   Primary Care at [facility], Kriste Basque, NP       Future Appointments             In 1 week Hollice Espy, [place]

## 2022-11-08 NOTE — Telephone Encounter (Signed)
Pharmacy Patient Advocate Encounter  Prior Authorization for [employer] has been approved.    PA# [id number] Effective dates: 11/04/2022 through 11/05/2023

## 2022-11-11 NOTE — Telephone Encounter (Signed)
Called to inform patient. He says that he has received [place] in mail today.

## 2022-11-12 ENCOUNTER — Encounter: Payer: Self-pay | Admitting: Family Medicine

## 2022-11-12 ENCOUNTER — Other Ambulatory Visit: Payer: Self-pay | Admitting: Family Medicine

## 2022-11-12 DIAGNOSIS — M545 Low back pain, unspecified: Secondary | ICD-10-CM | POA: Diagnosis not present

## 2022-11-12 DIAGNOSIS — R030 Elevated blood-pressure reading, without diagnosis of hypertension: Secondary | ICD-10-CM | POA: Diagnosis not present

## 2022-11-12 DIAGNOSIS — M25512 Pain in left shoulder: Secondary | ICD-10-CM | POA: Diagnosis not present

## 2022-11-12 DIAGNOSIS — M542 Cervicalgia: Secondary | ICD-10-CM | POA: Diagnosis not present

## 2022-11-12 DIAGNOSIS — M129 Arthropathy, unspecified: Secondary | ICD-10-CM | POA: Diagnosis not present

## 2022-11-12 DIAGNOSIS — G8929 Other chronic pain: Secondary | ICD-10-CM | POA: Diagnosis not present

## 2022-11-12 DIAGNOSIS — Z125 Encounter for screening for malignant neoplasm of prostate: Secondary | ICD-10-CM | POA: Diagnosis not present

## 2022-11-12 DIAGNOSIS — R5383 Other fatigue: Secondary | ICD-10-CM | POA: Diagnosis not present

## 2022-11-12 DIAGNOSIS — G894 Chronic pain syndrome: Secondary | ICD-10-CM

## 2022-11-12 DIAGNOSIS — M546 Pain in thoracic spine: Secondary | ICD-10-CM | POA: Diagnosis not present

## 2022-11-12 DIAGNOSIS — R0602 Shortness of breath: Secondary | ICD-10-CM | POA: Diagnosis not present

## 2022-11-12 DIAGNOSIS — Z681 Body mass index (BMI) 19 or less, adult: Secondary | ICD-10-CM | POA: Diagnosis not present

## 2022-11-12 DIAGNOSIS — Z79899 Other long term (current) drug therapy: Secondary | ICD-10-CM | POA: Diagnosis not present

## 2022-11-12 DIAGNOSIS — Z131 Encounter for screening for diabetes mellitus: Secondary | ICD-10-CM | POA: Diagnosis not present

## 2022-11-12 DIAGNOSIS — E7800 Pure hypercholesterolemia, unspecified: Secondary | ICD-10-CM | POA: Diagnosis not present

## 2022-11-12 DIAGNOSIS — E559 Vitamin D deficiency, unspecified: Secondary | ICD-10-CM | POA: Diagnosis not present

## 2022-11-12 NOTE — Telephone Encounter (Signed)
Please advise patient.  

## 2022-11-14 ENCOUNTER — Encounter: Payer: Self-pay | Admitting: Occupational Therapy

## 2022-11-14 ENCOUNTER — Encounter: Payer: Self-pay | Admitting: Urology

## 2022-11-14 DIAGNOSIS — M6281 Muscle weakness (generalized): Secondary | ICD-10-CM

## 2022-11-14 DIAGNOSIS — M25512 Pain in left shoulder: Secondary | ICD-10-CM

## 2022-11-14 DIAGNOSIS — Z79899 Other long term (current) drug therapy: Secondary | ICD-10-CM | POA: Diagnosis not present

## 2022-11-14 DIAGNOSIS — R278 Other lack of coordination: Secondary | ICD-10-CM

## 2022-11-14 DIAGNOSIS — R208 Other disturbances of skin sensation: Secondary | ICD-10-CM

## 2022-11-14 DIAGNOSIS — M25612 Stiffness of left shoulder, not elsewhere classified: Secondary | ICD-10-CM
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
## 2022-11-20 ENCOUNTER — Ambulatory Visit (INDEPENDENT_AMBULATORY_CARE_PROVIDER_SITE_OTHER): Payer: No Typology Code available for payment source | Admitting: Urology

## 2022-11-20 VITALS — BP 126/76 | HR 70 | Ht 76.0 in | Wt 161.4 lb

## 2022-11-20 DIAGNOSIS — R3911 Hesitancy of micturition: Secondary | ICD-10-CM

## 2022-11-20 DIAGNOSIS — N319 Neuromuscular dysfunction of bladder, unspecified: Secondary | ICD-10-CM
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
## 2022-11-22 DIAGNOSIS — M545 Low back pain, unspecified: Secondary | ICD-10-CM | POA: Diagnosis not present

## 2022-11-22 DIAGNOSIS — Z79899 Other long term (current) drug therapy: Secondary | ICD-10-CM | POA: Diagnosis not present

## 2022-11-22 DIAGNOSIS — M542 Cervicalgia: Secondary | ICD-10-CM | POA: Diagnosis not present

## 2022-11-22 DIAGNOSIS — Z6820 Body mass index (BMI) 20.0-20.9, adult: Secondary | ICD-10-CM | POA: Diagnosis not present

## 2022-11-22 DIAGNOSIS — E109 Type 1 diabetes mellitus without complications: Secondary | ICD-10-CM | POA: Diagnosis not present

## 2022-11-22 DIAGNOSIS — G8929 Other chronic pain: Secondary | ICD-10-CM | POA: Diagnosis not present

## 2022-11-22 DIAGNOSIS — M25512 Pain in left shoulder: Secondary | ICD-10-CM | POA: Diagnosis not present

## 2022-11-26 DIAGNOSIS — S42112A Displaced fracture of body of scapula, left shoulder, initial encounter for closed fracture: Secondary | ICD-10-CM | POA: Insufficient documentation

## 2022-11-26 DIAGNOSIS — S2242XA Multiple fractures of ribs, left side, initial encounter for closed fracture: Secondary | ICD-10-CM | POA: Insufficient documentation

## 2022-11-27 DIAGNOSIS — Z79899 Other long term (current) drug therapy: Secondary | ICD-10-CM | POA: Diagnosis not present

## 2022-12-02 ENCOUNTER — Ambulatory Visit: Payer: No Typology Code available for payment source | Admitting: Dietician

## 2022-12-13 ENCOUNTER — Other Ambulatory Visit: Payer: Self-pay | Admitting: Endocrinology

## 2022-12-13 DIAGNOSIS — E11649 Type 2 diabetes mellitus with hypoglycemia without coma: Secondary | ICD-10-CM

## 2022-12-20 ENCOUNTER — Telehealth: Payer: Self-pay

## 2022-12-20 NOTE — Telephone Encounter (Signed)
Patient called in states pharmacy sent over a PA for his Rybelsus over a week ago but hasn't heard anything. Patient only has 5 days left of medication. Can we submit PA?

## 2022-12-23 ENCOUNTER — Telehealth: Payer: Self-pay

## 2022-12-23 ENCOUNTER — Other Ambulatory Visit (HOSPITAL_COMMUNITY): Payer: Self-pay

## 2022-12-23 DIAGNOSIS — E1165 Type 2 diabetes mellitus with hyperglycemia: Secondary | ICD-10-CM

## 2022-12-23 DIAGNOSIS — M545 Low back pain, unspecified: Secondary | ICD-10-CM | POA: Diagnosis not present

## 2022-12-23 DIAGNOSIS — Z681 Body mass index (BMI) 19 or less, adult: Secondary | ICD-10-CM | POA: Diagnosis not present

## 2022-12-23 DIAGNOSIS — Z79899 Other long term (current) drug therapy: Secondary | ICD-10-CM | POA: Diagnosis not present

## 2022-12-23 DIAGNOSIS — E109 Type 1 diabetes mellitus without complications: Secondary | ICD-10-CM | POA: Diagnosis not present

## 2022-12-23 MED ORDER — SEMAGLUTIDE(0.25 OR 0.5MG/DOS) 2 MG/3ML ~~LOC~~ SOPN: PEN_INJECTOR | SUBCUTANEOUS | 0 refills | Status: DC

## 2022-12-23 NOTE — Telephone Encounter (Signed)
Freestyle libre for the last 2 weeks shows overall average of 190 with mean glucose averaging about 180 throughout the day at night

## 2022-12-23 NOTE — Telephone Encounter (Signed)
Report printed and placed on your desk

## 2022-12-23 NOTE — Telephone Encounter (Signed)
Called spoke with patient. He is aware he has been doing 10 units of Toujeo and will go up to 14 units. Ozempic sent in.

## 2022-12-23 NOTE — Telephone Encounter (Signed)
Pharmacy Patient Advocate Encounter   Received notification from [place] that prior authorization for Rybelsus is required/requested.  Looks like he changed ins since he last got the med. His previous ins must not have required a PA.    PA started on 12/23/22 for (ins) [employer] via CoverMyMeds Key [id number]  Formulary preferred GLP-1 receptor agonist are: Bydureon pen, Byetta pen, Trulicity pen, Victoza pen, or Ozempic injection.  I don't see that he's been on any of these. I was going to try the needle phobia angle, but don't see anything about that in notes. I also see that he was started on Toujeo. Do we know if he's using it? Or if there is a needle phobia issue? Did the patient have a previous episode of an unacceptable side effect or therapeutic failure with all the preferred drugs?   Below is also one of the clinical ?s on the PA form: Does the patient have a clinical contraindication, co-morbidity, or unique patient circumstance as a contraindication to all the preferred drugs?

## 2022-12-23 NOTE — Telephone Encounter (Signed)
Patient has been notified

## 2022-12-23 NOTE — Telephone Encounter (Signed)
Will work on

## 2022-12-23 NOTE — Telephone Encounter (Signed)
Patient states his blood sugars are extremely higher than before even with Toujeo. He is connected to [place]. Please advise.

## 2022-12-24 ENCOUNTER — Encounter: Payer: Self-pay | Admitting: Endocrinology

## 2022-12-25 ENCOUNTER — Telehealth: Payer: Self-pay | Admitting: Family Medicine

## 2022-12-25 NOTE — Telephone Encounter (Signed)
Requested medication (s) are due for refill today: yes   Requested medication (s) are on the active medication list: yes   Last refill:  09/24/22 #30 1 refills  Future visit scheduled: no   Notes to clinic:  medication not assigned to a protocol. Do you want to refill Rx?     Requested Prescriptions  Pending Prescriptions Disp Refills   terbinafine (LAMISIL) 250 MG tablet [Pharmacy Med Name: TERBINAFINE HCL 250 MG TABLET] 30 tablet 2    Sig: TAKE 1 TABLET BY MOUTH EVERY DAY     Off-Protocol Failed - 12/25/2022  1:32 PM      Failed - Medication not assigned to a protocol, review manually.      Passed - Valid encounter within last 12 months    Recent Outpatient Visits           3 months ago Type 2 diabetes mellitus with other specified complication, with long-term current use of insulin [facility])   Primary Care at [facility], Clyde Canterbury, MD   10 months ago Uncontrolled type 2 diabetes mellitus with hypoglycemia without coma [facility])   Primary Care at [facility], MD   11 months ago Encounter to establish care   Primary Care at [facility], Kriste Basque, NP

## 2022-12-26 ENCOUNTER — Telehealth: Payer: Self-pay | Admitting: Pharmacy Technician

## 2022-12-26 ENCOUNTER — Other Ambulatory Visit (HOSPITAL_COMMUNITY): Payer: Self-pay

## 2022-12-26 ENCOUNTER — Other Ambulatory Visit: Payer: Self-pay | Admitting: *Deleted

## 2022-12-26 MED ORDER — TERBINAFINE HCL 250 MG PO TABS: 250.0000 mg | ORAL_TABLET | Freq: Every day | ORAL | 2 refills | Status: DC

## 2022-12-26 NOTE — Telephone Encounter (Signed)
Medication resent

## 2022-12-26 NOTE — Telephone Encounter (Signed)
Pt called saying his refill request was denied and he said he is supposed to be taking this for a year and needs to know why it was denied.  CB@  [phone]

## 2022-12-26 NOTE — Telephone Encounter (Signed)
Pharmacy Patient Advocate Encounter   Received notification from CMA/Provider/Pt that prior authorization for Ozempic 0.25 or 0.'5mg'$ /dose is required/requested.  Per Test Claim: trial of Metformin.   PA submitted on 12/26/22 to (ins) [place] [facility] via CoverMyMeds Key  [id number]  Prior Authorization has been approved.    PA# PA Case: [id number] Effective dates: 12/26/22 through 12/26/23  Spoke with Pharmacy to process.

## 2023-01-06 DIAGNOSIS — S42112D Displaced fracture of body of scapula, left shoulder, subsequent encounter for fracture with routine healing: Secondary | ICD-10-CM | POA: Diagnosis not present

## 2023-01-06 DIAGNOSIS — S2242XK Multiple fractures of ribs, left side, subsequent encounter for fracture with nonunion: Secondary | ICD-10-CM | POA: Diagnosis not present

## 2023-01-06 DIAGNOSIS — S2242XD Multiple fractures of ribs, left side, subsequent encounter for fracture with routine healing: Secondary | ICD-10-CM | POA: Diagnosis not present

## 2023-01-09 ENCOUNTER — Other Ambulatory Visit: Payer: Self-pay | Admitting: Physical Medicine and Rehabilitation

## 2023-01-10 ENCOUNTER — Ambulatory Visit: Payer: No Typology Code available for payment source | Admitting: Dietician

## 2023-01-12 ENCOUNTER — Other Ambulatory Visit: Payer: Self-pay | Admitting: Family Medicine

## 2023-01-12 DIAGNOSIS — E11649 Type 2 diabetes mellitus with hypoglycemia without coma: Secondary | ICD-10-CM

## 2023-01-13 NOTE — Telephone Encounter (Signed)
Requested Prescriptions  Refused Prescriptions Disp Refills   metFORMIN (GLUCOPHAGE) 1000 MG tablet [Pharmacy Med Name: metFORMIN HCl 1000 MG Oral Tablet] 180 tablet 3    Sig: TAKE 1 TABLET BY MOUTH TWICE  DAILY WITH MEALS     Endocrinology:  Diabetes - Biguanides Failed - 01/12/2023 10:17 PM      Failed - HBA1C is between 0 and 7.9 and within 180 days    Hgb A1c MFr Bld  Date Value Ref Range Status  10/09/2022 8.1 (H) 4.6 - 6.5 % Final    Comment:    Glycemic Control Guidelines for People with Diabetes:Non Diabetic:  <6%Goal of Therapy: <7%Additional Action Suggested:  >8%          Failed - B12 Level in normal range and within 720 days    No results found for: "VITAMINB12"       Failed - CBC within normal limits and completed in the last 12 months    WBC  Date Value Ref Range Status  10/01/2021 6.0 4.0 - 10.5 K/uL Final   RBC  Date Value Ref Range Status  10/01/2021 3.69 (L) 4.22 - 5.81 MIL/uL Final   Hemoglobin  Date Value Ref Range Status  10/01/2021 11.4 (L) 13.0 - 17.0 g/dL Final   HCT  Date Value Ref Range Status  10/01/2021 35.2 (L) 39.0 - 52.0 % Final   MCHC  Date Value Ref Range Status  10/01/2021 32.4 30.0 - 36.0 g/dL Final   [facility]  Date Value Ref Range Status  10/01/2021 30.9 26.0 - 34.0 pg Final   MCV  Date Value Ref Range Status  10/01/2021 95.4 80.0 - 100.0 fL Final   No results found for: "PLTCOUNTKUC", "LABPLAT", "POCPLA" RDW  Date Value Ref Range Status  10/01/2021 11.9 11.5 - 15.5 % Final         Passed - Cr in normal range and within 360 days    Creat  Date Value Ref Range Status  07/12/2021 0.93 0.60 - 1.29 mg/dL Final   Creatinine, Ser  Date Value Ref Range Status  10/09/2022 0.70 0.40 - 1.50 mg/dL Final   Creatinine,U  Date Value Ref Range Status  06/03/2022 64.2 mg/dL Final         Passed - eGFR in normal range and within 360 days    GFR calc Af Amer  Date Value Ref Range Status  03/13/2020 >60 >60 mL/min Final   GFR,  Estimated  Date Value Ref Range Status  10/01/2021 >60 >60 mL/min Final    Comment:    (NOTE) Calculated using the CKD-EPI Creatinine Equation (2021)    GFR  Date Value Ref Range Status  10/09/2022 107.62 >60.00 mL/min Final    Comment:    Calculated using the CKD-EPI Creatinine Equation (2021)         Passed - Valid encounter within last 6 months    Recent Outpatient Visits           3 months ago Type 2 diabetes mellitus with other specified complication, with long-term current use of insulin ([place])   [place], Clyde Canterbury, MD   10 months ago Uncontrolled type 2 diabetes mellitus with hypoglycemia without coma [facility])    Primary Care at [facility], MD   12 months ago Encounter to establish care   [facility] Care at [facility], Kriste Basque, NP

## 2023-01-17 ENCOUNTER — Encounter: Payer: Medicaid Other | Admitting: Physical Medicine and Rehabilitation

## 2023-01-20 DIAGNOSIS — E109 Type 1 diabetes mellitus without complications: Secondary | ICD-10-CM | POA: Diagnosis not present

## 2023-01-20 DIAGNOSIS — Z79899 Other long term (current) drug therapy: Secondary | ICD-10-CM | POA: Diagnosis not present

## 2023-01-20 DIAGNOSIS — M545 Low back pain, unspecified: Secondary | ICD-10-CM | POA: Diagnosis not present

## 2023-01-20 DIAGNOSIS — Z681 Body mass index (BMI) 19 or less, adult: Secondary | ICD-10-CM | POA: Diagnosis not present

## 2023-01-28 ENCOUNTER — Other Ambulatory Visit: Payer: Self-pay | Admitting: Endocrinology

## 2023-01-28 ENCOUNTER — Other Ambulatory Visit: Payer: Self-pay | Admitting: Family Medicine

## 2023-01-28 DIAGNOSIS — S42112P Displaced fracture of body of scapula, left shoulder, subsequent encounter for fracture with malunion: Secondary | ICD-10-CM | POA: Diagnosis not present

## 2023-01-28 DIAGNOSIS — E11649 Type 2 diabetes mellitus with hypoglycemia without coma: Secondary | ICD-10-CM

## 2023-01-28 DIAGNOSIS — S42112D Displaced fracture of body of scapula, left shoulder, subsequent encounter for fracture with routine healing: Secondary | ICD-10-CM | POA: Diagnosis not present

## 2023-01-28 DIAGNOSIS — S2242XK Multiple fractures of ribs, left side, subsequent encounter for fracture with nonunion: Secondary | ICD-10-CM | POA: Diagnosis not present

## 2023-01-28 DIAGNOSIS — S2242XD Multiple fractures of ribs, left side, subsequent encounter for fracture with routine healing: Secondary | ICD-10-CM | POA: Diagnosis not present

## 2023-01-28 DIAGNOSIS — E1165 Type 2 diabetes mellitus with hyperglycemia: Secondary | ICD-10-CM

## 2023-01-29 ENCOUNTER — Ambulatory Visit: Payer: Medicaid Other | Admitting: Endocrinology

## 2023-01-29 VITALS — BP 102/76 | HR 71 | Ht 76.0 in | Wt 163.0 lb

## 2023-01-29 DIAGNOSIS — E1165 Type 2 diabetes mellitus with hyperglycemia: Secondary | ICD-10-CM

## 2023-01-29 LAB — POCT GLYCOSYLATED HEMOGLOBIN (HGB A1C): Hemoglobin A1C: 7 % — AB (ref 4.0–5.6)

## 2023-01-29 NOTE — Progress Notes (Signed)
Patient ID: Carlos [name]., male   DOB: 1971/05/24, 52 y.o.   MRN: [id number]            Reason for Appointment: Type II Diabetes follow-up   History of Present Illness   Diagnosis date: 2014  Previous history:  Oral hypoglycemic drugs previously used are: Amaryl, glipizide, metformin He has been taking Jardiance more regularly since about 12/2020 Never been on insulin  A1c range in the last few years is: 6.4-8.4  Recent history:     Non-insulin hypoglycemic drugs: Ozempic 0.25 mg weekly metformin 1 g twice daily      INSULIN doses: Toujeo 14 units daily       Side effects from medications: None  Current self management, blood sugar patterns and problems identified:  A1c is 7 compared to 8.1 Recent GMI 6.9 Because of persistently high blood sugars on oral agents including Rybelsus he was started on basal insulin About a month ago he still had readings averaging about 180 throughout the day and was given Ozempic also He has gone up to 14 units from [place] starting dose of 10 units of the insulin and is taking this without difficulty daily More recently blood sugars are averaging about 150 overall with 82% within target Still has sporadic postprandial hyperglycemia but on an average blood sugars are not rising over 180 after meals Morning sugars are somewhat variable He has gained back 2 pounds of weight No nausea with Ozempic Continues on metformin  Exercise: walking fairly regularly, also coaching basketball  Analysis of freestyle libre 3 download for the last 14 days as follows  Overall variability is mild with GV 28 HYPERGLYCEMIC episodes are seen mostly in the first week of the data and only sporadically in the last few days Overnight blood sugars are fairly steady averaging around 140 and stable Fasting readings appear to be better in the last week compared to the first week also Premeal blood sugars are also in the 140-150 range Overall blood sugars are rising  more after dinner than any other meals Also occasionally has blood sugar spikes late in the evening including last night after midnight No hypoglycemia      CGM use % of time   2-week average/GV   Time in range      82%  % Time Above 180 18  % Time above 250   % Time Below 70      PRE-MEAL Fasting Lunch Dinner Bedtime Overall  Glucose range:       Averages: 143       POST-MEAL PC Breakfast PC Lunch PC Dinner  Glucose range:     Averages: 161 156 172    Previous glucometer [place] .           Blood Glucose readings from previous download   PRE-MEAL Fasting Lunch Dinner Bedtime Overall  Glucose range:       Mean/median: 202    207   POST-MEAL PC Breakfast PC Lunch PC Dinner  Glucose range:   184-304  Mean/median:                 Dietician visit: Most recent: Unknown     Weight control:  Wt Readings from Last 3 Encounters:  01/29/23 163 lb (73.9 kg)  11/20/22 161 lb 6.4 oz (73.2 kg)  10/29/22 162 lb (73.5 kg)            Diabetes labs:  Lab Results  Component Value Date   HGBA1C 7.0 (  A) 01/29/2023   HGBA1C 8.1 (H) 10/09/2022   HGBA1C 7.5 (H) 06/03/2022   Lab Results  Component Value Date   MICROALBUR <0.7 06/03/2022   LDLCALC 150 (H) 10/09/2022   CREATININE 0.70 10/09/2022     Allergies as of 01/29/2023       Reactions   Ambien [zolpidem Tartrate]    Crashed car day after, also hallucinating        Medication List        Accurate as of January 29, 2023  4:27 PM. If you have any questions, ask your nurse or doctor.          acetaminophen 325 MG tablet Commonly known as: TYLENOL Take 1-2 tablets (325-650 mg total) by mouth every 4 (four) hours as needed for mild pain.   aspirin EC 81 MG tablet Take 1 tablet (81 mg total) by mouth daily. Swallow whole.   baclofen 20 MG tablet Commonly known as: LIORESAL Take 2 tablets (40 mg total) by mouth 3 (three) times daily. For spasticity   Dexcom G7 Sensor Misc Change every 10 days    docusate sodium 100 MG capsule Commonly known as: COLACE Take 100 mg by mouth 2 (two) times daily.   gemfibrozil 600 MG tablet Commonly known as: LOPID TAKE 1 TABLET BY MOUTH TWICE  DAILY BEFORE A MEAL   Insulin Pen Needle 31G X 5 MM Misc With pen   metFORMIN 1000 MG tablet Commonly known as: GLUCOPHAGE TAKE 1 TABLET BY MOUTH TWICE  DAILY WITH MEALS   mirtazapine 15 MG tablet Commonly known as: REMERON TAKE 1 TABLET BY MOUTH DAILY AT  BEDTIME   multivitamin with minerals Tabs tablet Take 1 tablet by mouth daily.   Oxycodone HCl 10 MG Tabs Take 1 tablet (10 mg total) by mouth 4 (four) times daily as needed. Do not refill until 11/25/2022   Semaglutide(0.25 or 0.5MG/DOS) 2 MG/3ML Sopn Inject 0.25 for 4 weeks   terbinafine 250 MG tablet Commonly known as: LamISIL Take 1 tablet (250 mg total) by mouth daily.   Toujeo SoloStar 300 UNIT/ML Solostar Pen Generic drug: insulin glargine (1 Unit Dial) Inject 20 Units into the skin daily.   traZODone 50 MG tablet Commonly known as: DESYREL TAKE 1-2 TABLETS (50-100 MG TOTAL) BY MOUTH AT BEDTIME. FOR SLEEP   Vitamin B Complex Tabs Take1 tablet by mouth daily.        Allergies:  Allergies  Allergen Reactions   Ambien [Zolpidem Tartrate]     Crashed car day after, also hallucinating    Past Medical History:  Diagnosis Date   ADHD (attention deficit hyperactivity disorder)    Alcohol abuse    Depression    Diabetes mellitus without complication ([place])    History of exercise stress test    03-18-2013--  normal   History of kidney stones    Hyperlipidemia    Kidney stones    Left ureteral stone    Type 2 diabetes mellitus [facility])     Past Surgical History:  Procedure Laterality Date   ANTERIOR CERVICAL DECOMP/DISCECTOMY FUSION N/A 08/30/2021   Procedure: CERVICAL FIVE-SIX ANTERIOR CERVICAL DECOMPRESSION/DISCECTOMY FUSION;  Surgeon: Vallarie Mare, MD;  Location: [place];  Service: Neurosurgery;  Laterality: N/A;    CARDIAC CATHETERIZATION  11-27-2006  dr Verlon Setting   non-obstructive CAD/  20% proximal and mid LAD/  perserved LVF,  ef 55-60%   CYSTOSCOPY W/ RETROGRADES Left 05/17/2015   Procedure: CYSTOSCOPY WITH RETROGRADE PYELOGRAM;  Surgeon: Festus Aloe, MD;  Location: [place];  Service: Urology;  Laterality: Left;   CYSTOSCOPY/URETEROSCOPY/HOLMIUM LASER/STENT PLACEMENT Left 05/17/2015   Procedure: LEFT URETEROSCOPY/HOLMIUM LASER/STENT PLACEMENT;  Surgeon: Festus Aloe, MD;  Location: [place];  Service: Urology;  Laterality: Left;   EXTRACORPOREAL SHOCK WAVE LITHOTRIPSY Left 05-08-2015   EXTRACTION RIGHT MANDIBULAR , PREMOLAR/  MAXILLARY MANDIBULAR FIXATION WITH SCREWS  08-03-2008   LUMBAR PERCUTANEOUS PEDICLE SCREW 4 LEVEL N/A 09/04/2021   Procedure: Thoracic Four-Thoracic Eight  Percutaneous Instrumented Fusion;  Surgeon: Vallarie Mare, MD;  Location: [place];  Service: Neurosurgery;  Laterality: N/A;   ORIF FOUR HOLD PLATE AND MAXILLOMANDIBULAR FIXATION W/ ARCH BARS  08-05-2008   REMOVAL ARCH BARS 09-15-2008   TRANSTHORACIC ECHOCARDIOGRAM  04-06-2008  dr Verlon Setting   normal LVF,  ef 55-60%,  trivial MR and TR    Family History  Problem Relation Age of Onset   Cancer Mother    Diabetes Mother        type 1   Heart disease Mother        pacemaker   Cancer - Colon Mother    Alcohol abuse Father    Alcohol abuse Maternal Uncle    Diabetes Maternal Grandmother    Alcohol abuse Maternal Grandfather    Diabetes Maternal Grandfather    Prostate cancer Neg Hx     Social History:  reports that he has never smoked. He has never used smokeless tobacco. He reports current alcohol use of about 10.0 standard drinks of alcohol per week. He reports that he does not use drugs.  Review of Systems:  Last diabetic eye exam date unknown  Last foot exam date: 11/22  Symptoms of neuropathy: None  Hypertension: Not present  BP Readings from Last 3 Encounters:   01/29/23 102/76  11/20/22 126/76  10/29/22 131/87    Lipids: These have been monitored by his PCP and does not take any statin Currently only on gemfibrozil 600 mg twice daily Previously had been given atorvastatin for likely a couple of years but it has not taken any statin recently LDL is last higher    Lab Results  Component Value Date   CHOL 209 (H) 10/09/2022   CHOL 212 (H) 07/12/2021   CHOL 179 09/15/2018   Lab Results  Component Value Date   HDL 44.90 10/09/2022   HDL 56 07/12/2021   HDL 47.80 09/15/2018   Lab Results  Component Value Date   LDLCALC 150 (H) 10/09/2022   LDLCALC 114 (H) 07/12/2021   LDLCALC 91 09/15/2018   Lab Results  Component Value Date   TRIG 68.0 10/09/2022   TRIG 315 (H) 07/12/2021   TRIG 199.0 (H) 09/15/2018   Lab Results  Component Value Date   CHOLHDL 5 10/09/2022   CHOLHDL 3.8 07/12/2021   CHOLHDL 4 09/15/2018   Lab Results  Component Value Date   LDLDIRECT 130.0 08/26/2017   LDLDIRECT 65.0 03/21/2015   LDLDIRECT 84.6 07/22/2014      Examination:   BP 102/76 (BP Location: Left Arm, Patient Position: Sitting, Cuff Size: Normal)   Pulse 71   Ht 6' 4"$  (1.93 m)   Wt 163 lb (73.9 kg)   SpO2 100%   BMI 19.84 kg/m   Body mass index is 19.84 kg/m.    ASSESSMENT/ PLAN:    Diabetes type 2:   Current regimen: Toujeo 14 units daily, Ozempic 0.25 mg weekly and metformin 1 g twice daily  A1c is relatively better at 7 compared to 8.1  He has done much better with both adding basal insulin as well as Ozempic previously had not responded to Rybelsus Again discussed that he has insulin deficiency but currently able to control postprandial readings fairly well without mealtime insulin The last week blood sugars are better and may be from cumulative effect of Ozempic No side effects or weight loss with Ozempic He can likely benefit from going up to 0.5 mg after his fourth dose and he will let us know if he has any difficulties  with this Again discussed the possibility that in the future he may need mealtime insulin Since recent fasting readings are at times in the low 100 range will not increase the Toujeo as yet Preauthorization for various prescription will be done as needed including libre 3  Hyperlipidemia: Will need fasting labs on the next visit and consider restarting his statin  Patient Instructions  Ozempic 0.15m weekly   AElayne Snare2/14/2024, 4:27 PM

## 2023-01-29 NOTE — Patient Instructions (Signed)
Ozempic 0.76m weekly

## 2023-01-30 ENCOUNTER — Encounter: Payer: Self-pay | Admitting: Endocrinology

## 2023-01-30 DIAGNOSIS — E1165 Type 2 diabetes mellitus with hyperglycemia: Secondary | ICD-10-CM

## 2023-01-30 MED ORDER — TOUJEO SOLOSTAR 300 UNIT/ML ~~LOC~~ SOPN: 20.0000 [IU] | PEN_INJECTOR | Freq: Every day | SUBCUTANEOUS | 3 refills | Status: DC

## 2023-01-30 MED ORDER — FREESTYLE LIBRE 3 SENSOR MISC: 3 refills | Status: DC

## 2023-01-31 ENCOUNTER — Encounter: Payer: Self-pay | Admitting: Endocrinology

## 2023-02-04 ENCOUNTER — Encounter: Payer: Self-pay | Admitting: Family Medicine

## 2023-02-04 ENCOUNTER — Telehealth: Payer: Self-pay | Admitting: Pharmacy Technician

## 2023-02-04 ENCOUNTER — Other Ambulatory Visit (HOSPITAL_COMMUNITY): Payer: Self-pay

## 2023-02-04 NOTE — Telephone Encounter (Signed)
Pharmacy Patient Advocate Encounter  Received notification from Staff msgs/RMA that prior authorization for [place] sensors is required/requested.  Per Test Claim: Lantus is preferred by the ins.  Please advise if suggested medication is appropriate.     PA submitted on 02/04/23 to (ins) [place] via CoverMyMeds [employer] [id number] Status is pending- There's a chance it will be denied, b/c of not being on a pump or multiple daily insulin injections.  I see that we got the Dexcom Approved back in November, and it was filled twice, but it also is noted that he received the [place] in the mail at the same time the Dexcom was approved.  Do you know how he got it, ie. Medical ins, manufacturer program, etc?

## 2023-02-04 NOTE — Telephone Encounter (Signed)
-----   Message from Lauralyn Primes, [place] sent at 02/03/2023  5:16 PM EST ----- Regarding: PA Can we get an expedited PA for Toujeo and [place] sensors. Thank you.

## 2023-02-04 NOTE — Telephone Encounter (Signed)
Please advise patient.  

## 2023-02-05 ENCOUNTER — Encounter: Payer: Self-pay | Admitting: *Deleted

## 2023-02-06 ENCOUNTER — Encounter: Payer: Self-pay | Admitting: Endocrinology

## 2023-02-06 NOTE — Telephone Encounter (Signed)
Patient has appt schedule

## 2023-02-09 ENCOUNTER — Encounter: Payer: Self-pay | Admitting: Endocrinology

## 2023-02-09 DIAGNOSIS — E1165 Type 2 diabetes mellitus with hyperglycemia: Secondary | ICD-10-CM

## 2023-02-10 MED ORDER — LANTUS SOLOSTAR 100 UNIT/ML ~~LOC~~ SOPN: PEN_INJECTOR | SUBCUTANEOUS | 3 refills | Status: DC

## 2023-02-13 ENCOUNTER — Ambulatory Visit (INDEPENDENT_AMBULATORY_CARE_PROVIDER_SITE_OTHER): Payer: Medicaid Other | Admitting: Family Medicine

## 2023-02-13 VITALS — BP 115/78

## 2023-02-13 DIAGNOSIS — R252 Cramp and spasm: Secondary | ICD-10-CM | POA: Diagnosis not present

## 2023-02-13 DIAGNOSIS — F418 Other specified anxiety disorders: Secondary | ICD-10-CM

## 2023-02-13 DIAGNOSIS — G894 Chronic pain syndrome: Secondary | ICD-10-CM | POA: Diagnosis not present

## 2023-02-13 MED ORDER — MIRTAZAPINE 15 MG PO TABS: ORAL_TABLET | ORAL | 1 refills | Status: DC

## 2023-02-13 NOTE — Telephone Encounter (Signed)
Mychart message sent to patient.

## 2023-02-17 ENCOUNTER — Encounter: Payer: Self-pay | Admitting: Family Medicine

## 2023-02-17 ENCOUNTER — Ambulatory Visit: Payer: Self-pay | Admitting: Dietician

## 2023-02-17 NOTE — Progress Notes (Signed)
Established Patient Office Visit  Subjective    Patient ID: Carlos [name]., male    DOB: 1971/03/06  Age: 52 y.o. MRN: [id number]  CC: No chief complaint on file.   HPI [name]. presents for complaint of chronic pain/spasticity.   Outpatient Encounter Medications as of 02/13/2023  Medication Sig   acetaminophen (TYLENOL) 325 MG tablet Take 1-2 tablets (325-650 mg total) by mouth every 4 (four) hours as needed for mild pain.   aspirin EC 81 MG EC tablet Take 1 tablet (81 mg total) by mouth daily. Swallow whole.   B Complex Vitamins (VITAMIN B COMPLEX) TABS Take1 tablet by mouth daily.   baclofen (LIORESAL) 20 MG tablet Take 2 tablets (40 mg total) by mouth 3 (three) times daily. For spasticity   Continuous Blood Gluc Sensor (DEXCOM G7 SENSOR) MISC Change every 10 days   Continuous Blood Gluc Sensor (FREESTYLE LIBRE 3 SENSOR) MISC Use as instructed to check blood sugars   docusate sodium (COLACE) 100 MG capsule Take 100 mg by mouth 2 (two) times daily.   gemfibrozil (LOPID) 600 MG tablet TAKE 1 TABLET BY MOUTH TWICE  DAILY BEFORE A MEAL   insulin glargine (LANTUS SOLOSTAR) 100 UNIT/ML Solostar Pen Inject 14 units daily   Insulin Pen Needle 31G X 5 MM MISC With pen   metFORMIN (GLUCOPHAGE) 1000 MG tablet TAKE 1 TABLET BY MOUTH TWICE  DAILY WITH MEALS   Multiple Vitamin (MULTIVITAMIN WITH MINERALS) TABS tablet Take 1 tablet by mouth daily.   Oxycodone HCl 10 MG TABS Take 1 tablet (10 mg total) by mouth 4 (four) times daily as needed. Do not refill until 11/25/2022   PARoxetine (PAXIL) 20 MG tablet Take by mouth.   Semaglutide,0.25 or 0.'5MG'$ /DOS, (OZEMPIC, 0.25 OR 0.5 MG/DOSE,) 2 MG/3ML SOPN INJECT 0.25 FOR 4 WEEKS   tamsulosin (FLOMAX) 0.4 MG CAPS capsule Take by mouth.   terbinafine (LAMISIL) 250 MG tablet Take 1 tablet (250 mg total) by mouth daily.   traZODone (DESYREL) 50 MG tablet TAKE 1-2 TABLETS (50-100 MG TOTAL) BY MOUTH AT BEDTIME. FOR SLEEP   [DISCONTINUED]  mirtazapine (REMERON) 15 MG tablet TAKE 1 TABLET BY MOUTH DAILY AT  BEDTIME   mirtazapine (REMERON) 15 MG tablet TAKE 1 TABLET BY MOUTH DAILY AT  BEDTIME   No facility-administered encounter medications on file as of 02/13/2023.    Past Medical History:  Diagnosis Date   ADHD (attention deficit hyperactivity disorder)    Alcohol abuse    Depression    Diabetes mellitus without complication ([place])    History of exercise stress test    03-18-2013--  normal   History of kidney stones    Hyperlipidemia    Kidney stones    Left ureteral stone    Type 2 diabetes mellitus [facility])     Past Surgical History:  Procedure Laterality Date   ANTERIOR CERVICAL DECOMP/DISCECTOMY FUSION N/A 08/30/2021   Procedure: CERVICAL FIVE-SIX ANTERIOR CERVICAL DECOMPRESSION/DISCECTOMY FUSION;  Surgeon: Vallarie Mare, MD;  Location: [place];  Service: Neurosurgery;  Laterality: N/A;   CARDIAC CATHETERIZATION  11-27-2006  dr Verlon Setting   non-obstructive CAD/  20% proximal and mid LAD/  perserved LVF,  ef 55-60%   CYSTOSCOPY W/ RETROGRADES Left 05/17/2015   Procedure: CYSTOSCOPY WITH RETROGRADE PYELOGRAM;  Surgeon: Festus Aloe, MD;  Location: [facility];  Service: Urology;  Laterality: Left;   CYSTOSCOPY/URETEROSCOPY/HOLMIUM LASER/STENT PLACEMENT Left 05/17/2015   Procedure: LEFT URETEROSCOPY/HOLMIUM LASER/STENT PLACEMENT;  Surgeon: Rodman Key  Junious Silk, MD;  Location[facility];  Service: Urology;  Laterality: Left;   EXTRACORPOREAL SHOCK WAVE LITHOTRIPSY Left 05-08-2015   EXTRACTION RIGHT MANDIBULAR , PREMOLAR/  MAXILLARY MANDIBULAR FIXATION WITH SCREWS  08-03-2008   LUMBAR PERCUTANEOUS PEDICLE SCREW 4 LEVEL N/A 09/04/2021   Procedure: Thoracic Four-Thoracic Eight  Percutaneous Instrumented Fusion;  Surgeon: Vallarie Mare, MD;  Location: [place];  Service: Neurosurgery;  Laterality: N/A;   ORIF FOUR HOLD PLATE AND MAXILLOMANDIBULAR FIXATION W/ ARCH BARS  08-05-2008   REMOVAL ARCH  BARS 09-15-2008   TRANSTHORACIC ECHOCARDIOGRAM  04-06-2008  dr Verlon Setting   normal LVF,  ef 55-60%,  trivial MR and TR    Family History  Problem Relation Age of Onset   Cancer Mother    Diabetes Mother        type 1   Heart disease Mother        pacemaker   Cancer - Colon Mother    Alcohol abuse Father    Alcohol abuse Maternal Uncle    Diabetes Maternal Grandmother    Alcohol abuse Maternal Grandfather    Diabetes Maternal Grandfather    Prostate cancer Neg Hx     Social History   Socioeconomic History   Marital status: Married    Spouse name: Not on file   Number of children: Not on file   Years of education: Not on file   Highest education level: Not on file  Occupational History   Not on file  Tobacco Use   Smoking status: Never   Smokeless tobacco: Never  Vaping Use   Vaping Use: Never used  Substance and Sexual Activity   Alcohol use: Yes    Alcohol/week: 10.0 standard drinks of alcohol    Types: 10 Cans of beer per week   Drug use: No   Sexual activity: Not on file  Other Topics Concern   Not on file  Social History Narrative   ** Merged History Encounter **       Social Determinants of Health   Financial Resource Strain: Not on file  Food Insecurity: Not on file  Transportation Needs: Not on file  Physical Activity: Not on file  Stress: Not on file  Social Connections: Not on file  Intimate Partner Violence: Not on file    Review of Systems  Musculoskeletal:  Positive for back pain.  Psychiatric/Behavioral:  Positive for depression. Negative for suicidal ideas. The patient is nervous/anxious and has insomnia.   All other systems reviewed and are negative.       Objective    BP 115/78   Physical Exam Vitals and nursing note reviewed.  Constitutional:      General: He is not in acute distress. Cardiovascular:     Rate and Rhythm: Normal rate and regular rhythm.  Pulmonary:     Effort: Pulmonary effort is normal.     Breath sounds:  Normal breath sounds.  Abdominal:     Palpations: Abdomen is soft.     Tenderness: There is no abdominal tenderness.  Musculoskeletal:        General: Tenderness present. No deformity.  Neurological:     General: No focal deficit present.     Mental Status: He is alert and oriented to person, place, and time.         Assessment & Plan:   1. Chronic pain disorder Meds as per consultant  2. Spasticity Referral to neurosurg for further eval/mgt - Ambulatory referral to Neurosurgery  3.  Depression with anxiety Remeron '15mg'$  prescribed.     No follow-ups on file.   Becky Sax, MD

## 2023-02-19 NOTE — Telephone Encounter (Signed)
Previous encounters state that FreeStyle Libre/Dexcom are not covered as patient does not meet the criteria. Toujeo is to be changed to Lantus due to being preferred by insurance.

## 2023-02-24 ENCOUNTER — Encounter: Payer: Self-pay | Admitting: Family Medicine

## 2023-02-24 ENCOUNTER — Other Ambulatory Visit: Payer: Self-pay | Admitting: *Deleted

## 2023-02-24 ENCOUNTER — Encounter: Payer: Self-pay | Admitting: Endocrinology

## 2023-02-24 DIAGNOSIS — M545 Low back pain, unspecified: Secondary | ICD-10-CM | POA: Diagnosis not present

## 2023-02-24 DIAGNOSIS — R030 Elevated blood-pressure reading, without diagnosis of hypertension: Secondary | ICD-10-CM | POA: Diagnosis not present

## 2023-02-24 DIAGNOSIS — E109 Type 1 diabetes mellitus without complications: Secondary | ICD-10-CM | POA: Diagnosis not present

## 2023-02-24 DIAGNOSIS — Z79899 Other long term (current) drug therapy: Secondary | ICD-10-CM | POA: Diagnosis not present

## 2023-02-24 DIAGNOSIS — Z681 Body mass index (BMI) 19 or less, adult: Secondary | ICD-10-CM | POA: Diagnosis not present

## 2023-02-25 ENCOUNTER — Other Ambulatory Visit: Payer: Self-pay | Admitting: Family Medicine

## 2023-02-25 DIAGNOSIS — R252 Cramp and spasm: Secondary | ICD-10-CM

## 2023-02-25 NOTE — Telephone Encounter (Signed)
Referral sent in [place]

## 2023-02-27 ENCOUNTER — Other Ambulatory Visit: Payer: Self-pay | Admitting: Family Medicine

## 2023-02-27 MED ORDER — TAMSULOSIN HCL 0.4 MG PO CAPS: 0.4000 mg | ORAL_CAPSULE | Freq: Every day | ORAL | 2 refills | Status: DC

## 2023-02-27 NOTE — Telephone Encounter (Signed)
Medication Refill - Medication: tamsulosin (FLOMAX) 0.4 MG CAPS capsule [id number]   Has the patient contacted their pharmacy? Yes.   (Agent: If no, request that the patient contact the pharmacy for the refill. If patient does not wish to contact the pharmacy document the reason why and proceed with request.) (Agent: If yes, when and what did the pharmacy advise?)  Preferred Pharmacy (with phone number or street name):  [id number] [place] Phone: [phone] Fax: [phone]    Has the patient been seen for an appointment in the last year OR does the patient have an upcoming appointment? Yes.    Agent: Please be advised that RX refills may take up to 3 business days. We ask that you follow-up with your pharmacy.

## 2023-02-27 NOTE — Telephone Encounter (Signed)
Requested Prescriptions  Pending Prescriptions Disp Refills   tamsulosin (FLOMAX) 0.4 MG CAPS capsule 90 capsule 2    Sig: Take 1 capsule (0.4 mg total) by mouth daily.     Urology: Alpha-Adrenergic Blocker Passed - 02/27/2023 12:12 PM      Passed - PSA in normal range and within 360 days    PSA  Date Value Ref Range Status  07/12/2021 0.28 < OR = 4.00 ng/mL Final    Comment:    The total PSA value from this assay system is  standardized against the WHO standard. The test  result will be approximately 20% lower when compared  to the equimolar-standardized total PSA (Beckman  Coulter). Comparison of serial PSA results should be  interpreted with this fact in mind. . This test was performed using the Siemens  chemiluminescent method. Values obtained from  different assay methods cannot be used interchangeably. PSA levels, regardless of value, should not be interpreted as absolute evidence of the presence or absence of disease.    Prostate Specific Ag, Serum  Date Value Ref Range Status  10/09/2022 0.2 0.0 - 4.0 ng/mL Final    Comment:    Roche ECLIA methodology. According to the American Urological Association, Serum PSA should decrease and remain at undetectable levels after radical prostatectomy. The AUA defines biochemical recurrence as an initial PSA value 0.2 ng/mL or greater followed by a subsequent confirmatory PSA value 0.2 ng/mL or greater. Values obtained with different assay methods or kits cannot be used interchangeably. Results cannot be interpreted as absolute evidence of the presence or absence of malignant disease.          Passed - Last BP in normal range    BP Readings from Last 1 Encounters:  02/13/23 115/78         Passed - Valid encounter within last 12 months    Recent Outpatient Visits           2 weeks ago Chronic pain disorder   [place] [place] [facility], Clyde Canterbury, MD   5 months ago Type 2 diabetes mellitus with other  specified complication, with long-term current use of insulin [facility])   [place] [facility], Clyde Canterbury, MD   12 months ago Uncontrolled type 2 diabetes mellitus with hypoglycemia without coma [facility])   [place] [facility], MD   1 year ago Encounter to establish care   [facility], Kriste Basque, NP

## 2023-03-03 ENCOUNTER — Ambulatory Visit: Payer: Medicaid Other | Admitting: Family Medicine

## 2023-03-06 ENCOUNTER — Other Ambulatory Visit: Payer: Self-pay | Admitting: Physical Medicine and Rehabilitation

## 2023-03-07 ENCOUNTER — Encounter: Payer: Self-pay | Admitting: Endocrinology

## 2023-03-07 ENCOUNTER — Encounter: Payer: Self-pay | Admitting: Family Medicine

## 2023-03-07 DIAGNOSIS — E11649 Type 2 diabetes mellitus with hypoglycemia without coma: Secondary | ICD-10-CM

## 2023-03-10 MED ORDER — METFORMIN HCL 1000 MG PO TABS: 1000.0000 mg | ORAL_TABLET | Freq: Two times a day (BID) | ORAL | 1 refills | Status: DC

## 2023-03-10 NOTE — Telephone Encounter (Signed)
Please refill if appropriate

## 2023-03-11 MED ORDER — BACLOFEN 20 MG PO TABS: 40.0000 mg | ORAL_TABLET | Freq: Three times a day (TID) | ORAL | 1 refills | Status: AC

## 2023-03-20 ENCOUNTER — Telehealth: Payer: Self-pay

## 2023-03-20 NOTE — Telephone Encounter (Signed)
Resubmit authorization for [place]. Patient is know taking [place] 2 times a day

## 2023-03-23 ENCOUNTER — Other Ambulatory Visit: Payer: Self-pay | Admitting: Family Medicine

## 2023-03-24 ENCOUNTER — Telehealth: Payer: Self-pay | Admitting: Pharmacy Technician

## 2023-03-24 ENCOUNTER — Other Ambulatory Visit (HOSPITAL_COMMUNITY): Payer: Self-pay

## 2023-03-24 NOTE — Telephone Encounter (Signed)
Pharmacy Patient Advocate Encounter   Received notification from pt advice req msgs/cma/md that prior authorization for [facility] 3 is required/requested. Resubmitting since pt is now on insulin twice a day and he has other nerve damage issues preventing him from pricking his fingers.    PA submitted on 03/24/23 to (ins) [employer] [facility] via [employer] or ([place]) confirmation # [id number] - PA Case ID: [id number] Prior Authorization has been approved.  PA #  PA Case ID: [id number] Effective dates: 03/24/23 through 09/20/23  Spoke with Pharmacy to process. Copay is $0

## 2023-03-25 DIAGNOSIS — M545 Low back pain, unspecified: Secondary | ICD-10-CM | POA: Diagnosis not present

## 2023-03-25 DIAGNOSIS — R030 Elevated blood-pressure reading, without diagnosis of hypertension: Secondary | ICD-10-CM | POA: Diagnosis not present

## 2023-03-25 DIAGNOSIS — Z681 Body mass index (BMI) 19 or less, adult: Secondary | ICD-10-CM | POA: Diagnosis not present

## 2023-03-25 DIAGNOSIS — Z79899 Other long term (current) drug therapy: Secondary | ICD-10-CM | POA: Diagnosis not present

## 2023-03-25 DIAGNOSIS — E109 Type 1 diabetes mellitus without complications: Secondary | ICD-10-CM | POA: Diagnosis not present

## 2023-03-26 MED ORDER — TERBINAFINE HCL 250 MG PO TABS: 250.0000 mg | ORAL_TABLET | Freq: Every day | ORAL | 2 refills | Status: DC

## 2023-03-27 DIAGNOSIS — Z79899 Other long term (current) drug therapy: Secondary | ICD-10-CM | POA: Diagnosis not present

## 2023-03-28 ENCOUNTER — Encounter: Payer: Self-pay | Admitting: Family Medicine

## 2023-03-28 ENCOUNTER — Ambulatory Visit (INDEPENDENT_AMBULATORY_CARE_PROVIDER_SITE_OTHER): Payer: Medicaid Other | Admitting: Family Medicine

## 2023-03-28 VITALS — BP 119/80 | HR 74 | Temp 98.1°F | Resp 16 | Wt 161.8 lb

## 2023-03-28 DIAGNOSIS — R37 Sexual dysfunction, unspecified: Secondary | ICD-10-CM | POA: Diagnosis not present

## 2023-03-28 DIAGNOSIS — R601 Generalized edema: Secondary | ICD-10-CM

## 2023-03-28 MED ORDER — SILDENAFIL CITRATE 100 MG PO TABS: 50.0000 mg | ORAL_TABLET | Freq: Every day | ORAL | 1 refills | Status: DC | PRN

## 2023-03-28 NOTE — Progress Notes (Signed)
Patient is here for erectile dysfunction . Patient is here for blood pressure check for medication.   SDFD

## 2023-04-02 ENCOUNTER — Encounter: Payer: Self-pay | Admitting: Family Medicine

## 2023-04-02 NOTE — Progress Notes (Signed)
Established Patient Office Visit  Subjective    Patient ID: Carlos Kroon., male    DOB: 1971-03-13  Age: 52 y.o. MRN: [id number]  CC:  Chief Complaint  Patient presents with   Follow-up    HPI Carlos Heslin. presents with complaint of ED.    Outpatient Encounter Medications as of 03/28/2023  Medication Sig   acetaminophen (TYLENOL) 325 MG tablet Take 1-2 tablets (325-650 mg total) by mouth every 4 (four) hours as needed for mild pain.   aspirin EC 81 MG EC tablet Take 1 tablet (81 mg total) by mouth daily. Swallow whole.   B Complex Vitamins (VITAMIN B COMPLEX) TABS Take1 tablet by mouth daily.   baclofen (LIORESAL) 20 MG tablet Take 2 tablets (40 mg total) by mouth 3 (three) times daily. For spasticity   Continuous Blood Gluc Sensor (DEXCOM G7 SENSOR) MISC Change every 10 days   Continuous Blood Gluc Sensor (FREESTYLE LIBRE 3 SENSOR) MISC Use as instructed to check blood sugars   docusate sodium (COLACE) 100 MG capsule Take 100 mg by mouth 2 (two) times daily.   gemfibrozil (LOPID) 600 MG tablet TAKE 1 TABLET BY MOUTH TWICE  DAILY BEFORE A MEAL   insulin glargine (LANTUS SOLOSTAR) 100 UNIT/ML Solostar Pen Inject 14 units daily   Insulin Pen Needle 31G X 5 MM MISC With pen   metFORMIN (GLUCOPHAGE) 1000 MG tablet Take 1 tablet (1,000 mg total) by mouth 2 (two) times daily with a meal.   mirtazapine (REMERON) 15 MG tablet TAKE 1 TABLET BY MOUTH DAILY AT  BEDTIME   Multiple Vitamin (MULTIVITAMIN WITH MINERALS) TABS tablet Take 1 tablet by mouth daily.   Oxycodone HCl 10 MG TABS Take 1 tablet (10 mg total) by mouth 4 (four) times daily as needed. Do not refill until 11/25/2022   PARoxetine (PAXIL) 20 MG tablet Take by mouth.   Semaglutide,0.25 or 0.5MG /DOS, (OZEMPIC, 0.25 OR 0.5 MG/DOSE,) 2 MG/3ML SOPN INJECT 0.25 FOR 4 WEEKS   sildenafil (VIAGRA) 100 MG tablet Take 0.5-1 tablets (50-100 mg total) by mouth daily as needed for erectile dysfunction.   tamsulosin (FLOMAX) 0.4 MG  CAPS capsule Take 1 capsule (0.4 mg total) by mouth daily.   terbinafine (LAMISIL) 250 MG tablet Take 1 tablet (250 mg total) by mouth daily.   traZODone (DESYREL) 50 MG tablet TAKE 1-2 TABLETS (50-100 MG TOTAL) BY MOUTH AT BEDTIME. FOR SLEEP   No facility-administered encounter medications on file as of 03/28/2023.    Past Medical History:  Diagnosis Date   ADHD (attention deficit hyperactivity disorder)    Alcohol abuse    Depression    Diabetes mellitus without complication    History of exercise stress test    03-18-2013--  normal   History of kidney stones    Hyperlipidemia    Kidney stones    Left ureteral stone    Type 2 diabetes mellitus     Past Surgical History:  Procedure Laterality Date   ANTERIOR CERVICAL DECOMP/DISCECTOMY FUSION N/A 08/30/2021   Procedure: CERVICAL FIVE-SIX ANTERIOR CERVICAL DECOMPRESSION/DISCECTOMY FUSION;  Surgeon: Bedelia Person, MD;  Location: [facility] OR;  Service: Neurosurgery;  Laterality: N/A;   CARDIAC CATHETERIZATION  11-27-2006  dr Reyes Ivan   non-obstructive CAD/  20% proximal and mid LAD/  perserved LVF,  ef 55-60%   CYSTOSCOPY W/ RETROGRADES Left 05/17/2015   Procedure: CYSTOSCOPY WITH RETROGRADE PYELOGRAM;  Surgeon: Jerilee Field, MD;  Location: [facility];  Service: Urology;  Laterality: Left;   CYSTOSCOPY/URETEROSCOPY/HOLMIUM LASER/STENT PLACEMENT Left 05/17/2015   Procedure: LEFT URETEROSCOPY/HOLMIUM LASER/STENT PLACEMENT;  Surgeon: Jerilee Field, MD;  Location: [facility];  Service: Urology;  Laterality: Left;   EXTRACORPOREAL SHOCK WAVE LITHOTRIPSY Left 05-08-2015   EXTRACTION RIGHT MANDIBULAR , PREMOLAR/  MAXILLARY MANDIBULAR FIXATION WITH SCREWS  08-03-2008   LUMBAR PERCUTANEOUS PEDICLE SCREW 4 LEVEL N/A 09/04/2021   Procedure: Thoracic Four-Thoracic Eight  Percutaneous Instrumented Fusion;  Surgeon: Bedelia Person, MD;  Location: [facility] OR;  Service: Neurosurgery;  Laterality: N/A;   ORIF FOUR HOLD  PLATE AND MAXILLOMANDIBULAR FIXATION W/ ARCH BARS  08-05-2008   REMOVAL ARCH BARS 09-15-2008   TRANSTHORACIC ECHOCARDIOGRAM  04-06-2008  dr Reyes Ivan   normal LVF,  ef 55-60%,  trivial MR and TR    Family History  Problem Relation Age of Onset   Cancer Mother    Diabetes Mother        type 1   Heart disease Mother        pacemaker   Cancer - Colon Mother    Alcohol abuse Father    Alcohol abuse Maternal Uncle    Diabetes Maternal Grandmother    Alcohol abuse Maternal Grandfather    Diabetes Maternal Grandfather    Prostate cancer Neg Hx     Social History   Socioeconomic History   Marital status: Married    Spouse name: Not on file   Number of children: Not on file   Years of education: Not on file   Highest education level: Not on file  Occupational History   Not on file  Tobacco Use   Smoking status: Never   Smokeless tobacco: Never  Vaping Use   Vaping Use: Never used  Substance and Sexual Activity   Alcohol use: Yes    Alcohol/week: 10.0 standard drinks of alcohol    Types: 10 Cans of beer per week   Drug use: No   Sexual activity: Not on file  Other Topics Concern   Not on file  Social History Narrative   ** Merged History Encounter **       Social Determinants of Health   Financial Resource Strain: Not on file  Food Insecurity: Not on file  Transportation Needs: Not on file  Physical Activity: Not on file  Stress: Not on file  Social Connections: Not on file  Intimate Partner Violence: Not on file    Review of Systems  All other systems reviewed and are negative.       Objective    BP 119/80   Pulse 74   Temp 98.1 F (36.7 C) (Oral)   Resp 16   Wt 161 lb 12.8 oz (73.4 kg)   BMI 19.69 kg/m   Physical Exam Vitals and nursing note reviewed.  Constitutional:      General: He is not in acute distress. Cardiovascular:     Rate and Rhythm: Normal rate and regular rhythm.  Pulmonary:     Effort: Pulmonary effort is normal.     Breath  sounds: Normal breath sounds.  Genitourinary:    Comments: deferred Neurological:     General: No focal deficit present.     Mental Status: He is alert and oriented to person, place, and time.         Assessment & Plan:   1. Sexual dysfunction Questions answered. Viagra  prescribed.     Return in about 3 months (around 06/27/2023) for follow up.   Tommie Raymond, MD

## 2023-04-05 ENCOUNTER — Other Ambulatory Visit: Payer: Self-pay | Admitting: Physical Medicine and Rehabilitation

## 2023-04-07 NOTE — Progress Notes (Unsigned)
04/08/2023 4:18 PM   Carlos Sprinkles Jr. 10-21-1971 [id number]  Referring provider: Georganna Skeans, MD [address] [place],  [place] [address]  Urological history: 1. BPH with LU TS -PSA (09/2022) 0.2 -UDS (2023) - Urodynamics showed no bladder instability although no documented detrusor function on the study  -Tamsulosin 0.4 mg daily  2.  Nephrolithiasis -left URS (2016)   3. ED -Contributing factors of age, diabetes, alcohol consumption, depression and hyperlipidemia -Sildenafil 100 mg, on-demand dosing   No chief complaint on file.   HPI: Carlos [name]. is a 52 y.o. male who presents today for low testosterone.    Previous records reviewed.   I PSS ***    Score:  1-7 Mild 8-19 Moderate 20-35 Severe    SHIM ***    Score: 1-7 Severe ED 8-11 Moderate ED 12-16 Mild-Moderate ED 17-21 Mild ED 22-25 No ED     PMH: Past Medical History:  Diagnosis Date   ADHD (attention deficit hyperactivity disorder)    Alcohol abuse    Depression    Diabetes mellitus without complication    History of exercise stress test    03-18-2013--  normal   History of kidney stones    Hyperlipidemia    Kidney stones    Left ureteral stone    Type 2 diabetes mellitus     Surgical History: Past Surgical History:  Procedure Laterality Date   ANTERIOR CERVICAL DECOMP/DISCECTOMY FUSION N/A 08/30/2021   Procedure: CERVICAL FIVE-SIX ANTERIOR CERVICAL DECOMPRESSION/DISCECTOMY FUSION;  Surgeon: Carlos Person, MD;  Location: [facility] OR;  Service: Neurosurgery;  Laterality: N/A;   CARDIAC CATHETERIZATION  11-27-2006  dr Carlos [name]   non-obstructive CAD/  20% proximal and mid LAD/  perserved LVF,  ef 55-60%   CYSTOSCOPY W/ RETROGRADES Left 05/17/2015   Procedure: CYSTOSCOPY WITH RETROGRADE PYELOGRAM;  Surgeon: Carlos Field, MD;  Location: [facility];  Service: Urology;  Laterality: Left;   CYSTOSCOPY/URETEROSCOPY/HOLMIUM LASER/STENT PLACEMENT Left  05/17/2015   Procedure: LEFT URETEROSCOPY/HOLMIUM LASER/STENT PLACEMENT;  Surgeon: Carlos Field, MD;  Location: [facility];  Service: Urology;  Laterality: Left;   EXTRACORPOREAL SHOCK WAVE LITHOTRIPSY Left 05-08-2015   EXTRACTION RIGHT MANDIBULAR , PREMOLAR/  MAXILLARY MANDIBULAR FIXATION WITH SCREWS  08-03-2008   LUMBAR PERCUTANEOUS PEDICLE SCREW 4 LEVEL N/A 09/04/2021   Procedure: Thoracic Four-Thoracic Eight  Percutaneous Instrumented Fusion;  Surgeon: Carlos Person, MD;  Location: [facility] OR;  Service: Neurosurgery;  Laterality: N/A;   ORIF FOUR HOLD PLATE AND MAXILLOMANDIBULAR FIXATION W/ ARCH BARS  08-05-2008   REMOVAL ARCH BARS 09-15-2008   TRANSTHORACIC ECHOCARDIOGRAM  04-06-2008  dr Carlos [name]   normal LVF,  ef 55-60%,  trivial MR and TR    Home Medications:  Allergies as of 04/08/2023       Reactions   Ambien [zolpidem Tartrate]    Crashed car day after, also hallucinating        Medication List        Accurate as of April 07, 2023  4:18 PM. If you have any questions, ask your nurse or doctor.          acetaminophen 325 MG tablet Commonly known as: TYLENOL Take 1-2 tablets (325-650 mg total) by mouth every 4 (four) hours as needed for mild pain.   aspirin EC 81 MG tablet Take 1 tablet (81 mg total) by mouth daily. Swallow whole.   baclofen 20 MG tablet Commonly known as: LIORESAL Take 2 tablets (40 mg  total) by mouth 3 (three) times daily. For spasticity   Dexcom G7 Sensor Misc Change every 10 days   FreeStyle Libre 3 Sensor Misc Use as instructed to check blood sugars   docusate sodium 100 MG capsule Commonly known as: COLACE Take 100 mg by mouth 2 (two) times daily.   gemfibrozil 600 MG tablet Commonly known as: LOPID TAKE 1 TABLET BY MOUTH TWICE  DAILY BEFORE A MEAL   Insulin Pen Needle 31G X 5 MM Misc With pen   Lantus SoloStar 100 UNIT/ML Solostar Pen Generic drug: insulin glargine Inject 14 units daily   metFORMIN 1000 MG  tablet Commonly known as: GLUCOPHAGE Take 1 tablet (1,000 mg total) by mouth 2 (two) times daily with a meal.   mirtazapine 15 MG tablet Commonly known as: REMERON TAKE 1 TABLET BY MOUTH DAILY AT  BEDTIME   multivitamin with minerals Tabs tablet Take 1 tablet by mouth daily.   Oxycodone HCl 10 MG Tabs Take 1 tablet (10 mg total) by mouth 4 (four) times daily as needed. Do not refill until 11/25/2022   Ozempic (0.25 or 0.5 MG/DOSE) 2 MG/3ML Sopn Generic drug: Semaglutide(0.25 or 0.5MG /DOS) INJECT 0.25 FOR 4 WEEKS   PARoxetine 20 MG tablet Commonly known as: PAXIL Take by mouth.   sildenafil 100 MG tablet Commonly known as: Viagra Take 0.5-1 tablets (50-100 mg total) by mouth daily as needed for erectile dysfunction.   tamsulosin 0.4 MG Caps capsule Commonly known as: FLOMAX Take 1 capsule (0.4 mg total) by mouth daily.   terbinafine 250 MG tablet Commonly known as: LamISIL Take 1 tablet (250 mg total) by mouth daily.   traZODone 50 MG tablet Commonly known as: DESYREL TAKE 1-2 TABLETS (50-100 MG TOTAL) BY MOUTH AT BEDTIME. FOR SLEEP   Vitamin B Complex Tabs Take1 tablet by mouth daily.        Allergies:  Allergies  Allergen Reactions   Ambien [Zolpidem Tartrate]     Crashed car day after, also hallucinating    Family History: Family History  Problem Relation Age of Onset   Cancer Mother    Diabetes Mother        type 1   Heart disease Mother        pacemaker   Cancer - Colon Mother    Alcohol abuse Father    Alcohol abuse Maternal Uncle    Diabetes Maternal Grandmother    Alcohol abuse Maternal Grandfather    Diabetes Maternal Grandfather    Prostate cancer Neg Hx     Social History:  reports that he has never smoked. He has never used smokeless tobacco. He reports current alcohol use of about 10.0 standard drinks of alcohol per week. He reports that he does not use drugs.  ROS: Pertinent ROS in HPI  Physical Exam: There were no vitals taken  for this visit.  Constitutional:  Well nourished. Alert and oriented, No acute distress. HEENT: [place] AT, moist mucus membranes.  Trachea midline, no masses. Cardiovascular: No clubbing, cyanosis, or edema. Respiratory: Normal respiratory effort, no increased work of breathing. GI: Abdomen is soft, non tender, non distended, no abdominal masses. Liver and spleen not palpable.  No hernias appreciated.  Stool sample for occult testing is not indicated.   GU: No CVA tenderness.  No bladder fullness or masses.  Patient with circumcised/uncircumcised phallus. ***Foreskin easily retracted***  Urethral meatus is patent.  No penile discharge. No penile lesions or rashes. Scrotum without lesions, cysts, rashes and/or edema.  Testicles  are located scrotally bilaterally. No masses are appreciated in the testicles. Left and right epididymis are normal. Rectal: Patient with  normal sphincter tone. Anus and perineum without scarring or rashes. No rectal masses are appreciated. Prostate is approximately *** grams, *** nodules are appreciated. Seminal vesicles are normal. Skin: No rashes, bruises or suspicious lesions. Lymph: No cervical or inguinal adenopathy. Neurologic: Grossly intact, no focal deficits, moving all 4 extremities. Psychiatric: Normal mood and affect.  Laboratory Data: Lab Results  Component Value Date   CREATININE 0.70 10/09/2022   Lab Results  Component Value Date   TESTOSTERONE 340.88 06/03/2022   Lab Results  Component Value Date   HGBA1C 7.0 (A) 01/29/2023      Component Value Date/Time   CHOL 209 (H) 10/09/2022 1127   HDL 44.90 10/09/2022 1127   CHOLHDL 5 10/09/2022 1127   VLDL 13.6 10/09/2022 1127   LDLCALC 150 (H) 10/09/2022 1127   LDLCALC 114 (H) 07/12/2021 0930   Lab Results  Component Value Date   AST 14 10/09/2022   Lab Results  Component Value Date   ALT 10 10/09/2022  I have reviewed the labs.   Pertinent Imaging: N/A  Assessment & Plan:  ***  1. Low  testosterone ***  No follow-ups on file.  These notes generated with voice recognition software. I apologize for typographical errors.  Cloretta Ned  [facility] [address]  [address] [place], [place] [address] [phone]

## 2023-04-08 ENCOUNTER — Ambulatory Visit: Payer: Medicaid Other | Admitting: Urology

## 2023-04-08 ENCOUNTER — Encounter: Payer: Self-pay | Admitting: Urology

## 2023-04-08 VITALS — BP 125/82 | HR 68 | Ht 76.0 in | Wt 160.0 lb

## 2023-04-08 DIAGNOSIS — R7989 Other specified abnormal findings of blood chemistry: Secondary | ICD-10-CM

## 2023-04-08 DIAGNOSIS — R6882 Decreased libido: Secondary | ICD-10-CM

## 2023-04-08 DIAGNOSIS — E291 Testicular hypofunction: Secondary | ICD-10-CM | POA: Diagnosis not present

## 2023-04-08 LAB — BLADDER SCAN AMB NON-IMAGING: Scan Result: 109

## 2023-04-09 ENCOUNTER — Telehealth: Payer: Self-pay | Admitting: *Deleted

## 2023-04-09 LAB — TESTOSTERONE: Testosterone: 173 ng/dL — ABNORMAL LOW (ref 264–916)

## 2023-04-09 NOTE — Telephone Encounter (Signed)
-----   Message from Harle Battiest, PA-C sent at 04/09/2023  8:12 AM EDT ----- Please let Carlos [name] know that his testosterone level returned below 300 and that we will need to repeat a morning testosterone before 10 AM.

## 2023-04-09 NOTE — Telephone Encounter (Signed)
Notified patient as instructed, patient pleased. Discussed follow-up appointments, patient agrees  

## 2023-04-14 ENCOUNTER — Other Ambulatory Visit: Payer: Self-pay | Admitting: Endocrinology

## 2023-04-14 ENCOUNTER — Encounter: Payer: Self-pay | Admitting: Endocrinology

## 2023-04-14 ENCOUNTER — Other Ambulatory Visit: Payer: Self-pay

## 2023-04-14 DIAGNOSIS — E291 Testicular hypofunction: Secondary | ICD-10-CM

## 2023-04-14 MED ORDER — ATORVASTATIN CALCIUM 10 MG PO TABS: 10.0000 mg | ORAL_TABLET | Freq: Every day | ORAL | 3 refills | Status: DC

## 2023-04-15 ENCOUNTER — Other Ambulatory Visit: Payer: Medicaid Other

## 2023-04-15 DIAGNOSIS — E291 Testicular hypofunction: Secondary | ICD-10-CM | POA: Diagnosis not present

## 2023-04-16 LAB — TESTOSTERONE: Testosterone: 271 ng/dL (ref 264–916)

## 2023-04-17 ENCOUNTER — Telehealth: Payer: Self-pay | Admitting: Family Medicine

## 2023-04-17 NOTE — Telephone Encounter (Signed)
-----   Message from Carman Ching, [place] sent at 04/16/2023  4:32 PM EDT ----- Repeat AM testosterone is low, which confirms a diagnosis of hypogonadism. Please schedule him for an office visit with me or Carollee Herter to discuss treatment options.

## 2023-04-17 NOTE — Telephone Encounter (Signed)
Patient notified and appointment made

## 2023-04-21 ENCOUNTER — Other Ambulatory Visit (INDEPENDENT_AMBULATORY_CARE_PROVIDER_SITE_OTHER): Payer: Medicaid Other

## 2023-04-21 DIAGNOSIS — Z794 Long term (current) use of insulin: Secondary | ICD-10-CM | POA: Diagnosis not present

## 2023-04-21 DIAGNOSIS — E1165 Type 2 diabetes mellitus with hyperglycemia: Secondary | ICD-10-CM

## 2023-04-21 DIAGNOSIS — Z7984 Long term (current) use of oral hypoglycemic drugs: Secondary | ICD-10-CM

## 2023-04-21 DIAGNOSIS — E782 Mixed hyperlipidemia: Secondary | ICD-10-CM

## 2023-04-21 LAB — COMPREHENSIVE METABOLIC PANEL
ALT: 12 U/L (ref 0–53)
AST: 16 U/L (ref 0–37)
Albumin: 4.3 g/dL (ref 3.5–5.2)
Alkaline Phosphatase: 46 U/L (ref 39–117)
BUN: 14 mg/dL (ref 6–23)
CO2: 29 mEq/L (ref 19–32)
Calcium: 9.1 mg/dL (ref 8.4–10.5)
Chloride: 100 mEq/L (ref 96–112)
Creatinine, Ser: 0.78 mg/dL (ref 0.40–1.50)
GFR: 103.78 mL/min (ref >60.00)
Glucose, Bld: 171 mg/dL — ABNORMAL HIGH (ref 70–99)
Potassium: 4.1 mEq/L (ref 3.5–5.1)
Sodium: 139 mEq/L (ref 135–145)
Total Bilirubin: 0.3 mg/dL (ref 0.2–1.2)
Total Protein: 6.9 g/dL (ref 6.0–8.3)

## 2023-04-21 LAB — LIPID PANEL
Cholesterol: 125 mg/dL (ref 0–200)
HDL: 36.7 mg/dL — ABNORMAL LOW (ref >39.00)
LDL Cholesterol: 62 mg/dL (ref 0–99)
NonHDL: 88.74
Total CHOL/HDL Ratio: 3
Triglycerides: 135 mg/dL (ref 0.0–149.0)
VLDL: 27 mg/dL (ref 0.0–40.0)

## 2023-04-21 LAB — HEMOGLOBIN A1C: Hgb A1c MFr Bld: 6.8 % — ABNORMAL HIGH (ref 4.6–6.5)

## 2023-04-22 NOTE — Progress Notes (Unsigned)
Patient ID: Carlos Cordia., male   DOB: Mar 18, 1971, 52 y.o.   MRN: [id number]            Reason for Appointment: Type II Diabetes follow-up   History of Present Illness   Diagnosis date: 2014  Previous history:  Oral hypoglycemic drugs previously used are: Amaryl, glipizide, metformin He has been taking Jardiance more regularly since about 12/2020 Never been on insulin  A1c range in the last few years is: 6.4-8.4  Recent history:     Non-insulin hypoglycemic drugs: metformin 1 g twice daily      INSULIN doses: Lantus 10 units in the morning and 8 in the evening       Side effects from medications: None  Current self management, blood sugar patterns and problems identified:  A1c is 6.8  Recent GMI 6.9 Because of decreased appetite and weight loss he stopped taking Ozempic after his last visit even though it was not causing weight loss as of his last visit Also because of his insurance requirement of preferring Lantus instead of Toujeo he is taking this but doing better with splitting the dose to 10 units in the morning and 8 in the evening He has continued to increase this periodically based on his fasting readings Although his fasting readings are excellent now he is sporadically has significantly high readings sometimes over 250 with poor diet such as eating desserts or fast food including this morning for breakfast He has gained back 4-5 pounds of weight No hypoglycemic symptoms although today morning he had a glucose of 69 on his sensor Continues on metformin  Exercise: walking during the day especially with work  Analysis of freestyle libre 3 download for the last 14 days as follows  Overall variability is somewhat more compared to last visit Overnight blood sugars are generally within the target range and the lowest around 2-4 AM without hypoglycemia but occasionally low normal readings HYPERGLYCEMIC episodes are seen sporadically on some days either after lunch or  dinner and occasionally after breakfast with highest readings over 300 Premeal blood sugars are gradually rising between morning and evening  On an average blood sugars are the highest after dinner around 9 PM  CGM use % of time   2-week average/GV 150/33  Time in range 81  % Time Above 180 14+5  % Time above 250   % Time Below 70 0     PRE-MEAL Fasting Lunch Dinner Bedtime Overall  Glucose range:       Averages: 126  162     POST-MEAL PC Breakfast PC Lunch PC Dinner  Glucose range:     Averages: 153 167 191     Previously:      CGM use % of time   2-week average/GV   Time in range      82%  % Time Above 180 18  % Time above 250   % Time Below 70      PRE-MEAL Fasting Lunch Dinner Bedtime Overall  Glucose range:       Averages: 143       POST-MEAL PC Breakfast PC Lunch PC Dinner  Glucose range:     Averages: 161 156 172             Dietician visit: Most recent: Unknown     Weight control:  Wt Readings from Last 3 Encounters:  04/23/23 165 lb 6.4 oz (75 kg)  04/08/23 160 lb (72.6 kg)  03/28/23 161 lb 12.8  oz (73.4 kg)            Diabetes labs:  Lab Results  Component Value Date   HGBA1C 6.8 (H) 04/21/2023   HGBA1C 7.0 (A) 01/29/2023   HGBA1C 8.1 (H) 10/09/2022   Lab Results  Component Value Date   MICROALBUR <0.7 06/03/2022   LDLCALC 62 04/21/2023   CREATININE 0.78 04/21/2023     Allergies as of 04/23/2023       Reactions   Ambien [zolpidem Tartrate]    Crashed car day after, also hallucinating        Medication List        Accurate as of Apr 23, 2023 11:06 AM. If you have any questions, ask your nurse or doctor.          acetaminophen 325 MG tablet Commonly known as: TYLENOL Take 1-2 tablets (325-650 mg total) by mouth every 4 (four) hours as needed for mild pain.   aspirin EC 81 MG tablet Take 1 tablet (81 mg total) by mouth daily. Swallow whole.   atorvastatin 10 MG tablet Commonly known as: LIPITOR Take 1 tablet (10 mg  total) by mouth daily.   baclofen 20 MG tablet Commonly known as: LIORESAL Take 2 tablets (40 mg total) by mouth 3 (three) times daily. For spasticity   Dexcom G7 Sensor Misc Change every 10 days   FreeStyle Libre 3 Sensor Misc Use as instructed to check blood sugars   docusate sodium 100 MG capsule Commonly known as: COLACE Take 100 mg by mouth 2 (two) times daily.   gemfibrozil 600 MG tablet Commonly known as: LOPID TAKE 1 TABLET BY MOUTH TWICE  DAILY BEFORE A MEAL   Insulin Pen Needle 31G X 5 MM Misc With pen   Lantus SoloStar 100 UNIT/ML Solostar Pen Generic drug: insulin glargine Inject 14 units daily   metFORMIN 1000 MG tablet Commonly known as: GLUCOPHAGE Take 1 tablet (1,000 mg total) by mouth 2 (two) times daily with a meal.   mirtazapine 15 MG tablet Commonly known as: REMERON TAKE 1 TABLET BY MOUTH DAILY AT  BEDTIME   multivitamin with minerals Tabs tablet Take 1 tablet by mouth daily.   Oxycodone HCl 10 MG Tabs Take 1 tablet (10 mg total) by mouth 4 (four) times daily as needed. Do not refill until 11/25/2022   Ozempic (0.25 or 0.5 MG/DOSE) 2 MG/3ML Sopn Generic drug: Semaglutide(0.25 or 0.5MG /DOS) INJECT 0.25 FOR 4 WEEKS   PARoxetine 20 MG tablet Commonly known as: PAXIL Take by mouth.   sildenafil 100 MG tablet Commonly known as: Viagra Take 0.5-1 tablets (50-100 mg total) by mouth daily as needed for erectile dysfunction.   tamsulosin 0.4 MG Caps capsule Commonly known as: FLOMAX Take 1 capsule (0.4 mg total) by mouth daily.   terbinafine 250 MG tablet Commonly known as: LamISIL Take 1 tablet (250 mg total) by mouth daily.   traZODone 50 MG tablet Commonly known as: DESYREL TAKE 1-2 TABLETS (50-100 MG TOTAL) BY MOUTH AT BEDTIME. FOR SLEEP   Vitamin B Complex Tabs Take1 tablet by mouth daily.        Allergies:  Allergies  Allergen Reactions   Ambien [Zolpidem Tartrate]     Crashed car day after, also hallucinating    Past  Medical History:  Diagnosis Date   ADHD (attention deficit hyperactivity disorder)    Alcohol abuse    Depression    Diabetes mellitus without complication (HCC)    History of exercise stress test  03-18-2013--  normal   History of kidney stones    Hyperlipidemia    Kidney stones    Left ureteral stone    Type 2 diabetes mellitus [facility])     Past Surgical History:  Procedure Laterality Date   ANTERIOR CERVICAL DECOMP/DISCECTOMY FUSION N/A 08/30/2021   Procedure: CERVICAL FIVE-SIX ANTERIOR CERVICAL DECOMPRESSION/DISCECTOMY FUSION;  Surgeon: Bedelia Person, MD;  Location: [facility] OR;  Service: Neurosurgery;  Laterality: N/A;   CARDIAC CATHETERIZATION  11-27-2006  dr Reyes Ivan   non-obstructive CAD/  20% proximal and mid LAD/  perserved LVF,  ef 55-60%   CYSTOSCOPY W/ RETROGRADES Left 05/17/2015   Procedure: CYSTOSCOPY WITH RETROGRADE PYELOGRAM;  Surgeon: Jerilee Field, MD;  Location: [facility];  Service: Urology;  Laterality: Left;   CYSTOSCOPY/URETEROSCOPY/HOLMIUM LASER/STENT PLACEMENT Left 05/17/2015   Procedure: LEFT URETEROSCOPY/HOLMIUM LASER/STENT PLACEMENT;  Surgeon: Jerilee Field, MD;  Location: [facility];  Service: Urology;  Laterality: Left;   EXTRACORPOREAL SHOCK WAVE LITHOTRIPSY Left 05-08-2015   EXTRACTION RIGHT MANDIBULAR , PREMOLAR/  MAXILLARY MANDIBULAR FIXATION WITH SCREWS  08-03-2008   LUMBAR PERCUTANEOUS PEDICLE SCREW 4 LEVEL N/A 09/04/2021   Procedure: Thoracic Four-Thoracic Eight  Percutaneous Instrumented Fusion;  Surgeon: Bedelia Person, MD;  Location: [facility] OR;  Service: Neurosurgery;  Laterality: N/A;   ORIF FOUR HOLD PLATE AND MAXILLOMANDIBULAR FIXATION W/ ARCH BARS  08-05-2008   REMOVAL ARCH BARS 09-15-2008   TRANSTHORACIC ECHOCARDIOGRAM  04-06-2008  dr Reyes Ivan   normal LVF,  ef 55-60%,  trivial MR and TR    Family History  Problem Relation Age of Onset   Cancer Mother    Diabetes Mother        type 1   Heart disease Mother         pacemaker   Cancer - Colon Mother    Alcohol abuse Father    Alcohol abuse Maternal Uncle    Diabetes Maternal Grandmother    Alcohol abuse Maternal Grandfather    Diabetes Maternal Grandfather    Prostate cancer Neg Hx     Social History:  reports that he has never smoked. He has never used smokeless tobacco. He reports current alcohol use of about 10.0 standard drinks of alcohol per week. He reports that he does not use drugs.  Review of Systems:  Last diabetic eye exam date unknown  Last foot exam date: 11/22  Symptoms of neuropathy: None  Hypertension: Not present  BP Readings from Last 3 Encounters:  04/23/23 120/70  04/08/23 125/82  03/28/23 119/80    Lipids: Has had mixed hyperlipidemia From PCP was only on gemfibrozil 600 mg twice daily Lipitor was restarted recently, LDL was higher at 150 previously in 10/23 He is tolerating this well and LDL is below 70 now Also triglycerides are excellent    Lab Results  Component Value Date   CHOL 125 04/21/2023   CHOL 209 (H) 10/09/2022   CHOL 212 (H) 07/12/2021   Lab Results  Component Value Date   HDL 36.70 (L) 04/21/2023   HDL 44.90 10/09/2022   HDL 56 07/12/2021   Lab Results  Component Value Date   LDLCALC 62 04/21/2023   LDLCALC 150 (H) 10/09/2022   LDLCALC 114 (H) 07/12/2021   Lab Results  Component Value Date   TRIG 135.0 04/21/2023   TRIG 68.0 10/09/2022   TRIG 315 (H) 07/12/2021   Lab Results  Component Value Date   CHOLHDL 3 04/21/2023   CHOLHDL 5 10/09/2022   CHOLHDL 3.8  07/12/2021   Lab Results  Component Value Date   LDLDIRECT 130.0 08/26/2017   LDLDIRECT 65.0 03/21/2015   LDLDIRECT 84.6 07/22/2014      Examination:   BP 120/70 (BP Location: Right Arm, Patient Position: Sitting, Cuff Size: Normal)   Pulse 74   Ht 6\' 4"  (1.93 m)   Wt 165 lb 6.4 oz (75 kg)   SpO2 98%   BMI 20.13 kg/m   Body mass index is 20.13 kg/m.    ASSESSMENT/ PLAN:    Diabetes type 2:    Current regimen: Lantus twice a day total 18 units and metformin 1 g twice daily  A1c is relatively better at 6.8   He has fairly well with only low-dose basal insulin along with his metformin However he is having periodic postprandial readings when he is not eating low-fat and low carbohydrate meals  He does not want to take Ozempic because he apparently was starting to get some weight loss after increasing the dose Since he is preferring to take mealtime insulin instead of eating low carbohydrate and low-fat meals He will start Lyumjev with a sample 3 to 4 units for any meal that has significant amount of carbohydrate, desserts or fast food Today discussed in detail the utility of mealtime insulin to cover postprandial spikes, action of mealtime insulin, use of the insulin pen, timing and action of the rapid acting insulin as well as starting dose and dosage titration to target the two-hour reading of under 180 Preauthorization for various prescriptions has been done as needed including for libre sensors  Hyperlipidemia: Much better controlled and tolerating Lipitor Since LDL is only 62 he can try and leave off gemfibrozil as this interacts with Lipitor Also likely triglycerides will stay normal with better diabetes control Discussed watching high saturated fat foods, meats and desserts unless low-fat  There are no Patient Instructions on file for this visit.   Carlos [name] 04/23/2023, 11:06 AM

## 2023-04-23 ENCOUNTER — Encounter: Payer: Self-pay | Admitting: Endocrinology

## 2023-04-23 ENCOUNTER — Ambulatory Visit: Payer: Medicaid Other | Admitting: Endocrinology

## 2023-04-23 VITALS — BP 120/70 | HR 74 | Ht 76.0 in | Wt 165.4 lb

## 2023-04-23 DIAGNOSIS — Z79899 Other long term (current) drug therapy: Secondary | ICD-10-CM | POA: Diagnosis not present

## 2023-04-23 DIAGNOSIS — E119 Type 2 diabetes mellitus without complications: Secondary | ICD-10-CM

## 2023-04-23 DIAGNOSIS — R030 Elevated blood-pressure reading, without diagnosis of hypertension: Secondary | ICD-10-CM | POA: Diagnosis not present

## 2023-04-23 DIAGNOSIS — M545 Low back pain, unspecified: Secondary | ICD-10-CM | POA: Diagnosis not present

## 2023-04-23 DIAGNOSIS — Z794 Long term (current) use of insulin: Secondary | ICD-10-CM

## 2023-04-23 DIAGNOSIS — R252 Cramp and spasm: Secondary | ICD-10-CM | POA: Diagnosis not present

## 2023-04-23 DIAGNOSIS — E782 Mixed hyperlipidemia: Secondary | ICD-10-CM

## 2023-04-23 DIAGNOSIS — E109 Type 1 diabetes mellitus without complications: Secondary | ICD-10-CM | POA: Diagnosis not present

## 2023-04-23 DIAGNOSIS — Z681 Body mass index (BMI) 19 or less, adult: Secondary | ICD-10-CM | POA: Diagnosis not present

## 2023-04-24 ENCOUNTER — Encounter: Payer: Self-pay | Admitting: Endocrinology

## 2023-04-28 DIAGNOSIS — Z79899 Other long term (current) drug therapy: Secondary | ICD-10-CM | POA: Diagnosis not present

## 2023-04-29 ENCOUNTER — Ambulatory Visit: Payer: Medicaid Other | Admitting: Physician Assistant

## 2023-04-29 DIAGNOSIS — R252 Cramp and spasm: Secondary | ICD-10-CM | POA: Diagnosis not present

## 2023-05-02 ENCOUNTER — Ambulatory Visit: Payer: Medicaid Other | Admitting: Physician Assistant

## 2023-05-02 VITALS — BP 105/67 | HR 81 | Ht 76.0 in | Wt 162.4 lb

## 2023-05-02 DIAGNOSIS — E291 Testicular hypofunction: Secondary | ICD-10-CM | POA: Diagnosis not present

## 2023-05-02 MED ORDER — TESTOSTERONE CYPIONATE 200 MG/ML IM SOLN
200.0000 mg | INTRAMUSCULAR | 0 refills | Status: DC
Start: 2023-05-02 — End: 2024-03-09

## 2023-05-02 MED ORDER — BD ECLIPSE NEEDLE 21G X 1-1/2" MISC
1 refills | Status: DC
Start: 2023-05-02 — End: 2023-07-23

## 2023-05-02 MED ORDER — SYRINGE 2-3 ML 3 ML MISC
2 refills | Status: DC
Start: 2023-05-02 — End: 2023-10-08

## 2023-05-02 MED ORDER — BD ECLIPSE SHIELDED NEEDLE 18G X 1-1/2" MISC
1 refills | Status: DC
Start: 2023-05-02 — End: 2023-07-23

## 2023-05-02 NOTE — Progress Notes (Signed)
05/02/2023 10:06 AM   Carlos Sprinkles Jr. 22-Sep-1971 [id number]  CC: Chief Complaint  Patient presents with   discuss testosterone treatment    HPI: Carlos [name]. is a 52 y.o. male with PMH nephrolithiasis, BPH with LUTS on Flomax, ED on sildenafil, and hypogonadism who presents today discuss TRT.   He had 2 low testosterone levels, 173 on 04/08/2023 (drawn at 1422) and 271 on 04/15/2023 (drawn at 0807).  Today he reports he does not desire future fertility.  He is familiar with giving himself injections.  He wishes to pursue TRT.  PMH: Past Medical History:  Diagnosis Date   ADHD (attention deficit hyperactivity disorder)    Alcohol abuse    Depression    Diabetes mellitus without complication (HCC)    History of exercise stress test    03-18-2013--  normal   History of kidney stones    Hyperlipidemia    Kidney stones    Left ureteral stone    Type 2 diabetes mellitus [facility])     Surgical History: Past Surgical History:  Procedure Laterality Date   ANTERIOR CERVICAL DECOMP/DISCECTOMY FUSION N/A 08/30/2021   Procedure: CERVICAL FIVE-SIX ANTERIOR CERVICAL DECOMPRESSION/DISCECTOMY FUSION;  Surgeon: Bedelia Person, MD;  Location: [facility] OR;  Service: Neurosurgery;  Laterality: N/A;   CARDIAC CATHETERIZATION  11-27-2006  dr Reyes Ivan   non-obstructive CAD/  20% proximal and mid LAD/  perserved LVF,  ef 55-60%   CYSTOSCOPY W/ RETROGRADES Left 05/17/2015   Procedure: CYSTOSCOPY WITH RETROGRADE PYELOGRAM;  Surgeon: Jerilee Field, MD;  Location: [facility];  Service: Urology;  Laterality: Left;   CYSTOSCOPY/URETEROSCOPY/HOLMIUM LASER/STENT PLACEMENT Left 05/17/2015   Procedure: LEFT URETEROSCOPY/HOLMIUM LASER/STENT PLACEMENT;  Surgeon: Jerilee Field, MD;  Location: [facility];  Service: Urology;  Laterality: Left;   EXTRACORPOREAL SHOCK WAVE LITHOTRIPSY Left 05-08-2015   EXTRACTION RIGHT MANDIBULAR , PREMOLAR/  MAXILLARY MANDIBULAR FIXATION WITH  SCREWS  08-03-2008   LUMBAR PERCUTANEOUS PEDICLE SCREW 4 LEVEL N/A 09/04/2021   Procedure: Thoracic Four-Thoracic Eight  Percutaneous Instrumented Fusion;  Surgeon: Bedelia Person, MD;  Location: [facility] OR;  Service: Neurosurgery;  Laterality: N/A;   ORIF FOUR HOLD PLATE AND MAXILLOMANDIBULAR FIXATION W/ ARCH BARS  08-05-2008   REMOVAL ARCH BARS 09-15-2008   TRANSTHORACIC ECHOCARDIOGRAM  04-06-2008  dr Reyes Ivan   normal LVF,  ef 55-60%,  trivial MR and TR    Home Medications:  Allergies as of 05/02/2023       Reactions   Ambien [zolpidem Tartrate]    Crashed car day after, also hallucinating        Medication List        Accurate as of May 02, 2023 10:06 AM. If you have any questions, ask your nurse or doctor.          acetaminophen 325 MG tablet Commonly known as: TYLENOL Take 1-2 tablets (325-650 mg total) by mouth every 4 (four) hours as needed for mild pain.   aspirin EC 81 MG tablet Take 1 tablet (81 mg total) by mouth daily. Swallow whole.   atorvastatin 10 MG tablet Commonly known as: LIPITOR Take 1 tablet (10 mg total) by mouth daily.   baclofen 20 MG tablet Commonly known as: LIORESAL Take 2 tablets (40 mg total) by mouth 3 (three) times daily. For spasticity   Dexcom G7 Sensor Misc Change every 10 days   FreeStyle Libre 3 Sensor Misc Use as instructed to check blood sugars   docusate sodium 100 MG  capsule Commonly known as: COLACE Take 100 mg by mouth 2 (two) times daily.   Insulin Pen Needle 31G X 5 MM Misc With pen   Lantus SoloStar 100 UNIT/ML Solostar Pen Generic drug: insulin glargine Inject 14 units daily   metFORMIN 1000 MG tablet Commonly known as: GLUCOPHAGE Take 1 tablet (1,000 mg total) by mouth 2 (two) times daily with a meal.   mirtazapine 15 MG tablet Commonly known as: REMERON TAKE 1 TABLET BY MOUTH DAILY AT  BEDTIME   multivitamin with minerals Tabs tablet Take 1 tablet by mouth daily.   Oxycodone HCl 10 MG Tabs Take 1  tablet (10 mg total) by mouth 4 (four) times daily as needed. Do not refill until 11/25/2022   PARoxetine 20 MG tablet Commonly known as: PAXIL Take by mouth.   sildenafil 100 MG tablet Commonly known as: Viagra Take 0.5-1 tablets (50-100 mg total) by mouth daily as needed for erectile dysfunction.   tamsulosin 0.4 MG Caps capsule Commonly known as: FLOMAX Take 1 capsule (0.4 mg total) by mouth daily.   terbinafine 250 MG tablet Commonly known as: LamISIL Take 1 tablet (250 mg total) by mouth daily.   traZODone 50 MG tablet Commonly known as: DESYREL TAKE 1-2 TABLETS (50-100 MG TOTAL) BY MOUTH AT BEDTIME. FOR SLEEP   Vitamin B Complex Tabs Take1 tablet by mouth daily.        Allergies:  Allergies  Allergen Reactions   Ambien [Zolpidem Tartrate]     Crashed car day after, also hallucinating    Family History: Family History  Problem Relation Age of Onset   Cancer Mother    Diabetes Mother        type 1   Heart disease Mother        pacemaker   Cancer - Colon Mother    Alcohol abuse Father    Alcohol abuse Maternal Uncle    Diabetes Maternal Grandmother    Alcohol abuse Maternal Grandfather    Diabetes Maternal Grandfather    Prostate cancer Neg Hx     Social History:   reports that he has never smoked. He has never used smokeless tobacco. He reports current alcohol use of about 10.0 standard drinks of alcohol per week. He reports that he does not use drugs.  Physical Exam: BP 105/67   Pulse 81   Ht 6\' 4"  (1.93 m)   Wt 162 lb 6 oz (73.7 kg)   BMI 19.76 kg/m   Constitutional:  Alert and oriented, no acute distress, nontoxic appearing HEENT: [place], AT Cardiovascular: No clubbing, cyanosis, or edema Respiratory: Normal respiratory effort, no increased work of breathing Skin: No rashes, bruises or suspicious lesions Neurologic: Grossly intact, no focal deficits, moving all 4 extremities Psychiatric: Normal mood and affect  Assessment & Plan:   1.  Hypogonadism in male Two low testosterone levels consistent with hypogonadism.  We discussed various treatment options including Clomid versus exogenous testosterone, with formulations including topical gels, subQ injections, IM injections, p.o. pills, and pellets.  We discussed that therapy is often driven by insurance coverage, with gel and IM injections being used most commonly.  He is very familiar with giving himself injections and would like to try injections first, which is reasonable.  We discussed that TRT will require monitoring of his testosterone and other labs due to increased risk for heart attack and stroke.  Will prescribe testosterone cypionate 200 mg every 2 weeks and see him back in clinic next week for injection  teaching.  If there are any issues with his insurance coverage that would require a second a.m. testosterone, we can obtain this next week prior to his teaching visit. - testosterone cypionate (DEPOTESTOSTERONE CYPIONATE) 200 MG/ML injection; Inject 1 mL (200 mg total) into the muscle every 14 (fourteen) days.  Dispense: 10 mL; Refill: 0 - NEEDLE, DISP, 18 G (BD ECLIPSE SHIELDED NEEDLE) 18G X 1-1/2" MISC; Use one needle every 2 weeks to draw the medication up into the syringe prior to injection.  Dispense: 50 each; Refill: 1 - NEEDLE, DISP, 21 G (BD ECLIPSE NEEDLE) 21G X 1-1/2" MISC; Use one needle every 2 weeks to inject testosterone into the muscle.  Dispense: 50 each; Refill: 1 - Syringe, Disposable, (2-3CC SYRINGE) 3 ML MISC; Use one syringe every 2 weeks with testosterone injection.  Dispense: 25 each; Refill: 2   Return in about 1 week (around 05/09/2023) for Testosterone injection teaching.  Carman Ching, PA-C  [facility] [place] [address] [place], [place] [address] [phone]

## 2023-05-08 ENCOUNTER — Ambulatory Visit: Payer: Medicaid Other | Admitting: Physician Assistant

## 2023-05-09 ENCOUNTER — Ambulatory Visit: Payer: Medicaid Other | Admitting: Physician Assistant

## 2023-05-09 VITALS — BP 120/77 | HR 71 | Ht 76.0 in | Wt 162.0 lb

## 2023-05-09 DIAGNOSIS — E291 Testicular hypofunction: Secondary | ICD-10-CM | POA: Diagnosis not present

## 2023-05-09 NOTE — Patient Instructions (Signed)
   Instructions for disposing of "Sharps" Disposal of syringes and other sharp objects is monitored by the Environmental Protection Agency (EPA). It is important to dispose of them properly for your safety and for the safety of others.  The EPA promotes all recycling activities, and therefore encourages you to discard medical waste sharps in sturdy, non-recyclable containers, when possible.  Your stat or community environmental programs may have other requirements or suggestions for disposing of your medical waste.  You should contact your local EPA office for any information you may need.  What container should be used Place needles, syringes, lancets and other sharp objects in a hard plastic or metal container with a screw on or tightly secured lid.  Many containers found in the household will do, or you may purchase containers specifically designed for disposal of medical wast sharps.  If a recyclable container is used to dispose of medical waste sharps, make sure that you don't mix the container with other materials to be recycled.  Since the sharps impair a containers recyclability, a container holding your medical waste sharps properly belongs with the regular household trash.  You should label the container "Not for Recycling".  In addition, make sure your sharps container is made of non breakable material and has a lid that can be securely closed (screwed on or tightly secured).  Before discarding a container, be sure to reinforce the lid with heavy-duty tape.  Do not put sharpe objects in a container you plan to recycle or return to a store, and do not use glass or clear plastic containers (see additional information below).  Finally, make sure that you keep all containers with sharp objects out of the reach of children and pets.  Your home care provider may deliver a sharps container with your medical supplies.  If so place all needles, syringes and lancets in this container and notify the  company when the container is approximately 75% full.  Your home care provider will arrange for pickup of the container.  For your safety, do NOT bring your container to the hospital or [facility] for disposal.        Tips for minimizing injection pain- -inject medicine that is at room temperature -remove all air bubbles from the syringe before injection -wait until the topical alcohol has evaporated before injecting -keep muscles in the injection area relaxed -break through the skin quickly -don't change the direction of the needle as it goes in or comes out -do not reuse disposable needles  

## 2023-05-09 NOTE — Progress Notes (Signed)
Patient presents today for Testosterone injection teaching. Patient was instructed on how to properly use the 18guage needle to draw up 1cc of the testosterone, into 3cc syringe then changed the needle to the 21guage for injection. Patient then cleaned the vastus lateralis with an alcohol swab and injected the site with bevel up.   Patient dose:200 mg Lot Number:[id number] Expiration date:12/2025 Location: Right  Patient verbalized understanding current dose is 1ml every 14days unless instructed by a provider.  Patient tolerated well.  Patient understood how to dispose of sharps properly and store medication.   Performed by: Carman Ching, PA-C  Follow up: Return in about 7 weeks (around 06/27/2023) for Lab visit for testosterone, PSA, H&H.

## 2023-05-12 ENCOUNTER — Encounter: Payer: Self-pay | Admitting: Endocrinology

## 2023-05-13 NOTE — Telephone Encounter (Signed)
Please Advise,

## 2023-05-23 DIAGNOSIS — R252 Cramp and spasm: Secondary | ICD-10-CM | POA: Diagnosis not present

## 2023-05-23 DIAGNOSIS — Z79899 Other long term (current) drug therapy: Secondary | ICD-10-CM | POA: Diagnosis not present

## 2023-05-23 DIAGNOSIS — R030 Elevated blood-pressure reading, without diagnosis of hypertension: Secondary | ICD-10-CM | POA: Diagnosis not present

## 2023-05-23 DIAGNOSIS — E109 Type 1 diabetes mellitus without complications: Secondary | ICD-10-CM | POA: Diagnosis not present

## 2023-05-23 DIAGNOSIS — Z681 Body mass index (BMI) 19 or less, adult: Secondary | ICD-10-CM | POA: Diagnosis not present

## 2023-05-23 DIAGNOSIS — M545 Low back pain, unspecified: Secondary | ICD-10-CM | POA: Diagnosis not present

## 2023-05-26 ENCOUNTER — Other Ambulatory Visit: Payer: Self-pay | Admitting: Physician Assistant

## 2023-05-26 DIAGNOSIS — E291 Testicular hypofunction: Secondary | ICD-10-CM

## 2023-05-29 ENCOUNTER — Emergency Department (HOSPITAL_BASED_OUTPATIENT_CLINIC_OR_DEPARTMENT_OTHER)
Admission: EM | Admit: 2023-05-29 | Discharge: 2023-05-29 | Disposition: A | Discharge status: Home / Self Care | Payer: Medicaid Other | Attending: Emergency Medicine | Admitting: Emergency Medicine

## 2023-05-29 ENCOUNTER — Other Ambulatory Visit: Payer: Self-pay

## 2023-05-29 ENCOUNTER — Emergency Department (HOSPITAL_BASED_OUTPATIENT_CLINIC_OR_DEPARTMENT_OTHER): Payer: Medicaid Other

## 2023-05-29 ENCOUNTER — Encounter (HOSPITAL_BASED_OUTPATIENT_CLINIC_OR_DEPARTMENT_OTHER): Payer: Self-pay | Admitting: Urology

## 2023-05-29 DIAGNOSIS — M25522 Pain in left elbow: Secondary | ICD-10-CM | POA: Diagnosis not present

## 2023-05-29 DIAGNOSIS — Z7984 Long term (current) use of oral hypoglycemic drugs: Secondary | ICD-10-CM | POA: Insufficient documentation

## 2023-05-29 DIAGNOSIS — L03032 Cellulitis of left toe: Secondary | ICD-10-CM | POA: Diagnosis not present

## 2023-05-29 DIAGNOSIS — E119 Type 2 diabetes mellitus without complications: Secondary | ICD-10-CM | POA: Insufficient documentation

## 2023-05-29 DIAGNOSIS — M25521 Pain in right elbow: Secondary | ICD-10-CM | POA: Diagnosis not present

## 2023-05-29 DIAGNOSIS — L03012 Cellulitis of left finger: Secondary | ICD-10-CM | POA: Diagnosis not present

## 2023-05-29 DIAGNOSIS — Z7982 Long term (current) use of aspirin: Secondary | ICD-10-CM | POA: Diagnosis not present

## 2023-05-29 DIAGNOSIS — Z79899 Other long term (current) drug therapy: Secondary | ICD-10-CM | POA: Diagnosis not present

## 2023-05-29 DIAGNOSIS — Z794 Long term (current) use of insulin: Secondary | ICD-10-CM | POA: Diagnosis not present

## 2023-05-29 MED ORDER — DOXYCYCLINE HYCLATE 100 MG PO CAPS: 100.0000 mg | ORAL_CAPSULE | Freq: Two times a day (BID) | ORAL | 0 refills | Status: DC

## 2023-05-29 NOTE — ED Provider Notes (Signed)
[place] [facility] AT [facility] Provider Note   CSN: [id number] Arrival date & time: 05/29/23  1903     History  Chief Complaint  Patient presents with   Nail Problem   Elbow Injury    Carlos [name]. is a 52 y.o. male.  Patient presents with 2 concerns.  1 is some pain in the medial aspect of the right elbow has been ongoing for a month.  Thinks he may have banged it on something.  The other is concern for a toenail infection of his left second toe.  Patient is a diabetic he is concerned about any kind of serious infection.  That is just been noted for a few days.  Patient does not have a podiatrist currently.  Past medical history significant for hyperlipidemia alcohol abuse type 2 diabetes history of kidney stones.  Patient has no known allergies.       Home Medications Prior to Admission medications   Medication Sig Start Date End Date Taking? Authorizing Provider  doxycycline (VIBRAMYCIN) 100 MG capsule Take 1 capsule (100 mg total) by mouth 2 (two) times daily. 05/29/23  Yes Vanetta Mulders, MD  acetaminophen (TYLENOL) 325 MG tablet Take 1-2 tablets (325-650 mg total) by mouth every 4 (four) hours as needed for mild pain. 10/03/21   Angiulli, Mcarthur Rossetti, PA-C  aspirin EC 81 MG EC tablet Take 1 tablet (81 mg total) by mouth daily. Swallow whole. 10/03/21   Angiulli, Mcarthur Rossetti, PA-C  atorvastatin (LIPITOR) 10 MG tablet Take 1 tablet (10 mg total) by mouth daily. 04/14/23   Reather Littler, MD  B Complex Vitamins (VITAMIN B COMPLEX) TABS Take1 tablet by mouth daily. 10/03/21   Angiulli, Mcarthur Rossetti, PA-C  baclofen (LIORESAL) 20 MG tablet Take 2 tablets (40 mg total) by mouth 3 (three) times daily. For spasticity 03/11/23   Georganna Skeans, MD  BUTRANS 20 MCG/HR PTWK 1 patch once a week. 05/02/23   [provider]  Continuous Blood Gluc Sensor (FREESTYLE LIBRE 3 SENSOR) MISC Use as instructed to check blood sugars 01/30/23   Reather Littler, MD  docusate sodium  (COLACE) 100 MG capsule Take 100 mg by mouth 2 (two) times daily.    [provider]  insulin glargine (LANTUS SOLOSTAR) 100 UNIT/ML Solostar Pen Inject 14 units daily 02/10/23   Reather Littler, MD  Insulin Pen Needle 31G X 5 MM MISC With pen 10/11/22   Reather Littler, MD  metFORMIN (GLUCOPHAGE) 1000 MG tablet Take 1 tablet (1,000 mg total) by mouth 2 (two) times daily with a meal. 03/10/23   Reather Littler, MD  mirtazapine (REMERON) 15 MG tablet TAKE 1 TABLET BY MOUTH DAILY AT  BEDTIME 02/13/23   Georganna Skeans, MD  Multiple Vitamin (MULTIVITAMIN WITH MINERALS) TABS tablet Take 1 tablet by mouth daily. 10/03/21   Angiulli, Mcarthur Rossetti, PA-C  NEEDLE, DISP, 18 G (BD ECLIPSE SHIELDED NEEDLE) 18G X 1-1/2" MISC Use one needle every 2 weeks to draw the medication up into the syringe prior to injection. 05/02/23   Vaillancourt, Samantha, PA-C  NEEDLE, DISP, 21 G (BD ECLIPSE NEEDLE) 21G X 1-1/2" MISC Use one needle every 2 weeks to inject testosterone into the muscle. 05/02/23   Vaillancourt, Lelon Mast, PA-C  Oxycodone HCl 10 MG TABS Take 1 tablet (10 mg total) by mouth 4 (four) times daily as needed. Do not refill until 11/25/2022 10/29/22   Jones Bales, NP  Syringe, Disposable, (2-3CC SYRINGE) 3 ML MISC Use one syringe  every 2 weeks with testosterone injection. 05/02/23   Carman Ching, PA-C  tamsulosin (FLOMAX) 0.4 MG CAPS capsule Take 1 capsule (0.4 mg total) by mouth daily. 02/27/23   Georganna Skeans, MD  testosterone cypionate (DEPOTESTOSTERONE CYPIONATE) 200 MG/ML injection Inject 1 mL (200 mg total) into the muscle every 14 (fourteen) days. 05/02/23   Carman Ching, PA-C      Allergies    Patient has no known allergies.    Review of Systems   Review of Systems  Constitutional:  Negative for chills and fever.  HENT:  Negative for ear pain and sore throat.   Eyes:  Negative for pain and visual disturbance.  Respiratory:  Negative for cough and shortness of breath.   Cardiovascular:   Negative for chest pain and palpitations.  Gastrointestinal:  Negative for abdominal pain and vomiting.  Genitourinary:  Negative for dysuria and hematuria.  Musculoskeletal:  Positive for joint swelling. Negative for arthralgias and back pain.  Skin:  Negative for color change and rash.  Neurological:  Negative for seizures and syncope.  All other systems reviewed and are negative.   Physical Exam Updated Vital Signs BP 129/80 (BP Location: Right Arm)   Pulse 85   Temp 98.4 F (36.9 C) (Oral)   Resp 18   Ht 1.93 m (6\' 4" )   Wt 74.8 kg   SpO2 99%   BMI 20.08 kg/m  Physical Exam Vitals and nursing note reviewed.  Constitutional:      General: He is not in acute distress.    Appearance: Normal appearance. He is well-developed.  HENT:     Head: Normocephalic and atraumatic.  Eyes:     Extraocular Movements: Extraocular movements intact.     Conjunctiva/sclera: Conjunctivae normal.     Pupils: Pupils are equal, round, and reactive to light.  Cardiovascular:     Rate and Rhythm: Normal rate and regular rhythm.     Heart sounds: No murmur heard. Pulmonary:     Effort: Pulmonary effort is normal. No respiratory distress.     Breath sounds: Normal breath sounds.  Abdominal:     Palpations: Abdomen is soft.     Tenderness: There is no abdominal tenderness.  Musculoskeletal:        General: Tenderness present. No swelling.     Cervical back: Normal range of motion and neck supple.     Comments: Right elbow with some tenderness to the medial tendon no erythema no evidence of joint swelling.  No bursitis.  Reasonable range of motion distally radial pulse is 2+ and good cap refill to the fingers neurovascularly intact.  Patient's left foot second toe has an early paronychial infection with some erythema and may be a slight amount of superficial purulence.  Not currently draining.  No sausage toe.  No swelling or redness to the foot.  No evidence of fungal infection to the nail.   Skin:    General: Skin is warm and dry.     Capillary Refill: Capillary refill takes less than 2 seconds.  Neurological:     General: No focal deficit present.     Mental Status: He is alert and oriented to person, place, and time.  Psychiatric:        Mood and Affect: Mood normal.     ED Results / Procedures / Treatments   Labs (all labs ordered are listed, but only abnormal results are displayed) Labs Reviewed - No data to display  EKG None  Radiology DG Elbow Complete  Right  Result Date: 05/29/2023 CLINICAL DATA:  Right pain, injury EXAM: RIGHT ELBOW - COMPLETE 3+ VIEW COMPARISON:  None Available. FINDINGS: There is no evidence of fracture, dislocation, or joint effusion. There is no evidence of arthropathy or other focal bone abnormality. Soft tissues are unremarkable. IMPRESSION: Negative. Electronically Signed   By: Charlett Nose M.D.   On: 05/29/2023 19:47    Procedures Procedures    Medications Ordered in ED Medications - No data to display  ED Course/ Medical Decision Making/ A&P                             Medical Decision Making Amount and/or Complexity of Data Reviewed Radiology: ordered.  Risk Prescription drug management.   Early paronychial infection to the left second toe.  Will treat with doxycycline and soaks.  Recommend follow-up with podiatry or getting involved with a podiatrist since he is diabetic.  Follow-up with primary care doctor.  Recommend follow-up with orthopedics for the left elbow which I think may be a tendinitis or an inflamed ligament.  Patient does not want sling at this time.  Patient does have an orthopedic doctor to follow-up with.  Final Clinical Impression(s) / ED Diagnoses Final diagnoses:  Elbow pain, right  Acute paronychia of toe of left foot    Rx / DC Orders ED Discharge Orders          Ordered    doxycycline (VIBRAMYCIN) 100 MG capsule  2 times daily        05/29/23 2204              Vanetta Mulders,  MD 05/29/23 2208

## 2023-05-29 NOTE — ED Triage Notes (Signed)
Left second toe nail infection, redness and purulent noted  Recent treatment for toe fungus  Also c/o right elbow pain after injury 1 month ago  H/o DM

## 2023-05-29 NOTE — Discharge Instructions (Signed)
Consider following up with podiatry which would be helpful in the future.  The left second toe has early paronychial infection take the antibiotic doxycycline as directed soak in warm water for 20 minutes each day if infection gets worse get seen.  X-rays of the right elbow without evidence of any fluid or bony abnormality.  But would recommend following up with the orthopedics.  Could be a tendinitis or an inflamed ligament.

## 2023-06-03 ENCOUNTER — Telehealth: Payer: Self-pay | Admitting: Family Medicine

## 2023-06-03 NOTE — Telephone Encounter (Signed)
Called patient to schedule hospital follow up within 7 days patient refused appointment.

## 2023-06-04 ENCOUNTER — Other Ambulatory Visit: Payer: Self-pay | Admitting: Physical Medicine and Rehabilitation

## 2023-06-06 ENCOUNTER — Other Ambulatory Visit: Payer: Self-pay | Admitting: Family Medicine

## 2023-06-06 DIAGNOSIS — E11649 Type 2 diabetes mellitus with hypoglycemia without coma: Secondary | ICD-10-CM

## 2023-06-06 MED ORDER — MIRTAZAPINE 15 MG PO TABS: ORAL_TABLET | ORAL | 1 refills | Status: DC
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
## 2023-06-06 NOTE — Telephone Encounter (Signed)
Requested Prescriptions  Pending Prescriptions Disp Refills   mirtazapine (REMERON) 15 MG tablet 90 tablet 1    Sig: TAKE 1 TABLET BY MOUTH DAILY AT  BEDTIME     Psychiatry: Antidepressants - mirtazapine Passed - 06/06/2023  5:41 PM      Passed - Completed PHQ-2 or PHQ-9 in the last 360 days      Passed - Valid encounter within last 6 months    Recent Outpatient Visits           2 months ago Sexual dysfunction   [place] [facility], MD   3 months ago Chronic pain disorder   [place] [facility], MD   8 months ago Type 2 diabetes mellitus with other specified complication, with long-term current use of insulin (HCC)   [place] [facility], MD   1 year ago Uncontrolled type 2 diabetes mellitus with hypoglycemia without coma [facility])   [place] [facility], MD   1 year ago Encounter to establish care    Primary [facility] at [facility], Carlos Pounds, NP       Future Appointments             In 5 days Carlos Cote, MD [facility] at [facility], [place]             metFORMIN (GLUCOPHAGE) 1000 MG tablet 180 tablet 1    Sig: Take 1 tablet (1,000 mg total) by mouth 2 (two) times daily with a meal.     Endocrinology:  Diabetes - Biguanides Failed - 06/06/2023  5:41 PM      Failed - B12 Level in normal range and within 720 days    No results found for: "VITAMINB12"       Failed - CBC within normal limits and completed in the last 12 months    WBC  Date Value Ref Range Status  10/01/2021 6.0 4.0 - 10.5 K/uL Final   RBC  Date Value Ref Range Status  10/01/2021 3.69 (L) 4.22 - 5.81 MIL/uL Final   Hemoglobin  Date Value Ref Range Status  10/01/2021 11.4 (L) 13.0 - 17.0 g/dL Final   HCT  Date Value Ref Range Status  10/01/2021 35.2 (L) 39.0 - 52.0 % Final   MCHC  Date Value Ref Range Status   10/01/2021 32.4 30.0 - 36.0 g/dL Final   [facility]  Date Value Ref Range Status  10/01/2021 30.9 26.0 - 34.0 pg Final   MCV  Date Value Ref Range Status  10/01/2021 95.4 80.0 - 100.0 fL Final   No results found for: "PLTCOUNTKUC", "LABPLAT", "POCPLA" RDW  Date Value Ref Range Status  10/01/2021 11.9 11.5 - 15.5 % Final         Passed - Cr in normal range and within 360 days    Creat  Date Value Ref Range Status  07/12/2021 0.93 0.60 - 1.29 mg/dL Final   Creatinine, Ser  Date Value Ref Range Status  04/21/2023 0.78 0.40 - 1.50 mg/dL Final   Creatinine,U  Date Value Ref Range Status  06/03/2022 64.2 mg/dL Final         Passed - HBA1C is between 0 and 7.9 and within 180 days    Hgb A1c MFr Bld  Date Value Ref Range Status  04/21/2023 6.8 (H) 4.6 - 6.5 % Final    Comment:  Glycemic Control Guidelines for People with Diabetes:Non Diabetic:  <6%Goal of Therapy: <7%Additional Action Suggested:  >8%          Passed - eGFR in normal range and within 360 days    GFR calc Af Amer  Date Value Ref Range Status  03/13/2020 >60 >60 mL/min Final   GFR, Estimated  Date Value Ref Range Status  10/01/2021 >60 >60 mL/min Final    Comment:    (NOTE) Calculated using the CKD-EPI Creatinine Equation (2021)    GFR  Date Value Ref Range Status  04/21/2023 103.78 >60.00 mL/min Final    Comment:    Calculated using the CKD-EPI Creatinine Equation (2021)         Passed - Valid encounter within last 6 months    Recent Outpatient Visits           2 months ago Sexual dysfunction   [place] [facility], MD   3 months ago Chronic pain disorder   [place] [facility], Lauris Poag, MD   8 months ago Type 2 diabetes mellitus with other specified complication, with long-term current use of insulin (HCC)   [place] [facility] Lauris Poag, MD   1 year ago Uncontrolled type 2 diabetes mellitus with  hypoglycemia without coma [facility])   [place] [facility], [facility]   1 year ago Encounter to establish care   [facility], Carlos Pounds, NP       Future Appointments             In 5 days Carlos Cote, MD [facility] at [facility], [place]

## 2023-06-11 ENCOUNTER — Ambulatory Visit (INDEPENDENT_AMBULATORY_CARE_PROVIDER_SITE_OTHER): Payer: Medicaid Other | Admitting: Orthopaedic Surgery

## 2023-06-11 DIAGNOSIS — M545 Low back pain, unspecified: Secondary | ICD-10-CM | POA: Diagnosis not present

## 2023-06-11 DIAGNOSIS — T148XXA Other injury of unspecified body region, initial encounter: Secondary | ICD-10-CM | POA: Diagnosis not present

## 2023-06-11 DIAGNOSIS — R030 Elevated blood-pressure reading, without diagnosis of hypertension: Secondary | ICD-10-CM | POA: Diagnosis not present

## 2023-06-11 DIAGNOSIS — Z681 Body mass index (BMI) 19 or less, adult: Secondary | ICD-10-CM | POA: Diagnosis not present

## 2023-06-11 DIAGNOSIS — E109 Type 1 diabetes mellitus without complications: Secondary | ICD-10-CM | POA: Diagnosis not present
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
## 2023-06-12 DIAGNOSIS — G8929 Other chronic pain: Secondary | ICD-10-CM | POA: Diagnosis not present

## 2023-06-12 DIAGNOSIS — S14109S Unspecified injury at unspecified level of cervical spinal cord, sequela: Secondary | ICD-10-CM | POA: Diagnosis not present

## 2023-06-12 DIAGNOSIS — M5412 Radiculopathy, cervical region: Secondary | ICD-10-CM | POA: Diagnosis not present

## 2023-06-12 DIAGNOSIS — M961 Postlaminectomy syndrome, not elsewhere classified: Secondary | ICD-10-CM | POA: Diagnosis not present

## 2023-06-12 DIAGNOSIS — R252 Cramp and spasm: Secondary | ICD-10-CM | POA: Diagnosis not present

## 2023-06-12 DIAGNOSIS — M5416 Radiculopathy, lumbar region: Secondary | ICD-10-CM | POA: Diagnosis not present

## 2023-06-13 ENCOUNTER — Encounter: Payer: Self-pay | Admitting: Family Medicine

## 2023-06-13 ENCOUNTER — Other Ambulatory Visit: Payer: Self-pay | Admitting: Family Medicine

## 2023-06-13 MED ORDER — TAMSULOSIN HCL 0.4 MG PO CAPS: 0.4000 mg | ORAL_CAPSULE | Freq: Every day | ORAL | 2 refills | Status: DC

## 2023-06-13 MED ORDER — ATORVASTATIN CALCIUM 10 MG PO TABS: 10.0000 mg | ORAL_TABLET | Freq: Every day | ORAL | 3 refills | Status: DC

## 2023-06-13 MED ORDER — MIRTAZAPINE 15 MG PO TABS: ORAL_TABLET | ORAL | 1 refills | Status: DC

## 2023-06-13 NOTE — Telephone Encounter (Signed)
Please address patient concerns  

## 2023-06-14 ENCOUNTER — Encounter (HOSPITAL_COMMUNITY): Payer: Self-pay

## 2023-06-14 ENCOUNTER — Emergency Department (HOSPITAL_COMMUNITY)
Admission: EM | Admit: 2023-06-14 | Discharge: 2023-06-14 | Disposition: A | Discharge status: Home / Self Care | Payer: Medicaid Other | Attending: Emergency Medicine | Admitting: Emergency Medicine

## 2023-06-14 DIAGNOSIS — E119 Type 2 diabetes mellitus without complications: Secondary | ICD-10-CM | POA: Insufficient documentation

## 2023-06-14 DIAGNOSIS — M62838 Other muscle spasm: Secondary | ICD-10-CM | POA: Insufficient documentation

## 2023-06-14 DIAGNOSIS — Z7982 Long term (current) use of aspirin: Secondary | ICD-10-CM | POA: Diagnosis not present

## 2023-06-14 DIAGNOSIS — Z7984 Long term (current) use of oral hypoglycemic drugs: Secondary | ICD-10-CM | POA: Diagnosis not present

## 2023-06-14 DIAGNOSIS — Z794 Long term (current) use of insulin: Secondary | ICD-10-CM | POA: Diagnosis not present

## 2023-06-14 DIAGNOSIS — R252 Cramp and spasm: Secondary | ICD-10-CM | POA: Diagnosis present

## 2023-06-14 HISTORY — DX: Cramp and spasm: R25.2

## 2023-06-14 MED ORDER — OXYCODONE HCL 5 MG PO TABS
10.0000 mg | ORAL_TABLET | Freq: Once | ORAL | Status: AC
  Administered 2023-06-14: 10 mg via ORAL
  Filled 2023-06-14: qty 2

## 2023-06-14 MED ORDER — OXYCODONE-ACETAMINOPHEN 5-325 MG PO TABS: 1.0000 | ORAL_TABLET | Freq: Four times a day (QID) | ORAL | 0 refills | Status: DC | PRN

## 2023-06-14 MED ORDER — CYCLOBENZAPRINE HCL 10 MG PO TABS: 10.0000 mg | ORAL_TABLET | Freq: Two times a day (BID) | ORAL | 0 refills | Status: DC | PRN

## 2023-06-14 MED ORDER — CYCLOBENZAPRINE HCL 10 MG PO TABS
10.0000 mg | ORAL_TABLET | Freq: Once | ORAL | Status: AC
  Administered 2023-06-14: 10 mg via ORAL
  Filled 2023-06-14: qty 1
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
## 2023-06-14 NOTE — ED Triage Notes (Addendum)
Pt c/o generalized spasms x3 days.  Pain score 8/10.  Hx of Spasticity.  Pt reports he takes Baclofen for issue but it has not been working.

## 2023-06-14 NOTE — Discharge Instructions (Addendum)
Please take your medications as prescribed. Take tylenol/ibuprofen or Percocet as needed for pain. I recommend close follow-up with PCP and pain management for reevaluation and management.  Please do not hesitate to return to emergency department if worrisome signs symptoms we discussed become apparent.

## 2023-06-16 ENCOUNTER — Encounter: Payer: Self-pay | Admitting: Family Medicine

## 2023-06-18 ENCOUNTER — Encounter: Payer: Self-pay | Admitting: Family Medicine

## 2023-06-18 ENCOUNTER — Ambulatory Visit (INDEPENDENT_AMBULATORY_CARE_PROVIDER_SITE_OTHER): Payer: Medicaid Other | Admitting: Family Medicine

## 2023-06-18 VITALS — BP 105/72 | HR 85 | Temp 98.1°F | Resp 16 | Wt 164.6 lb

## 2023-06-18 DIAGNOSIS — R252 Cramp and spasm: Secondary | ICD-10-CM

## 2023-06-18 DIAGNOSIS — G894 Chronic pain syndrome: Secondary | ICD-10-CM

## 2023-06-18 MED ORDER — CYCLOBENZAPRINE HCL 10 MG PO TABS: 10.0000 mg | ORAL_TABLET | Freq: Two times a day (BID) | ORAL | 2 refills | Status: DC | PRN

## 2023-06-20 ENCOUNTER — Encounter: Payer: Self-pay | Admitting: Family Medicine

## 2023-06-21 DIAGNOSIS — R030 Elevated blood-pressure reading, without diagnosis of hypertension: Secondary | ICD-10-CM | POA: Diagnosis not present

## 2023-06-21 DIAGNOSIS — R5383 Other fatigue: Secondary | ICD-10-CM | POA: Diagnosis not present

## 2023-06-21 DIAGNOSIS — E109 Type 1 diabetes mellitus without complications: Secondary | ICD-10-CM | POA: Diagnosis not present

## 2023-06-21 DIAGNOSIS — M129 Arthropathy, unspecified: Secondary | ICD-10-CM | POA: Diagnosis not present

## 2023-06-21 DIAGNOSIS — E7800 Pure hypercholesterolemia, unspecified: Secondary | ICD-10-CM | POA: Diagnosis not present

## 2023-06-21 DIAGNOSIS — Z681 Body mass index (BMI) 19 or less, adult: Secondary | ICD-10-CM | POA: Diagnosis not present

## 2023-06-21 DIAGNOSIS — M545 Low back pain, unspecified: Secondary | ICD-10-CM | POA: Diagnosis not present

## 2023-06-21 DIAGNOSIS — E559 Vitamin D deficiency, unspecified: Secondary | ICD-10-CM | POA: Diagnosis not present

## 2023-06-21 DIAGNOSIS — Z79899 Other long term (current) drug therapy: Secondary | ICD-10-CM | POA: Diagnosis not present

## 2023-06-24 NOTE — Telephone Encounter (Signed)
Please advise patient.  

## 2023-06-25 ENCOUNTER — Encounter: Payer: Self-pay | Admitting: Family Medicine

## 2023-06-25 ENCOUNTER — Other Ambulatory Visit: Payer: Self-pay | Admitting: Family Medicine

## 2023-06-25 ENCOUNTER — Encounter (HOSPITAL_BASED_OUTPATIENT_CLINIC_OR_DEPARTMENT_OTHER): Payer: Self-pay | Admitting: Orthopaedic Surgery

## 2023-06-25 DIAGNOSIS — B351 Tinea unguium: Secondary | ICD-10-CM

## 2023-06-25 NOTE — Progress Notes (Signed)
Established Patient Office Visit  Subjective    Patient ID: Carlos [name]., male    DOB: [birth year]  Age: 52 y.o. MRN: [id number]  CC:  Chief Complaint  Patient presents with   Medication Refill    HPI Carlos [name]. presents for complaint of med refills. He does see pain management.   Outpatient Encounter Medications as of 06/18/2023  Medication Sig   acetaminophen (TYLENOL) 325 MG tablet Take 1-2 tablets (325-650 mg total) by mouth every 4 (four) hours as needed for mild pain.   aspirin EC 81 MG EC tablet Take 1 tablet (81 mg total) by mouth daily. Swallow whole.   atorvastatin (LIPITOR) 10 MG tablet Take 1 tablet (10 mg total) by mouth daily.   B Complex Vitamins (VITAMIN B COMPLEX) TABS Take1 tablet by mouth daily.   baclofen (LIORESAL) 20 MG tablet Take 2 tablets (40 mg total) by mouth 3 (three) times daily. For spasticity   buprenorphine (SUBUTEX) 2 MG SUBL SL tablet SMARTSIG:0.5 Tablet(s) Sublingual Every 4-6 Hours PRN   BUTRANS 20 MCG/HR PTWK 1 patch once a week.   Continuous Blood Gluc Sensor (FREESTYLE LIBRE 3 SENSOR) MISC Use as instructed to check blood sugars   docusate sodium (COLACE) 100 MG capsule Take 100 mg by mouth 2 (two) times daily.   doxycycline (VIBRAMYCIN) 100 MG capsule Take 1 capsule (100 mg total) by mouth 2 (two) times daily.   insulin glargine (LANTUS SOLOSTAR) 100 UNIT/ML Solostar Pen Inject 14 units daily   Insulin Pen Needle 31G X 5 MM MISC With pen   metFORMIN (GLUCOPHAGE) 1000 MG tablet Take 1 tablet (1,000 mg total) by mouth 2 (two) times daily with a meal.   mirtazapine (REMERON) 15 MG tablet TAKE 1 TABLET BY MOUTH DAILY AT  BEDTIME   Multiple Vitamin (MULTIVITAMIN WITH MINERALS) TABS tablet Take 1 tablet by mouth daily.   NEEDLE, DISP, 18 G (BD ECLIPSE SHIELDED NEEDLE) 18G X 1-1/2" MISC Use one needle every 2 weeks to draw the medication up into the syringe prior to injection.   NEEDLE, DISP, 21 G (BD ECLIPSE NEEDLE) 21G X 1-1/2" MISC  Use one needle every 2 weeks to inject testosterone into the muscle.   Oxycodone HCl 10 MG TABS Take 1 tablet (10 mg total) by mouth 4 (four) times daily as needed. Do not refill until 11/25/2022   oxyCODONE-acetaminophen (PERCOCET/ROXICET) 5-325 MG tablet Take 1 tablet by mouth every 6 (six) hours as needed for up to 12 doses for severe pain.   Syringe, Disposable, (2-3CC SYRINGE) 3 ML MISC Use one syringe every 2 weeks with testosterone injection.   tamsulosin (FLOMAX) 0.4 MG CAPS capsule Take 1 capsule (0.4 mg total) by mouth daily.   testosterone cypionate (DEPOTESTOSTERONE CYPIONATE) 200 MG/ML injection Inject 1 mL (200 mg total) into the muscle every 14 (fourteen) days.   [DISCONTINUED] cyclobenzaprine (FLEXERIL) 10 MG tablet Take 1 tablet (10 mg total) by mouth 2 (two) times daily as needed for muscle spasms.   cyclobenzaprine (FLEXERIL) 10 MG tablet Take 1 tablet (10 mg total) by mouth 2 (two) times daily as needed for muscle spasms.   naloxone (NARCAN) nasal spray 4 mg/0.1 mL SMARTSIG:1 Both Nares Daily   Semaglutide (RYBELSUS) 7 MG TABS TAKE 1 TABLET BY MOUTH DAILY BEFORE BREAKFAST. TAKE 30 MINUTES BEFORE BREAKFAST WITH 4 OZ. WATER Oral for 90 Days   Semaglutide,0.25 or 0.5MG /DOS, (OZEMPIC, 0.25 OR 0.5 MG/DOSE,) 2 MG/3ML SOPN Subcutaneous for 28 Days  terbinafine (LAMISIL) 250 MG tablet TAKE 1 TABLET BY MOUTH EVERY DAY Oral for 30 Days   traZODone (DESYREL) 50 MG tablet TAKE 1-2 TABLETS (50-100 MG TOTAL) BY MOUTH AT BEDTIME. FOR SLEEP Oral for 90 Days   No facility-administered encounter medications on file as of 06/18/2023.    Past Medical History:  Diagnosis Date   ADHD (attention deficit hyperactivity disorder)    Alcohol abuse    Depression    Diabetes mellitus without complication (HCC)    History of exercise stress test    03-18-2013--  normal   History of kidney stones    Hyperlipidemia    Kidney stones    Left ureteral stone    Spasticity    Type 2 diabetes mellitus  [facility]     Past Surgical History:  Procedure Laterality Date   ANTERIOR CERVICAL DECOMP/DISCECTOMY FUSION N/A 08/30/2021   Procedure: CERVICAL FIVE-SIX ANTERIOR CERVICAL DECOMPRESSION/DISCECTOMY FUSION;  Surgeon: Bedelia Person, MD;  Location: [facility] OR;  Service: Neurosurgery;  Laterality: N/A;   CARDIAC CATHETERIZATION  11-27-2006  dr Reyes Ivan   non-obstructive CAD/  20% proximal and mid LAD/  perserved LVF,  ef 55-60%   CYSTOSCOPY W/ RETROGRADES Left 05/17/2015   Procedure: CYSTOSCOPY WITH RETROGRADE PYELOGRAM;  Surgeon: Jerilee Field, MD;  Location: [facility];  Service: Urology;  Laterality: Left;   CYSTOSCOPY/URETEROSCOPY/HOLMIUM LASER/STENT PLACEMENT Left 05/17/2015   Procedure: LEFT URETEROSCOPY/HOLMIUM LASER/STENT PLACEMENT;  Surgeon: Jerilee Field, MD;  Location: [facility];  Service: Urology;  Laterality: Left;   EXTRACORPOREAL SHOCK WAVE LITHOTRIPSY Left 05-08-2015   EXTRACTION RIGHT MANDIBULAR , PREMOLAR/  MAXILLARY MANDIBULAR FIXATION WITH SCREWS  08-03-2008   LUMBAR PERCUTANEOUS PEDICLE SCREW 4 LEVEL N/A 09/04/2021   Procedure: Thoracic Four-Thoracic Eight  Percutaneous Instrumented Fusion;  Surgeon: Bedelia Person, MD;  Location: [facility] OR;  Service: Neurosurgery;  Laterality: N/A;   ORIF FOUR HOLD PLATE AND MAXILLOMANDIBULAR FIXATION W/ ARCH BARS  08-05-2008   REMOVAL ARCH BARS 09-15-2008   TRANSTHORACIC ECHOCARDIOGRAM  04-06-2008  dr Reyes Ivan   normal LVF,  ef 55-60%,  trivial MR and TR    Family History  Problem Relation Age of Onset   Cancer Mother    Diabetes Mother        type 1   Heart disease Mother        pacemaker   Cancer - Colon Mother    Alcohol abuse Father    Alcohol abuse Maternal Uncle    Diabetes Maternal Grandmother    Alcohol abuse Maternal Grandfather    Diabetes Maternal Grandfather    Prostate cancer Neg Hx     Social History   Socioeconomic History   Marital status: Married    Spouse name: Not on file    Number of children: Not on file   Years of education: Not on file   Highest education level: Not on file  Occupational History   Not on file  Tobacco Use   Smoking status: Never   Smokeless tobacco: Never  Vaping Use   Vaping Use: Never used  Substance and Sexual Activity   Alcohol use: Yes    Alcohol/week: 10.0 standard drinks of alcohol    Types: 10 Cans of beer per week   Drug use: No   Sexual activity: Not on file  Other Topics Concern   Not on file  Social History Narrative   ** Merged History Encounter **       Social Determinants of Health  Financial Resource Strain: Not on file  Food Insecurity: Not on file  Transportation Needs: Not on file  Physical Activity: Not on file  Stress: Not on file  Social Connections: Not on file  Intimate Partner Violence: Not on file    Review of Systems  All other systems reviewed and are negative.       Objective    BP 105/72   Pulse 85   Temp 98.1 F (36.7 C) (Oral)   Resp 16   Wt 164 lb 9.6 oz (74.7 kg)   SpO2 95%   BMI 20.04 kg/m   Physical Exam Vitals and nursing note reviewed.  Constitutional:      General: He is not in acute distress. Cardiovascular:     Rate and Rhythm: Normal rate and regular rhythm.  Pulmonary:     Effort: Pulmonary effort is normal.     Breath sounds: Normal breath sounds.  Abdominal:     Palpations: Abdomen is soft.     Tenderness: There is no abdominal tenderness.  Musculoskeletal:        General: Tenderness present. No deformity.  Neurological:     General: No focal deficit present.     Mental Status: He is alert and oriented to person, place, and time.         Assessment & Plan:   1. Spasticity Flexeril prescribed.   2. Chronic pain disorder Deferred prescribing tramadol  patient to confer with consultant.   No follow-ups on file.   Tommie Raymond, MD

## 2023-06-25 NOTE — Telephone Encounter (Signed)
LOV was 09/2022 regarding toe fungus. Patient request referral due to medication not working you prescribe

## 2023-06-26 ENCOUNTER — Ambulatory Visit (INDEPENDENT_AMBULATORY_CARE_PROVIDER_SITE_OTHER): Payer: Medicaid Other | Admitting: Student

## 2023-06-26 DIAGNOSIS — Z79899 Other long term (current) drug therapy: Secondary | ICD-10-CM | POA: Diagnosis not present

## 2023-06-26 DIAGNOSIS — M25521 Pain in right elbow: Secondary | ICD-10-CM | POA: Diagnosis not present

## 2023-06-26 DIAGNOSIS — T148XXA Other injury of unspecified body region, initial encounter: Secondary | ICD-10-CM

## 2023-06-26 MED ORDER — TRIAMCINOLONE ACETONIDE 40 MG/ML IJ SUSP
2.0000 mL | INTRAMUSCULAR | Status: AC | PRN
Start: 2023-06-26 — End: 2023-06-26
  Administered 2023-06-26: 2 mL via INTRA_ARTICULAR

## 2023-06-26 MED ORDER — LIDOCAINE HCL 1 % IJ SOLN
2.0000 mL | INTRAMUSCULAR | Status: AC | PRN
Start: 2023-06-26 — End: 2023-06-26
  Administered 2023-06-26: 2 mL

## 2023-06-26 NOTE — Progress Notes (Signed)
Chief Complaint: Right elbow pain    History of Present Illness:    Carlos [name]. is a 52 y.o. male presenting today for follow-up of his right elbow.  Reports that he hit his elbow about 2 months ago and has continued to have pain.  Pain levels are 7 out of 10 and have not changed since last visit.  He has been icing and performing stretches.  Denies any new changes in appearance or sensation at the elbow.    Surgical History:   None  PMH/PSH/Family History/Social History/Meds/Allergies:    Past Medical History:  Diagnosis Date   ADHD (attention deficit hyperactivity disorder)    Alcohol abuse    Depression    Diabetes mellitus without complication (HCC)    History of exercise stress test    03-18-2013--  normal   History of kidney stones    Hyperlipidemia    Kidney stones    Left ureteral stone    Spasticity    Type 2 diabetes mellitus [facility])    Past Surgical History:  Procedure Laterality Date   ANTERIOR CERVICAL DECOMP/DISCECTOMY FUSION N/A 08/30/2021   Procedure: CERVICAL FIVE-SIX ANTERIOR CERVICAL DECOMPRESSION/DISCECTOMY FUSION;  Surgeon: Bedelia Person, MD;  Location: [facility] OR;  Service: Neurosurgery;  Laterality: N/A;   CARDIAC CATHETERIZATION  11-27-2006  dr Reyes Ivan   non-obstructive CAD/  20% proximal and mid LAD/  perserved LVF,  ef 55-60%   CYSTOSCOPY W/ RETROGRADES Left 05/17/2015   Procedure: CYSTOSCOPY WITH RETROGRADE PYELOGRAM;  Surgeon: Jerilee Field, MD;  Location: [facility];  Service: Urology;  Laterality: Left;   CYSTOSCOPY/URETEROSCOPY/HOLMIUM LASER/STENT PLACEMENT Left 05/17/2015   Procedure: LEFT URETEROSCOPY/HOLMIUM LASER/STENT PLACEMENT;  Surgeon: Jerilee Field, MD;  Location[facility];  Service: Urology;  Laterality: Left;   EXTRACORPOREAL SHOCK WAVE LITHOTRIPSY Left 05-08-2015   EXTRACTION RIGHT MANDIBULAR , PREMOLAR/  MAXILLARY MANDIBULAR FIXATION WITH SCREWS  08-03-2008    LUMBAR PERCUTANEOUS PEDICLE SCREW 4 LEVEL N/A 09/04/2021   Procedure: Thoracic Four-Thoracic Eight  Percutaneous Instrumented Fusion;  Surgeon: Bedelia Person, MD;  Location: [facility] OR;  Service: Neurosurgery;  Laterality: N/A;   ORIF FOUR HOLD PLATE AND MAXILLOMANDIBULAR FIXATION W/ ARCH BARS  08-05-2008   REMOVAL ARCH BARS 09-15-2008   TRANSTHORACIC ECHOCARDIOGRAM  04-06-2008  dr Reyes Ivan   normal LVF,  ef 55-60%,  trivial MR and TR   Social History   Socioeconomic History   Marital status: Married    Spouse name: Not on file   Number of children: Not on file   Years of education: Not on file   Highest education level: Not on file  Occupational History   Not on file  Tobacco Use   Smoking status: Never   Smokeless tobacco: Never  Vaping Use   Vaping status: Never Used  Substance and Sexual Activity   Alcohol use: Yes    Alcohol/week: 10.0 standard drinks of alcohol    Types: 10 Cans of beer per week   Drug use: No   Sexual activity: Not on file  Other Topics Concern   Not on file  Social History Narrative   ** Merged History Encounter **       Social Determinants of Health   Financial Resource Strain: Not on file  Food Insecurity: Not on file  Transportation  Needs: Not on file  Physical Activity: Not on file  Stress: Not on file  Social Connections: Not on file   Family History  Problem Relation Age of Onset   Cancer Mother    Diabetes Mother        type 1   Heart disease Mother        pacemaker   Cancer - Colon Mother    Alcohol abuse Father    Alcohol abuse Maternal Uncle    Diabetes Maternal Grandmother    Alcohol abuse Maternal Grandfather    Diabetes Maternal Grandfather    Prostate cancer Neg Hx    No Known Allergies  Current Outpatient Medications  Medication Sig Dispense Refill   acetaminophen (TYLENOL) 325 MG tablet Take 1-2 tablets (325-650 mg total) by mouth every 4 (four) hours as needed for mild pain.     aspirin EC 81 MG EC tablet Take 1  tablet (81 mg total) by mouth daily. Swallow whole. 30 tablet 11   atorvastatin (LIPITOR) 10 MG tablet Take 1 tablet (10 mg total) by mouth daily. 90 tablet 3   B Complex Vitamins (VITAMIN B COMPLEX) TABS Take1 tablet by mouth daily. 30 tablet 0   baclofen (LIORESAL) 20 MG tablet Take 2 tablets (40 mg total) by mouth 3 (three) times daily. For spasticity 540 each 1   buprenorphine (SUBUTEX) 2 MG SUBL SL tablet SMARTSIG:0.5 Tablet(s) Sublingual Every 4-6 Hours PRN     BUTRANS 20 MCG/HR PTWK 1 patch once a week.     Continuous Blood Gluc Sensor (FREESTYLE LIBRE 3 SENSOR) MISC Use as instructed to check blood sugars 6 each 3   cyclobenzaprine (FLEXERIL) 10 MG tablet Take 1 tablet (10 mg total) by mouth 2 (two) times daily as needed for muscle spasms. 60 tablet 2   docusate sodium (COLACE) 100 MG capsule Take 100 mg by mouth 2 (two) times daily.     doxycycline (VIBRAMYCIN) 100 MG capsule Take 1 capsule (100 mg total) by mouth 2 (two) times daily. 14 capsule 0   insulin glargine (LANTUS SOLOSTAR) 100 UNIT/ML Solostar Pen Inject 14 units daily 15 mL 3   Insulin Pen Needle 31G X 5 MM MISC With pen 100 each 1   metFORMIN (GLUCOPHAGE) 1000 MG tablet Take 1 tablet (1,000 mg total) by mouth 2 (two) times daily with a meal. 180 tablet 1   mirtazapine (REMERON) 15 MG tablet TAKE 1 TABLET BY MOUTH DAILY AT  BEDTIME 90 tablet 1   Multiple Vitamin (MULTIVITAMIN WITH MINERALS) TABS tablet Take 1 tablet by mouth daily.     naloxone (NARCAN) nasal spray 4 mg/0.1 mL SMARTSIG:1 Both Nares Daily     NEEDLE, DISP, 18 G (BD ECLIPSE SHIELDED NEEDLE) 18G X 1-1/2" MISC Use one needle every 2 weeks to draw the medication up into the syringe prior to injection. 50 each 1   NEEDLE, DISP, 21 G (BD ECLIPSE NEEDLE) 21G X 1-1/2" MISC Use one needle every 2 weeks to inject testosterone into the muscle. 50 each 1   Oxycodone HCl 10 MG TABS Take 1 tablet (10 mg total) by mouth 4 (four) times daily as needed. Do not refill until  11/25/2022 120 tablet 0   oxyCODONE-acetaminophen (PERCOCET/ROXICET) 5-325 MG tablet Take 1 tablet by mouth every 6 (six) hours as needed for up to 12 doses for severe pain. 12 tablet 0   Semaglutide (RYBELSUS) 7 MG TABS TAKE 1 TABLET BY MOUTH DAILY BEFORE BREAKFAST. TAKE 30 MINUTES BEFORE BREAKFAST  WITH 4 OZ. WATER Oral for 90 Days     Semaglutide,0.25 or 0.5MG /DOS, (OZEMPIC, 0.25 OR 0.5 MG/DOSE,) 2 MG/3ML SOPN Subcutaneous for 28 Days     Syringe, Disposable, (2-3CC SYRINGE) 3 ML MISC Use one syringe every 2 weeks with testosterone injection. 25 each 2   tamsulosin (FLOMAX) 0.4 MG CAPS capsule Take 1 capsule (0.4 mg total) by mouth daily. 90 capsule 2   terbinafine (LAMISIL) 250 MG tablet TAKE 1 TABLET BY MOUTH EVERY DAY Oral for 30 Days     testosterone cypionate (DEPOTESTOSTERONE CYPIONATE) 200 MG/ML injection Inject 1 mL (200 mg total) into the muscle every 14 (fourteen) days. 10 mL 0   traZODone (DESYREL) 50 MG tablet TAKE 1-2 TABLETS (50-100 MG TOTAL) BY MOUTH AT BEDTIME. FOR SLEEP Oral for 90 Days     No current facility-administered medications for this visit.   No results found.  Review of Systems:   A ROS was performed including pertinent positives and negatives as documented in the HPI.  Physical Exam :   Constitutional: NAD and appears stated age Neurological: Alert and oriented Psych: Appropriate affect and cooperative There were no vitals taken for this visit.   Comprehensive Musculoskeletal Exam:    Medial epicondyle of the right elbow is significantly tender to palpation.  No overlying erythema or ecchymosis.  Full range of motion of the elbow from 0 to 120 degrees.  Imaging:     Assessment:   52 year old male with continued right elbow pain over the medial epicondyle.  He has not gotten any relief since last visit about 3 weeks ago and would like to proceed with medial epicondyle cortisone injection as recommended by Dr. Steward Drone.  This was performed today under  ultrasound guidance without any complication.  Recommend that he keep [place] updated on how much relief he gets with this and can have him return to clinic as needed to discuss further.  Plan :    -Ultrasound-guided cortisone injection performed of the right medial epicondyle today -Return to clinic as needed      Procedure Note  Patient: Issiah Huffaker.             Date of Birth: 16-Mar-1971           MRN: [id number]             Visit Date: 06/26/2023  Procedures: Visit Diagnoses:  1. Bone bruise     Medium Joint Inj: L elbow (Medial epicondyle) on 06/26/2023 12:26 PM Indications: pain Details: 22 G 1.5 in needle, ultrasound-guided medial approach Medications: 2 mL lidocaine 1 %; 2 mL triamcinolone acetonide 40 MG/ML Outcome: tolerated well, no immediate complications Consent was given by the patient. Immediately prior to procedure a time out was called to verify the correct patient, procedure, equipment, support staff and site/side marked as required. Patient was prepped and draped in the usual sterile fashion.      I personally saw and evaluated the patient, and participated in the management and treatment plan.  Hazle Nordmann, PA-C Orthopedics  This document was dictated using Conservation officer, historic buildings. A reasonable attempt at proof reading has been made to minimize errors.

## 2023-06-27 ENCOUNTER — Encounter: Payer: Self-pay | Admitting: Family Medicine

## 2023-06-27 ENCOUNTER — Other Ambulatory Visit: Payer: Self-pay | Admitting: Physician Assistant

## 2023-06-27 DIAGNOSIS — Z125 Encounter for screening for malignant neoplasm of prostate: Secondary | ICD-10-CM

## 2023-06-27 DIAGNOSIS — E291 Testicular hypofunction: Secondary | ICD-10-CM

## 2023-06-30 ENCOUNTER — Other Ambulatory Visit: Payer: Medicaid Other

## 2023-06-30 ENCOUNTER — Other Ambulatory Visit: Payer: Self-pay | Admitting: Family Medicine

## 2023-06-30 DIAGNOSIS — Z125 Encounter for screening for malignant neoplasm of prostate: Secondary | ICD-10-CM

## 2023-06-30 DIAGNOSIS — E291 Testicular hypofunction: Secondary | ICD-10-CM

## 2023-06-30 NOTE — Telephone Encounter (Signed)
Patient request for medication

## 2023-06-30 NOTE — Telephone Encounter (Signed)
Medication Refill - Medication: traZODone (DESYREL) 50 MG tablet (Completely out, wanting to start this again as he is having trouble sleeping)  Has the patient contacted their pharmacy? No.   Preferred Pharmacy (with phone number or street name):  [id number] - [place], [place] - [address] Phone: [phone]  Fax: [phone]     Has the patient been seen for an appointment in the last year OR does the patient have an upcoming appointment? Yes.  Last seen on 7.3.24  Patients call back #: [phone]

## 2023-07-01 NOTE — Telephone Encounter (Signed)
Requested medication (s) are due for refill today: Amount not specified  Requested medication (s) are on the active medication list: yes    Last refill: 06/18/23  Amount not specified  Future visit scheduled no   Notes to clinic:Historical provider, please review. Thank you.  Requested Prescriptions  Pending Prescriptions Disp Refills   traZODone (DESYREL) 50 MG tablet       Psychiatry: Antidepressants - Serotonin Modulator Passed - 06/30/2023 11:48 AM      Passed - Completed PHQ-2 or PHQ-9 in the last 360 days      Passed - Valid encounter within last 6 months    Recent Outpatient Visits           1 week ago Spasticity   [facility], MD   3 months ago Sexual dysfunction   [facility], MD   4 months ago Chronic pain disorder   [facility] [facility], Lauris Poag, MD   9 months ago Type 2 diabetes mellitus with other specified complication, with long-term current use of insulin [facility])   [place] [facility], Lauris Poag, MD   1 year ago Uncontrolled type 2 diabetes mellitus with hypoglycemia without coma [facility])   [place] [facility], MD       Future Appointments             In 1 week Allena Katz Gillie Manners, DPM [facility] at [facility], [place]

## 2023-07-02 ENCOUNTER — Encounter: Payer: Self-pay | Admitting: Endocrinology

## 2023-07-02 LAB — PSA: Prostate Specific Ag, Serum: 0.2 ng/mL (ref 0.0–4.0)

## 2023-07-02 LAB — HEMOGLOBIN AND HEMATOCRIT, BLOOD
Hematocrit: 38.4 % (ref 37.5–51.0)
Hemoglobin: 12.6 g/dL — ABNORMAL LOW (ref 13.0–17.7)

## 2023-07-02 LAB — TESTOSTERONE: Testosterone: 44 ng/dL — ABNORMAL LOW (ref 264–916)

## 2023-07-02 NOTE — Telephone Encounter (Signed)
Pt states that his last testosterone injection was "Thursday before last, this Thursday its due again"

## 2023-07-03 ENCOUNTER — Other Ambulatory Visit: Payer: Self-pay | Admitting: Family Medicine

## 2023-07-03 MED ORDER — TRAZODONE HCL 50 MG PO TABS: ORAL_TABLET | ORAL | 1 refills | Status: DC

## 2023-07-03 NOTE — Telephone Encounter (Signed)
Pt informed and scheduled. Pt was asking why he has to repeat his labs since he just got them done on 07/15.

## 2023-07-04 ENCOUNTER — Encounter: Payer: Self-pay | Admitting: Endocrinology

## 2023-07-04 ENCOUNTER — Ambulatory Visit: Payer: Medicaid Other | Admitting: Endocrinology

## 2023-07-04 VITALS — BP 110/68 | HR 81 | Ht 76.0 in | Wt 155.0 lb

## 2023-07-04 DIAGNOSIS — Z794 Long term (current) use of insulin: Secondary | ICD-10-CM | POA: Diagnosis not present

## 2023-07-04 DIAGNOSIS — E1165 Type 2 diabetes mellitus with hyperglycemia: Secondary | ICD-10-CM | POA: Diagnosis not present

## 2023-07-04 NOTE — Patient Instructions (Addendum)
Lantus 14 units in am, increase weekly till pre dinner sugar about 130  Lantus 12 units in pm, increase weekly till fasting sugar about 130  Take 3-4 Lyumjev BEFORE each meal and upto 7  for large meals  Avoid sugar drinks and hi fat meals

## 2023-07-04 NOTE — Progress Notes (Unsigned)
Patient ID: Carlos Cordia., male   DOB: 12-Sep-1971, 52 y.o.   MRN: [id number]            Reason for Appointment: Type II Diabetes follow-up   History of Present Illness   Diagnosis date: 2014  Previous history:  Oral hypoglycemic drugs previously used are: Amaryl, glipizide, metformin He had been taking Jardiance more regularly since about 12/2020 This was stopped in 6/23 because of inadequate control especially postprandial and A1c 7.5  A1c range in the last few years is: 6.4-8.4  Recent history:     Non-insulin hypoglycemic drugs: metformin 1 g twice daily      INSULIN doses: Lantus 10 units in the morning and 8 in the evening, Lyumjev 4 units as needed   Side effects from medications: None  Current self management, blood sugar patterns and problems identified:  A1c is 6.8 in 5/24 Recent GMI 7.7 compared to 6.9 He came in early for his follow-up because of increasing blood sugars along with loss of weight He was told today Lyumjev and increase the dose for mealtime coverage but he misunderstood He is currently taking Lyumjev only as needed when the blood sugar goes up roughly about 200 However his overnight and Premeal blood sugars are significantly higher than before and averaging at least 50 mg higher than before He has not increased his Lantus He is still is relatively active Last time was told to cut back on fast food and desserts Likely not taking Lyumjev daily since blood sugars are somewhat fluctuating except mostly higher after breakfast and dinner Still on metformin  Exercise: walking during the day especially with work  Analysis of freestyle libre 3 download for the last 14 days as follows  Overall variability is somewhat more compared to last visit Overnight blood sugars are generally within the target range and the lowest around 2-4 AM without hypoglycemia but occasionally low normal readings HYPERGLYCEMIC episodes are seen sporadically on some days  either after lunch or dinner and occasionally after breakfast with highest readings over 300 Premeal blood sugars are gradually rising between morning and evening  On an average blood sugars are the highest after dinner around 9 PM    CONTINUOUS GLUCOSE MONITORING RECORD INTERPRETATION    Dates of Recording: 2 weeks through July 19  Sensor description: Libre 3  Results statistics:   CGM use % of time 96  Average and SD 184 GV 25  Time in range        44, was 81%  % Time Above 180 49  % Time above 250 7  % Time Below target     PRE-MEAL Fasting Lunch Dinner Bedtime Overall  Glucose range:       Mean/median: 177       POST-MEAL PC Breakfast PC Lunch PC Dinner  Glucose range:     Mean/median: 207 171 193    Glycemic patterns summary: Blood sugars are overall consistently high at all times with the highest readings are on 11 PM and lower things around 4 PM  Hyperglycemic episodes these are seen at all times of the day and night with the highest readings after meals and bedtime  Hypoglycemic episodes not present, rarely low normal readings around 9 PM  Overnight periods: Blood sugars are somewhat variable but usually averaging about 200 at midnight and somewhat better after 2 AM but still consistently high  Preprandial periods: Lowest readings are late afternoon around 170 but higher otherwise  Postprandial periods:  After breakfast:   Variable increased but as much as 280 After lunch:   Blood sugars are generally flat to lower After dinner: Blood sugars are rising modestly but rarely over 250  Previously:  CGM use % of time   2-week average/GV 150/33  Time in range 81  % Time Above 180 14+5  % Time above 250   % Time Below 70 0     PRE-MEAL Fasting Lunch Dinner Bedtime Overall  Glucose range:       Averages: 126  162     POST-MEAL PC Breakfast PC Lunch PC Dinner  Glucose range:     Averages: 153 167 191               Dietician visit: Most recent:  Unknown     Weight control:  Wt Readings from Last 3 Encounters:  07/04/23 155 lb (70.3 kg)  06/18/23 164 lb 9.6 oz (74.7 kg)  05/29/23 165 lb (74.8 kg)            Diabetes labs:  Lab Results  Component Value Date   HGBA1C 6.8 (H) 04/21/2023   HGBA1C 7.0 (A) 01/29/2023   HGBA1C 8.1 (H) 10/09/2022   Lab Results  Component Value Date   MICROALBUR <0.7 06/03/2022   LDLCALC 62 04/21/2023   CREATININE 0.78 04/21/2023     Allergies as of 07/04/2023   No Known Allergies      Medication List        Accurate as of July 04, 2023 11:59 PM. If you have any questions, ask your nurse or doctor.          STOP taking these medications    doxycycline 100 MG capsule Commonly known as: VIBRAMYCIN Stopped by: Reather Littler   Ozempic (0.25 or 0.5 MG/DOSE) 2 MG/3ML Sopn Generic drug: Semaglutide(0.25 or 0.5MG /DOS) Stopped by: Reather Littler   Rybelsus 7 MG Tabs Generic drug: Semaglutide Stopped by: Reather Littler       TAKE these medications    2-3CC SYRINGE 3 ML Misc Use one syringe every 2 weeks with testosterone injection.   acetaminophen 325 MG tablet Commonly known as: TYLENOL Take 1-2 tablets (325-650 mg total) by mouth every 4 (four) hours as needed for mild pain.   aspirin EC 81 MG tablet Take 1 tablet (81 mg total) by mouth daily. Swallow whole.   atorvastatin 10 MG tablet Commonly known as: LIPITOR Take 1 tablet (10 mg total) by mouth daily.   baclofen 20 MG tablet Commonly known as: LIORESAL Take 2 tablets (40 mg total) by mouth 3 (three) times daily. For spasticity   BD Eclipse Needle 21G X 1-1/2" Misc Generic drug: NEEDLE (DISP) 21 G Use one needle every 2 weeks to inject testosterone into the muscle.   BD Eclipse Shielded Needle 18G X 1-1/2" Misc Generic drug: NEEDLE (DISP) 18 G Use one needle every 2 weeks to draw the medication up into the syringe prior to injection.   buprenorphine 2 MG Subl SL tablet Commonly known as: SUBUTEX SMARTSIG:0.5  Tablet(s) Sublingual Every 4-6 Hours PRN   Butrans 20 MCG/HR Ptwk Generic drug: buprenorphine 1 patch once a week.   cyclobenzaprine 10 MG tablet Commonly known as: FLEXERIL Take 1 tablet (10 mg total) by mouth 2 (two) times daily as needed for muscle spasms.   docusate sodium 100 MG capsule Commonly known as: COLACE Take 100 mg by mouth 2 (two) times daily.   FreeStyle Libre 3 Sensor Misc Use as instructed to check  blood sugars   Insulin Pen Needle 31G X 5 MM Misc With pen   Lantus SoloStar 100 UNIT/ML Solostar Pen Generic drug: insulin glargine Inject 14 units daily What changed: additional instructions   metFORMIN 1000 MG tablet Commonly known as: GLUCOPHAGE Take 1 tablet (1,000 mg total) by mouth 2 (two) times daily with a meal.   mirtazapine 15 MG tablet Commonly known as: REMERON TAKE 1 TABLET BY MOUTH DAILY AT  BEDTIME   multivitamin with minerals Tabs tablet Take 1 tablet by mouth daily.   naloxone 4 MG/0.1ML Liqd nasal spray kit Commonly known as: NARCAN SMARTSIG:1 Both Nares Daily   Oxycodone HCl 10 MG Tabs Take 1 tablet (10 mg total) by mouth 4 (four) times daily as needed. Do not refill until 11/25/2022   oxyCODONE-acetaminophen 10-325 MG tablet Commonly known as: PERCOCET Take 1 tablet by mouth 4 (four) times daily as needed.   oxyCODONE-acetaminophen 5-325 MG tablet Commonly known as: PERCOCET/ROXICET Take 1 tablet by mouth every 6 (six) hours as needed for up to 12 doses for severe pain.   tamsulosin 0.4 MG Caps capsule Commonly known as: FLOMAX Take 1 capsule (0.4 mg total) by mouth daily.   terbinafine 250 MG tablet Commonly known as: LAMISIL TAKE 1 TABLET BY MOUTH EVERY DAY Oral for 30 Days   testosterone cypionate 200 MG/ML injection Commonly known as: DEPOTESTOSTERONE CYPIONATE Inject 1 mL (200 mg total) into the muscle every 14 (fourteen) days.   traZODone 50 MG tablet Commonly known as: DESYREL TAKE 1-2 TABLETS (50-100 MG TOTAL)  BY MOUTH AT BEDTIME. FOR SLEEP Oral for 90 Days   Vitamin B Complex Tabs Take1 tablet by mouth daily.        Allergies:  No Known Allergies   Past Medical History:  Diagnosis Date   ADHD (attention deficit hyperactivity disorder)    Alcohol abuse    Depression    Diabetes mellitus without complication (HCC)    History of exercise stress test    03-18-2013--  normal   History of kidney stones    Hyperlipidemia    Kidney stones    Left ureteral stone    Spasticity    Type 2 diabetes mellitus [facility])     Past Surgical History:  Procedure Laterality Date   ANTERIOR CERVICAL DECOMP/DISCECTOMY FUSION N/A 08/30/2021   Procedure: CERVICAL FIVE-SIX ANTERIOR CERVICAL DECOMPRESSION/DISCECTOMY FUSION;  Surgeon: Bedelia Person, MD;  Location: [facility] OR;  Service: Neurosurgery;  Laterality: N/A;   CARDIAC CATHETERIZATION  11-27-2006  dr Reyes Ivan   non-obstructive CAD/  20% proximal and mid LAD/  perserved LVF,  ef 55-60%   CYSTOSCOPY W/ RETROGRADES Left 05/17/2015   Procedure: CYSTOSCOPY WITH RETROGRADE PYELOGRAM;  Surgeon: Jerilee Field, MD;  Location: [facility];  Service: Urology;  Laterality: Left;   CYSTOSCOPY/URETEROSCOPY/HOLMIUM LASER/STENT PLACEMENT Left 05/17/2015   Procedure: LEFT URETEROSCOPY/HOLMIUM LASER/STENT PLACEMENT;  Surgeon: Jerilee Field, MD;  Location: [facility];  Service: Urology;  Laterality: Left;   EXTRACORPOREAL SHOCK WAVE LITHOTRIPSY Left 05-08-2015   EXTRACTION RIGHT MANDIBULAR , PREMOLAR/  MAXILLARY MANDIBULAR FIXATION WITH SCREWS  08-03-2008   LUMBAR PERCUTANEOUS PEDICLE SCREW 4 LEVEL N/A 09/04/2021   Procedure: Thoracic Four-Thoracic Eight  Percutaneous Instrumented Fusion;  Surgeon: Bedelia Person, MD;  Location: [facility] OR;  Service: Neurosurgery;  Laterality: N/A;   ORIF FOUR HOLD PLATE AND MAXILLOMANDIBULAR FIXATION W/ ARCH BARS  08-05-2008   REMOVAL ARCH BARS 09-15-2008   TRANSTHORACIC ECHOCARDIOGRAM  04-06-2008  dr Reyes Ivan  normal LVF,  ef 55-60%,  trivial MR and TR    Family History  Problem Relation Age of Onset   Cancer Mother    Diabetes Mother        type 1   Heart disease Mother        pacemaker   Cancer - Colon Mother    Alcohol abuse Father    Alcohol abuse Maternal Uncle    Diabetes Maternal Grandmother    Alcohol abuse Maternal Grandfather    Diabetes Maternal Grandfather    Prostate cancer Neg Hx     Social History:  reports that he has never smoked. He has never used smokeless tobacco. He reports current alcohol use of about 10.0 standard drinks of alcohol per week. He reports that he does not use drugs.  Review of Systems:  Last diabetic eye exam date unknown  Last foot exam date: 11/22  Symptoms of neuropathy: None  Hypertension: Not present  BP Readings from Last 3 Encounters:  07/04/23 110/68  06/18/23 105/72  06/14/23 (!) 134/95    Lipids: Has had mixed hyperlipidemia From PCP was only on gemfibrozil 600 mg twice daily Lipitor was restarted recently, LDL was higher at 150 previously in 10/23 He is tolerating this well and LDL is below 70 now Also triglycerides are excellent    Lab Results  Component Value Date   CHOL 125 04/21/2023   CHOL 209 (H) 10/09/2022   CHOL 212 (H) 07/12/2021   Lab Results  Component Value Date   HDL 36.70 (L) 04/21/2023   HDL 44.90 10/09/2022   HDL 56 07/12/2021   Lab Results  Component Value Date   LDLCALC 62 04/21/2023   LDLCALC 150 (H) 10/09/2022   LDLCALC 114 (H) 07/12/2021   Lab Results  Component Value Date   TRIG 135.0 04/21/2023   TRIG 68.0 10/09/2022   TRIG 315 (H) 07/12/2021   Lab Results  Component Value Date   CHOLHDL 3 04/21/2023   CHOLHDL 5 10/09/2022   CHOLHDL 3.8 07/12/2021   Lab Results  Component Value Date   LDLDIRECT 130.0 08/26/2017   LDLDIRECT 65.0 03/21/2015   LDLDIRECT 84.6 07/22/2014      Examination:   BP 110/68 (BP Location: Left Arm, Patient Position: Sitting, Cuff Size: Small)    Pulse 81   Ht 6\' 4"  (1.93 m)   Wt 155 lb (70.3 kg)   SpO2 97%   BMI 18.87 kg/m   Body mass index is 18.87 kg/m.    ASSESSMENT/ PLAN:    Diabetes type 2:   Current regimen: Lantus twice a day total 18 units, Lyumjev 4 units as needed and metformin 1 g twice daily  A1c is last 6.8 but recent GMI 7.7   Although he had fairly good control with adding basal insulin on the last visit his blood sugars are significantly higher throughout the day including overnight along with some postprandial spikes He had not done adequately well with previous regimen of Jardiance and Ozempic caused some weight loss Currently is not taking Lyumjev as directed and only taking as needed rather than proactively before meals Discussed that he is getting more insulin deficient Likely needs progressively higher doses of basal insulin until he can get his fasting and predinner blood sugars down to at least under 140 Today discussed in detail the need for consistent mealtime insulin to cover postprandial spikes, action of mealtime insulin, timing and action of the rapid acting insulin as well as  dosage titration to  target the two-hour reading of under 180 Continue using the libre consistently  Patient Instructions   Lantus 14 units in am, increase weekly till pre dinner sugar about 130  Lantus 12 units in pm, increase weekly till fasting sugar about 130  Take 3-4 Lyumjev BEFORE each meal and upto 7  for large meals  Avoid sugar drinks and hi fat meals   Patient Instructions  Lantus 14 units in am, increase weekly till pre dinner sugar about 130  Lantus 12 units in pm, increase weekly till fasting sugar about 130  Take 3-4 Lyumjev BEFORE each meal and upto 7  for large meals  Avoid sugar drinks and hi fat meals     Reather Littler 07/05/2023, 12:43 PM

## 2023-07-07 ENCOUNTER — Encounter: Payer: Self-pay | Admitting: Endocrinology

## 2023-07-08 ENCOUNTER — Encounter: Payer: Self-pay | Admitting: Endocrinology

## 2023-07-09 ENCOUNTER — Other Ambulatory Visit: Payer: Self-pay

## 2023-07-09 DIAGNOSIS — E291 Testicular hypofunction: Secondary | ICD-10-CM

## 2023-07-09 DIAGNOSIS — E11649 Type 2 diabetes mellitus with hypoglycemia without coma: Secondary | ICD-10-CM

## 2023-07-09 MED ORDER — LYUMJEV KWIKPEN 100 UNIT/ML ~~LOC~~ SOPN
4.0000 [IU] | PEN_INJECTOR | Freq: Every day | SUBCUTANEOUS | 0 refills | Status: DC
Start: 2023-07-09 — End: 2023-07-15

## 2023-07-09 MED ORDER — INSULIN PEN NEEDLE 31G X 5 MM MISC
1 refills | Status: DC
Start: 2023-07-09 — End: 2024-09-08

## 2023-07-10 ENCOUNTER — Other Ambulatory Visit: Payer: Medicaid Other

## 2023-07-10 DIAGNOSIS — E291 Testicular hypofunction: Secondary | ICD-10-CM | POA: Diagnosis not present

## 2023-07-10 NOTE — Telephone Encounter (Signed)
Called and left a VM with the pharmacy to see if a PA was needed and to see if it was for the Lyumjev.

## 2023-07-11 ENCOUNTER — Ambulatory Visit: Payer: Medicaid Other | Admitting: Podiatry

## 2023-07-11 DIAGNOSIS — E1165 Type 2 diabetes mellitus with hyperglycemia: Secondary | ICD-10-CM

## 2023-07-11 DIAGNOSIS — E119 Type 2 diabetes mellitus without complications: Secondary | ICD-10-CM
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
## 2023-07-13 ENCOUNTER — Encounter: Payer: Self-pay | Admitting: Endocrinology

## 2023-07-14 ENCOUNTER — Telehealth: Payer: Self-pay

## 2023-07-14 ENCOUNTER — Telehealth: Payer: Self-pay | Admitting: Pharmacy Technician

## 2023-07-14 ENCOUNTER — Encounter: Payer: Self-pay | Admitting: Family Medicine

## 2023-07-14 ENCOUNTER — Other Ambulatory Visit (HOSPITAL_COMMUNITY): Payer: Self-pay

## 2023-07-14 NOTE — Telephone Encounter (Signed)
Message has been sent to the PA team.

## 2023-07-14 NOTE — Telephone Encounter (Signed)
New Encounter created for follow up. For additional info see Pharmacy Prior Auth telephone encounter from 07/14/23.

## 2023-07-14 NOTE — Telephone Encounter (Signed)
Pharmacy Patient Advocate Encounter   Received notification from Pt Calls Messages that prior authorization for Lyumjev is required/requested.   Insurance verification completed.   The patient is insured through [facility] .   Per test claim:  Humalog or Novolog is preferred by the ins.  If suggested medication is appropriate, Please send in a new RX and discontinue this one. If not, please advise as to why it's not appropriate so that we may request a Prior Authorization.  He'll have a $4 copay with these.

## 2023-07-14 NOTE — Telephone Encounter (Signed)
Pt needs PA for [employer]

## 2023-07-14 NOTE — Telephone Encounter (Signed)
Dr Lucianne Muss Please Advise? Pharmacy Patient Advocate Encounter   Received notification from Pt Calls Messages that prior authorization for Lyumjev is required/requested.   Insurance verification completed.   The patient is insured through [facility] .   Per test claim:  Humalog or Novolog is preferred by the ins.  If suggested medication is appropriate, Please send in a new RX and discontinue this one. If not, please advise as to why it's not appropriate so that we may request a Prior Authorization.   He'll have a $4 copay with these.

## 2023-07-15 ENCOUNTER — Other Ambulatory Visit: Payer: Self-pay | Admitting: "Endocrinology

## 2023-07-15 MED ORDER — INSULIN LISPRO (1 UNIT DIAL) 100 UNIT/ML (KWIKPEN): PEN_INJECTOR | SUBCUTANEOUS | 2 refills | Status: DC

## 2023-07-15 NOTE — Progress Notes (Signed)
Humalog 

## 2023-07-15 NOTE — Telephone Encounter (Signed)
This message has been sent to pcp for response. Kieth Brightly

## 2023-07-15 NOTE — Telephone Encounter (Signed)
Please advise on Alternative in Dr.Gherghe absence.   Dicie Beam

## 2023-07-16 NOTE — Telephone Encounter (Signed)
Humalog was sent in to replace the [facility]

## 2023-07-17 ENCOUNTER — Other Ambulatory Visit: Payer: Self-pay | Admitting: Family Medicine

## 2023-07-17 DIAGNOSIS — M545 Low back pain, unspecified: Secondary | ICD-10-CM | POA: Diagnosis not present

## 2023-07-17 DIAGNOSIS — M542 Cervicalgia: Secondary | ICD-10-CM | POA: Diagnosis not present

## 2023-07-17 DIAGNOSIS — R252 Cramp and spasm: Secondary | ICD-10-CM | POA: Diagnosis not present

## 2023-07-17 DIAGNOSIS — M5412 Radiculopathy, cervical region: Secondary | ICD-10-CM | POA: Diagnosis not present

## 2023-07-17 DIAGNOSIS — Z79891 Long term (current) use of opiate analgesic: Secondary | ICD-10-CM | POA: Diagnosis not present

## 2023-07-17 DIAGNOSIS — M961 Postlaminectomy syndrome, not elsewhere classified: Secondary | ICD-10-CM | POA: Diagnosis not present

## 2023-07-17 DIAGNOSIS — G8929 Other chronic pain: Secondary | ICD-10-CM | POA: Diagnosis not present

## 2023-07-17 DIAGNOSIS — S14109S Unspecified injury at unspecified level of cervical spinal cord, sequela: Secondary | ICD-10-CM | POA: Diagnosis not present

## 2023-07-17 DIAGNOSIS — M546 Pain in thoracic spine: Secondary | ICD-10-CM | POA: Diagnosis not present

## 2023-07-17 MED ORDER — PREGABALIN 50 MG PO CAPS: 50.0000 mg | ORAL_CAPSULE | Freq: Two times a day (BID) | ORAL | 0 refills | Status: DC

## 2023-07-22 ENCOUNTER — Encounter: Payer: Self-pay | Admitting: Urology

## 2023-07-22 DIAGNOSIS — E291 Testicular hypofunction: Secondary | ICD-10-CM

## 2023-07-24 MED ORDER — "BD ECLIPSE NEEDLE 21G X 1-1/2"" MISC": 1 refills | Status: DC

## 2023-07-24 MED ORDER — "BD ECLIPSE SHIELDED NEEDLE 18G X 1-1/2"" MISC": 1 refills | Status: DC

## 2023-07-24 NOTE — Telephone Encounter (Signed)
Pharmacy states they do have enough testosterone, it was just too early for him to pick up the refill. He would have to go on the 13th for it. Pharmacy states they need a refill for the 18 and 21 gauge needles.

## 2023-07-24 NOTE — Telephone Encounter (Signed)
Pt informed, voiced understanding

## 2023-07-24 NOTE — Telephone Encounter (Signed)
Needles refilled. Please let him know I can't send in 90 day supplies of the testosterone since it's a controlled substance.

## 2023-07-28 NOTE — Telephone Encounter (Signed)
LVM for pt to return call. Sam send in Rx for 18 and 21 gauge needles to pharm.

## 2023-07-30 ENCOUNTER — Encounter: Payer: Self-pay | Admitting: Family Medicine

## 2023-08-01 ENCOUNTER — Encounter: Payer: Self-pay | Admitting: Endocrinology

## 2023-08-01 ENCOUNTER — Other Ambulatory Visit: Payer: Self-pay

## 2023-08-01 DIAGNOSIS — E1165 Type 2 diabetes mellitus with hyperglycemia: Secondary | ICD-10-CM

## 2023-08-01 MED ORDER — FREESTYLE LIBRE 3 SENSOR MISC
3 refills | Status: DC
Start: 2023-08-01 — End: 2023-10-22

## 2023-08-11 ENCOUNTER — Encounter: Payer: Self-pay | Admitting: Family Medicine

## 2023-08-11 ENCOUNTER — Other Ambulatory Visit: Payer: Self-pay | Admitting: Family Medicine

## 2023-08-11 ENCOUNTER — Other Ambulatory Visit: Payer: Self-pay | Admitting: Endocrinology

## 2023-08-11 DIAGNOSIS — E11649 Type 2 diabetes mellitus with hypoglycemia without coma: Secondary | ICD-10-CM

## 2023-08-11 MED ORDER — PREGABALIN 50 MG PO CAPS: 50.0000 mg | ORAL_CAPSULE | Freq: Two times a day (BID) | ORAL | 0 refills | Status: DC

## 2023-08-15 ENCOUNTER — Other Ambulatory Visit: Payer: Medicaid Other

## 2023-08-19 ENCOUNTER — Other Ambulatory Visit: Payer: Self-pay | Admitting: Nurse Practitioner

## 2023-08-19 ENCOUNTER — Ambulatory Visit
Admission: RE | Admit: 2023-08-19 | Discharge: 2023-08-19 | Disposition: A | Discharge status: Home / Self Care | Payer: Medicaid Other | Source: Ambulatory Visit | Attending: Nurse Practitioner | Admitting: Nurse Practitioner

## 2023-08-19 DIAGNOSIS — M546 Pain in thoracic spine: Secondary | ICD-10-CM

## 2023-08-19 DIAGNOSIS — M4316 Spondylolisthesis, lumbar region: Secondary | ICD-10-CM | POA: Diagnosis not present

## 2023-08-19 DIAGNOSIS — M545 Low back pain, unspecified: Secondary | ICD-10-CM

## 2023-08-19 DIAGNOSIS — Z981 Arthrodesis status: Secondary | ICD-10-CM | POA: Diagnosis not present

## 2023-08-19 DIAGNOSIS — M542 Cervicalgia: Secondary | ICD-10-CM

## 2023-08-21 ENCOUNTER — Encounter: Payer: Self-pay | Admitting: Endocrinology

## 2023-08-21 ENCOUNTER — Ambulatory Visit: Payer: Medicaid Other | Admitting: Endocrinology

## 2023-08-21 VITALS — BP 130/90 | HR 105 | Ht 76.0 in | Wt 156.8 lb

## 2023-08-21 DIAGNOSIS — M546 Pain in thoracic spine: Secondary | ICD-10-CM | POA: Diagnosis not present

## 2023-08-21 DIAGNOSIS — Z79891 Long term (current) use of opiate analgesic: Secondary | ICD-10-CM | POA: Diagnosis not present

## 2023-08-21 DIAGNOSIS — Z7984 Long term (current) use of oral hypoglycemic drugs: Secondary | ICD-10-CM | POA: Diagnosis not present

## 2023-08-21 DIAGNOSIS — M5412 Radiculopathy, cervical region: Secondary | ICD-10-CM | POA: Diagnosis not present

## 2023-08-21 DIAGNOSIS — Z794 Long term (current) use of insulin: Secondary | ICD-10-CM

## 2023-08-21 DIAGNOSIS — G8929 Other chronic pain: Secondary | ICD-10-CM | POA: Diagnosis not present

## 2023-08-21 DIAGNOSIS — M542 Cervicalgia: Secondary | ICD-10-CM | POA: Diagnosis not present

## 2023-08-21 DIAGNOSIS — M961 Postlaminectomy syndrome, not elsewhere classified: Secondary | ICD-10-CM | POA: Diagnosis not present

## 2023-08-21 DIAGNOSIS — E162 Hypoglycemia, unspecified: Secondary | ICD-10-CM

## 2023-08-21 DIAGNOSIS — R252 Cramp and spasm: Secondary | ICD-10-CM | POA: Diagnosis not present

## 2023-08-21 DIAGNOSIS — E1165 Type 2 diabetes mellitus with hyperglycemia: Secondary | ICD-10-CM

## 2023-08-21 DIAGNOSIS — E11649 Type 2 diabetes mellitus with hypoglycemia without coma: Secondary | ICD-10-CM

## 2023-08-21 DIAGNOSIS — M545 Low back pain, unspecified: Secondary | ICD-10-CM | POA: Diagnosis not present

## 2023-08-21 DIAGNOSIS — S14109S Unspecified injury at unspecified level of cervical spinal cord, sequela: Secondary | ICD-10-CM | POA: Diagnosis not present

## 2023-08-21 LAB — GLUCOSE, RANDOM: Glucose, Bld: 115 mg/dL — ABNORMAL HIGH (ref 70–99)

## 2023-08-21 LAB — POCT GLYCOSYLATED HEMOGLOBIN (HGB A1C): Hemoglobin A1C: 6.7 % — AB (ref 4.0–5.6)

## 2023-08-21 MED ORDER — INSULIN LISPRO (1 UNIT DIAL) 100 UNIT/ML (KWIKPEN): PEN_INJECTOR | SUBCUTANEOUS | 2 refills | Status: DC

## 2023-08-21 MED ORDER — LANTUS SOLOSTAR 100 UNIT/ML ~~LOC~~ SOPN: 15.0000 [IU] | PEN_INJECTOR | Freq: Every day | SUBCUTANEOUS | 3 refills | Status: DC

## 2023-08-21 MED ORDER — GVOKE HYPOPEN 1-PACK 1 MG/0.2ML ~~LOC~~ SOAJ: 1.0000 mg | SUBCUTANEOUS | 2 refills | Status: DC | PRN

## 2023-08-21 NOTE — Patient Instructions (Addendum)
Diabetes regimen: Decrease Lantus to 15 units daily. Take humalog 1-3 units with meals three times a day.

## 2023-08-21 NOTE — Addendum Note (Signed)
Addended by: Nancy Manuele, [place] on: 08/21/2023 10:09 AM   Modules accepted: Orders
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
## 2023-08-22 ENCOUNTER — Other Ambulatory Visit: Payer: Self-pay

## 2023-08-22 DIAGNOSIS — E291 Testicular hypofunction: Secondary | ICD-10-CM

## 2023-08-22 MED ORDER — "BD ECLIPSE NEEDLE 21G X 1-1/2"" MISC": 1 refills | Status: DC

## 2023-08-22 MED ORDER — "BD ECLIPSE SHIELDED NEEDLE 18G X 1-1/2"" MISC": 1 refills | Status: DC

## 2023-08-30 LAB — ZNT8 ANTIBODIES: ZNT8 Antibodies: <10 U/mL (ref <15)

## 2023-08-30 LAB — IA-2 ANTIBODY: IA-2 Antibody: <5.4 U/mL (ref <5.4)

## 2023-08-30 LAB — C-PEPTIDE: C-Peptide: 0.99 ng/mL (ref 0.80–3.85)

## 2023-08-30 LAB — GLUTAMIC ACID DECARBOXYLASE AUTO ABS: Glutamic Acid Decarb Ab: <5 [IU]/mL (ref <5)

## 2023-09-09 ENCOUNTER — Encounter: Payer: Self-pay | Admitting: Family Medicine

## 2023-09-10 NOTE — Telephone Encounter (Signed)
Could you provide a physician who does botox shots for spasticity? My pain management doctor keeps saying they are going to and never follows through the last two months. My feet cramps have gotten so bad it hurts to walk a lot of times. It was their idea. They also checked and my insurance covers it. Thank you

## 2023-09-10 NOTE — Telephone Encounter (Signed)
TE sent to provider

## 2023-09-18 ENCOUNTER — Encounter: Payer: Self-pay | Admitting: Family Medicine

## 2023-09-18 ENCOUNTER — Telehealth: Payer: Self-pay | Admitting: *Deleted

## 2023-09-18 DIAGNOSIS — M545 Low back pain, unspecified: Secondary | ICD-10-CM | POA: Diagnosis not present

## 2023-09-18 DIAGNOSIS — M961 Postlaminectomy syndrome, not elsewhere classified: Secondary | ICD-10-CM | POA: Diagnosis not present

## 2023-09-18 DIAGNOSIS — M5412 Radiculopathy, cervical region: Secondary | ICD-10-CM | POA: Diagnosis not present

## 2023-09-18 DIAGNOSIS — Z79891 Long term (current) use of opiate analgesic: Secondary | ICD-10-CM | POA: Diagnosis not present

## 2023-09-18 DIAGNOSIS — S14109S Unspecified injury at unspecified level of cervical spinal cord, sequela: Secondary | ICD-10-CM | POA: Diagnosis not present

## 2023-09-18 DIAGNOSIS — M542 Cervicalgia: Secondary | ICD-10-CM | POA: Diagnosis not present

## 2023-09-18 DIAGNOSIS — R252 Cramp and spasm: Secondary | ICD-10-CM | POA: Diagnosis not present

## 2023-09-18 DIAGNOSIS — M546 Pain in thoracic spine: Secondary | ICD-10-CM | POA: Diagnosis not present

## 2023-09-18 DIAGNOSIS — G8929 Other chronic pain: Secondary | ICD-10-CM | POA: Diagnosis not present

## 2023-09-18 NOTE — Telephone Encounter (Signed)
TE sent to provider

## 2023-09-18 NOTE — Telephone Encounter (Signed)
Med refill

## 2023-09-23 ENCOUNTER — Ambulatory Visit
Admission: RE | Admit: 2023-09-23 | Discharge: 2023-09-23 | Disposition: A | Discharge status: Home / Self Care | Payer: Medicaid Other | Source: Ambulatory Visit

## 2023-09-23 ENCOUNTER — Other Ambulatory Visit (HOSPITAL_COMMUNITY): Payer: Self-pay

## 2023-09-23 ENCOUNTER — Ambulatory Visit: Payer: Self-pay

## 2023-09-23 VITALS — BP 128/73 | HR 85 | Temp 98.1°F | Resp 16 | Ht 76.0 in | Wt 163.1 lb

## 2023-09-23 DIAGNOSIS — R110 Nausea: Secondary | ICD-10-CM

## 2023-09-23 LAB — POCT FASTING CBG KUC MANUAL ENTRY: POCT Glucose (KUC): 137 mg/dL — AB (ref 70–99)

## 2023-09-23 LAB — POCT URINALYSIS DIP (MANUAL ENTRY)
Bilirubin, UA: NEGATIVE
Blood, UA: NEGATIVE
Glucose, UA: NEGATIVE mg/dL
Leukocytes, UA: NEGATIVE
Nitrite, UA: NEGATIVE
Protein Ur, POC: NEGATIVE mg/dL
Spec Grav, UA: 1.01 (ref 1.010–1.025)
Urobilinogen, UA: 0.2 U/dL
pH, UA: 7 (ref 5.0–8.0)

## 2023-09-23 MED ORDER — ONDANSETRON 4 MG PO TBDP
4.0000 mg | ORAL_TABLET | Freq: Three times a day (TID) | ORAL | 0 refills | Status: AC | PRN
Start: 2023-09-23 — End: ?

## 2023-09-23 NOTE — Telephone Encounter (Signed)
Patient Stated " Remeron is not working and that he is still has having nausea and not able to eat". Patient was here last week.  Please advice.

## 2023-09-23 NOTE — Telephone Encounter (Signed)
  Chief Complaint: nausea Symptoms: nausea and decreased appetite  Frequency: 1 week  Pertinent Negatives: Patient denies vomiting  Disposition: [] ED /[x] Urgent Care (no appt availability in office) / [] Appointment(In office/virtual)/ []  [facility]/ [] Home Care/ [] Refused Recommended Disposition /[]  Mobile Bus/ []  Follow-up with PCP Additional Notes: pt states he has been on Remeron for 2 years and is not effective. Pt states he is eating but small amounts because of the nausea. Pt wanted to be seen but no appts until 10/06/23, scheduled UC appt today at 1700.   Summary: no appetite>1 week   Pt states for about a week he has no appetite. He is on an appetite medicine for 2 yrs that may need to be adjusted he said.  Pt wanted to come in Friday, but no appts until 10/29. Please advise.     Reason for Disposition  Nausea lasts > 1 week  Answer Assessment - Initial Assessment Questions 1. NAUSEA SEVERITY: "How bad is the nausea?" (e.g., mild, moderate, severe; dehydration, weight loss)   - MILD: loss of appetite without change in eating habits   - MODERATE: decreased oral intake without significant weight loss, dehydration, or malnutrition   - SEVERE: inadequate caloric or fluid intake, significant weight loss, symptoms of dehydration     Moderate  2. ONSET: "When did the nausea begin?"     1 week  3. VOMITING: "Any vomiting?" If Yes, ask: "How many times today?"     Not yet but came close  4. RECURRENT SYMPTOM: "Have you had nausea before?" If Yes, ask: "When was the last time?" "What happened that time?"     Yes on Remeron for appetite stimulus  Protocols used: Nausea-A-AH
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
## 2023-09-23 NOTE — ED Triage Notes (Signed)
appetite decreased x 2 week - Entered by patient  Additional symptoms: Nausea (continuous). I do take Remeron (for increased appetite), I am still on that, ? Need for change. I am losing weight due to not eating. Vomiting only 1x "3-4 days ago". Voids "normal" (no dysuria, just decreased amounts). Stools "history of constipation".

## 2023-09-25 ENCOUNTER — Other Ambulatory Visit (HOSPITAL_COMMUNITY): Payer: Self-pay

## 2023-09-25 ENCOUNTER — Telehealth: Payer: Self-pay | Admitting: Pharmacy Technician

## 2023-09-25 NOTE — Telephone Encounter (Signed)
Pharmacy Patient Advocate Encounter  Received notification from [facility] Medicaid/[employer] that Prior Authorization for [facility] 3 sensors has been APPROVED from 09/23/23 to 09/22/24. Ran test claim, Copay is $0. This test claim was processed through [facility]- copay amounts may vary at other pharmacies due to pharmacy/plan contracts, or as the patient moves through the different stages of their insurance plan.   PA #/Case ID/Reference #: PA Case: [id number]

## 2023-10-02 ENCOUNTER — Encounter (HOSPITAL_BASED_OUTPATIENT_CLINIC_OR_DEPARTMENT_OTHER): Payer: Self-pay | Admitting: Orthopaedic Surgery

## 2023-10-03 ENCOUNTER — Telehealth: Payer: Self-pay | Admitting: *Deleted

## 2023-10-03 ENCOUNTER — Other Ambulatory Visit: Payer: Self-pay | Admitting: Family Medicine

## 2023-10-03 MED ORDER — PREGABALIN 50 MG PO CAPS: 50.0000 mg | ORAL_CAPSULE | Freq: Two times a day (BID) | ORAL | 0 refills | Status: DC

## 2023-10-03 NOTE — Telephone Encounter (Signed)
pregabalin (LYRICA) 50 MG capsule  Refill request

## 2023-10-03 NOTE — Telephone Encounter (Signed)
TE has been resent to provider for pregabalin (LYRICA) 50 MG capsule

## 2023-10-03 NOTE — Telephone Encounter (Signed)
pregabalin (LYRICA) 50 MG capsule

## 2023-10-07 ENCOUNTER — Encounter (HOSPITAL_BASED_OUTPATIENT_CLINIC_OR_DEPARTMENT_OTHER): Payer: Self-pay

## 2023-10-07 ENCOUNTER — Encounter: Payer: Self-pay | Admitting: Urology

## 2023-10-07 ENCOUNTER — Ambulatory Visit (HOSPITAL_BASED_OUTPATIENT_CLINIC_OR_DEPARTMENT_OTHER): Payer: Medicaid Other | Admitting: Student

## 2023-10-07 DIAGNOSIS — E291 Testicular hypofunction: Secondary | ICD-10-CM

## 2023-10-08 MED ORDER — BD ECLIPSE SHIELDED NEEDLE 18G X 1-1/2" MISC
1 refills | Status: DC
Start: 2023-10-08 — End: 2024-03-09

## 2023-10-08 MED ORDER — SYRINGE 2-3 ML 3 ML MISC
2 refills | Status: DC
Start: 2023-10-08 — End: 2024-03-09

## 2023-10-08 MED ORDER — BD ECLIPSE NEEDLE 21G X 1-1/2" MISC
1 refills | Status: DC
Start: 2023-10-08 — End: 2024-03-09

## 2023-10-15 DIAGNOSIS — M542 Cervicalgia: Secondary | ICD-10-CM | POA: Diagnosis not present

## 2023-10-15 DIAGNOSIS — Z79891 Long term (current) use of opiate analgesic: Secondary | ICD-10-CM | POA: Diagnosis not present

## 2023-10-15 DIAGNOSIS — M546 Pain in thoracic spine: Secondary | ICD-10-CM | POA: Diagnosis not present

## 2023-10-15 DIAGNOSIS — G8929 Other chronic pain: Secondary | ICD-10-CM | POA: Diagnosis not present

## 2023-10-15 DIAGNOSIS — M5412 Radiculopathy, cervical region: Secondary | ICD-10-CM | POA: Diagnosis not present

## 2023-10-15 DIAGNOSIS — S14109S Unspecified injury at unspecified level of cervical spinal cord, sequela: Secondary | ICD-10-CM | POA: Diagnosis not present

## 2023-10-15 DIAGNOSIS — M545 Low back pain, unspecified: Secondary | ICD-10-CM | POA: Diagnosis not present

## 2023-10-15 DIAGNOSIS — R252 Cramp and spasm: Secondary | ICD-10-CM | POA: Diagnosis not present

## 2023-10-15 DIAGNOSIS — M961 Postlaminectomy syndrome, not elsewhere classified: Secondary | ICD-10-CM | POA: Diagnosis not present

## 2023-10-20 ENCOUNTER — Other Ambulatory Visit: Payer: Self-pay | Admitting: Family Medicine

## 2023-10-20 NOTE — Telephone Encounter (Signed)
Medication Refill - Medication: cyclobenzaprine (FLEXERIL) 10 MG tablet   Has the patient contacted their pharmacy? No Pt was trying ot request thru mychart. I let him know to contact the pharmacy in the future  Preferred Pharmacy (with phone number or street name):  [id number] - [place],  - [address] Phone: [phone]  Fax: [phone]     Has the patient been seen for an appointment in the last year OR does the patient have an upcoming appointment? yes  Agent: Please be advised that RX refills may take up to 3 business days. We ask that you follow-up with your pharmacy.

## 2023-10-21 NOTE — Telephone Encounter (Signed)
Requested medication (s) are due for refill today: Yes  Requested medication (s) are on the active medication list: Yes  Last refill:  06/18/23 #60,2RF  Future visit scheduled: No  Notes to clinic:  Unable to refill per protocol, cannot delegate.      Requested Prescriptions  Pending Prescriptions Disp Refills   cyclobenzaprine (FLEXERIL) 10 MG tablet 60 tablet 2    Sig: Take 1 tablet (10 mg total) by mouth 2 (two) times daily as needed for muscle spasms.     Not Delegated - Analgesics:  Muscle Relaxants Failed - 10/20/2023  9:17 AM      Failed - This refill cannot be delegated      Passed - Valid encounter within last 6 months    Recent Outpatient Visits           4 months ago Spasticity   [facility] [facility], MD   6 months ago Sexual dysfunction   [place] [facility], MD   8 months ago Chronic pain disorder   [place] [facility], Lauris Poag, MD   1 year ago Type 2 diabetes mellitus with other specified complication, with long-term current use of insulin [facility])   [place] [facility], Lauris Poag, MD   1 year ago Uncontrolled type 2 diabetes mellitus with hypoglycemia without coma [facility])   [place] [facility], MD

## 2023-10-22 ENCOUNTER — Encounter: Payer: Self-pay | Admitting: Podiatry

## 2023-10-22 ENCOUNTER — Encounter: Payer: Self-pay | Admitting: Endocrinology

## 2023-10-22 ENCOUNTER — Other Ambulatory Visit: Payer: Self-pay

## 2023-10-22 DIAGNOSIS — E1165 Type 2 diabetes mellitus with hyperglycemia: Secondary | ICD-10-CM

## 2023-10-22 MED ORDER — FREESTYLE LIBRE 3 SENSOR MISC
3 refills | Status: DC
Start: 2023-10-22 — End: 2023-10-23

## 2023-10-23 ENCOUNTER — Other Ambulatory Visit: Payer: Self-pay

## 2023-10-23 DIAGNOSIS — E119 Type 2 diabetes mellitus without complications: Secondary | ICD-10-CM

## 2023-10-23 MED ORDER — CYCLOBENZAPRINE HCL 10 MG PO TABS: 10.0000 mg | ORAL_TABLET | Freq: Two times a day (BID) | ORAL | 2 refills | Status: DC | PRN

## 2023-10-23 MED ORDER — FREESTYLE LIBRE 3 PLUS SENSOR MISC
1.0000 | 3 refills | Status: DC
Start: 2023-10-23 — End: 2023-10-24

## 2023-10-24 ENCOUNTER — Ambulatory Visit (HOSPITAL_BASED_OUTPATIENT_CLINIC_OR_DEPARTMENT_OTHER): Payer: Medicaid Other | Admitting: Student

## 2023-10-24 ENCOUNTER — Other Ambulatory Visit: Payer: Self-pay

## 2023-10-24 ENCOUNTER — Encounter (HOSPITAL_BASED_OUTPATIENT_CLINIC_OR_DEPARTMENT_OTHER): Payer: Self-pay | Admitting: Student

## 2023-10-24 DIAGNOSIS — G8929 Other chronic pain: Secondary | ICD-10-CM | POA: Diagnosis not present

## 2023-10-24 DIAGNOSIS — M25521 Pain in right elbow: Secondary | ICD-10-CM | POA: Diagnosis not present

## 2023-10-24 DIAGNOSIS — E119 Type 2 diabetes mellitus without complications: Secondary | ICD-10-CM

## 2023-10-24 DIAGNOSIS — T148XXA Other injury of unspecified body region, initial encounter: Secondary | ICD-10-CM | POA: Diagnosis not present

## 2023-10-24 MED ORDER — LIDOCAINE HCL 1 % IJ SOLN
2.0000 mL | INTRAMUSCULAR | Status: AC | PRN
Start: 2023-10-24 — End: 2023-10-24
  Administered 2023-10-24: 2 mL

## 2023-10-24 MED ORDER — FREESTYLE LIBRE 3 PLUS SENSOR MISC
1.0000 | 3 refills | Status: DC
Start: 2023-10-24 — End: 2024-08-23

## 2023-10-24 MED ORDER — TRIAMCINOLONE ACETONIDE 40 MG/ML IJ SUSP
2.0000 mL | INTRAMUSCULAR | Status: AC | PRN
Start: 2023-10-24 — End: 2023-10-24
  Administered 2023-10-24: 2 mL via INTRA_ARTICULAR

## 2023-10-24 NOTE — Progress Notes (Signed)
Chief Complaint: Right elbow pain    History of Present Illness:   10/24/23: Patient presents today for follow up and repeat injection of the right elbow.  He states that last injection gave him 3-4 weeks of good relief.  Pain today is moderate and it is making his tasks at work difficult.  In recent discussion with Dr. Steward Drone, patient did express trial of another injection before considering surgical intervention.   06/26/2023: Carlos Cordia. is a 52 y.o. male presenting today for follow-up of his right elbow.  Reports that he hit his elbow about 2 months ago and has continued to have pain.  Pain levels are 7 out of 10 and have not changed since last visit.  He has been icing and performing stretches.  Denies any new changes in appearance or sensation at the elbow.    Surgical History:   None  PMH/PSH/Family History/Social History/Meds/Allergies:    Past Medical History:  Diagnosis Date   ADHD (attention deficit hyperactivity disorder)    Alcohol abuse    Alcohol abuse 12/23/2012   Depression    Diabetes mellitus without complication (HCC)    History of exercise stress test    03-18-2013--  normal   History of kidney stones    Hyperlipidemia    Kidney stones    Left ureteral stone    Spasticity    Type 2 diabetes mellitus [facility])    Past Surgical History:  Procedure Laterality Date   ANTERIOR CERVICAL DECOMP/DISCECTOMY FUSION N/A 08/30/2021   Procedure: CERVICAL FIVE-SIX ANTERIOR CERVICAL DECOMPRESSION/DISCECTOMY FUSION;  Surgeon: Bedelia Person, MD;  Location: [facility] OR;  Service: Neurosurgery;  Laterality: N/A;   CARDIAC CATHETERIZATION  11-27-2006  dr Reyes Ivan   non-obstructive CAD/  20% proximal and mid LAD/  perserved LVF,  ef 55-60%   CYSTOSCOPY W/ RETROGRADES Left 05/17/2015   Procedure: CYSTOSCOPY WITH RETROGRADE PYELOGRAM;  Surgeon: Jerilee Field, MD;  Location: [facility];  Service: Urology;  Laterality: Left;    CYSTOSCOPY/URETEROSCOPY/HOLMIUM LASER/STENT PLACEMENT Left 05/17/2015   Procedure: LEFT URETEROSCOPY/HOLMIUM LASER/STENT PLACEMENT;  Surgeon: Jerilee Field, MD;  [facility];  Service: Urology;  Laterality: Left;   EXTRACORPOREAL SHOCK WAVE LITHOTRIPSY Left 05-08-2015   EXTRACTION RIGHT MANDIBULAR , PREMOLAR/  MAXILLARY MANDIBULAR FIXATION WITH SCREWS  08-03-2008   LUMBAR PERCUTANEOUS PEDICLE SCREW 4 LEVEL N/A 09/04/2021   Procedure: Thoracic Four-Thoracic Eight  Percutaneous Instrumented Fusion;  Surgeon: Bedelia Person, MD;  Location: [facility] OR;  Service: Neurosurgery;  Laterality: N/A;   ORIF FOUR HOLD PLATE AND MAXILLOMANDIBULAR FIXATION W/ ARCH BARS  08-05-2008   REMOVAL ARCH BARS 09-15-2008   TRANSTHORACIC ECHOCARDIOGRAM  04-06-2008  dr Reyes Ivan   normal LVF,  ef 55-60%,  trivial MR and TR   Social History   Socioeconomic History   Marital status: Married    Spouse name: Not on file   Number of children: Not on file   Years of education: Not on file   Highest education level: Not on file  Occupational History   Not on file  Tobacco Use   Smoking status: Never   Smokeless tobacco: Never  Vaping Use   Vaping status: Never Used  Substance and Sexual Activity   Alcohol use: Yes    Alcohol/week: 10.0 standard drinks of alcohol  Types: 10 Cans of beer per week    Comment: Occassionally.   Drug use: No   Sexual activity: Not Currently  Other Topics Concern   Not on file  Social History Narrative   ** Merged History Encounter **       Social Determinants of Health   Financial Resource Strain: Not on file  Food Insecurity: Not on file  Transportation Needs: Not on file  Physical Activity: Not on file  Stress: Not on file  Social Connections: Not on file   Family History  Problem Relation Age of Onset   Cancer Mother    Diabetes Mother        type 1   Heart disease Mother        pacemaker   Cancer - Colon Mother    Alcohol abuse Father     Alcohol abuse Maternal Uncle    Diabetes Maternal Grandmother    Alcohol abuse Maternal Grandfather    Diabetes Maternal Grandfather    Prostate cancer Neg Hx    No Known Allergies  Current Outpatient Medications  Medication Sig Dispense Refill   acetaminophen (TYLENOL) 325 MG tablet Take 1-2 tablets (325-650 mg total) by mouth every 4 (four) hours as needed for mild pain.     aspirin EC 81 MG EC tablet Take 1 tablet (81 mg total) by mouth daily. Swallow whole. 30 tablet 11   atorvastatin (LIPITOR) 10 MG tablet Take 1 tablet (10 mg total) by mouth daily. 90 tablet 3   B Complex Vitamins (VITAMIN B COMPLEX) TABS Take1 tablet by mouth daily. 30 tablet 0   baclofen (LIORESAL) 20 MG tablet Take 2 tablets (40 mg total) by mouth 3 (three) times daily. For spasticity 540 each 1   BUTRANS 20 MCG/HR PTWK 1 patch once a week.     Continuous Glucose Sensor (FREESTYLE LIBRE 3 PLUS SENSOR) MISC Inject 1 Device into the skin continuous. Change every 15 days 6 each 3   cyclobenzaprine (FLEXERIL) 10 MG tablet Take 1 tablet (10 mg total) by mouth 2 (two) times daily as needed for muscle spasms. 60 tablet 2   docusate sodium (COLACE) 100 MG capsule Take 100 mg by mouth 2 (two) times daily.     Glucagon (GVOKE HYPOPEN 1-PACK) 1 MG/0.2ML SOAJ Inject 1 mg into the skin as needed (low blood sugar with impaired consciousness). 0.4 mL 2   insulin glargine (LANTUS SOLOSTAR) 100 UNIT/ML Solostar Pen Inject 15 Units into the skin daily. 15 mL 3   insulin lispro (HUMALOG KWIKPEN) 100 UNIT/ML KwikPen Take 1-3 units  BEFORE each meal 3 times a day. 15 mL 2   Insulin Pen Needle 31G X 5 MM MISC With pen 100 each 1   metFORMIN (GLUCOPHAGE) 1000 MG tablet TAKE 1 TABLET (1,000 MG TOTAL) BY MOUTH TWICE A DAY WITH FOOD 180 tablet 1   mirtazapine (REMERON) 15 MG tablet TAKE 1 TABLET BY MOUTH DAILY AT  BEDTIME 90 tablet 1   Multiple Vitamin (MULTIVITAMIN WITH MINERALS) TABS tablet Take 1 tablet by mouth daily.     naloxone  (NARCAN) nasal spray 4 mg/0.1 mL SMARTSIG:1 Both Nares Daily     NEEDLE, DISP, 18 G (BD ECLIPSE SHIELDED NEEDLE) 18G X 1-1/2" MISC Use one needle every 2 weeks to draw the medication up into the syringe prior to injection. 50 each 1   NEEDLE, DISP, 21 G (BD ECLIPSE NEEDLE) 21G X 1-1/2" MISC Use one needle every 2 weeks to inject testosterone into  the muscle. 50 each 1   ondansetron (ZOFRAN-ODT) 4 MG disintegrating tablet Take 1 tablet (4 mg total) by mouth every 8 (eight) hours as needed. 20 tablet 0   Oxycodone HCl 10 MG TABS Take 1 tablet (10 mg total) by mouth 4 (four) times daily as needed. Do not refill until 11/25/2022 120 tablet 0   oxyCODONE-acetaminophen (PERCOCET) 10-325 MG tablet Take 1 tablet by mouth every 6 (six) hours as needed for pain.     pregabalin (LYRICA) 50 MG capsule Take 1 capsule (50 mg total) by mouth 2 (two) times daily. 60 capsule 0   Syringe, Disposable, (2-3CC SYRINGE) 3 ML MISC Use one syringe every 2 weeks with testosterone injection. 25 each 2   tamsulosin (FLOMAX) 0.4 MG CAPS capsule Take 1 capsule (0.4 mg total) by mouth daily. 90 capsule 2   testosterone cypionate (DEPOTESTOSTERONE CYPIONATE) 200 MG/ML injection Inject 1 mL (200 mg total) into the muscle every 14 (fourteen) days. 10 mL 0   No current facility-administered medications for this visit.   No results found.  Review of Systems:   A ROS was performed including pertinent positives and negatives as documented in the HPI.  Physical Exam :   Constitutional: NAD and appears stated age Neurological: Alert and oriented Psych: Appropriate affect and cooperative There were no vitals taken for this visit.   Comprehensive Musculoskeletal Exam:    Active elbow range of motion is 0-120 degrees.  Point tenderness over the medial epicondyle.  Pain elicited with resisted wrist flexion.  No erythema, ecchymosis, or warmth of the elbow.  Radial pulse 2+.  Imaging:     Assessment:   52 year old male with  chronic pain over the right medial epicondyle.  He had an injury back in April of this year when he hit his medial elbow directly on a table.  Will repeat injection today as discussed with Dr. Steward Drone.  Injection was performed today of the right medial epicondyle with caution to avoid the ulnar nerve which was located in the ulnar groove.  Patient tolerated the procedure well.  Plan discussed with the for the injection and follow-up as needed with Dr. Steward Drone.  Plan :    -Ultrasound-guided cortisone injection performed of the right medial epicondyle today -Follow up with Dr. Steward Drone as needed      Procedure Note  Patient: Carlos [name].             Date of Birth: February 18, 1971           MRN: [id number]             Visit Date: 10/24/2023  Procedures: Visit Diagnoses:  1. Chronic pain of right elbow   2. Bone bruise      Medium Joint Inj: R elbow (Medial epicondyle) on 10/24/2023 2:57 PM Indications: pain Details: 25 G 1.5 in needle, ultrasound-guided medial approach Medications: 2 mL lidocaine 1 %; 2 mL triamcinolone acetonide 40 MG/ML Outcome: tolerated well, no immediate complications Procedure, treatment alternatives, risks and benefits explained, specific risks discussed. Consent was given by the patient. Immediately prior to procedure a time out was called to verify the correct patient, procedure, equipment, support staff and site/side marked as required. Patient was prepped and draped in the usual sterile fashion.      I personally saw and evaluated the patient, and participated in the management and treatment plan.  Hazle Nordmann, PA-C Orthopedics

## 2023-10-29 ENCOUNTER — Encounter (HOSPITAL_BASED_OUTPATIENT_CLINIC_OR_DEPARTMENT_OTHER): Payer: Self-pay | Admitting: Orthopaedic Surgery

## 2023-11-04 ENCOUNTER — Encounter: Payer: Self-pay | Admitting: Family Medicine

## 2023-11-05 DIAGNOSIS — M5412 Radiculopathy, cervical region: Secondary | ICD-10-CM | POA: Diagnosis not present

## 2023-11-05 DIAGNOSIS — G8929 Other chronic pain: Secondary | ICD-10-CM | POA: Diagnosis not present

## 2023-11-05 DIAGNOSIS — M961 Postlaminectomy syndrome, not elsewhere classified: Secondary | ICD-10-CM | POA: Diagnosis not present

## 2023-11-05 DIAGNOSIS — M545 Low back pain, unspecified: Secondary | ICD-10-CM | POA: Diagnosis not present

## 2023-11-05 DIAGNOSIS — Z79891 Long term (current) use of opiate analgesic: Secondary | ICD-10-CM | POA: Diagnosis not present

## 2023-11-05 DIAGNOSIS — S14109S Unspecified injury at unspecified level of cervical spinal cord, sequela: Secondary | ICD-10-CM | POA: Diagnosis not present

## 2023-11-05 DIAGNOSIS — M546 Pain in thoracic spine: Secondary | ICD-10-CM | POA: Diagnosis not present

## 2023-11-05 DIAGNOSIS — R252 Cramp and spasm: Secondary | ICD-10-CM | POA: Diagnosis not present

## 2023-11-05 DIAGNOSIS — M542 Cervicalgia: Secondary | ICD-10-CM | POA: Diagnosis not present

## 2023-11-06 ENCOUNTER — Encounter: Payer: Self-pay | Admitting: Family Medicine

## 2023-11-10 ENCOUNTER — Encounter: Payer: Self-pay | Admitting: Family Medicine

## 2023-11-10 ENCOUNTER — Other Ambulatory Visit: Payer: Self-pay | Admitting: Family Medicine

## 2023-11-10 MED ORDER — SILDENAFIL CITRATE 100 MG PO TABS: 50.0000 mg | ORAL_TABLET | Freq: Every day | ORAL | 2 refills | Status: DC | PRN

## 2023-11-11 NOTE — Telephone Encounter (Signed)
Patient scheduled.

## 2023-11-11 NOTE — Telephone Encounter (Signed)
Please schedule appointment for further refills  per pcp

## 2023-11-17 ENCOUNTER — Encounter: Payer: Self-pay | Admitting: Family Medicine

## 2023-11-17 ENCOUNTER — Telehealth (INDEPENDENT_AMBULATORY_CARE_PROVIDER_SITE_OTHER): Payer: Medicaid Other | Admitting: Family Medicine

## 2023-11-17 DIAGNOSIS — G4700 Insomnia, unspecified: Secondary | ICD-10-CM

## 2023-11-17 DIAGNOSIS — F418 Other specified anxiety disorders: Secondary | ICD-10-CM

## 2023-11-17 MED ORDER — MIRTAZAPINE 15 MG PO TABS: ORAL_TABLET | ORAL | 1 refills | Status: AC

## 2023-11-17 MED ORDER — DOXEPIN HCL 150 MG PO CAPS: 150.0000 mg | ORAL_CAPSULE | Freq: Every day | ORAL | 1 refills | Status: DC

## 2023-11-17 NOTE — Progress Notes (Unsigned)
Virtual Visit via Video Note  I connected with Carlos [name]. on 11/17/23 at  3:20 PM EST by a video enabled telemedicine application and verified that I am speaking with the correct person using two identifiers.  Location: Patient: Carlos [name] Provider: [place]   I discussed the limitations of evaluation and management by telemedicine and the availability of in person appointments. The patient expressed understanding and agreed to proceed.  History of Present Illness:  Patient reports that he has been having difficulty sleeping and the present therapy is completely unhelpful. He has also been on ambien in the past and does not want that .   Observations/Objective:   Assessment and Plan: 1. Insomnia, unspecified type Doxepin prescribed  2. Depression with anxiety Remeron refilled   Follow Up Instructions:    I discussed the assessment and treatment plan with the patient. The patient was provided an opportunity to ask questions and all were answered. The patient agreed with the plan and demonstrated an understanding of the instructions.   The patient was advised to call back or seek an in-person evaluation if the symptoms worsen or if the condition fails to improve as anticipated.  I provided 7 minutes of non-face-to-face time during this encounter.   Tommie Raymond, MD

## 2023-11-18 ENCOUNTER — Ambulatory Visit (INDEPENDENT_AMBULATORY_CARE_PROVIDER_SITE_OTHER): Payer: Medicaid Other | Admitting: Endocrinology

## 2023-11-18 ENCOUNTER — Encounter: Payer: Self-pay | Admitting: Endocrinology

## 2023-11-18 ENCOUNTER — Encounter: Payer: Self-pay | Admitting: Family Medicine

## 2023-11-18 VITALS — BP 136/70 | HR 86 | Resp 20 | Ht 76.0 in | Wt 157.4 lb

## 2023-11-18 DIAGNOSIS — E119 Type 2 diabetes mellitus without complications: Secondary | ICD-10-CM | POA: Diagnosis not present

## 2023-11-18 DIAGNOSIS — Z794 Long term (current) use of insulin: Secondary | ICD-10-CM

## 2023-11-18 LAB — POCT GLYCOSYLATED HEMOGLOBIN (HGB A1C): Hemoglobin A1C: 6.8 % — AB (ref 4.0–5.6)

## 2023-11-18 MED ORDER — GVOKE HYPOPEN 1-PACK 1 MG/0.2ML ~~LOC~~ SOAJ: 1.0000 mg | SUBCUTANEOUS | 2 refills | Status: AC | PRN
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
## 2023-11-19 ENCOUNTER — Encounter: Payer: Self-pay | Admitting: Family Medicine

## 2023-11-19 ENCOUNTER — Encounter: Payer: Self-pay | Admitting: Endocrinology

## 2023-12-04 DIAGNOSIS — M546 Pain in thoracic spine: Secondary | ICD-10-CM | POA: Diagnosis not present

## 2023-12-04 DIAGNOSIS — G8929 Other chronic pain: Secondary | ICD-10-CM | POA: Diagnosis not present

## 2023-12-04 DIAGNOSIS — M961 Postlaminectomy syndrome, not elsewhere classified: Secondary | ICD-10-CM | POA: Diagnosis not present

## 2023-12-04 DIAGNOSIS — M545 Low back pain, unspecified: Secondary | ICD-10-CM | POA: Diagnosis not present

## 2023-12-04 DIAGNOSIS — S14109S Unspecified injury at unspecified level of cervical spinal cord, sequela: Secondary | ICD-10-CM | POA: Diagnosis not present

## 2023-12-04 DIAGNOSIS — M5412 Radiculopathy, cervical region: Secondary | ICD-10-CM | POA: Diagnosis not present

## 2023-12-04 DIAGNOSIS — Z79891 Long term (current) use of opiate analgesic: Secondary | ICD-10-CM | POA: Diagnosis not present

## 2023-12-04 DIAGNOSIS — M542 Cervicalgia: Secondary | ICD-10-CM | POA: Diagnosis not present

## 2023-12-04 DIAGNOSIS — R252 Cramp and spasm: Secondary | ICD-10-CM | POA: Diagnosis not present

## 2023-12-05 ENCOUNTER — Encounter: Payer: Self-pay | Admitting: Family Medicine

## 2023-12-13 ENCOUNTER — Other Ambulatory Visit: Payer: Self-pay | Admitting: Family Medicine

## 2023-12-15 ENCOUNTER — Encounter: Payer: Self-pay | Admitting: Endocrinology

## 2023-12-15 ENCOUNTER — Encounter: Payer: Self-pay | Admitting: *Deleted

## 2023-12-15 ENCOUNTER — Telehealth: Payer: Self-pay | Admitting: Family Medicine

## 2023-12-15 ENCOUNTER — Other Ambulatory Visit: Payer: Self-pay

## 2023-12-15 DIAGNOSIS — E11649 Type 2 diabetes mellitus with hypoglycemia without coma: Secondary | ICD-10-CM

## 2023-12-15 MED ORDER — METFORMIN HCL 1000 MG PO TABS: ORAL_TABLET | ORAL | 1 refills | Status: DC

## 2023-12-15 NOTE — Telephone Encounter (Signed)
Copied from CRM [id number]. Topic: General - Other >> Dec 15, 2023 10:40 AM Franchot Heidelberg wrote: Reason for CRM: Pt called following up on medication refills for metformin and doxepin, he sent a mychart message and also the pharmacy sent a request

## 2023-12-23 ENCOUNTER — Encounter (HOSPITAL_BASED_OUTPATIENT_CLINIC_OR_DEPARTMENT_OTHER): Payer: Self-pay | Admitting: Orthopaedic Surgery

## 2023-12-23 ENCOUNTER — Ambulatory Visit: Payer: Medicaid Other | Admitting: Podiatry

## 2023-12-23 DIAGNOSIS — M2042 Other hammer toe(s) (acquired), left foot: Secondary | ICD-10-CM

## 2023-12-23 DIAGNOSIS — L603 Nail dystrophy: Secondary | ICD-10-CM

## 2023-12-23 NOTE — Progress Notes (Signed)
  Subjective:  Patient ID: Carlos [name]., male    DOB: 01-06-1971,  MRN: [id number]  Chief Complaint  Patient presents with   Nail Problem    He reports he has noticed that he had a toenail come off the left foot, He is diabetic, has a spinal cord injury and is not sure if any of this is related. The 2nd and 3rd toe are worst.  He is diabetic and A1C is 6.8/     53 y.o. male presents with the above complaint. History confirmed with patient.  Has a history of spasticity that is being evaluated he is hoping to have Botox injections for this in the spring  Objective:  Physical Exam: warm, good capillary refill, no trophic changes or ulcerative lesions, normal DP and PT pulses, normal sensory exam, and semirigid hammertoe deformities of the lesser toes of the left foot he has dystrophy of the second and third toenail.  Assessment:   1. Hammertoe of left foot   2. Nail dystrophy      Plan:  Patient was evaluated and treated and all questions answered.  We discussed the dystrophy as a secondary result of a spasticity and hammertoe contractures we discussed surgical nonsurgical treatment of debris the nails of the dystrophic areas to alleviate pain today.  He we will follow-up with me as needed I also dispensed him silicone toe caps and toe crest to offload the lesions if worsening and not improving and having pain he will follow-up with me for surgical correction.  Return if symptoms worsen or fail to improve.

## 2023-12-23 NOTE — Patient Instructions (Signed)
 More silicone pads can be purchased from:  [email or web address]

## 2023-12-24 ENCOUNTER — Other Ambulatory Visit (HOSPITAL_BASED_OUTPATIENT_CLINIC_OR_DEPARTMENT_OTHER): Payer: Self-pay | Admitting: Orthopaedic Surgery

## 2023-12-24 DIAGNOSIS — M7670 Peroneal tendinitis, unspecified leg: Secondary | ICD-10-CM

## 2023-12-26 ENCOUNTER — Telehealth (INDEPENDENT_AMBULATORY_CARE_PROVIDER_SITE_OTHER): Payer: Medicaid Other | Admitting: Family Medicine

## 2023-12-26 DIAGNOSIS — G4700 Insomnia, unspecified: Secondary | ICD-10-CM

## 2023-12-26 MED ORDER — DOXEPIN HCL 50 MG PO CAPS: 50.0000 mg | ORAL_CAPSULE | Freq: Every evening | ORAL | 0 refills | Status: DC | PRN

## 2023-12-26 NOTE — Progress Notes (Signed)
 Virtual Visit via Video Note  I connected with [name]. on 12/26/23 at 10:40 AM EST by a video enabled telemedicine application and verified that I am speaking with the correct person using two identifiers.  Location: Patient: Carlos [name] Provider: [place]   I discussed the limitations of evaluation and management by telemedicine and the availability of in person appointments. The patient expressed understanding and agreed to proceed.  History of Present Illness: Patient reports that med is working for insomnia but he believes that he may need a reduced dose.    Observations/Objective:   Assessment and Plan: 1. Insomnia, unspecified type (Primary) Decreased dose of doxepin  form 150 mg to 50 mg at HS    Follow Up Instructions:    I discussed the assessment and treatment plan with the patient. The patient was provided an opportunity to ask questions and all were answered. The patient agreed with the plan and demonstrated an understanding of the instructions.   The patient was advised to call back or seek an in-person evaluation if the symptoms worsen or if the condition fails to improve as anticipated.  I provided 5 minutes of non-face-to-face time during this encounter.   Tanda Raguel SQUIBB, MD

## 2023-12-29 ENCOUNTER — Encounter: Payer: Self-pay | Admitting: Family Medicine

## 2024-01-01 ENCOUNTER — Ambulatory Visit: Payer: Medicaid Other | Admitting: Neurology

## 2024-01-01 ENCOUNTER — Encounter: Payer: Self-pay | Admitting: Neurology

## 2024-01-01 VITALS — BP 132/88 | HR 90 | Ht 76.0 in | Wt 155.0 lb

## 2024-01-01 DIAGNOSIS — M62838 Other muscle spasm: Secondary | ICD-10-CM | POA: Diagnosis not present

## 2024-01-01 DIAGNOSIS — R269 Unspecified abnormalities of gait and mobility: Secondary | ICD-10-CM | POA: Diagnosis not present

## 2024-01-01 DIAGNOSIS — R252 Cramp and spasm: Secondary | ICD-10-CM

## 2024-01-01 MED ORDER — DULOXETINE HCL 60 MG PO CPEP: 60.0000 mg | ORAL_CAPSULE | Freq: Every day | ORAL | 6 refills | Status: DC
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
## 2024-01-12 ENCOUNTER — Telehealth: Payer: Self-pay | Admitting: Neurology

## 2024-01-12 NOTE — Telephone Encounter (Signed)
healthy blue Berkley Harvey: [phone] exp. 01/12/24-03/11/24 sent to GI [phone]

## 2024-01-15 DIAGNOSIS — R252 Cramp and spasm: Secondary | ICD-10-CM | POA: Diagnosis not present

## 2024-01-15 DIAGNOSIS — M961 Postlaminectomy syndrome, not elsewhere classified: Secondary | ICD-10-CM | POA: Diagnosis not present

## 2024-01-15 DIAGNOSIS — G8929 Other chronic pain: Secondary | ICD-10-CM | POA: Diagnosis not present

## 2024-01-15 DIAGNOSIS — M545 Low back pain, unspecified: Secondary | ICD-10-CM | POA: Diagnosis not present

## 2024-01-15 DIAGNOSIS — S14109S Unspecified injury at unspecified level of cervical spinal cord, sequela: Secondary | ICD-10-CM | POA: Diagnosis not present

## 2024-01-15 DIAGNOSIS — Z79891 Long term (current) use of opiate analgesic: Secondary | ICD-10-CM | POA: Diagnosis not present

## 2024-01-15 DIAGNOSIS — M542 Cervicalgia: Secondary | ICD-10-CM | POA: Diagnosis not present

## 2024-01-15 DIAGNOSIS — M546 Pain in thoracic spine: Secondary | ICD-10-CM | POA: Diagnosis not present

## 2024-01-15 DIAGNOSIS — M5412 Radiculopathy, cervical region: Secondary | ICD-10-CM | POA: Diagnosis not present

## 2024-01-21 ENCOUNTER — Ambulatory Visit: Payer: Medicaid Other | Admitting: Podiatry

## 2024-01-25 ENCOUNTER — Other Ambulatory Visit: Payer: Self-pay | Admitting: Neurology

## 2024-01-31 ENCOUNTER — Encounter (HOSPITAL_BASED_OUTPATIENT_CLINIC_OR_DEPARTMENT_OTHER): Payer: Self-pay | Admitting: Emergency Medicine

## 2024-01-31 ENCOUNTER — Emergency Department (HOSPITAL_BASED_OUTPATIENT_CLINIC_OR_DEPARTMENT_OTHER)
Admission: EM | Admit: 2024-01-31 | Discharge: 2024-01-31 | Disposition: A | Discharge status: Home / Self Care | Payer: Medicaid Other | Attending: Emergency Medicine | Admitting: Emergency Medicine

## 2024-01-31 ENCOUNTER — Other Ambulatory Visit: Payer: Self-pay

## 2024-01-31 DIAGNOSIS — M791 Myalgia, unspecified site: Secondary | ICD-10-CM | POA: Insufficient documentation

## 2024-01-31 DIAGNOSIS — M62838 Other muscle spasm: Secondary | ICD-10-CM | POA: Insufficient documentation

## 2024-01-31 LAB — CBC WITH DIFFERENTIAL/PLATELET
Abs Immature Granulocytes: 0.06 10*3/uL (ref 0.00–0.07)
Basophils Absolute: 0.1 10*3/uL (ref 0.0–0.1)
Basophils Relative: 1 %
Eosinophils Absolute: 0.1 10*3/uL (ref 0.0–0.5)
Eosinophils Relative: 2 %
HCT: 37.8 % — ABNORMAL LOW (ref 39.0–52.0)
Hemoglobin: 13 g/dL (ref 13.0–17.0)
Immature Granulocytes: 1 %
Lymphocytes Relative: 22 %
Lymphs Abs: 1.9 10*3/uL (ref 0.7–4.0)
MCH: 30.5 pg (ref 26.0–34.0)
MCHC: 34.4 g/dL (ref 30.0–36.0)
MCV: 88.7 fL (ref 80.0–100.0)
Monocytes Absolute: 0.5 10*3/uL (ref 0.1–1.0)
Monocytes Relative: 6 %
Neutro Abs: 6.2 10*3/uL (ref 1.7–7.7)
Neutrophils Relative %: 68 %
Platelets: 311 10*3/uL (ref 150–400)
RBC: 4.26 MIL/uL (ref 4.22–5.81)
RDW: 12.7 % (ref 11.5–15.5)
WBC: 8.9 10*3/uL (ref 4.0–10.5)
nRBC: 0 % (ref 0.0–0.2)

## 2024-01-31 LAB — BASIC METABOLIC PANEL
Anion gap: 11 (ref 5–15)
BUN: 15 mg/dL (ref 6–20)
CO2: 26 mmol/L (ref 22–32)
Calcium: 9.2 mg/dL (ref 8.9–10.3)
Chloride: 99 mmol/L (ref 98–111)
Creatinine, Ser: 0.9 mg/dL (ref 0.61–1.24)
GFR, Estimated: >60 mL/min (ref >60)
Glucose, Bld: 114 mg/dL — ABNORMAL HIGH (ref 70–99)
Potassium: 4.1 mmol/L (ref 3.5–5.1)
Sodium: 136 mmol/L (ref 135–145)

## 2024-01-31 LAB — MAGNESIUM: Magnesium: 1.8 mg/dL (ref 1.7–2.4)

## 2024-01-31 MED ORDER — DIAZEPAM 5 MG/ML IJ SOLN
10.0000 mg | Freq: Once | INTRAMUSCULAR | Status: AC
  Administered 2024-01-31: 10 mg via INTRAVENOUS
  Filled 2024-01-31: qty 2
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
## 2024-01-31 NOTE — ED Provider Notes (Signed)
[facility] Provider Note   CSN: [id number] Arrival date & time: 01/31/24  1811     History Chief Complaint  Patient presents with   Spasms    HPI Carlos [name]. is a 53 y.o. male presenting for muscle pains. Hx of spasms 2/2 spinal cord injury. Took 2x flexeril today.  Patient's recorded medical, surgical, social, medication list and allergies were reviewed in the Snapshot window as part of the initial history.   Review of Systems   Review of Systems  Constitutional:  Negative for chills and fever.  HENT:  Negative for ear pain and sore throat.   Eyes:  Negative for pain and visual disturbance.  Respiratory:  Negative for cough and shortness of breath.   Cardiovascular:  Negative for chest pain and palpitations.  Gastrointestinal:  Negative for abdominal pain and vomiting.  Genitourinary:  Negative for dysuria and hematuria.  Musculoskeletal:  Negative for arthralgias and back pain.  Skin:  Negative for color change and rash.  Neurological:  Negative for seizures and syncope.  All other systems reviewed and are negative.   Physical Exam Updated Vital Signs BP (!) 141/96 (BP Location: Right Arm)   Pulse (!) 110   Temp 98.1 F (36.7 C) (Oral)   Resp 15   Ht 6\' 4"  (1.93 m)   Wt 71.7 kg   SpO2 100%   BMI 19.23 kg/m  Physical Exam Vitals and nursing note reviewed.  Constitutional:      General: He is not in acute distress.    Appearance: He is well-developed.  HENT:     Head: Normocephalic and atraumatic.  Eyes:     Conjunctiva/sclera: Conjunctivae normal.  Cardiovascular:     Rate and Rhythm: Normal rate and regular rhythm.     Heart sounds: No murmur heard. Pulmonary:     Effort: Pulmonary effort is normal. No respiratory distress.     Breath sounds: Normal breath sounds.  Abdominal:     Palpations: Abdomen is soft.     Tenderness: There is no abdominal tenderness.  Musculoskeletal:        General: No  swelling.     Cervical back: Neck supple.  Skin:    General: Skin is warm and dry.     Capillary Refill: Capillary refill takes less than 2 seconds.  Neurological:     Mental Status: He is alert.  Psychiatric:        Mood and Affect: Mood normal.      ED Course/ Medical Decision Making/ A&P    Procedures Procedures   Medications Ordered in ED Medications  diazepam (VALIUM) injection 10 mg (10 mg Intravenous Given 01/31/24 1913)    Medical Decision Making:   This is a 53 year old male with a history of spinal injury presenting with muscle spasms. Consider metabolic etiology.  Check magnesium and potassium both which are reassuring. On extensive conversation with the patient, it appears that he has stopped taking all of his muscle relaxers, all of his pain medication and all of his psychiatric medication over the last 3 weeks as he is trying to get off of all the medicines as he felt he was too somnolent during the day. Today the muscle spasms and pain are uncontrollable. History of present on this and physical exam findings are consistent with any acute pathology. Discussed management of his symptoms going forward.  IV Valium led to complete resolution and patient was resting comfortably on my serial assessment.  Strict return precautions recommended follow-up with his neurologist as well as primary care provider for ongoing medication education patient/family expressed understanding. Disposition:  I have considered need for hospitalization, however, considering all of the above, I believe this patient is stable for discharge at this time.  Patient/family educated about specific return precautions for given chief complaint and symptoms.  Patient/family educated about follow-up with PCP.     Patient/family expressed understanding of return precautions and need for follow-up. Patient spoken to regarding all imaging and laboratory results and appropriate follow up for these results.  All education provided in verbal form with additional information in written form. Time was allowed for answering of patient questions. Patient discharged.    Emergency Department Medication Summary:   Medications  diazepam (VALIUM) injection 10 mg (10 mg Intravenous Given 01/31/24 1913)        Clinical Impression:  1. Muscle spasm      Discharge   Final Clinical Impression(s) / ED Diagnoses Final diagnoses:  Muscle spasm    Rx / DC Orders ED Discharge Orders     None         Glyn Ade, MD 01/31/24 2034

## 2024-02-01 ENCOUNTER — Emergency Department (HOSPITAL_BASED_OUTPATIENT_CLINIC_OR_DEPARTMENT_OTHER)
Admission: EM | Admit: 2024-02-01 | Discharge: 2024-02-01 | Disposition: A | Discharge status: Home / Self Care | Payer: Medicaid Other | Attending: Emergency Medicine | Admitting: Emergency Medicine

## 2024-02-01 ENCOUNTER — Encounter (HOSPITAL_BASED_OUTPATIENT_CLINIC_OR_DEPARTMENT_OTHER): Payer: Self-pay | Admitting: Urology

## 2024-02-01 DIAGNOSIS — Z794 Long term (current) use of insulin: Secondary | ICD-10-CM | POA: Diagnosis not present

## 2024-02-01 DIAGNOSIS — Z87442 Personal history of urinary calculi: Secondary | ICD-10-CM | POA: Insufficient documentation

## 2024-02-01 DIAGNOSIS — M62838 Other muscle spasm: Secondary | ICD-10-CM | POA: Insufficient documentation

## 2024-02-01 DIAGNOSIS — E119 Type 2 diabetes mellitus without complications: Secondary | ICD-10-CM | POA: Diagnosis not present

## 2024-02-01 DIAGNOSIS — Z7982 Long term (current) use of aspirin: Secondary | ICD-10-CM | POA: Diagnosis not present

## 2024-02-01 MED ORDER — OXYCODONE-ACETAMINOPHEN 5-325 MG PO TABS: 1.0000 | ORAL_TABLET | Freq: Four times a day (QID) | ORAL | 0 refills | Status: DC | PRN

## 2024-02-01 MED ORDER — OXYCODONE-ACETAMINOPHEN 5-325 MG PO TABS
1.0000 | ORAL_TABLET | Freq: Once | ORAL | Status: AC
  Administered 2024-02-01: 1 via ORAL
  Filled 2024-02-01: qty 1

## 2024-02-01 MED ORDER — DIAZEPAM 5 MG PO TABS
5.0000 mg | ORAL_TABLET | Freq: Two times a day (BID) | ORAL | 0 refills | Status: DC | PRN
Start: 2024-02-01 — End: 2024-03-13

## 2024-02-01 MED ORDER — DIAZEPAM 5 MG PO TABS
5.0000 mg | ORAL_TABLET | Freq: Once | ORAL | Status: AC
  Administered 2024-02-01: 5 mg via ORAL
  Filled 2024-02-01: qty 1

## 2024-02-01 NOTE — Discharge Instructions (Signed)
Please follow-up with your neurologist regarding your pain regimen.  You were given a prescription for Valium and Percocet in the ER, it is unlikely that you will be able to get a refill of this medication if you return to the ER requesting it.   It was a pleasure caring for you today in the emergency department.  Please return to the emergency department for any worsening or worrisome symptoms.
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
## 2024-02-01 NOTE — ED Triage Notes (Signed)
Pt states was seen yesterday for same. Muscle spasms of whole body per pt  Was better after valium but got worse at 0300 today  States burning pain    H/o spinal cord injury

## 2024-02-02 ENCOUNTER — Encounter: Payer: Self-pay | Admitting: Family Medicine

## 2024-02-02 ENCOUNTER — Other Ambulatory Visit: Payer: Self-pay | Admitting: Family Medicine

## 2024-02-02 NOTE — Telephone Encounter (Signed)
Copied from CRM [id number]. Topic: Clinical - Medication Refill >> Feb 02, 2024  2:19 PM Gery Pray wrote: Most Recent Primary Care Visit:  Provider: Georganna Skeans  Department: [facility]  Visit Type: MYCHART VIDEO VISIT  Date: 12/26/2023  Medication: diazepam (VALIUM) 5 MG tablet  Has the patient contacted their pharmacy? Yes (Agent: If no, request that the patient contact the pharmacy for the refill. If patient does not wish to contact the pharmacy document the reason why and proceed with request.) (Agent: If yes, when and what did the pharmacy advise?)  Is this the correct pharmacy for this prescription? Yes If no, delete pharmacy and type the correct one.  This is the patient's preferred pharmacy:  [facility] [address] [place] - [address] [place] [address] Phone: [phone] Fax: [phone]  Has the prescription been filled recently? No  Is the patient out of the medication? Yes  Has the patient been seen for an appointment in the last year OR does the patient have an upcoming appointment? Yes  Can we respond through MyChart? Yes  Agent: Please be advised that Rx refills may take up to 3 business days. We ask that you follow-up with your pharmacy.

## 2024-02-03 ENCOUNTER — Ambulatory Visit: Payer: Medicaid Other | Admitting: Neurology

## 2024-02-03 ENCOUNTER — Other Ambulatory Visit: Payer: Medicaid Other

## 2024-02-04 ENCOUNTER — Ambulatory Visit
Admission: RE | Admit: 2024-02-04 | Discharge: 2024-02-04 | Disposition: A | Discharge status: Home / Self Care | Payer: Medicaid Other | Source: Ambulatory Visit | Attending: Neurology | Admitting: Neurology

## 2024-02-04 ENCOUNTER — Ambulatory Visit
Admission: RE | Admit: 2024-02-04 | Discharge: 2024-02-04 | Discharge status: Home / Self Care | Payer: Medicaid Other | Source: Ambulatory Visit | Attending: Neurology | Admitting: Neurology

## 2024-02-04 DIAGNOSIS — M62838 Other muscle spasm: Secondary | ICD-10-CM

## 2024-02-04 DIAGNOSIS — R269 Unspecified abnormalities of gait and mobility: Secondary | ICD-10-CM | POA: Diagnosis not present

## 2024-02-04 DIAGNOSIS — R252 Cramp and spasm: Secondary | ICD-10-CM

## 2024-02-06 ENCOUNTER — Encounter: Payer: Self-pay | Admitting: Neurology

## 2024-02-11 DIAGNOSIS — M961 Postlaminectomy syndrome, not elsewhere classified: Secondary | ICD-10-CM | POA: Diagnosis not present

## 2024-02-11 DIAGNOSIS — M546 Pain in thoracic spine: Secondary | ICD-10-CM | POA: Diagnosis not present

## 2024-02-11 DIAGNOSIS — G8929 Other chronic pain: Secondary | ICD-10-CM | POA: Diagnosis not present

## 2024-02-11 DIAGNOSIS — Z79891 Long term (current) use of opiate analgesic: Secondary | ICD-10-CM | POA: Diagnosis not present

## 2024-02-11 DIAGNOSIS — M545 Low back pain, unspecified: Secondary | ICD-10-CM | POA: Diagnosis not present

## 2024-02-11 DIAGNOSIS — R252 Cramp and spasm: Secondary | ICD-10-CM | POA: Diagnosis not present

## 2024-02-11 DIAGNOSIS — M5412 Radiculopathy, cervical region: Secondary | ICD-10-CM | POA: Diagnosis not present

## 2024-02-11 DIAGNOSIS — S14109S Unspecified injury at unspecified level of cervical spinal cord, sequela: Secondary | ICD-10-CM | POA: Diagnosis not present

## 2024-02-11 DIAGNOSIS — M542 Cervicalgia: Secondary | ICD-10-CM | POA: Diagnosis not present

## 2024-02-13 ENCOUNTER — Encounter: Payer: Medicaid Other | Admitting: Neurology

## 2024-02-13 ENCOUNTER — Encounter: Payer: Self-pay | Admitting: Neurology

## 2024-02-14 ENCOUNTER — Other Ambulatory Visit: Payer: Self-pay | Admitting: Family Medicine

## 2024-02-16 ENCOUNTER — Ambulatory Visit: Payer: Medicaid Other | Admitting: Podiatry

## 2024-02-16 ENCOUNTER — Encounter: Payer: Self-pay | Admitting: Podiatry

## 2024-02-16 ENCOUNTER — Ambulatory Visit (INDEPENDENT_AMBULATORY_CARE_PROVIDER_SITE_OTHER)

## 2024-02-16 DIAGNOSIS — M2041 Other hammer toe(s) (acquired), right foot: Secondary | ICD-10-CM | POA: Diagnosis not present

## 2024-02-16 DIAGNOSIS — M79672 Pain in left foot: Secondary | ICD-10-CM

## 2024-02-16 DIAGNOSIS — E1149 Type 2 diabetes mellitus with other diabetic neurological complication: Secondary | ICD-10-CM | POA: Diagnosis not present

## 2024-02-16 DIAGNOSIS — M79671 Pain in right foot: Secondary | ICD-10-CM

## 2024-02-16 DIAGNOSIS — B351 Tinea unguium: Secondary | ICD-10-CM | POA: Diagnosis not present

## 2024-02-16 DIAGNOSIS — B49 Unspecified mycosis: Secondary | ICD-10-CM

## 2024-02-16 DIAGNOSIS — M2042 Other hammer toe(s) (acquired), left foot: Secondary | ICD-10-CM

## 2024-02-16 NOTE — Telephone Encounter (Signed)
 Called pt and r/s for 03/17/24, added to wait list.
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
## 2024-02-17 NOTE — Telephone Encounter (Signed)
 Left vm for patient on 02/05/24

## 2024-02-19 ENCOUNTER — Encounter: Payer: Self-pay | Admitting: Endocrinology

## 2024-02-19 ENCOUNTER — Other Ambulatory Visit: Payer: Self-pay

## 2024-02-19 ENCOUNTER — Ambulatory Visit: Payer: Medicaid Other | Admitting: Endocrinology

## 2024-02-19 VITALS — BP 124/68 | HR 81 | Ht 76.0 in | Wt 156.0 lb

## 2024-02-19 DIAGNOSIS — Z794 Long term (current) use of insulin: Secondary | ICD-10-CM | POA: Diagnosis not present

## 2024-02-19 DIAGNOSIS — E119 Type 2 diabetes mellitus without complications: Secondary | ICD-10-CM

## 2024-02-19 LAB — POCT GLYCOSYLATED HEMOGLOBIN (HGB A1C): Hemoglobin A1C: 7 % — AB (ref 4.0–5.6)
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
## 2024-02-20 ENCOUNTER — Encounter: Payer: Self-pay | Admitting: Endocrinology

## 2024-02-29 ENCOUNTER — Emergency Department (HOSPITAL_BASED_OUTPATIENT_CLINIC_OR_DEPARTMENT_OTHER)
Admission: EM | Admit: 2024-02-29 | Discharge: 2024-02-29 | Disposition: A | Discharge status: Home / Self Care | Attending: Emergency Medicine | Admitting: Emergency Medicine

## 2024-02-29 ENCOUNTER — Encounter (HOSPITAL_BASED_OUTPATIENT_CLINIC_OR_DEPARTMENT_OTHER): Payer: Self-pay | Admitting: Emergency Medicine

## 2024-02-29 DIAGNOSIS — R339 Retention of urine, unspecified: Secondary | ICD-10-CM | POA: Diagnosis present

## 2024-02-29 DIAGNOSIS — M62830 Muscle spasm of back: Secondary | ICD-10-CM | POA: Insufficient documentation

## 2024-02-29 LAB — BASIC METABOLIC PANEL
Anion gap: 10 (ref 5–15)
BUN: 17 mg/dL (ref 6–20)
CO2: 27 mmol/L (ref 22–32)
Calcium: 9.2 mg/dL (ref 8.9–10.3)
Chloride: 101 mmol/L (ref 98–111)
Creatinine, Ser: 0.58 mg/dL — ABNORMAL LOW (ref 0.61–1.24)
GFR, Estimated: >60 mL/min (ref >60)
Glucose, Bld: 144 mg/dL — ABNORMAL HIGH (ref 70–99)
Potassium: 3.3 mmol/L — ABNORMAL LOW (ref 3.5–5.1)
Sodium: 138 mmol/L (ref 135–145)

## 2024-02-29 LAB — CBC WITH DIFFERENTIAL/PLATELET
Abs Immature Granulocytes: 0.02 10*3/uL (ref 0.00–0.07)
Basophils Absolute: 0 10*3/uL (ref 0.0–0.1)
Basophils Relative: 1 %
Eosinophils Absolute: 0.4 10*3/uL (ref 0.0–0.5)
Eosinophils Relative: 6 %
HCT: 38.2 % — ABNORMAL LOW (ref 39.0–52.0)
Hemoglobin: 13 g/dL (ref 13.0–17.0)
Immature Granulocytes: 0 %
Lymphocytes Relative: 31 %
Lymphs Abs: 2 10*3/uL (ref 0.7–4.0)
MCH: 30.3 pg (ref 26.0–34.0)
MCHC: 34 g/dL (ref 30.0–36.0)
MCV: 89 fL (ref 80.0–100.0)
Monocytes Absolute: 0.4 10*3/uL (ref 0.1–1.0)
Monocytes Relative: 6 %
Neutro Abs: 3.7 10*3/uL (ref 1.7–7.7)
Neutrophils Relative %: 56 %
Platelets: 296 10*3/uL (ref 150–400)
RBC: 4.29 MIL/uL (ref 4.22–5.81)
RDW: 12.9 % (ref 11.5–15.5)
WBC: 6.5 10*3/uL (ref 4.0–10.5)
nRBC: 0 % (ref 0.0–0.2)

## 2024-02-29 LAB — URINALYSIS, ROUTINE W REFLEX MICROSCOPIC
Bilirubin Urine: NEGATIVE
Glucose, UA: 100 mg/dL — AB
Hgb urine dipstick: NEGATIVE
Ketones, ur: NEGATIVE mg/dL
Leukocytes,Ua: NEGATIVE
Nitrite: NEGATIVE
Protein, ur: NEGATIVE mg/dL
Specific Gravity, Urine: 1.025 (ref 1.005–1.030)
pH: 8.5 — ABNORMAL HIGH (ref 5.0–8.0)

## 2024-02-29 MED ORDER — OXYCODONE HCL 5 MG PO TABS
5.0000 mg | ORAL_TABLET | Freq: Once | ORAL | Status: AC
  Administered 2024-02-29: 5 mg via ORAL
  Filled 2024-02-29: qty 1

## 2024-02-29 MED ORDER — LIDOCAINE HCL URETHRAL/MUCOSAL 2 % EX GEL
1.0000 | Freq: Once | CUTANEOUS | Status: AC
  Administered 2024-02-29: 1 via URETHRAL
  Filled 2024-02-29: qty 11

## 2024-02-29 MED ORDER — DIAZEPAM 5 MG/ML IJ SOLN
5.0000 mg | Freq: Once | INTRAMUSCULAR | Status: AC
  Administered 2024-02-29: 5 mg via INTRAVENOUS
  Filled 2024-02-29: qty 2

## 2024-02-29 NOTE — ED Triage Notes (Signed)
 Mid back muscles spasms that extend into all 4 extremities. Reports h/o same due to spinal cord injury and seen for same in past. When he was last seen, valium and pain medication resolved his sx. Patient also reports urinary retention. Last urinated 8 pm last night. Per pt, no issues with urinary retention in past.

## 2024-02-29 NOTE — ED Provider Notes (Signed)
 [place] [facility] Provider Note   CSN: [id number] Arrival date & time: 02/29/24  1816     History Chief Complaint  Patient presents with   Spasms   Urinary Retention    HPI Carlos [name]. is a 53 y.o. male presenting for chief complaint of urinary retention.  He is a 53 year old male.  States that he has been having worsening muscle spasms over the last few weeks.  Seen twice last month for similar.  However the main thing of brings him in today is recurrent urinary retention.  States he suffered with this while he was in the hospital.  Plan to follow-up with urology later this week.  However today he has not been able to make any urine. Feels like he has to and has spent significant time on the toilet without success.  Denies fevers chills nausea vomiting syncope shortness of breath..   Patient's recorded medical, surgical, social, medication list and allergies were reviewed in the Snapshot window as part of the initial history.   Review of Systems   Review of Systems  Constitutional:  Negative for chills and fever.  HENT:  Negative for ear pain and sore throat.   Eyes:  Negative for pain and visual disturbance.  Respiratory:  Negative for cough and shortness of breath.   Cardiovascular:  Negative for chest pain and palpitations.  Gastrointestinal:  Negative for abdominal pain and vomiting.  Genitourinary:  Positive for difficulty urinating. Negative for dysuria and hematuria.  Musculoskeletal:  Positive for myalgias. Negative for arthralgias and back pain.  Skin:  Negative for color change and rash.  Neurological:  Negative for seizures and syncope.  All other systems reviewed and are negative.   Physical Exam Updated Vital Signs BP 108/81 (BP Location: Left Arm)   Pulse 98   Temp 98.1 F (36.7 C)   Resp 18   Ht 6\' 4"  (1.93 m)   Wt 72 kg   SpO2 100%   BMI 19.32 kg/m  Physical Exam Vitals and nursing note reviewed.   Constitutional:      General: He is not in acute distress.    Appearance: He is well-developed.  HENT:     Head: Normocephalic and atraumatic.  Eyes:     Conjunctiva/sclera: Conjunctivae normal.  Cardiovascular:     Rate and Rhythm: Normal rate and regular rhythm.     Heart sounds: No murmur heard. Pulmonary:     Effort: Pulmonary effort is normal. No respiratory distress.     Breath sounds: Normal breath sounds.  Abdominal:     Palpations: Abdomen is soft.     Tenderness: There is no abdominal tenderness.  Musculoskeletal:        General: No swelling.     Cervical back: Neck supple.  Skin:    General: Skin is warm and dry.     Capillary Refill: Capillary refill takes less than 2 seconds.  Neurological:     Mental Status: He is alert.  Psychiatric:        Mood and Affect: Mood normal.      ED Course/ Medical Decision Making/ A&P    Procedures Procedures   Medications Ordered in ED Medications  lidocaine (XYLOCAINE) 2 % jelly 1 Application (1 Application Urethral Given 02/29/24 1947)  diazepam (VALIUM) injection 5 mg (5 mg Intravenous Given 02/29/24 1948)  oxyCODONE (Oxy IR/ROXICODONE) immediate release tablet 5 mg (5 mg Oral Given 02/29/24 1949)  oxyCODONE (Oxy IR/ROXICODONE) immediate release  tablet 5 mg (5 mg Oral Given 02/29/24 2214)    Medical Decision Making:   This is a 53 year old male presenting with acute urinary retention.  My point-of-care ultrasound showed a postvoid residual of 255 cc.  Patient states he could not get any more urine out. This likely represents an obstructive process. Foley catheter placed, lab work performed without evidence of any acute obstructive uropathy. Recommended keeping Foley catheter in for 5 days and following with urology as scheduled. Patient refused.  Stated that he wanted the catheter out tonight. Convince him to leave it in for at least 3 hours to hopefully promote some smooth muscle relaxation and patient was adamant on it  being removed. We discussed recurrence of disease, potential postobstructive diuresis and need for returning to the emergency room if he was unable to urinate. He stated he would take his Flomax and follow-up with urology in outpatient setting and expressed risk of failure of outpatient care. Should return precautions reinforced and patient expressed understanding. Disposition:  Patient is requesting discharge at this time.  Given patient's understanding of risk of severe missed diagnosis based on limitations of today's evaluation and risk of interval worsening of disease including life or limb threatening pathology, will participate in shared medical decision making and patient directed discharge at this time.  Patient is welcome to return for further diagnostic evaluation/therapeutic management at any time.  Clinical Impression:  1. Urinary retention      Discharge   Final Clinical Impression(s) / ED Diagnoses Final diagnoses:  Urinary retention    Rx / DC Orders ED Discharge Orders     None         Glyn Ade, MD 02/29/24 2223

## 2024-03-03 ENCOUNTER — Other Ambulatory Visit: Payer: Self-pay | Admitting: Family Medicine

## 2024-03-04 MED ORDER — DOXEPIN HCL 50 MG PO CAPS: 50.0000 mg | ORAL_CAPSULE | Freq: Every evening | ORAL | 0 refills | Status: DC | PRN

## 2024-03-09 ENCOUNTER — Ambulatory Visit (INDEPENDENT_AMBULATORY_CARE_PROVIDER_SITE_OTHER): Admitting: Physician Assistant

## 2024-03-09 ENCOUNTER — Telehealth: Payer: Self-pay

## 2024-03-09 ENCOUNTER — Other Ambulatory Visit (HOSPITAL_COMMUNITY): Payer: Self-pay

## 2024-03-09 VITALS — BP 127/77 | HR 118 | Ht 76.0 in | Wt 160.0 lb

## 2024-03-09 DIAGNOSIS — R3911 Hesitancy of micturition: Secondary | ICD-10-CM | POA: Diagnosis not present

## 2024-03-09 DIAGNOSIS — R350 Frequency of micturition: Secondary | ICD-10-CM | POA: Diagnosis not present

## 2024-03-09 DIAGNOSIS — E291 Testicular hypofunction: Secondary | ICD-10-CM | POA: Diagnosis not present

## 2024-03-09 LAB — BLADDER SCAN AMB NON-IMAGING: Scan Result: 162

## 2024-03-09 MED ORDER — TAMSULOSIN HCL 0.4 MG PO CAPS: 0.8000 mg | ORAL_CAPSULE | Freq: Every day | ORAL | 3 refills | Status: AC

## 2024-03-09 NOTE — Progress Notes (Signed)
 03/09/2024 4:23 PM   Carlos Sprinkles Jr. Apr 12, 1971 [id number]  CC: Chief Complaint  Patient presents with   Benign Prostatic Hypertrophy   HPI: Carlos Blank. is a 53 y.o. male with PMH diabetes, nephrolithiasis, hypogonadism, ED on sildenafil, BPH on Flomax 0.4mg , and partial spinal cord injury unable to void on urodynamics who presents today for ED follow up of urinary retention.   He presented to the ED at [facility] 9 days ago with reports of the inability to void.  PVR was 255 mL and catheter was placed.  He requested Foley removal prior to discharge.  Today he reports chronic urinary hesitancy lasting 30-120 seconds, occurring nearly every time he voids. On the night he presented to the ED, he was unable to void all night, which caused him concern. He is now back to his baseline voiding symptoms. He remains on Flomax 0.4mg .  PVR 162 mL.  Additionally, he reports that he stopped TRT.  He states he initially started it primarily to help with his low libido, however he did not have any change in libido with therapy, so he did not see the point in continuing it.  PMH: Past Medical History:  Diagnosis Date   ADHD (attention deficit hyperactivity disorder)    Alcohol abuse    Alcohol abuse 12/23/2012   Depression    Diabetes mellitus without complication (HCC)    History of exercise stress test    03-18-2013--  normal   History of kidney stones    Hyperlipidemia    Kidney stones    Left ureteral stone    Spasticity    Type 2 diabetes mellitus [facility])     Surgical History: Past Surgical History:  Procedure Laterality Date   ANTERIOR CERVICAL DECOMP/DISCECTOMY FUSION N/A 08/30/2021   Procedure: CERVICAL FIVE-SIX ANTERIOR CERVICAL DECOMPRESSION/DISCECTOMY FUSION;  Surgeon: Bedelia Person, MD;  Location: [facility] OR;  Service: Neurosurgery;  Laterality: N/A;   CARDIAC CATHETERIZATION  11-27-2006  dr Reyes Ivan   non-obstructive CAD/  20% proximal and mid LAD/   perserved LVF,  ef 55-60%   CYSTOSCOPY W/ RETROGRADES Left 05/17/2015   Procedure: CYSTOSCOPY WITH RETROGRADE PYELOGRAM;  Surgeon: Jerilee Field, MD;  Location: [facility];  Service: Urology;  Laterality: Left;   CYSTOSCOPY/URETEROSCOPY/HOLMIUM LASER/STENT PLACEMENT Left 05/17/2015   Procedure: LEFT URETEROSCOPY/HOLMIUM LASER/STENT PLACEMENT;  Surgeon: Jerilee Field, MD;  Location: [facility];  Service: Urology;  Laterality: Left;   EXTRACORPOREAL SHOCK WAVE LITHOTRIPSY Left 05-08-2015   EXTRACTION RIGHT MANDIBULAR , PREMOLAR/  MAXILLARY MANDIBULAR FIXATION WITH SCREWS  08-03-2008   LUMBAR PERCUTANEOUS PEDICLE SCREW 4 LEVEL N/A 09/04/2021   Procedure: Thoracic Four-Thoracic Eight  Percutaneous Instrumented Fusion;  Surgeon: Bedelia Person, MD;  Location: [facility] OR;  Service: Neurosurgery;  Laterality: N/A;   ORIF FOUR HOLD PLATE AND MAXILLOMANDIBULAR FIXATION W/ ARCH BARS  08-05-2008   REMOVAL ARCH BARS 09-15-2008   TRANSTHORACIC ECHOCARDIOGRAM  04-06-2008  dr Reyes Ivan   normal LVF,  ef 55-60%,  trivial MR and TR    Home Medications:  Allergies as of 03/09/2024   No Known Allergies      Medication List        Accurate as of March 09, 2024  4:23 PM. If you have any questions, ask your nurse or doctor.          STOP taking these medications    2-3CC SYRINGE 3 ML Misc Stopped by: Carlos [name]   BD Eclipse Needle  21G X 1-1/2" Misc Generic drug: NEEDLE (DISP) 21 G Stopped by: Carlos [name]   BD Eclipse Shielded Needle 18G X 1-1/2" Misc Generic drug: NEEDLE (DISP) 18 G Stopped by: Carlos [name]   testosterone cypionate 200 MG/ML injection Commonly known as: DEPOTESTOSTERONE CYPIONATE Stopped by: Carlos [name]       TAKE these medications    acetaminophen 325 MG tablet Commonly known as: TYLENOL Take 1-2 tablets (325-650 mg total) by mouth every 4 (four) hours as needed for mild pain.   aspirin EC 81 MG  tablet Take 1 tablet (81 mg total) by mouth daily. Swallow whole.   atorvastatin 10 MG tablet Commonly known as: LIPITOR Take 1 tablet (10 mg total) by mouth daily.   baclofen 20 MG tablet Commonly known as: LIORESAL Take 2 tablets (40 mg total) by mouth 3 (three) times daily. For spasticity   cyclobenzaprine 10 MG tablet Commonly known as: FLEXERIL TAKE 1 TABLET BY MOUTH TWICE A DAY AS NEEDED FOR MUSCLE SPASMS   diazepam 5 MG tablet Commonly known as: VALIUM Take 1 tablet (5 mg total) by mouth every 12 (twelve) hours as needed for muscle spasms.   docusate sodium 100 MG capsule Commonly known as: COLACE Take 100 mg by mouth 2 (two) times daily.   doxepin 150 MG capsule Commonly known as: SINEQUAN TAKE 1 CAPSULE BY MOUTH AT BEDTIME.   doxepin 50 MG capsule Commonly known as: SINEQUAN Take 1 capsule (50 mg total) by mouth at bedtime as needed.   DULoxetine 60 MG capsule Commonly known as: CYMBALTA TAKE 1 CAPSULE BY MOUTH EVERY DAY   FreeStyle Libre 3 Plus Sensor Misc Inject 1 Device into the skin continuous. Change every 15 days   Gvoke HypoPen 1-Pack 1 MG/0.2ML Soaj Generic drug: Glucagon Inject 1 mg into the skin as needed (low blood sugar with impaired consciousness).   insulin lispro 100 UNIT/ML KwikPen Commonly known as: HumaLOG KwikPen Take 1-3 units  BEFORE each meal 3 times a day.   Insulin Pen Needle 31G X 5 MM Misc With pen   Lantus SoloStar 100 UNIT/ML Solostar Pen Generic drug: insulin glargine Inject 15 Units into the skin daily.   metFORMIN 1000 MG tablet Commonly known as: GLUCOPHAGE TAKE 1 TABLET (1,000 MG TOTAL) BY MOUTH TWICE A DAY WITH FOOD   mirtazapine 15 MG tablet Commonly known as: REMERON TAKE 1 TABLET BY MOUTH DAILY AT  BEDTIME   multivitamin with minerals Tabs tablet Take 1 tablet by mouth daily.   naloxone 4 MG/0.1ML Liqd nasal spray kit Commonly known as: NARCAN   ondansetron 4 MG disintegrating tablet Commonly known as:  ZOFRAN-ODT Take 1 tablet (4 mg total) by mouth every 8 (eight) hours as needed.   sildenafil 100 MG tablet Commonly known as: Viagra Take 0.5-1 tablets (50-100 mg total) by mouth daily as needed for erectile dysfunction.   tamsulosin 0.4 MG Caps capsule Commonly known as: FLOMAX Take 2 capsules (0.8 mg total) by mouth daily. What changed: how much to take Changed by: Carlos [name]   Vitamin B Complex Tabs Take1 tablet by mouth daily.        Allergies:  No Known Allergies  Family History: Family History  Problem Relation Age of Onset   Cancer Mother    Diabetes Mother        type 1   Heart disease Mother        pacemaker   Cancer - Colon Mother    Alcohol abuse Father  Alcohol abuse Maternal Uncle    Diabetes Maternal Grandmother    Alcohol abuse Maternal Grandfather    Diabetes Maternal Grandfather    Prostate cancer Neg Hx     Social History:   reports that he has never smoked. He has never used smokeless tobacco. He reports current alcohol use of about 3.0 standard drinks of alcohol per week. He reports that he does not use drugs.  Physical Exam: BP 127/77   Pulse (!) 118   Ht 6\' 4"  (1.93 m)   Wt 160 lb (72.6 kg)   BMI 19.48 kg/m   Constitutional:  Alert and oriented, no acute distress, nontoxic appearing HEENT: [place], AT Cardiovascular: No clubbing, cyanosis, or edema Respiratory: Normal respiratory effort, no increased work of breathing Skin: No rashes, bruises or suspicious lesions Neurologic: Grossly intact, no focal deficits, moving all 4 extremities Psychiatric: Normal mood and affect  Laboratory Data: Results for orders placed or performed in visit on 03/09/24  Bladder Scan (Post Void Residual) in office   Collection Time: 03/09/24  3:28 PM  Result Value Ref Range   Scan Result 162    Assessment & Plan:   1. Urinary hesitancy (Primary) PVR is slightly elevated today, though not in overt retention.  He reports chronic urinary hesitancy  at baseline, with etiologies including BPH versus neurogenic bladder.  I offered him [facility] teaching in case he develops urinary retention overnight or on the weekend, but he prefers to defer this at this time.  We discussed maximizing pharmacotherapy with Flomax 0.8 mg daily and he would like to try this first. - Bladder Scan (Post Void Residual) in office - tamsulosin (FLOMAX) 0.4 MG CAPS capsule; Take 2 capsules (0.8 mg total) by mouth daily.  Dispense: 180 capsule; Refill: 3  2. Hypogonadism in male No improvement in libido on TRT, okay to discontinue.  Return in about 6 months (around 09/09/2024) for IPSS, PVR.  Carlos Ching, PA-C  [facility] [place] [address], [address] [place], [place] [address] [phone]

## 2024-03-09 NOTE — Telephone Encounter (Signed)
 PA needed for [facility] 3 plus per patient

## 2024-03-09 NOTE — Telephone Encounter (Signed)
 Pharmacy Patient Advocate Encounter   Received notification from Pt Calls Messages that prior authorization for Freestyle libre 3 plus is required/requested.   Insurance verification completed.   The patient is insured through [employer] .   Per test claim: Medication is not eligible for pharmacy benefits and must be billed through medical insurance. As our team only handles pharmacy related prior auths, medical PA's must be submitted by the clinic. Thank you

## 2024-03-10 ENCOUNTER — Other Ambulatory Visit: Payer: Self-pay | Admitting: Family Medicine

## 2024-03-10 DIAGNOSIS — R3911 Hesitancy of micturition: Secondary | ICD-10-CM

## 2024-03-12 ENCOUNTER — Emergency Department (HOSPITAL_BASED_OUTPATIENT_CLINIC_OR_DEPARTMENT_OTHER)
Admission: EM | Admit: 2024-03-12 | Discharge: 2024-03-12 | Discharge status: Against Medical Advice | Attending: Emergency Medicine | Admitting: Emergency Medicine

## 2024-03-12 ENCOUNTER — Other Ambulatory Visit: Payer: Self-pay

## 2024-03-12 ENCOUNTER — Encounter (HOSPITAL_BASED_OUTPATIENT_CLINIC_OR_DEPARTMENT_OTHER): Payer: Self-pay | Admitting: Emergency Medicine

## 2024-03-12 DIAGNOSIS — M62838 Other muscle spasm: Secondary | ICD-10-CM | POA: Insufficient documentation

## 2024-03-12 DIAGNOSIS — Z5321 Procedure and treatment not carried out due to patient leaving prior to being seen by health care provider: Secondary | ICD-10-CM | POA: Diagnosis not present

## 2024-03-12 NOTE — ED Triage Notes (Signed)
 Pt reports hx of spinal cord injury w spacixity, has Neuro appt next week, having trouble tolerating the pain; reports spasms that radiate down the back

## 2024-03-13 ENCOUNTER — Emergency Department (HOSPITAL_BASED_OUTPATIENT_CLINIC_OR_DEPARTMENT_OTHER): Admission: EM | Admit: 2024-03-13 | Discharge: 2024-03-13 | Disposition: A | Discharge status: Home / Self Care

## 2024-03-13 ENCOUNTER — Encounter (HOSPITAL_BASED_OUTPATIENT_CLINIC_OR_DEPARTMENT_OTHER): Payer: Self-pay

## 2024-03-13 DIAGNOSIS — Z7982 Long term (current) use of aspirin: Secondary | ICD-10-CM | POA: Insufficient documentation

## 2024-03-13 DIAGNOSIS — G8929 Other chronic pain: Secondary | ICD-10-CM | POA: Insufficient documentation

## 2024-03-13 DIAGNOSIS — M545 Low back pain, unspecified: Secondary | ICD-10-CM | POA: Diagnosis present

## 2024-03-13 DIAGNOSIS — Z794 Long term (current) use of insulin: Secondary | ICD-10-CM | POA: Insufficient documentation

## 2024-03-13 MED ORDER — LIDOCAINE 5 % EX PTCH
1.0000 | MEDICATED_PATCH | Freq: Once | CUTANEOUS | Status: DC
Start: 2024-03-13 — End: 2024-03-13
  Administered 2024-03-13: 1 via TRANSDERMAL
  Filled 2024-03-13: qty 1

## 2024-03-13 MED ORDER — DIAZEPAM 5 MG PO TABS: 5.0000 mg | ORAL_TABLET | Freq: Two times a day (BID) | ORAL | 0 refills | Status: AC | PRN

## 2024-03-13 MED ORDER — OXYCODONE-ACETAMINOPHEN 5-325 MG PO TABS
1.0000 | ORAL_TABLET | Freq: Once | ORAL | Status: AC
  Administered 2024-03-13: 1 via ORAL
  Filled 2024-03-13: qty 1

## 2024-03-13 MED ORDER — ACETAMINOPHEN 325 MG PO TABS
650.0000 mg | ORAL_TABLET | Freq: Once | ORAL | Status: AC
  Administered 2024-03-13: 650 mg via ORAL
  Filled 2024-03-13: qty 2

## 2024-03-13 MED ORDER — LIDOCAINE 4 % EX PTCH: 1.0000 | MEDICATED_PATCH | CUTANEOUS | 0 refills | Status: DC

## 2024-03-13 MED ORDER — KETOROLAC TROMETHAMINE 15 MG/ML IJ SOLN
15.0000 mg | Freq: Once | INTRAMUSCULAR | Status: AC
  Administered 2024-03-13: 15 mg via INTRAMUSCULAR
  Filled 2024-03-13: qty 1

## 2024-03-13 MED ORDER — DIAZEPAM 2 MG PO TABS
2.0000 mg | ORAL_TABLET | Freq: Once | ORAL | Status: AC
  Administered 2024-03-13: 2 mg via ORAL
  Filled 2024-03-13: qty 1

## 2024-03-13 MED ORDER — DEXAMETHASONE 4 MG PO TABS
4.0000 mg | ORAL_TABLET | Freq: Once | ORAL | Status: AC
  Administered 2024-03-13: 4 mg via ORAL
  Filled 2024-03-13: qty 1

## 2024-03-13 NOTE — Discharge Instructions (Addendum)
 Do not drive while taking Valium.  Please follow-up with your primary doctors.  Return immediately felt fevers, chills, severe pain, weakness in your legs, numbness in your genital area, difficulty urinating or you develop any new or worsening symptoms that are concerning to you.

## 2024-03-13 NOTE — ED Provider Notes (Signed)
 [facility] Provider Note   CSN: [id number] Arrival date & time: 03/13/24  2130     History  Chief Complaint  Patient presents with   Back Pain    Carlos [name]. is a 53 y.o. male.  53 year old with acute on chronic back pain.  Symptoms ongoing for the past several days.  Center of back radiates down both legs.  No numbness weakness, saddle anesthesia, no difficulty urinating.  No recent traumas.  No fevers.     Back Pain      Home Medications Prior to Admission medications   Medication Sig Start Date End Date Taking? Authorizing Provider  diazepam (VALIUM) 5 MG tablet Take 1 tablet (5 mg total) by mouth every 12 (twelve) hours as needed for up to 6 days for anxiety. 03/13/24 03/19/24 Yes Ruchy Wildrick, Feliz Beam J, DO  lidocaine 4 % Place 1 patch onto the skin daily. 03/13/24  Yes Coral Spikes, DO  acetaminophen (TYLENOL) 325 MG tablet Take 1-2 tablets (325-650 mg total) by mouth every 4 (four) hours as needed for mild pain. 10/03/21   Angiulli, Mcarthur Rossetti, PA-C  aspirin EC 81 MG EC tablet Take 1 tablet (81 mg total) by mouth daily. Swallow whole. 10/03/21   Angiulli, Mcarthur Rossetti, PA-C  atorvastatin (LIPITOR) 10 MG tablet Take 1 tablet (10 mg total) by mouth daily. 06/13/23   Georganna Skeans, MD  B Complex Vitamins (VITAMIN B COMPLEX) TABS Take1 tablet by mouth daily. 10/03/21   Angiulli, Mcarthur Rossetti, PA-C  baclofen (LIORESAL) 20 MG tablet Take 2 tablets (40 mg total) by mouth 3 (three) times daily. For spasticity 03/11/23   Georganna Skeans, MD  Continuous Glucose Sensor (FREESTYLE LIBRE 3 PLUS SENSOR) MISC Inject 1 Device into the skin continuous. Change every 15 days 10/24/23   Thapa, [place], MD  cyclobenzaprine (FLEXERIL) 10 MG tablet TAKE 1 TABLET BY MOUTH TWICE A DAY AS NEEDED FOR MUSCLE SPASMS 02/16/24   Georganna Skeans, MD  docusate sodium (COLACE) 100 MG capsule Take 100 mg by mouth 2 (two) times daily.    [provider]  doxepin  (SINEQUAN) 150 MG capsule TAKE 1 CAPSULE BY MOUTH AT BEDTIME. 12/25/23   Georganna Skeans, MD  doxepin (SINEQUAN) 50 MG capsule Take 1 capsule (50 mg total) by mouth at bedtime as needed. 03/04/24   Georganna Skeans, MD  DULoxetine (CYMBALTA) 60 MG capsule TAKE 1 CAPSULE BY MOUTH EVERY DAY 01/26/24   Levert Feinstein, MD  Glucagon (GVOKE HYPOPEN 1-PACK) 1 MG/0.2ML SOAJ Inject 1 mg into the skin as needed (low blood sugar with impaired consciousness). 11/18/23   Thapa, [place], MD  insulin glargine (LANTUS SOLOSTAR) 100 UNIT/ML Solostar Pen Inject 15 Units into the skin daily. 08/21/23   Thapa, [place], MD  insulin lispro (HUMALOG KWIKPEN) 100 UNIT/ML KwikPen Take 1-3 units  BEFORE each meal 3 times a day. 08/21/23   Thapa, [place], MD  Insulin Pen Needle 31G X 5 MM MISC With pen 07/09/23   Reather Littler, MD  metFORMIN (GLUCOPHAGE) 1000 MG tablet TAKE 1 TABLET (1,000 MG TOTAL) BY MOUTH TWICE A DAY WITH FOOD 12/15/23   Thapa, [place], MD  mirtazapine (REMERON) 15 MG tablet TAKE 1 TABLET BY MOUTH DAILY AT  BEDTIME 11/17/23   Georganna Skeans, MD  Multiple Vitamin (MULTIVITAMIN WITH MINERALS) TABS tablet Take 1 tablet by mouth daily. 10/03/21   Angiulli, Mcarthur Rossetti, PA-C  naloxone [facility]) nasal spray 4 mg/0.1 mL     [provider]  ondansetron (ZOFRAN-ODT) 4 MG disintegrating tablet Take 1 tablet (4 mg total) by mouth every 8 (eight) hours as needed. 09/23/23   Tomi Bamberger, PA-C  sildenafil (VIAGRA) 100 MG tablet Take 0.5-1 tablets (50-100 mg total) by mouth daily as needed for erectile dysfunction. 11/10/23   Georganna Skeans, MD  tamsulosin (FLOMAX) 0.4 MG CAPS capsule Take 2 capsules (0.8 mg total) by mouth daily. 03/09/24   Carman Ching, PA-C      Allergies    Patient has no known allergies.    Review of Systems   Review of Systems  Musculoskeletal:  Positive for back pain.    Physical Exam Updated Vital Signs BP (!) 135/100 (BP Location: Left Arm)   Pulse (!) 112   Temp 98 F (36.7 C) (Oral)   Resp  18   Wt 72.5 kg   SpO2 100%   BMI 19.46 kg/m  Physical Exam Vitals and nursing note reviewed.  Constitutional:      General: He is not in acute distress.    Appearance: He is not toxic-appearing.  HENT:     Head: Normocephalic.     Nose: Nose normal.     Mouth/Throat:     Mouth: Mucous membranes are moist.  Eyes:     Conjunctiva/sclera: Conjunctivae normal.  Cardiovascular:     Rate and Rhythm: Normal rate.  Abdominal:     General: Abdomen is flat. There is no distension.     Tenderness: There is no abdominal tenderness. There is no guarding or rebound.  Musculoskeletal:     Comments: 5 out of 5 plantarflexion dorsiflexion, hip flexion extension.  Ambulating with steady gait.  Normal sensation in lower extremities.  Neurological:     General: No focal deficit present.     Mental Status: He is alert.  Psychiatric:        Mood and Affect: Mood normal.        Behavior: Behavior normal.     ED Results / Procedures / Treatments   Labs (all labs ordered are listed, but only abnormal results are displayed) Labs Reviewed - No data to display  EKG None  Radiology No results found.  Procedures Procedures    Medications Ordered in ED Medications  lidocaine (LIDODERM) 5 % 1 patch (1 patch Transdermal Patch Applied 03/13/24 1040)  acetaminophen (TYLENOL) tablet 650 mg (650 mg Oral Given 03/13/24 1038)  dexamethasone (DECADRON) tablet 4 mg (4 mg Oral Given 03/13/24 1039)  ketorolac (TORADOL) 15 MG/ML injection 15 mg (15 mg Intramuscular Given 03/13/24 1042)  diazepam (VALIUM) tablet 2 mg (2 mg Oral Given 03/13/24 1038)  oxyCODONE-acetaminophen (PERCOCET/ROXICET) 5-325 MG per tablet 1 tablet (1 tablet Oral Given 03/13/24 1038)    ED Course/ Medical Decision Making/ A&P Clinical Course as of 03/13/24 1412  Sat Mar 13, 2024  1004 MR lumbar spine 02/04/24: "IMPRESSION: MRI scan lumbar spine without contrast showing only minor disc and facet degenerative changes most prominent at  L4-5 where there is moderate bilateral foraminal narrowing but no definite compression." [TY]    Clinical Course User Index [TY] Coral Spikes, DO                                 Medical Decision Making 53 year old male presenting emergency department for acute on chronic back pain.  Slightly elevated heart rate, otherwise vital signs reassuring.  Physical exam without focal findings.  Per chart  review had MRI done last month of lumbar spine without cord compression.  Low suspicion for acute cord compression today.  Does not appear to be in significant distress.  Ambulating in the department freely. He is taking baclofen at home.  He notes that he has had relief with Valium in the past.  Will treat with multimodal pain medications.  Stable for discharge.  Risk OTC drugs. Prescription drug management.          Final Clinical Impression(s) / ED Diagnoses Final diagnoses:  Chronic bilateral low back pain without sciatica    Rx / DC Orders ED Discharge Orders          Ordered    diazepam (VALIUM) 5 MG tablet  Every 12 hours PRN        03/13/24 1033    lidocaine 4 %  Every 24 hours        03/13/24 1033              Coral Spikes, DO 03/13/24 1412

## 2024-03-13 NOTE — ED Triage Notes (Signed)
 C/o 2 spinal cord injuries a few years ago, states gets spasms intermittently in back that radiates to legs. Has neurology appt next week. LWBS yesterday d/t wait.

## 2024-03-14 LAB — CULTURE, FUNGUS WITHOUT SMEAR
MICRO NUMBER:: 16153114
SPECIMEN QUALITY:: ADEQUATE

## 2024-03-16 ENCOUNTER — Telehealth: Payer: Self-pay

## 2024-03-16 ENCOUNTER — Encounter: Payer: Self-pay | Admitting: Podiatry

## 2024-03-16 NOTE — Telephone Encounter (Signed)
 Patient called to determine diabetic supplier preference.  RN made patient aware of the decision from the PA team. Per patient he has never used a supplier in the past. RN made patient aware she would be sending Freestyle Libre 3 plus request through [employer] with [employer] as diabetic supplier. Patient upset that he is unable to get through his pharmacy but thanked RN for placing order.

## 2024-03-17 ENCOUNTER — Encounter: Payer: Self-pay | Admitting: Neurology

## 2024-03-17 ENCOUNTER — Ambulatory Visit (INDEPENDENT_AMBULATORY_CARE_PROVIDER_SITE_OTHER): Admitting: Neurology

## 2024-03-17 ENCOUNTER — Ambulatory Visit: Admitting: Family

## 2024-03-17 VITALS — BP 122/74 | HR 98 | Ht 76.0 in | Wt 159.8 lb

## 2024-03-17 DIAGNOSIS — M62838 Other muscle spasm: Secondary | ICD-10-CM

## 2024-03-17 DIAGNOSIS — G4733 Obstructive sleep apnea (adult) (pediatric): Secondary | ICD-10-CM | POA: Diagnosis not present

## 2024-03-17 DIAGNOSIS — R269 Unspecified abnormalities of gait and mobility: Secondary | ICD-10-CM | POA: Diagnosis not present

## 2024-03-17 DIAGNOSIS — R252 Cramp and spasm: Secondary | ICD-10-CM

## 2024-03-17 MED ORDER — LAMOTRIGINE 25 MG PO TABS: ORAL_TABLET | ORAL | 11 refills | Status: AC
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
## 2024-03-24 ENCOUNTER — Ambulatory Visit: Admitting: Family

## 2024-03-25 ENCOUNTER — Telehealth: Payer: Self-pay | Admitting: Neurology

## 2024-03-25 NOTE — Telephone Encounter (Signed)
 no auth required sent to GI [phone]

## 2024-03-26 ENCOUNTER — Encounter: Payer: Self-pay | Admitting: Neurology

## 2024-03-28 ENCOUNTER — Encounter: Payer: Self-pay | Admitting: Endocrinology

## 2024-03-29 ENCOUNTER — Other Ambulatory Visit: Payer: Self-pay

## 2024-03-31 ENCOUNTER — Emergency Department (HOSPITAL_BASED_OUTPATIENT_CLINIC_OR_DEPARTMENT_OTHER)
Admission: EM | Admit: 2024-03-31 | Discharge: 2024-03-31 | Disposition: A | Discharge status: Home / Self Care | Attending: Emergency Medicine | Admitting: Emergency Medicine

## 2024-03-31 ENCOUNTER — Other Ambulatory Visit: Payer: Self-pay

## 2024-03-31 ENCOUNTER — Encounter (HOSPITAL_BASED_OUTPATIENT_CLINIC_OR_DEPARTMENT_OTHER): Payer: Self-pay | Admitting: Emergency Medicine

## 2024-03-31 DIAGNOSIS — Z7982 Long term (current) use of aspirin: Secondary | ICD-10-CM | POA: Insufficient documentation

## 2024-03-31 DIAGNOSIS — Z794 Long term (current) use of insulin: Secondary | ICD-10-CM | POA: Diagnosis not present

## 2024-03-31 DIAGNOSIS — G8929 Other chronic pain: Secondary | ICD-10-CM | POA: Diagnosis not present

## 2024-03-31 DIAGNOSIS — M546 Pain in thoracic spine: Secondary | ICD-10-CM | POA: Insufficient documentation

## 2024-03-31 DIAGNOSIS — M549 Dorsalgia, unspecified: Secondary | ICD-10-CM | POA: Diagnosis present

## 2024-03-31 MED ORDER — OXYCODONE HCL 5 MG PO TABS
10.0000 mg | ORAL_TABLET | Freq: Once | ORAL | Status: AC
  Administered 2024-03-31: 10 mg via ORAL
  Filled 2024-03-31: qty 2

## 2024-03-31 MED ORDER — DIAZEPAM 5 MG PO TABS: 5.0000 mg | ORAL_TABLET | Freq: Two times a day (BID) | ORAL | 0 refills | Status: DC

## 2024-03-31 MED ORDER — DIAZEPAM 5 MG PO TABS
5.0000 mg | ORAL_TABLET | Freq: Once | ORAL | Status: AC
  Administered 2024-03-31: 5 mg via ORAL
  Filled 2024-03-31: qty 1
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
## 2024-03-31 NOTE — ED Triage Notes (Signed)
 Recurrent and chronic mid back spasms  shouting to bilateral legs . Reports sometimes pain shoots to arms as well . In process to see specialist

## 2024-03-31 NOTE — Discharge Instructions (Addendum)
 Continue take all of your medications as prescribed.  You can take 5 mg of Valium as needed over the next couple of days to help out with worsening spasms do not drive if you are taking any Valium.

## 2024-04-03 ENCOUNTER — Encounter (HOSPITAL_BASED_OUTPATIENT_CLINIC_OR_DEPARTMENT_OTHER): Payer: Self-pay

## 2024-04-03 ENCOUNTER — Emergency Department (HOSPITAL_BASED_OUTPATIENT_CLINIC_OR_DEPARTMENT_OTHER)
Admission: EM | Admit: 2024-04-03 | Discharge: 2024-04-03 | Disposition: A | Discharge status: Home / Self Care | Attending: Emergency Medicine | Admitting: Emergency Medicine

## 2024-04-03 ENCOUNTER — Other Ambulatory Visit: Payer: Self-pay

## 2024-04-03 DIAGNOSIS — M5441 Lumbago with sciatica, right side: Secondary | ICD-10-CM | POA: Insufficient documentation

## 2024-04-03 DIAGNOSIS — G8929 Other chronic pain: Secondary | ICD-10-CM | POA: Insufficient documentation

## 2024-04-03 DIAGNOSIS — M5442 Lumbago with sciatica, left side: Secondary | ICD-10-CM | POA: Diagnosis not present

## 2024-04-03 DIAGNOSIS — Z7982 Long term (current) use of aspirin: Secondary | ICD-10-CM | POA: Insufficient documentation

## 2024-04-03 DIAGNOSIS — Z794 Long term (current) use of insulin: Secondary | ICD-10-CM | POA: Insufficient documentation

## 2024-04-03 DIAGNOSIS — M549 Dorsalgia, unspecified: Secondary | ICD-10-CM | POA: Diagnosis present

## 2024-04-03 MED ORDER — DIAZEPAM 5 MG/ML IJ SOLN: 5.0000 mg | Freq: Once | INTRAMUSCULAR | Status: DC

## 2024-04-03 MED ORDER — KETOROLAC TROMETHAMINE 15 MG/ML IJ SOLN: 15.0000 mg | Freq: Once | INTRAMUSCULAR | Status: DC

## 2024-04-03 MED ORDER — ACETAMINOPHEN 500 MG PO TABS
1000.0000 mg | ORAL_TABLET | Freq: Once | ORAL | Status: AC
  Administered 2024-04-03: 1000 mg via ORAL
  Filled 2024-04-03: qty 2

## 2024-04-03 MED ORDER — DIAZEPAM 5 MG/ML IJ SOLN
5.0000 mg | Freq: Once | INTRAMUSCULAR | Status: AC
  Administered 2024-04-03: 5 mg via INTRAMUSCULAR
  Filled 2024-04-03: qty 2

## 2024-04-03 MED ORDER — KETOROLAC TROMETHAMINE 15 MG/ML IJ SOLN
15.0000 mg | Freq: Once | INTRAMUSCULAR | Status: AC
  Administered 2024-04-03: 15 mg via INTRAMUSCULAR
  Filled 2024-04-03: qty 1

## 2024-04-03 MED ORDER — OXYCODONE HCL 5 MG PO TABS
5.0000 mg | ORAL_TABLET | Freq: Once | ORAL | Status: AC
  Administered 2024-04-03: 5 mg via ORAL
  Filled 2024-04-03: qty 1

## 2024-04-03 NOTE — Discharge Instructions (Signed)

## 2024-04-03 NOTE — ED Provider Notes (Signed)
 [place] [facility] Provider Note   CSN: [id number] Arrival date & time: 04/03/24  0703     History  Chief Complaint  Patient presents with   Back Pain    Carlos [name]. is a 53 y.o. male.  53 yo M with a cc of back pain and spasms. This has been going on for years but over the past couple months has got a bit more profound.  He has been seen by a neurologist and had a nerve conduction study.  There is plan to get an MRI next week.  He has been having some worsening spasms and has come to the emergency department for treatment.  He tells me that IV Valium  seems to be most effective and is wondering if I could give him a dose of that.  He does have ongoing issues with urinary retention.  Does not like they have gotten any worse.  Back pain is worse with certain positions usually from sitting to standing.   Back Pain      Home Medications Prior to Admission medications   Medication Sig Start Date End Date Taking? Authorizing Provider  acetaminophen  (TYLENOL ) 325 MG tablet Take 1-2 tablets (325-650 mg total) by mouth every 4 (four) hours as needed for mild pain. 10/03/21   Angiulli, Everlyn Hockey, PA-C  aspirin  EC 81 MG EC tablet Take 1 tablet (81 mg total) by mouth daily. Swallow whole. 10/03/21   Angiulli, Everlyn Hockey, PA-C  atorvastatin  (LIPITOR) 10 MG tablet Take 1 tablet (10 mg total) by mouth daily. 06/13/23   Abraham Abo, MD  B Complex Vitamins (VITAMIN B COMPLEX ) TABS Take1 tablet by mouth daily. 10/03/21   Angiulli, Everlyn Hockey, PA-C  baclofen  (LIORESAL ) 20 MG tablet Take 2 tablets (40 mg total) by mouth 3 (three) times daily. For spasticity 03/11/23   Abraham Abo, MD  Continuous Glucose Sensor (FREESTYLE LIBRE 3 PLUS SENSOR) MISC Inject 1 Device into the skin continuous. Change every 15 days 10/24/23   Thapa, [place], MD  cyclobenzaprine  (FLEXERIL ) 10 MG tablet TAKE 1 TABLET BY MOUTH TWICE A DAY AS NEEDED FOR MUSCLE SPASMS 02/16/24   Abraham Abo, MD  diazepam  (VALIUM ) 5 MG tablet Take 1 tablet (5 mg total) by mouth 2 (two) times daily. 03/31/24   Nathanael Baker, DO  docusate sodium  (COLACE) 100 MG capsule Take 100 mg by mouth 2 (two) times daily.    [provider]  doxepin  (SINEQUAN ) 150 MG capsule TAKE 1 CAPSULE BY MOUTH AT BEDTIME. 12/25/23   Abraham Abo, MD  doxepin  (SINEQUAN ) 50 MG capsule Take 1 capsule (50 mg total) by mouth at bedtime as needed. 03/04/24   Abraham Abo, MD  DULoxetine  (CYMBALTA ) 60 MG capsule TAKE 1 CAPSULE BY MOUTH EVERY DAY 01/26/24   Phebe Brasil, MD  Glucagon  (GVOKE HYPOPEN  1-PACK) 1 MG/0.2ML SOAJ Inject 1 mg into the skin as needed (low blood sugar with impaired consciousness). 11/18/23   Thapa, [place], MD  insulin  glargine (LANTUS  SOLOSTAR) 100 UNIT/ML Solostar Pen Inject 15 Units into the skin daily. 08/21/23   Thapa, [place], MD  insulin  lispro (HUMALOG  KWIKPEN) 100 UNIT/ML KwikPen Take 1-3 units  BEFORE each meal 3 times a day. 08/21/23   Thapa, [place], MD  Insulin  Pen Needle 31G X 5 MM MISC With pen 07/09/23   Lajean Pike, MD  lamoTRIgine  (LAMICTAL ) 25 MG tablet One twice a day xone week, then 2 tabs twice a day 03/17/24   Gracie Lav,  Yijun, MD  lidocaine  4 % Place 1 patch onto the skin daily. 03/13/24   Rolinda Climes, DO  metFORMIN  (GLUCOPHAGE ) 1000 MG tablet TAKE 1 TABLET (1,000 MG TOTAL) BY MOUTH TWICE A DAY WITH FOOD 12/15/23   Thapa, [place], MD  mirtazapine  (REMERON ) 15 MG tablet TAKE 1 TABLET BY MOUTH DAILY AT  BEDTIME 11/17/23   Abraham Abo, MD  Multiple Vitamin (MULTIVITAMIN WITH MINERALS) TABS tablet Take 1 tablet by mouth daily. 10/03/21   Angiulli, Everlyn Hockey, PA-C  naloxone [facility]) nasal spray 4 mg/0.1 mL     [provider]  ondansetron  (ZOFRAN -ODT) 4 MG disintegrating tablet Take 1 tablet (4 mg total) by mouth every 8 (eight) hours as needed. 09/23/23   Vernestine Gondola, PA-C  sildenafil  (VIAGRA ) 100 MG tablet Take 0.5-1 tablets (50-100 mg total) by mouth daily as needed for erectile  dysfunction. 11/10/23   Abraham Abo, MD  tamsulosin  (FLOMAX ) 0.4 MG CAPS capsule Take 2 capsules (0.8 mg total) by mouth daily. 03/09/24   Vaillancourt, Samantha, PA-C      Allergies    Patient has no known allergies.    Review of Systems   Review of Systems  Musculoskeletal:  Positive for back pain.    Physical Exam Updated Vital Signs BP (!) 165/106 (BP Location: Right Arm)   Pulse (!) 102   Temp (!) 97.4 F (36.3 C) (Oral)   Resp 18   SpO2 100%  Physical Exam Vitals and nursing note reviewed.  Constitutional:      Appearance: He is well-developed.  HENT:     Head: Normocephalic and atraumatic.  Eyes:     Pupils: Pupils are equal, round, and reactive to light.  Neck:     Vascular: No JVD.  Cardiovascular:     Rate and Rhythm: Normal rate and regular rhythm.     Heart sounds: No murmur heard.    No friction rub. No gallop.  Pulmonary:     Effort: No respiratory distress.     Breath sounds: No wheezing.  Abdominal:     General: There is no distension.     Tenderness: There is no abdominal tenderness. There is no guarding or rebound.  Musculoskeletal:        General: Normal range of motion.     Cervical back: Normal range of motion and neck supple.     Comments: Pulse motor and sensation intact to the bilateral lower extremities.  He is hyperreflexic bilaterally slightly worse on the right than the left.  No appreciable clonus.  Skin:    Coloration: Skin is not pale.     Findings: No rash.  Neurological:     Mental Status: He is alert and oriented to person, place, and time.  Psychiatric:        Behavior: Behavior normal.     ED Results / Procedures / Treatments   Labs (all labs ordered are listed, but only abnormal results are displayed) Labs Reviewed - No data to display  EKG None  Radiology No results found.  Procedures Procedures    Medications Ordered in ED Medications  diazepam  (VALIUM ) injection 5 mg (has no administration in time range)   ketorolac  (TORADOL ) 15 MG/ML injection 15 mg (has no administration in time range)  acetaminophen  (TYLENOL ) tablet 1,000 mg (1,000 mg Oral Given 04/03/24 0732)  oxyCODONE  (Oxy IR/ROXICODONE ) immediate release tablet 5 mg (5 mg Oral Given 04/03/24 0732)    ED Course/ Medical Decision Making/ A&P  Medical Decision Making Risk OTC drugs. Prescription drug management.   53 yo M with a chief complaints of low back pain and spasms.  Patient is seeing a neurologist for this.  Has a planned MRI next week.  Has problems controlling his pain off and on at home.  He told me he did not take any of his pain medicine today.  He is asking for IV Valium .  Will give an IM injection.  Have him follow-up with his neurologist in the office.  7:36 AM:  I have discussed the diagnosis/risks/treatment options with the patient.  Evaluation and diagnostic testing in the emergency department does not suggest an emergent condition requiring admission or immediate intervention beyond what has been performed at this time.  They will follow up with PCP, neuro, neurosurgery. We also discussed returning to the ED immediately if new or worsening sx occur. We discussed the sx which are most concerning (e.g., sudden worsening pain, fever, inability to tolerate by mouth, cauda equina s/sx) that necessitate immediate return. Medications administered to the patient during their visit and any new prescriptions provided to the patient are listed below.  Medications given during this visit Medications  diazepam  (VALIUM ) injection 5 mg (has no administration in time range)  ketorolac  (TORADOL ) 15 MG/ML injection 15 mg (has no administration in time range)  acetaminophen  (TYLENOL ) tablet 1,000 mg (1,000 mg Oral Given 04/03/24 0732)  oxyCODONE  (Oxy IR/ROXICODONE ) immediate release tablet 5 mg (5 mg Oral Given 04/03/24 0732)     The patient appears reasonably screen and/or stabilized for discharge and I  doubt any other medical condition or other [facility] requiring further screening, evaluation, or treatment in the ED at this time prior to discharge.          Final Clinical Impression(s) / ED Diagnoses Final diagnoses:  Chronic bilateral low back pain with bilateral sciatica    Rx / DC Orders ED Discharge Orders     None         Albertus Hughs, DO 04/03/24 [phone]

## 2024-04-03 NOTE — ED Notes (Signed)
 Pt questioned dosage of Oxy IR. Advised 5 mg. Requesting additional 5 mg due to initial dose will not work. Pt had requested Valium  to be administered IV, but EDP ordered IM

## 2024-04-03 NOTE — ED Triage Notes (Signed)
 Pt reports that this morning he has been having some spasms in his back and both legs. States that this is nothing new. He is trying to get in to see an Neurologist.

## 2024-04-03 NOTE — ED Notes (Signed)
 Pt ambulatory at discharge. Security advised that pt was waiting for wife to transport home since he had received narcotics

## 2024-04-04 ENCOUNTER — Encounter (HOSPITAL_BASED_OUTPATIENT_CLINIC_OR_DEPARTMENT_OTHER): Payer: Self-pay | Admitting: Orthopaedic Surgery

## 2024-04-09 ENCOUNTER — Other Ambulatory Visit

## 2024-04-09 ENCOUNTER — Encounter: Admitting: Family

## 2024-04-09 NOTE — Progress Notes (Signed)
 Erroneous encounter-disregard

## 2024-04-11 ENCOUNTER — Emergency Department (HOSPITAL_BASED_OUTPATIENT_CLINIC_OR_DEPARTMENT_OTHER)
Admission: EM | Admit: 2024-04-11 | Discharge: 2024-04-11 | Disposition: A | Discharge status: Home / Self Care | Attending: Emergency Medicine | Admitting: Emergency Medicine

## 2024-04-11 ENCOUNTER — Encounter (HOSPITAL_BASED_OUTPATIENT_CLINIC_OR_DEPARTMENT_OTHER): Payer: Self-pay | Admitting: Emergency Medicine

## 2024-04-11 ENCOUNTER — Other Ambulatory Visit: Payer: Self-pay

## 2024-04-11 DIAGNOSIS — Z794 Long term (current) use of insulin: Secondary | ICD-10-CM | POA: Insufficient documentation

## 2024-04-11 DIAGNOSIS — M62830 Muscle spasm of back: Secondary | ICD-10-CM | POA: Diagnosis present

## 2024-04-11 DIAGNOSIS — Z7982 Long term (current) use of aspirin: Secondary | ICD-10-CM | POA: Insufficient documentation

## 2024-04-11 DIAGNOSIS — G8929 Other chronic pain: Secondary | ICD-10-CM | POA: Insufficient documentation

## 2024-04-11 MED ORDER — DIAZEPAM 5 MG PO TABS
5.0000 mg | ORAL_TABLET | Freq: Two times a day (BID) | ORAL | 0 refills | Status: DC | PRN
Start: 2024-04-11 — End: 2024-09-14

## 2024-04-11 MED ORDER — DIAZEPAM 5 MG PO TABS
5.0000 mg | ORAL_TABLET | Freq: Once | ORAL | Status: DC
  Filled 2024-04-11: qty 1

## 2024-04-11 NOTE — Discharge Instructions (Addendum)
 1.  Have been given a dose of Valium  in the emergency department to help with muscle spasms.  You have been given a prescription for 2 Valium , 1 to use tonight and 1 tomorrow morning if needed.  You need to call your pain management specialist in the morning to let them know that you needed to visit the emergency department for additional control of symptoms.

## 2024-04-11 NOTE — ED Triage Notes (Signed)
 Pt reports back spasms, hx of frequent visits for same

## 2024-04-11 NOTE — ED Provider Notes (Signed)
 [place] [facility] Provider Note   CSN: [id number] Arrival date & time: 04/11/24  1529     History  Chief Complaint  Patient presents with   Spasms    Carlos Deck Darvon Ueland. is a 53 y.o. male.  HPI Patient presents reporting spasms in his back and down his legs.  He reports this is a problem has had in the past.  He states that he is getting scheduled for a thoracic MRI to further explore his recurrence of symptoms.  Patient is currently under pain management.  He endorses having a chronic buprenorphine pain patch.  He denies having any other prescribed narcotic pain medication.  He does endorse being prescribed Flexeril  but reports it does not help when he is having these kind of spasm so he has not taken it today.  He reports he takes his baclofen  daily as prescribed.  He reports sometimes when it flares up though he needs to come to the emergency department for treatment.  Patient's most recent visit with pain management is as documented below. 04/08/2024: Patient returns today for routine monthly f/u related to chronic pain complaints. Chief complaint today of lower back, neck and bilateral shoulder pain. Overall, he feels that he has been doing well with current treatment regimen including HEP, buprenorphine, and oxycodone /APAP as directed. Pain level today is noted as a 4/10, most aching thorbbing and occassional burning. Worsens with prolonged walking standing repetitive bending and lifting. Denies any new issues or concerns to be discussed today. - - - - - - - - - -.    PRESENTATION TODAY:        PAIN ASSESSMENT           (PEG) PAIN AVERAGE over last week 7          (PEG) RATE PAIN INTERFERED with patient enjoyment of life in past week: 5          (PEG) RATE PAIN INTERFERED with patient's general activity: 5          Total PEG Score 17-19          --Average PEG Score 6          Patient describes their PAIN QUALITY as stinging/burning,  dull/aching, shooting/stabbing          Patient describes the WORST AREA OF PAIN as  Low Back          Does their pain RADIATE? No          Does patient report pregnancy? N/A          Follow Up plan documented          -- Screenings Completed 04/08/2024     Home Medications Prior to Admission medications   Medication Sig Start Date End Date Taking? Authorizing Provider  diazepam  (VALIUM ) 5 MG tablet Take 1 tablet (5 mg total) by mouth every 12 (twelve) hours as needed for anxiety (spasms). 04/11/24  Yes Wynetta Heckle, MD  acetaminophen  (TYLENOL ) 325 MG tablet Take 1-2 tablets (325-650 mg total) by mouth every 4 (four) hours as needed for mild pain. 10/03/21   Angiulli, Everlyn Hockey, PA-C  aspirin  EC 81 MG EC tablet Take 1 tablet (81 mg total) by mouth daily. Swallow whole. 10/03/21   Angiulli, Everlyn Hockey, PA-C  atorvastatin  (LIPITOR) 10 MG tablet Take 1 tablet (10 mg total) by mouth daily. 06/13/23   Abraham Abo, MD  B Complex Vitamins (VITAMIN B COMPLEX ) TABS Take1 tablet  by mouth daily. 10/03/21   Angiulli, Everlyn Hockey, PA-C  baclofen  (LIORESAL ) 20 MG tablet Take 2 tablets (40 mg total) by mouth 3 (three) times daily. For spasticity 03/11/23   Abraham Abo, MD  Continuous Glucose Sensor (FREESTYLE LIBRE 3 PLUS SENSOR) MISC Inject 1 Device into the skin continuous. Change every 15 days 10/24/23   Thapa, [place], MD  cyclobenzaprine  (FLEXERIL ) 10 MG tablet TAKE 1 TABLET BY MOUTH TWICE A DAY AS NEEDED FOR MUSCLE SPASMS 02/16/24   Abraham Abo, MD  diazepam  (VALIUM ) 5 MG tablet Take 1 tablet (5 mg total) by mouth 2 (two) times daily. 03/31/24   Nathanael Baker, DO  docusate sodium  (COLACE) 100 MG capsule Take 100 mg by mouth 2 (two) times daily.    [provider]  doxepin  (SINEQUAN ) 150 MG capsule TAKE 1 CAPSULE BY MOUTH AT BEDTIME. 12/25/23   Abraham Abo, MD  doxepin  (SINEQUAN ) 50 MG capsule Take 1 capsule (50 mg total) by mouth at bedtime as needed. 03/04/24   Abraham Abo, MD  DULoxetine   (CYMBALTA ) 60 MG capsule TAKE 1 CAPSULE BY MOUTH EVERY DAY 01/26/24   Phebe Brasil, MD  Glucagon  (GVOKE HYPOPEN  1-PACK) 1 MG/0.2ML SOAJ Inject 1 mg into the skin as needed (low blood sugar with impaired consciousness). 11/18/23   Thapa, [place], MD  insulin  glargine (LANTUS  SOLOSTAR) 100 UNIT/ML Solostar Pen Inject 15 Units into the skin daily. 08/21/23   Thapa, [place], MD  insulin  lispro (HUMALOG  KWIKPEN) 100 UNIT/ML KwikPen Take 1-3 units  BEFORE each meal 3 times a day. 08/21/23   Thapa, [place], MD  Insulin  Pen Needle 31G X 5 MM MISC With pen 07/09/23   Lajean Pike, MD  lamoTRIgine  (LAMICTAL ) 25 MG tablet One twice a day xone week, then 2 tabs twice a day 03/17/24   Phebe Brasil, MD  lidocaine  4 % Place 1 patch onto the skin daily. 03/13/24   Rolinda Climes, DO  metFORMIN  (GLUCOPHAGE ) 1000 MG tablet TAKE 1 TABLET (1,000 MG TOTAL) BY MOUTH TWICE A DAY WITH FOOD 12/15/23   Thapa, [place], MD  mirtazapine  (REMERON ) 15 MG tablet TAKE 1 TABLET BY MOUTH DAILY AT  BEDTIME 11/17/23   Abraham Abo, MD  Multiple Vitamin (MULTIVITAMIN WITH MINERALS) TABS tablet Take 1 tablet by mouth daily. 10/03/21   Angiulli, Everlyn Hockey, PA-C  naloxone [facility]) nasal spray 4 mg/0.1 mL     [provider]  ondansetron  (ZOFRAN -ODT) 4 MG disintegrating tablet Take 1 tablet (4 mg total) by mouth every 8 (eight) hours as needed. 09/23/23   Vernestine Gondola, PA-C  sildenafil  (VIAGRA ) 100 MG tablet Take 0.5-1 tablets (50-100 mg total) by mouth daily as needed for erectile dysfunction. 11/10/23   Abraham Abo, MD  tamsulosin  (FLOMAX ) 0.4 MG CAPS capsule Take 2 capsules (0.8 mg total) by mouth daily. 03/09/24   Vaillancourt, Samantha, PA-C      Allergies    Patient has no known allergies.    Review of Systems   Review of Systems  Physical Exam Updated Vital Signs BP (!) 160/87 (BP Location: Right Arm)   Pulse 87   Temp 98.2 F (36.8 C) (Oral)   Resp 18   Ht 6\' 4"  (1.93 m)   Wt 72.6 kg   SpO2 100%   BMI 19.48 kg/m  Physical  Exam Constitutional:      Comments: Alert nontoxic well in appearance.  Patient reporting significant spasm and pain in his back.  Eyes:     Extraocular Movements: Extraocular  movements intact.  Cardiovascular:     Rate and Rhythm: Normal rate and regular rhythm.  Pulmonary:     Effort: Pulmonary effort is normal.     Breath sounds: Normal breath sounds.  Abdominal:     General: There is no distension.     Palpations: Abdomen is soft.     Tenderness: There is no abdominal tenderness.  Musculoskeletal:        General: Normal range of motion.     Comments: Patient is able to shift from side-to-side and sit up and forward in the stretcher.  No visible swelling or soft tissue abnormality of the back.  Pain is endorsed diffusely.  No peripheral edema.  Lower extremities have soft pliable calves.  Skin:    General: Skin is warm and dry.  Neurological:     Comments: Patient is alert.  Normal mental status.  Speech clear.  No focal motor deficits.  Movements are coordinated purposeful symmetric.     ED Results / Procedures / Treatments   Labs (all labs ordered are listed, but only abnormal results are displayed) Labs Reviewed - No data to display  EKG None  Radiology No results found.  Procedures Procedures    Medications Ordered in ED Medications  diazepam  (VALIUM ) tablet 5 mg (has no administration in time range)    ED Course/ Medical Decision Making/ A&P                                 Medical Decision Making  Patient presents as outlined.  He reports having significant spasms that are symmetric down most of his back from the mid thoracic back into his buttocks and down his legs.  Patient is clinically well in appearance.  Nontoxic.  EMR reviewed.  Patient does have chronic pain management with buprenorphine patch.  Patient denied having additional pain medications to take but notation indicates and review of prescribing record that patient does have Percocet to take for  breakthrough pain.  He is also prescribed chronically baclofen  and Flexeril  and occasionally Valium  for muscle spasms.  Patient reports having taken his baclofen  already but not taken the Flexeril .  At this time for an acute exacerbation of muscle spasms, will give an oral dose of Valium  per his usual exacerbation dosing.  I have prescribed 2 Valium  tablets to take for exacerbation of spasms and recommend contacting his pain management provider tomorrow morning to notify of current exacerbation.        Final Clinical Impression(s) / ED Diagnoses Final diagnoses:  Back muscle spasm  Other chronic pain    Rx / DC Orders ED Discharge Orders          Ordered    diazepam  (VALIUM ) 5 MG tablet  Every 12 hours PRN        04/11/24 1654              Wynetta Heckle, MD 04/11/24 1704

## 2024-04-16 ENCOUNTER — Ambulatory Visit (HOSPITAL_BASED_OUTPATIENT_CLINIC_OR_DEPARTMENT_OTHER): Admitting: Orthopaedic Surgery

## 2024-04-21 ENCOUNTER — Encounter (HOSPITAL_COMMUNITY): Payer: Self-pay

## 2024-04-28 ENCOUNTER — Ambulatory Visit: Admitting: Family

## 2024-05-05 ENCOUNTER — Other Ambulatory Visit: Payer: Self-pay | Admitting: Family Medicine

## 2024-05-05 ENCOUNTER — Encounter: Payer: Self-pay | Admitting: Family Medicine

## 2024-05-05 DIAGNOSIS — Z1211 Encounter for screening for malignant neoplasm of colon: Secondary | ICD-10-CM

## 2024-05-14 ENCOUNTER — Ambulatory Visit
Admission: RE | Admit: 2024-05-14 | Discharge: 2024-05-14 | Disposition: A | Discharge status: Home / Self Care | Source: Ambulatory Visit | Attending: Neurology | Admitting: Neurology

## 2024-05-14 DIAGNOSIS — G4733 Obstructive sleep apnea (adult) (pediatric): Secondary | ICD-10-CM

## 2024-05-14 DIAGNOSIS — R252 Cramp and spasm: Secondary | ICD-10-CM

## 2024-05-14 DIAGNOSIS — M62838 Other muscle spasm: Secondary | ICD-10-CM

## 2024-05-14 DIAGNOSIS — R269 Unspecified abnormalities of gait and mobility: Secondary | ICD-10-CM

## 2024-05-17 ENCOUNTER — Encounter: Payer: Self-pay | Admitting: Neurology

## 2024-05-17 MED ORDER — ALPRAZOLAM 1 MG PO TABS: ORAL_TABLET | ORAL | 0 refills | Status: DC

## 2024-05-17 MED ORDER — GABAPENTIN 300 MG PO CAPS
300.0000 mg | ORAL_CAPSULE | Freq: Three times a day (TID) | ORAL | 11 refills | Status: DC
Start: 2024-05-17 — End: 2024-11-09

## 2024-05-17 NOTE — Telephone Encounter (Signed)
 Meds ordered this encounter  Medications   ALPRAZolam  (XANAX ) 1 MG tablet    Sig: Take 1-2 tablets 30 minutes prior to MRI, may repeat once as needed. Must have driver.    Dispense:  3 tablet    Refill:  0   gabapentin  (NEURONTIN ) 300 MG capsule    Sig: Take 1 capsule (300 mg total) by mouth 3 (three) times daily.    Dispense:  90 capsule    Refill:  11

## 2024-05-21 ENCOUNTER — Ambulatory Visit: Admitting: Endocrinology

## 2024-05-24 ENCOUNTER — Telehealth: Payer: Self-pay

## 2024-05-24 NOTE — Telephone Encounter (Signed)
 Patient called to reschedule appointment

## 2024-06-01 ENCOUNTER — Encounter: Payer: Self-pay | Admitting: Endocrinology

## 2024-06-01 ENCOUNTER — Encounter: Payer: Self-pay | Admitting: Pediatrics

## 2024-06-04 NOTE — Telephone Encounter (Signed)
 LMx2 to schedule appointment for today, 06/04/2024 at 11:00 AM.

## 2024-06-10 ENCOUNTER — Encounter

## 2024-06-10 DIAGNOSIS — M5416 Radiculopathy, lumbar region: Secondary | ICD-10-CM | POA: Diagnosis not present

## 2024-06-10 DIAGNOSIS — M961 Postlaminectomy syndrome, not elsewhere classified: Secondary | ICD-10-CM | POA: Diagnosis not present

## 2024-06-10 DIAGNOSIS — G8929 Other chronic pain: Secondary | ICD-10-CM | POA: Diagnosis not present

## 2024-06-10 DIAGNOSIS — S14109S Unspecified injury at unspecified level of cervical spinal cord, sequela: Secondary | ICD-10-CM | POA: Diagnosis not present

## 2024-06-10 DIAGNOSIS — M545 Low back pain, unspecified: Secondary | ICD-10-CM | POA: Diagnosis not present

## 2024-06-10 DIAGNOSIS — R252 Cramp and spasm: Secondary | ICD-10-CM | POA: Diagnosis not present

## 2024-06-10 DIAGNOSIS — Z79891 Long term (current) use of opiate analgesic: Secondary | ICD-10-CM | POA: Diagnosis not present

## 2024-06-11 ENCOUNTER — Ambulatory Visit (AMBULATORY_SURGERY_CENTER)

## 2024-06-11 VITALS — Ht 76.0 in | Wt 163.0 lb

## 2024-06-11 DIAGNOSIS — Z1211 Encounter for screening for malignant neoplasm of colon: Secondary | ICD-10-CM

## 2024-06-11 MED ORDER — NA SULFATE-K SULFATE-MG SULF 17.5-3.13-1.6 GM/177ML PO SOLN
1.0000 | Freq: Once | ORAL | 0 refills | Status: AC
Start: 2024-06-11 — End: 2024-06-11

## 2024-06-11 NOTE — Progress Notes (Signed)
 No egg or soy allergy known to patient  No issues known to pt with past sedation with any surgeries or procedures Patient denies ever being told they had issues or difficulty with intubation  No FH of Malignant Hyperthermia Pt is not on diet pills Pt is not on  home 02  Pt is not on blood thinners  Occ constipation from pain medication No A fib or A flutter Have any cardiac testing pending-- no  LOA: independent  Prep: suprep  Patient's chart reviewed by Norleen Schillings CNRA prior to previsit and patient appropriate for the [facility].  Previsit completed and red dot placed by patient's name on their procedure day (on provider's schedule).     PV completed with patient. Prep instructions sent via mychart and home address.

## 2024-06-15 ENCOUNTER — Encounter: Payer: Self-pay | Admitting: Endocrinology

## 2024-06-15 ENCOUNTER — Other Ambulatory Visit: Payer: Self-pay

## 2024-06-15 DIAGNOSIS — Z794 Long term (current) use of insulin: Secondary | ICD-10-CM

## 2024-06-15 MED ORDER — INSULIN LISPRO (1 UNIT DIAL) 100 UNIT/ML (KWIKPEN): PEN_INJECTOR | SUBCUTANEOUS | 2 refills | Status: DC

## 2024-06-19 ENCOUNTER — Encounter: Payer: Self-pay | Admitting: Pediatrics

## 2024-06-23 ENCOUNTER — Encounter: Payer: Self-pay | Admitting: Endocrinology

## 2024-06-23 ENCOUNTER — Other Ambulatory Visit: Payer: Self-pay

## 2024-06-23 DIAGNOSIS — E11649 Type 2 diabetes mellitus with hypoglycemia without coma: Secondary | ICD-10-CM

## 2024-06-23 MED ORDER — METFORMIN HCL 1000 MG PO TABS: ORAL_TABLET | ORAL | 1 refills | Status: DC

## 2024-06-24 ENCOUNTER — Encounter: Admitting: Pediatrics

## 2024-06-25 ENCOUNTER — Encounter: Admitting: Family

## 2024-06-29 ENCOUNTER — Encounter (HOSPITAL_BASED_OUTPATIENT_CLINIC_OR_DEPARTMENT_OTHER): Payer: Self-pay

## 2024-06-29 ENCOUNTER — Other Ambulatory Visit: Payer: Self-pay

## 2024-06-29 ENCOUNTER — Emergency Department (HOSPITAL_BASED_OUTPATIENT_CLINIC_OR_DEPARTMENT_OTHER)
Admission: EM | Admit: 2024-06-29 | Discharge: 2024-06-29 | Disposition: A | Discharge status: Home / Self Care | Attending: Emergency Medicine | Admitting: Emergency Medicine

## 2024-06-29 ENCOUNTER — Emergency Department (HOSPITAL_BASED_OUTPATIENT_CLINIC_OR_DEPARTMENT_OTHER)

## 2024-06-29 ENCOUNTER — Encounter: Payer: Self-pay | Admitting: Pediatrics

## 2024-06-29 DIAGNOSIS — K5730 Diverticulosis of large intestine without perforation or abscess without bleeding: Secondary | ICD-10-CM | POA: Diagnosis not present

## 2024-06-29 DIAGNOSIS — Z7982 Long term (current) use of aspirin: Secondary | ICD-10-CM | POA: Insufficient documentation

## 2024-06-29 DIAGNOSIS — R1033 Periumbilical pain: Secondary | ICD-10-CM | POA: Diagnosis not present

## 2024-06-29 DIAGNOSIS — Z7984 Long term (current) use of oral hypoglycemic drugs: Secondary | ICD-10-CM | POA: Insufficient documentation

## 2024-06-29 DIAGNOSIS — E1165 Type 2 diabetes mellitus with hyperglycemia: Secondary | ICD-10-CM | POA: Insufficient documentation

## 2024-06-29 DIAGNOSIS — R109 Unspecified abdominal pain: Secondary | ICD-10-CM

## 2024-06-29 DIAGNOSIS — Z794 Long term (current) use of insulin: Secondary | ICD-10-CM | POA: Insufficient documentation

## 2024-06-29 DIAGNOSIS — K5792 Diverticulitis of intestine, part unspecified, without perforation or abscess without bleeding: Secondary | ICD-10-CM | POA: Diagnosis not present

## 2024-06-29 LAB — CBC WITH DIFFERENTIAL/PLATELET
Abs Immature Granulocytes: 0.05 K/uL (ref 0.00–0.07)
Basophils Absolute: 0 K/uL (ref 0.0–0.1)
Basophils Relative: 1 %
Eosinophils Absolute: 0.5 K/uL (ref 0.0–0.5)
Eosinophils Relative: 8 %
HCT: 38.7 % — ABNORMAL LOW (ref 39.0–52.0)
Hemoglobin: 12.9 g/dL — ABNORMAL LOW (ref 13.0–17.0)
Immature Granulocytes: 1 %
Lymphocytes Relative: 42 %
Lymphs Abs: 2.8 K/uL (ref 0.7–4.0)
MCH: 29.9 pg (ref 26.0–34.0)
MCHC: 33.3 g/dL (ref 30.0–36.0)
MCV: 89.8 fL (ref 80.0–100.0)
Monocytes Absolute: 0.4 K/uL (ref 0.1–1.0)
Monocytes Relative: 6 %
Neutro Abs: 2.8 K/uL (ref 1.7–7.7)
Neutrophils Relative %: 42 %
Platelets: 245 K/uL (ref 150–400)
RBC: 4.31 MIL/uL (ref 4.22–5.81)
RDW: 12.9 % (ref 11.5–15.5)
WBC: 6.6 K/uL (ref 4.0–10.5)
nRBC: 0 % (ref 0.0–0.2)

## 2024-06-29 LAB — COMPREHENSIVE METABOLIC PANEL WITH GFR
ALT: 13 U/L (ref 0–44)
AST: 18 U/L (ref 15–41)
Albumin: 4.1 g/dL (ref 3.5–5.0)
Alkaline Phosphatase: 60 U/L (ref 38–126)
Anion gap: 12 (ref 5–15)
BUN: 14 mg/dL (ref 6–20)
CO2: 25 mmol/L (ref 22–32)
Calcium: 9.1 mg/dL (ref 8.9–10.3)
Chloride: 98 mmol/L (ref 98–111)
Creatinine, Ser: 0.8 mg/dL (ref 0.61–1.24)
GFR, Estimated: >60 mL/min (ref >60)
Glucose, Bld: 304 mg/dL — ABNORMAL HIGH (ref 70–99)
Potassium: 4 mmol/L (ref 3.5–5.1)
Sodium: 135 mmol/L (ref 135–145)
Total Bilirubin: 0.3 mg/dL (ref 0.0–1.2)
Total Protein: 6.9 g/dL (ref 6.5–8.1)

## 2024-06-29 LAB — LIPASE, BLOOD: Lipase: 23 U/L (ref 11–51)

## 2024-06-29 MED ORDER — IOHEXOL 300 MG/ML  SOLN
100.0000 mL | Freq: Once | INTRAMUSCULAR | Status: AC | PRN
  Administered 2024-06-29: 100 mL via INTRAVENOUS

## 2024-06-29 NOTE — ED Notes (Signed)
 Patient is resting comfortably.

## 2024-06-29 NOTE — ED Notes (Signed)
 Patient transported to CT

## 2024-06-29 NOTE — Discharge Instructions (Signed)
 You were seen in the emerged part for abdominal pain and concern for an abdominal defect Your blood work showed high blood sugar but otherwise looked okay The CAT scan of your abdomen did not show any hernia or other concerning findings of the abdomen Continue taking your diabetes medications at home and check your sugars as it was over 300 here today Return to the emerged part for uncontrolled blood sugars, severe abdominal pain or any other concerns

## 2024-06-29 NOTE — ED Provider Notes (Signed)
 [place] [facility]MENT AT [facility] Provider Note   CSN: [id number] Arrival date & time: 06/29/24  9184     Patient presents with: Abdominal Pain   Carlos [name]. is a 53 y.o. male.  With a history of type 2 diabetes, hyperlipidemia and chronic back pain presents to ED given concern for abdominal pain.  Patient first noticed a defect in his abdominal wall 4 to 6 weeks ago which she describes as an indentation on the left side.  This seems to fluctuate with changes in position and has been mostly painless however last night he began to develop pain in this region which he described as a twisting sensation.  His bowel movements have been normal with last bowel movement last night.  Some mild nausea with the pain last night.  No vomiting fevers chills chest pain shortness of breath.  No prior history of abdominal surgeries.  Patient notes that his mother passed away from colon cancer in April and this is likely contributing to his anxiety today.    Abdominal Pain      Prior to Admission medications   Medication Sig Start Date End Date Taking? Authorizing Provider  acetaminophen  (TYLENOL ) 325 MG tablet Take 1-2 tablets (325-650 mg total) by mouth every 4 (four) hours as needed for mild pain. 10/03/21   Angiulli, Toribio PARAS, PA-C  ALPRAZolam  (XANAX ) 1 MG tablet Take 1-2 tablets 30 minutes prior to MRI, may repeat once as needed. Must have driver. Patient not taking: Reported on 06/11/2024 05/17/24   Onita Duos, MD  aspirin  EC 81 MG EC tablet Take 1 tablet (81 mg total) by mouth daily. Swallow whole. 10/03/21   Angiulli, Toribio PARAS, PA-C  atorvastatin  (LIPITOR) 10 MG tablet Take 1 tablet (10 mg total) by mouth daily. 06/13/23   Tanda Bleacher, MD  B Complex Vitamins (VITAMIN B COMPLEX ) TABS Take1 tablet by mouth daily. 10/03/21   Angiulli, Toribio PARAS, PA-C  baclofen  (LIORESAL ) 20 MG tablet Take 2 tablets (40 mg total) by mouth 3 (three) times daily. For spasticity 03/11/23    Tanda Bleacher, MD  buprenorphine (BUTRANS) 20 MCG/HR PTWK Place 1 patch onto the skin once a week. 05/11/24   [provider]  Continuous Glucose Sensor (FREESTYLE LIBRE 3 PLUS SENSOR) MISC Inject 1 Device into the skin continuous. Change every 15 days 10/24/23   Thapa, [place], MD  cyclobenzaprine  (FLEXERIL ) 10 MG tablet TAKE 1 TABLET BY MOUTH TWICE A DAY AS NEEDED FOR MUSCLE SPASMS Patient taking differently: Take 10 mg by mouth as needed. 02/16/24   Tanda Bleacher, MD  diazepam  (VALIUM ) 5 MG tablet Take 1 tablet (5 mg total) by mouth 2 (two) times daily. Patient not taking: Reported on 06/11/2024 03/31/24   Mannie Pac T, DO  diazepam  (VALIUM ) 5 MG tablet Take 1 tablet (5 mg total) by mouth every 12 (twelve) hours as needed for anxiety (spasms). Patient not taking: Reported on 06/11/2024 04/11/24   Armenta Canning, MD  docusate sodium  (COLACE) 100 MG capsule Take 100 mg by mouth 2 (two) times daily.    [provider]  doxepin  (SINEQUAN ) 150 MG capsule TAKE 1 CAPSULE BY MOUTH AT BEDTIME. 12/25/23   Tanda Bleacher, MD  doxepin  (SINEQUAN ) 50 MG capsule Take 1 capsule (50 mg total) by mouth at bedtime as needed. 03/04/24   Tanda Bleacher, MD  DULoxetine  (CYMBALTA ) 60 MG capsule TAKE 1 CAPSULE BY MOUTH EVERY DAY Patient not taking: Reported on 06/11/2024 01/26/24   Onita Duos,  MD  gabapentin  (NEURONTIN ) 300 MG capsule Take 1 capsule (300 mg total) by mouth 3 (three) times daily. 05/17/24   Onita Duos, MD  Glucagon  (GVOKE HYPOPEN  1-PACK) 1 MG/0.2ML SOAJ Inject 1 mg into the skin as needed (low blood sugar with impaired consciousness). 11/18/23   Thapa, [place], MD  insulin  glargine (LANTUS  SOLOSTAR) 100 UNIT/ML Solostar Pen Inject 15 Units into the skin daily. 08/21/23   Thapa, [place], MD  insulin  lispro (HUMALOG  KWIKPEN) 100 UNIT/ML KwikPen Take 1-3 units  BEFORE each meal 3 times a day. 06/15/24   Thapa, [place], MD  Insulin  Pen Needle 31G X 5 MM MISC With pen 07/09/23   [place], MD  lamoTRIgine   (LAMICTAL ) 25 MG tablet One twice a day xone week, then 2 tabs twice a day Patient not taking: Reported on 06/11/2024 03/17/24   Onita Duos, MD  lidocaine  4 % Place 1 patch onto the skin daily. Patient not taking: Reported on 06/11/2024 03/13/24   Neysa Caron PARAS, DO  metFORMIN  (GLUCOPHAGE ) 1000 MG tablet TAKE 1 TABLET (1,000 MG TOTAL) BY MOUTH TWICE A DAY WITH FOOD 06/23/24   Thapa, [place], MD  mirtazapine  (REMERON ) 15 MG tablet TAKE 1 TABLET BY MOUTH DAILY AT  BEDTIME 11/17/23   Tanda Bleacher, MD  Multiple Vitamin (MULTIVITAMIN WITH MINERALS) TABS tablet Take 1 tablet by mouth daily. 10/03/21   Angiulli, Toribio PARAS, PA-C  naloxone [facility]) nasal spray 4 mg/0.1 mL     [provider]  ondansetron  (ZOFRAN -ODT) 4 MG disintegrating tablet Take 1 tablet (4 mg total) by mouth every 8 (eight) hours as needed. 09/23/23   Billy Asberry FALCON, PA-C  oxyCODONE -acetaminophen  (PERCOCET) 10-325 MG tablet Take 1 tablet by mouth every 6 (six) hours as needed. 05/11/24   [provider]  sildenafil  (VIAGRA ) 100 MG tablet Take 0.5-1 tablets (50-100 mg total) by mouth daily as needed for erectile dysfunction. 11/10/23   Tanda Bleacher, MD  tamsulosin  (FLOMAX ) 0.4 MG CAPS capsule Take 2 capsules (0.8 mg total) by mouth daily. 03/09/24   Vaillancourt, Samantha, PA-C    Allergies: Patient has no known allergies.    Review of Systems  Gastrointestinal:  Positive for abdominal pain.    Updated Vital Signs BP 119/73   Pulse 65   Temp 98.4 F (36.9 C)   Resp 17   Ht 6' 4 (1.93 m)   Wt 72.6 kg   SpO2 95%   BMI 19.48 kg/m   Physical Exam Vitals and nursing note reviewed.  HENT:     Head: Normocephalic and atraumatic.  Eyes:     Pupils: Pupils are equal, round, and reactive to light.  Cardiovascular:     Rate and Rhythm: Normal rate and regular rhythm.  Pulmonary:     Effort: Pulmonary effort is normal.     Breath sounds: Normal breath sounds.  Abdominal:     Palpations: Abdomen is soft.      Tenderness: There is abdominal tenderness in the periumbilical area. There is no guarding or rebound.     Comments: Protuberant abdomen with mild tenderness over the periumbilical region No palpable defect or hernia No overlying skin changes  Skin:    General: Skin is warm and dry.  Neurological:     Mental Status: He is alert.  Psychiatric:        Mood and Affect: Mood normal.     (all labs ordered are listed, but only abnormal results are displayed) Labs Reviewed  COMPREHENSIVE METABOLIC PANEL WITH GFR -  Abnormal; Notable for the following components:      Result Value   Glucose, Bld 304 (*)    All other components within normal limits  CBC WITH DIFFERENTIAL/PLATELET - Abnormal; Notable for the following components:   Hemoglobin 12.9 (*)    HCT 38.7 (*)    All other components within normal limits  LIPASE, BLOOD    EKG: None  Radiology: CT ABDOMEN PELVIS W CONTRAST Result Date: 06/29/2024 CLINICAL DATA:  Intermittent abdominal pain and tenderness with twisting pain last night in the abdomen. Indentation across abdomen near umbilicus. ?Ventral hernia EXAM: CT ABDOMEN AND PELVIS WITH CONTRAST TECHNIQUE: Multidetector CT imaging of the abdomen and pelvis was performed using the standard protocol following bolus administration of intravenous contrast. RADIATION DOSE REDUCTION: This exam was performed according to the departmental dose-optimization program which includes automated exposure control, adjustment of the mA and/or kV according to patient size and/or use of iterative reconstruction technique. CONTRAST:  OMNIPAQUE  IOHEXOL  300 MG/ML  SOLN COMPARISON:  August 29, 2021 FINDINGS: Lower chest: No focal airspace consolidation or pleural effusion. Hepatobiliary: No mass.No radiopaque stones or wall thickening of the gallbladder. No intrahepatic or extrahepatic biliary ductal dilation. The portal veins are patent. Pancreas: No mass or main ductal dilation. No peripancreatic  inflammation or fluid collection. Spleen: Normal size. No mass. Adrenals/Urinary Tract: No adrenal masses. No renal mass. No nephrolithiasis or hydronephrosis. The urinary bladder is distended without focal abnormality. Stomach/Bowel: The stomach is decompressed without focal abnormality. No small bowel wall thickening or inflammation. No small bowel obstruction.Normal appendix. Scattered sigmoid colonic diverticulosis. No changes of acute diverticulitis. Vascular/Lymphatic: No aortic aneurysm. No intraabdominal or pelvic lymphadenopathy. Reproductive: No prostatomegaly.No free pelvic fluid. Other: No pneumoperitoneum, ascites, or mesenteric inflammation. Musculoskeletal: No acute fracture or destructive lesion. Small bone island in the left pelvis. IMPRESSION: 1. No acute intra-abdominal or pelvic abnormality. No ventral abdominal hernia. 2. Scattered sigmoid colonic diverticulosis. No changes of acute diverticulitis. Electronically Signed   By: Rogelia Myers M.D.   On: 06/29/2024 10:22     Procedures   Medications Ordered in the ED  iohexol  (OMNIPAQUE ) 300 MG/ML solution 100 mL (100 mLs Intravenous Contrast Given 06/29/24 0932)    Clinical Course as of 06/29/24 1038  Tue Jun 29, 2024  1032 Labs notable for hyperglycemia.  Otherwise CMP CBC within normal limits.  CT abdomen pelvis shows no ventral hernia or other acute intra-abdominal process.  Patient appropriate for discharge.  Informed him of hyperglycemia and need to follow blood sugars closely.  He has diabetes medications and supplies at home [MP]    Clinical Course User Index [MP] Pamella Ozell LABOR, DO                                 Medical Decision Making 53 year old male with history as above presents to the ED given concern for abdominal pain and abdominal wall defect he first noticed 4 to 6 weeks ago.  Some mild nausea with more discomfort last night.  Afebrile normotensive well-appearing on my exam.  Abdominal exam notable for  protuberant abdomen with some asymmetry and tenderness.  Based on clinical history low suspicion for incarceration or strangulation but will obtain laboratory workup along with CT abdomen pelvis with IV contrast to better evaluate.  Other differential diagnosis to consider would be intra-abdominal mass or constipation.  Will also obtain laboratory workup including comprehensive metabolic panel, lipase and CBC.  No  abdominal pain at rest currently will continue to monitor.  Amount and/or Complexity of Data Reviewed Labs: ordered. Radiology: ordered.  Risk Prescription drug management.        Final diagnoses:  Abdominal pain, unspecified abdominal location  Hyperglycemia due to diabetes mellitus [facility])    ED Discharge Orders     None          Pamella Ozell LABOR, DO 06/29/24 1038

## 2024-06-29 NOTE — ED Triage Notes (Signed)
 States the past month has had an indention across abdomen near umbilicus. Intermittent pain/tenderness to the area. Last night felt twisting pain in abdomen. Mother recently passed away from colon cancer.

## 2024-07-01 ENCOUNTER — Encounter: Payer: Self-pay | Admitting: Neurology

## 2024-07-01 ENCOUNTER — Encounter: Payer: Self-pay | Admitting: Family Medicine

## 2024-07-01 ENCOUNTER — Institutional Professional Consult (permissible substitution): Admitting: Neurology

## 2024-07-01 DIAGNOSIS — M2042 Other hammer toe(s) (acquired), left foot: Secondary | ICD-10-CM | POA: Diagnosis not present

## 2024-07-01 DIAGNOSIS — E119 Type 2 diabetes mellitus without complications: Secondary | ICD-10-CM | POA: Diagnosis not present

## 2024-07-01 DIAGNOSIS — M2041 Other hammer toe(s) (acquired), right foot: Secondary | ICD-10-CM | POA: Diagnosis not present

## 2024-07-12 ENCOUNTER — Encounter: Payer: Self-pay | Admitting: Endocrinology

## 2024-07-13 DIAGNOSIS — S14109S Unspecified injury at unspecified level of cervical spinal cord, sequela: Secondary | ICD-10-CM | POA: Diagnosis not present

## 2024-07-13 DIAGNOSIS — R252 Cramp and spasm: Secondary | ICD-10-CM | POA: Diagnosis not present

## 2024-07-13 DIAGNOSIS — M545 Low back pain, unspecified: Secondary | ICD-10-CM | POA: Diagnosis not present

## 2024-07-13 DIAGNOSIS — M961 Postlaminectomy syndrome, not elsewhere classified: Secondary | ICD-10-CM | POA: Diagnosis not present

## 2024-07-13 DIAGNOSIS — M5416 Radiculopathy, lumbar region: Secondary | ICD-10-CM | POA: Diagnosis not present

## 2024-07-13 DIAGNOSIS — G8929 Other chronic pain: Secondary | ICD-10-CM | POA: Diagnosis not present

## 2024-07-13 DIAGNOSIS — Z79891 Long term (current) use of opiate analgesic: Secondary | ICD-10-CM | POA: Diagnosis not present

## 2024-07-15 ENCOUNTER — Encounter (HOSPITAL_BASED_OUTPATIENT_CLINIC_OR_DEPARTMENT_OTHER): Payer: Self-pay | Admitting: Orthopaedic Surgery

## 2024-07-15 ENCOUNTER — Ambulatory Visit (HOSPITAL_BASED_OUTPATIENT_CLINIC_OR_DEPARTMENT_OTHER): Admitting: Orthopaedic Surgery

## 2024-07-15 ENCOUNTER — Encounter (HOSPITAL_BASED_OUTPATIENT_CLINIC_OR_DEPARTMENT_OTHER): Payer: Self-pay

## 2024-07-23 ENCOUNTER — Encounter: Payer: Self-pay | Admitting: Family Medicine

## 2024-07-23 ENCOUNTER — Telehealth: Payer: Self-pay | Admitting: Gastroenterology

## 2024-07-23 NOTE — Telephone Encounter (Signed)
 Good morning Dr. Suzann, This patient is requesting to cancel his colonoscopy procedure due to him having to travel out of town. Patient stated that he will call back to reschedule. Please advise.

## 2024-07-28 ENCOUNTER — Encounter: Admitting: Pediatrics

## 2024-08-09 ENCOUNTER — Ambulatory Visit: Admitting: Endocrinology

## 2024-08-10 ENCOUNTER — Encounter: Payer: Self-pay | Admitting: Endocrinology

## 2024-08-10 DIAGNOSIS — G8929 Other chronic pain: Secondary | ICD-10-CM | POA: Diagnosis not present

## 2024-08-10 DIAGNOSIS — S14109S Unspecified injury at unspecified level of cervical spinal cord, sequela: Secondary | ICD-10-CM | POA: Diagnosis not present

## 2024-08-10 DIAGNOSIS — Z79891 Long term (current) use of opiate analgesic: Secondary | ICD-10-CM | POA: Diagnosis not present

## 2024-08-10 DIAGNOSIS — M5416 Radiculopathy, lumbar region: Secondary | ICD-10-CM | POA: Diagnosis not present

## 2024-08-10 DIAGNOSIS — R252 Cramp and spasm: Secondary | ICD-10-CM | POA: Diagnosis not present

## 2024-08-10 DIAGNOSIS — M961 Postlaminectomy syndrome, not elsewhere classified: Secondary | ICD-10-CM | POA: Diagnosis not present

## 2024-08-10 DIAGNOSIS — M545 Low back pain, unspecified: Secondary | ICD-10-CM | POA: Diagnosis not present

## 2024-08-12 ENCOUNTER — Other Ambulatory Visit: Payer: Self-pay | Admitting: Family Medicine

## 2024-08-12 ENCOUNTER — Ambulatory Visit: Admitting: Endocrinology

## 2024-08-23 ENCOUNTER — Other Ambulatory Visit: Payer: Self-pay

## 2024-08-23 ENCOUNTER — Encounter: Payer: Self-pay | Admitting: Endocrinology

## 2024-08-23 DIAGNOSIS — E119 Type 2 diabetes mellitus without complications: Secondary | ICD-10-CM

## 2024-08-23 MED ORDER — FREESTYLE LIBRE 3 PLUS SENSOR MISC: 1.0000 | 3 refills | Status: DC

## 2024-08-30 DIAGNOSIS — F4320 Adjustment disorder, unspecified: Secondary | ICD-10-CM | POA: Diagnosis not present

## 2024-09-06 DIAGNOSIS — F4320 Adjustment disorder, unspecified: Secondary | ICD-10-CM | POA: Diagnosis not present

## 2024-09-07 ENCOUNTER — Encounter: Payer: Self-pay | Admitting: Endocrinology

## 2024-09-07 DIAGNOSIS — E11649 Type 2 diabetes mellitus with hypoglycemia without coma: Secondary | ICD-10-CM

## 2024-09-08 DIAGNOSIS — M961 Postlaminectomy syndrome, not elsewhere classified: Secondary | ICD-10-CM | POA: Diagnosis not present

## 2024-09-08 DIAGNOSIS — M5416 Radiculopathy, lumbar region: Secondary | ICD-10-CM | POA: Diagnosis not present

## 2024-09-08 DIAGNOSIS — R252 Cramp and spasm: Secondary | ICD-10-CM | POA: Diagnosis not present

## 2024-09-08 DIAGNOSIS — Z79891 Long term (current) use of opiate analgesic: Secondary | ICD-10-CM | POA: Diagnosis not present

## 2024-09-08 DIAGNOSIS — S14109S Unspecified injury at unspecified level of cervical spinal cord, sequela: Secondary | ICD-10-CM | POA: Diagnosis not present

## 2024-09-08 DIAGNOSIS — G8929 Other chronic pain: Secondary | ICD-10-CM | POA: Diagnosis not present

## 2024-09-08 DIAGNOSIS — M545 Low back pain, unspecified: Secondary | ICD-10-CM | POA: Diagnosis not present

## 2024-09-08 MED ORDER — INSULIN PEN NEEDLE 31G X 5 MM MISC: 4 refills | Status: AC

## 2024-09-08 NOTE — Telephone Encounter (Signed)
 Sent refill

## 2024-09-09 ENCOUNTER — Ambulatory Visit: Admitting: Physician Assistant

## 2024-09-09 DIAGNOSIS — R3911 Hesitancy of micturition: Secondary | ICD-10-CM

## 2024-09-10 ENCOUNTER — Other Ambulatory Visit: Payer: Self-pay

## 2024-09-13 DIAGNOSIS — F4320 Adjustment disorder, unspecified: Secondary | ICD-10-CM | POA: Diagnosis not present

## 2024-09-14 ENCOUNTER — Encounter: Payer: Self-pay | Admitting: Family Medicine

## 2024-09-14 ENCOUNTER — Ambulatory Visit (INDEPENDENT_AMBULATORY_CARE_PROVIDER_SITE_OTHER): Admitting: Family Medicine

## 2024-09-14 VITALS — BP 100/63 | HR 101 | Ht 76.0 in | Wt 153.0 lb

## 2024-09-14 DIAGNOSIS — Z13228 Encounter for screening for other metabolic disorders: Secondary | ICD-10-CM

## 2024-09-14 DIAGNOSIS — Z136 Encounter for screening for cardiovascular disorders: Secondary | ICD-10-CM

## 2024-09-14 DIAGNOSIS — Z0000 Encounter for general adult medical examination without abnormal findings: Secondary | ICD-10-CM | POA: Diagnosis not present

## 2024-09-14 DIAGNOSIS — Z1159 Encounter for screening for other viral diseases: Secondary | ICD-10-CM | POA: Diagnosis not present

## 2024-09-14 DIAGNOSIS — Z1211 Encounter for screening for malignant neoplasm of colon: Secondary | ICD-10-CM

## 2024-09-14 DIAGNOSIS — Z114 Encounter for screening for human immunodeficiency virus [HIV]: Secondary | ICD-10-CM | POA: Diagnosis not present

## 2024-09-14 DIAGNOSIS — Z130 Encounter for screening for diseases of the blood and blood-forming organs and certain disorders involving the immune mechanism: Secondary | ICD-10-CM

## 2024-09-14 DIAGNOSIS — F988 Other specified behavioral and emotional disorders with onset usually occurring in childhood and adolescence: Secondary | ICD-10-CM | POA: Diagnosis not present

## 2024-09-14 DIAGNOSIS — Z1329 Encounter for screening for other suspected endocrine disorder: Secondary | ICD-10-CM | POA: Diagnosis not present

## 2024-09-14 NOTE — Progress Notes (Signed)
 Established Patient Office Visit  Subjective    Patient ID: Carlos [name]., male    DOB: 1971/07/16  Age: 53 y.o. MRN: [id number]  CC:  Chief Complaint  Patient presents with   Annual Exam    Annual physical and cologaurd  Pt is interested in getting an adderral prescription     HPI Carlos S Samson Jr. presents for routine annual exam. Patient reports that he has difficulty with his attention and would like to be tested/medicated for ADD.   Outpatient Encounter Medications as of 09/14/2024  Medication Sig   aspirin  EC 81 MG EC tablet Take 1 tablet (81 mg total) by mouth daily. Swallow whole.   B Complex Vitamins (VITAMIN B COMPLEX ) TABS Take1 tablet by mouth daily.   baclofen  (LIORESAL ) 20 MG tablet Take 2 tablets (40 mg total) by mouth 3 (three) times daily. For spasticity (Patient taking differently: Take 40 mg by mouth 3 (three) times daily. For spasticity  Pt reports he is only taking 1 pill a day)   buprenorphine (BUTRANS) 20 MCG/HR PTWK Place 1 patch onto the skin once a week.   Continuous Glucose Sensor (FREESTYLE LIBRE 3 PLUS SENSOR) MISC Inject 1 Device into the skin continuous. Change every 15 days   docusate sodium  (COLACE) 100 MG capsule Take 100 mg by mouth 2 (two) times daily.   gabapentin  (NEURONTIN ) 300 MG capsule Take 1 capsule (300 mg total) by mouth 3 (three) times daily.   Glucagon  (GVOKE HYPOPEN  1-PACK) 1 MG/0.2ML SOAJ Inject 1 mg into the skin as needed (low blood sugar with impaired consciousness).   insulin  glargine (LANTUS  SOLOSTAR) 100 UNIT/ML Solostar Pen Inject 15 Units into the skin daily.   insulin  lispro (HUMALOG  KWIKPEN) 100 UNIT/ML KwikPen Take 1-3 units  BEFORE each meal 3 times a day.   Insulin  Pen Needle 31G X 5 MM MISC With pen   metFORMIN  (GLUCOPHAGE ) 1000 MG tablet TAKE 1 TABLET (1,000 MG TOTAL) BY MOUTH TWICE A DAY WITH FOOD   mirtazapine  (REMERON ) 15 MG tablet TAKE 1 TABLET BY MOUTH DAILY AT  BEDTIME   Multiple Vitamin (MULTIVITAMIN WITH  MINERALS) TABS tablet Take 1 tablet by mouth daily.   oxyCODONE -acetaminophen  (PERCOCET) 10-325 MG tablet Take 1 tablet by mouth every 6 (six) hours as needed.   tamsulosin  (FLOMAX ) 0.4 MG CAPS capsule Take 2 capsules (0.8 mg total) by mouth daily.   acetaminophen  (TYLENOL ) 325 MG tablet Take 1-2 tablets (325-650 mg total) by mouth every 4 (four) hours as needed for mild pain. (Patient not taking: Reported on 09/14/2024)   cyclobenzaprine  (FLEXERIL ) 10 MG tablet TAKE 1 TABLET BY MOUTH TWICE A DAY AS NEEDED FOR MUSCLE SPASMS (Patient taking differently: Take 10 mg by mouth as needed.)   doxepin  (SINEQUAN ) 150 MG capsule TAKE 1 CAPSULE BY MOUTH AT BEDTIME.   doxepin  (SINEQUAN ) 50 MG capsule Take 1 capsule (50 mg total) by mouth at bedtime as needed.   DULoxetine  (CYMBALTA ) 60 MG capsule TAKE 1 CAPSULE BY MOUTH EVERY DAY (Patient not taking: Reported on 06/11/2024)   lamoTRIgine  (LAMICTAL ) 25 MG tablet One twice a day xone week, then 2 tabs twice a day (Patient not taking: Reported on 06/11/2024)   lidocaine  4 % Place 1 patch onto the skin daily. (Patient not taking: Reported on 06/11/2024)   naloxone [facility]) nasal spray 4 mg/0.1 mL    ondansetron  (ZOFRAN -ODT) 4 MG disintegrating tablet Take 1 tablet (4 mg total) by mouth every 8 (eight) hours as needed. (Patient  not taking: Reported on 09/14/2024)   sildenafil  (VIAGRA ) 100 MG tablet Take 0.5-1 tablets (50-100 mg total) by mouth daily as needed for erectile dysfunction.   [DISCONTINUED] ALPRAZolam  (XANAX ) 1 MG tablet Take 1-2 tablets 30 minutes prior to MRI, may repeat once as needed. Must have driver. (Patient not taking: Reported on 06/11/2024)   [DISCONTINUED] atorvastatin  (LIPITOR) 10 MG tablet Take 1 tablet (10 mg total) by mouth daily.   [DISCONTINUED] diazepam  (VALIUM ) 5 MG tablet Take 1 tablet (5 mg total) by mouth 2 (two) times daily. (Patient not taking: Reported on 06/11/2024)   [DISCONTINUED] diazepam  (VALIUM ) 5 MG tablet Take 1 tablet (5 mg  total) by mouth every 12 (twelve) hours as needed for anxiety (spasms). (Patient not taking: Reported on 06/11/2024)   No facility-administered encounter medications on file as of 09/14/2024.    Past Medical History:  Diagnosis Date   ADHD (attention deficit hyperactivity disorder)    Alcohol abuse 12/23/2012   Depression    Diabetes mellitus without complication (HCC)    History of exercise stress test    03-18-2013--  normal   History of kidney stones    Hyperlipidemia    Kidney stones    Left ureteral stone    Spasticity     Past Surgical History:  Procedure Laterality Date   ANTERIOR CERVICAL DECOMP/DISCECTOMY FUSION N/A 08/30/2021   Procedure: CERVICAL FIVE-SIX ANTERIOR CERVICAL DECOMPRESSION/DISCECTOMY FUSION;  Surgeon: Debby Dorn MATSU, MD;  Location[facility] OR;  Service: Neurosurgery;  Laterality: N/A;   CARDIAC CATHETERIZATION  11-27-2006  dr ellin   non-obstructive CAD/  20% proximal and mid LAD/  perserved LVF,  ef 55-60%   CYSTOSCOPY W/ RETROGRADES Left 05/17/2015   Procedure: CYSTOSCOPY WITH RETROGRADE PYELOGRAM;  Surgeon: Donnice Brooks, MD;  Location: [facility];  Service: Urology;  Laterality: Left;   CYSTOSCOPY/URETEROSCOPY/HOLMIUM LASER/STENT PLACEMENT Left 05/17/2015   Procedure: LEFT URETEROSCOPY/HOLMIUM LASER/STENT PLACEMENT;  Surgeon: Donnice Brooks, MD;  Location: [facility];  Service: Urology;  Laterality: Left;   EXTRACORPOREAL SHOCK WAVE LITHOTRIPSY Left 05-08-2015   EXTRACTION RIGHT MANDIBULAR , PREMOLAR/  MAXILLARY MANDIBULAR FIXATION WITH SCREWS  08-03-2008   LUMBAR PERCUTANEOUS PEDICLE SCREW 4 LEVEL N/A 09/04/2021   Procedure: Thoracic Four-Thoracic Eight  Percutaneous Instrumented Fusion;  Surgeon: Debby Dorn MATSU, MD;  Location: [facility] OR;  Service: Neurosurgery;  Laterality: N/A;   ORIF FOUR HOLD PLATE AND MAXILLOMANDIBULAR FIXATION W/ ARCH BARS  08-05-2008   REMOVAL ARCH BARS 09-15-2008   TRANSTHORACIC ECHOCARDIOGRAM   04-06-2008  dr ellin   normal LVF,  ef 55-60%,  trivial MR and TR    Family History  Problem Relation Age of Onset   Colon cancer Mother 5       passed 03/2024   Cancer Mother    Diabetes Mother        type 1   Heart disease Mother        pacemaker   Cancer - Colon Mother    Alcohol abuse Father    Alcohol abuse Maternal Uncle    Diabetes Maternal Grandmother    Alcohol abuse Maternal Grandfather    Diabetes Maternal Grandfather    Prostate cancer Neg Hx    Rectal cancer Neg Hx    Stomach cancer Neg Hx     Social History   Socioeconomic History   Marital status: Married    Spouse name: Not on file   Number of children: Not on file   Years of education: Not on file  Highest education level: Not on file  Occupational History   Not on file  Tobacco Use   Smoking status: Never   Smokeless tobacco: Never  Vaping Use   Vaping status: Never Used  Substance and Sexual Activity   Alcohol use: Yes    Alcohol/week: 3.0 standard drinks of alcohol    Types: 3 Cans of beer per week    Comment: Occassionally.   Drug use: No   Sexual activity: Not Currently  Other Topics Concern   Not on file  Social History Narrative   ** Merged History Encounter **       Social Drivers of Corporate investment banker Strain: Not on file  Food Insecurity: Not on file  Transportation Needs: Not on file  Physical Activity: Not on file  Stress: Not on file  Social Connections: Not on file  Intimate Partner Violence: Not on file    Review of Systems  All other systems reviewed and are negative.       Objective    BP 100/63   Pulse (!) 101   Ht 6' 4 (1.93 m)   Wt 153 lb (69.4 kg)   SpO2 93%   BMI 18.62 kg/m   Physical Exam Vitals and nursing note reviewed.  Constitutional:      General: He is not in acute distress. HENT:     Head: Normocephalic and atraumatic.     Right Ear: Tympanic membrane, ear canal and external ear normal.     Left Ear: Tympanic membrane, ear  canal and external ear normal.     Nose: Nose normal.     Mouth/Throat:     Mouth: Mucous membranes are moist.     Pharynx: Oropharynx is clear.  Eyes:     Conjunctiva/sclera: Conjunctivae normal.     Pupils: Pupils are equal, round, and reactive to light.  Neck:     Thyroid : No thyromegaly.  Cardiovascular:     Rate and Rhythm: Normal rate and regular rhythm.     Heart sounds: Normal heart sounds. No murmur heard. Pulmonary:     Effort: Pulmonary effort is normal.     Breath sounds: Normal breath sounds.  Abdominal:     General: There is no distension.     Palpations: Abdomen is soft. There is no mass.     Tenderness: There is no abdominal tenderness.     Hernia: There is no hernia in the left inguinal area or right inguinal area.  Musculoskeletal:        General: Normal range of motion.     Cervical back: Normal range of motion and neck supple.     Right lower leg: No edema.     Left lower leg: No edema.  Skin:    General: Skin is warm and dry.  Neurological:     General: No focal deficit present.     Mental Status: He is alert and oriented to person, place, and time. Mental status is at baseline.  Psychiatric:        Mood and Affect: Mood normal.        Behavior: Behavior normal.         Assessment & Plan:   Annual physical exam -     CMP14+EGFR  Screening for deficiency anemia -     CBC with Differential/Platelet  Encounter for screening for cardiovascular disorders -     Lipid panel  Screening for endocrine/metabolic/immunity disorders -     Hepatitis C antibody -  VITAMIN D 25 Hydroxy (Vit-D Deficiency, Fractures) -     Hemoglobin A1c  Screening for colon cancer -     Cologuard  Screening for HIV (human immunodeficiency virus) -     HIV Antibody (routine testing w rflx)  Need for hepatitis C screening test -     Hepatitis C antibody  Attention deficit disorder, unspecified type -     Ambulatory referral to Psychology   Patient left  purposefully without having labs drawn.   No follow-ups on file.   Tanda Raguel SQUIBB, MD

## 2024-09-15 ENCOUNTER — Encounter: Payer: Self-pay | Admitting: Family Medicine

## 2024-09-27 DIAGNOSIS — F4320 Adjustment disorder, unspecified: Secondary | ICD-10-CM | POA: Diagnosis not present

## 2024-10-04 DIAGNOSIS — F4320 Adjustment disorder, unspecified: Secondary | ICD-10-CM | POA: Diagnosis not present

## 2024-10-05 ENCOUNTER — Telehealth: Payer: Self-pay

## 2024-10-05 NOTE — Telephone Encounter (Signed)
 [facility] called to see if office had an alternate number for patient as they have been unable to get in touch concerning his diabetic CGM supplies. The number they have on file is same as office. Patient spouse number provided.

## 2024-10-06 DIAGNOSIS — Z79891 Long term (current) use of opiate analgesic: Secondary | ICD-10-CM | POA: Diagnosis not present

## 2024-10-06 DIAGNOSIS — M961 Postlaminectomy syndrome, not elsewhere classified: Secondary | ICD-10-CM | POA: Diagnosis not present

## 2024-10-06 DIAGNOSIS — S14109S Unspecified injury at unspecified level of cervical spinal cord, sequela: Secondary | ICD-10-CM | POA: Diagnosis not present

## 2024-10-06 DIAGNOSIS — M5416 Radiculopathy, lumbar region: Secondary | ICD-10-CM | POA: Diagnosis not present

## 2024-10-06 DIAGNOSIS — G8929 Other chronic pain: Secondary | ICD-10-CM | POA: Diagnosis not present

## 2024-10-06 DIAGNOSIS — M545 Low back pain, unspecified: Secondary | ICD-10-CM | POA: Diagnosis not present

## 2024-10-06 DIAGNOSIS — R252 Cramp and spasm: Secondary | ICD-10-CM | POA: Diagnosis not present

## 2024-10-11 DIAGNOSIS — F4320 Adjustment disorder, unspecified: Secondary | ICD-10-CM | POA: Diagnosis not present

## 2024-10-18 ENCOUNTER — Encounter: Payer: Self-pay | Admitting: Radiology

## 2024-10-18 DIAGNOSIS — F4320 Adjustment disorder, unspecified: Secondary | ICD-10-CM | POA: Diagnosis not present

## 2024-10-22 ENCOUNTER — Ambulatory Visit: Admitting: Physician Assistant

## 2024-10-25 DIAGNOSIS — F4320 Adjustment disorder, unspecified: Secondary | ICD-10-CM | POA: Diagnosis not present

## 2024-10-29 ENCOUNTER — Encounter: Payer: Self-pay | Admitting: Family Medicine

## 2024-11-01 DIAGNOSIS — F4320 Adjustment disorder, unspecified: Secondary | ICD-10-CM | POA: Diagnosis not present

## 2024-11-05 ENCOUNTER — Encounter: Payer: Self-pay | Admitting: Pediatrics

## 2024-11-08 ENCOUNTER — Encounter: Payer: Self-pay | Admitting: Endocrinology

## 2024-11-08 ENCOUNTER — Encounter (INDEPENDENT_AMBULATORY_CARE_PROVIDER_SITE_OTHER): Payer: Self-pay | Admitting: Neurology

## 2024-11-08 DIAGNOSIS — R252 Cramp and spasm: Secondary | ICD-10-CM

## 2024-11-08 DIAGNOSIS — R269 Unspecified abnormalities of gait and mobility: Secondary | ICD-10-CM

## 2024-11-08 DIAGNOSIS — M62838 Other muscle spasm: Secondary | ICD-10-CM

## 2024-11-08 DIAGNOSIS — F4320 Adjustment disorder, unspecified: Secondary | ICD-10-CM | POA: Diagnosis not present

## 2024-11-08 NOTE — Telephone Encounter (Signed)
 Noted

## 2024-11-08 NOTE — Telephone Encounter (Signed)
 Last visit was in April, he needs to be seen again to discuss medication options.  Ok to see NP

## 2024-11-09 MED ORDER — GABAPENTIN 300 MG PO CAPS: 300.0000 mg | ORAL_CAPSULE | Freq: Three times a day (TID) | ORAL | 11 refills | Status: AC

## 2024-11-09 NOTE — Telephone Encounter (Signed)
 I called, failed to reach patient and his wife.

## 2024-11-09 NOTE — Addendum Note (Signed)
 Addended by: Leverett Camplin on: 11/09/2024 03:51 PM   Modules accepted: Orders

## 2024-11-09 NOTE — Telephone Encounter (Signed)
 Patient called, scheduled earliest next available with Dr. Onita due to was not anything available with NP. Patient said pregablin to stop the pain. Took what was left the last time tried it and that seem to help.

## 2024-11-09 NOTE — Telephone Encounter (Signed)
Please see the MyChart message reply(ies) for my assessment and plan.    This patient gave consent for this Medical Advice Message and is aware that it may result in a bill to [employer], as well as the possibility of receiving a bill for a co-payment or deductible. They are an established patient, but are not seeking medical advice exclusively about a problem treated during an in person or video visit in the last seven days. I did not recommend an in person or video visit within seven days of my reply.    I spent a total of 7 minutes cumulative time within 7 days through [employer].  Marcial Pacas, MD

## 2024-11-10 DIAGNOSIS — G8929 Other chronic pain: Secondary | ICD-10-CM | POA: Diagnosis not present

## 2024-11-10 DIAGNOSIS — Z79891 Long term (current) use of opiate analgesic: Secondary | ICD-10-CM | POA: Diagnosis not present

## 2024-11-10 DIAGNOSIS — M961 Postlaminectomy syndrome, not elsewhere classified: Secondary | ICD-10-CM | POA: Diagnosis not present

## 2024-11-10 DIAGNOSIS — M5416 Radiculopathy, lumbar region: Secondary | ICD-10-CM | POA: Diagnosis not present

## 2024-11-10 DIAGNOSIS — M545 Low back pain, unspecified: Secondary | ICD-10-CM | POA: Diagnosis not present

## 2024-11-10 DIAGNOSIS — S14109S Unspecified injury at unspecified level of cervical spinal cord, sequela: Secondary | ICD-10-CM | POA: Diagnosis not present

## 2024-11-10 DIAGNOSIS — R252 Cramp and spasm: Secondary | ICD-10-CM | POA: Diagnosis not present

## 2024-11-15 DIAGNOSIS — F4320 Adjustment disorder, unspecified: Secondary | ICD-10-CM | POA: Diagnosis not present

## 2024-11-16 ENCOUNTER — Ambulatory Visit (INDEPENDENT_AMBULATORY_CARE_PROVIDER_SITE_OTHER): Admitting: Endocrinology

## 2024-11-16 ENCOUNTER — Encounter: Payer: Self-pay | Admitting: Endocrinology

## 2024-11-16 ENCOUNTER — Other Ambulatory Visit

## 2024-11-16 ENCOUNTER — Ambulatory Visit: Payer: Self-pay | Admitting: Endocrinology

## 2024-11-16 DIAGNOSIS — Z794 Long term (current) use of insulin: Secondary | ICD-10-CM | POA: Diagnosis not present

## 2024-11-16 DIAGNOSIS — E11649 Type 2 diabetes mellitus with hypoglycemia without coma: Secondary | ICD-10-CM | POA: Diagnosis not present

## 2024-11-16 DIAGNOSIS — E119 Type 2 diabetes mellitus without complications: Secondary | ICD-10-CM

## 2024-11-16 DIAGNOSIS — E1165 Type 2 diabetes mellitus with hyperglycemia: Secondary | ICD-10-CM | POA: Diagnosis not present

## 2024-11-16 DIAGNOSIS — E1169 Type 2 diabetes mellitus with other specified complication: Secondary | ICD-10-CM | POA: Diagnosis not present

## 2024-11-16 LAB — POCT GLYCOSYLATED HEMOGLOBIN (HGB A1C): Hemoglobin A1C: 8.1 % — AB (ref 4.0–5.6)

## 2024-11-16 MED ORDER — METFORMIN HCL 1000 MG PO TABS: ORAL_TABLET | ORAL | 3 refills | Status: AC

## 2024-11-16 MED ORDER — INSULIN LISPRO (1 UNIT DIAL) 100 UNIT/ML (KWIKPEN): PEN_INJECTOR | SUBCUTANEOUS | 3 refills | Status: DC

## 2024-11-16 MED ORDER — FREESTYLE LIBRE 3 PLUS SENSOR MISC: 1.0000 | 3 refills | Status: DC

## 2024-11-16 MED ORDER — LANTUS SOLOSTAR 100 UNIT/ML ~~LOC~~ SOPN: 18.0000 [IU] | PEN_INJECTOR | Freq: Every day | SUBCUTANEOUS | 3 refills | Status: DC
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
## 2024-11-17 LAB — MICROALBUMIN / CREATININE URINE RATIO
Creatinine, Urine: 78 mg/dL (ref 20–320)
Microalb, Ur: <0.2 mg/dL

## 2024-11-17 MED ORDER — PREGABALIN 100 MG PO CAPS: 100.0000 mg | ORAL_CAPSULE | Freq: Three times a day (TID) | ORAL | 5 refills | Status: AC

## 2024-11-17 NOTE — Addendum Note (Signed)
 Addended by: Grover Robinson on: 11/17/2024 03:14 PM   Modules accepted: Orders

## 2024-11-22 ENCOUNTER — Telehealth: Payer: Self-pay

## 2024-11-22 ENCOUNTER — Other Ambulatory Visit (HOSPITAL_COMMUNITY): Payer: Self-pay

## 2024-11-22 NOTE — Telephone Encounter (Signed)
 Pharmacy Patient Advocate Encounter   Received notification from CoverMyMeds that prior authorization for Pregabalin  is required/requested.   Insurance verification completed.   The patient is insured through [facility].   Per test claim: PA required; PA submitted to above mentioned insurance via Latent Key/confirmation #/EOC [id number] Status is pending

## 2024-11-22 NOTE — Telephone Encounter (Signed)
 Pharmacy Patient Advocate Encounter  Received notification from [facility] that Prior Authorization for Pregabalin  100MG  capsules  has been APPROVED from 11/22/2024 to 12/15/2024. Unable to obtain price due to refill too soon rejection, last fill date 11/17/2024 next available fill date12/26/2025   PA #/Case ID/Reference #: [id number]

## 2024-11-25 NOTE — Telephone Encounter (Signed)
 Ok thank you

## 2024-11-29 DIAGNOSIS — F4320 Adjustment disorder, unspecified: Secondary | ICD-10-CM | POA: Diagnosis not present

## 2024-12-05 ENCOUNTER — Other Ambulatory Visit: Payer: Self-pay | Admitting: Medical Genetics

## 2024-12-05 DIAGNOSIS — M62838 Other muscle spasm: Secondary | ICD-10-CM | POA: Diagnosis not present

## 2024-12-05 DIAGNOSIS — R252 Cramp and spasm: Secondary | ICD-10-CM | POA: Diagnosis not present

## 2024-12-05 DIAGNOSIS — M79605 Pain in left leg: Secondary | ICD-10-CM | POA: Diagnosis not present

## 2024-12-05 DIAGNOSIS — M79604 Pain in right leg: Secondary | ICD-10-CM | POA: Diagnosis not present

## 2024-12-07 DIAGNOSIS — M5416 Radiculopathy, lumbar region: Secondary | ICD-10-CM | POA: Diagnosis not present

## 2024-12-07 DIAGNOSIS — Z79891 Long term (current) use of opiate analgesic: Secondary | ICD-10-CM | POA: Diagnosis not present

## 2024-12-07 DIAGNOSIS — M961 Postlaminectomy syndrome, not elsewhere classified: Secondary | ICD-10-CM | POA: Diagnosis not present

## 2024-12-07 DIAGNOSIS — R252 Cramp and spasm: Secondary | ICD-10-CM | POA: Diagnosis not present

## 2024-12-07 DIAGNOSIS — S14109S Unspecified injury at unspecified level of cervical spinal cord, sequela: Secondary | ICD-10-CM | POA: Diagnosis not present

## 2024-12-07 DIAGNOSIS — M545 Low back pain, unspecified: Secondary | ICD-10-CM | POA: Diagnosis not present

## 2024-12-07 DIAGNOSIS — G8929 Other chronic pain: Secondary | ICD-10-CM | POA: Diagnosis not present

## 2024-12-08 ENCOUNTER — Ambulatory Visit

## 2024-12-08 ENCOUNTER — Telehealth: Payer: Self-pay | Admitting: *Deleted

## 2024-12-08 VITALS — Ht 76.0 in | Wt 155.0 lb

## 2024-12-08 NOTE — Telephone Encounter (Signed)
 When called for PV pt states he has been having issues swallowing and desires an Endo/Colon. OV with Dr. Suzann made for 01/20/25 @ 10:0 am and instructed to come 15 min prior to visit. Pt states he requests that any procedure  be scheduled at any first week of any month. No other questions at this time.

## 2024-12-22 ENCOUNTER — Encounter: Admitting: Pediatrics

## 2024-12-31 ENCOUNTER — Other Ambulatory Visit: Payer: Self-pay

## 2024-12-31 ENCOUNTER — Encounter: Payer: Self-pay | Admitting: Endocrinology

## 2024-12-31 ENCOUNTER — Telehealth: Payer: Self-pay

## 2024-12-31 NOTE — Telephone Encounter (Signed)
 Patient sent message stating he was almost out of his insulin  lispro. After reviewing the chart patient was given a 90 day supply with 3 refills on 11/16/24. Patient called to discuss how he was taking the insulin , as this dosage at that max should last 90 days for him.   Patient states he does not always take the 5 units each meal for fear of dropping too fast, he waits and takes it after when its gone up. RN explained to patient the significance of following MD instructions in order to get the maximum benefits of care. Explained to patient he can take as little as 1 unit or up to 5 units. Patient also informed that if he is not using the max dosage of 15 units he should not be running out so soon. Patient informed RN he keeps one in the car and one in spouse purse etc to make sure that he always has one. RN explained that we can place an order however his insurance may not cover it because the refill would be too soon. Patient also asked if he checked with pharmacy for the refills, he stated he hadn't but will call them after we ended the call. Patient thanked CHARITY FUNDRAISER with understanding and no further questions.

## 2025-01-03 NOTE — Telephone Encounter (Signed)
 As you have mentioned, he needs to take NovoLog  before eating depending upon size of the meal 1 to 5 units before each meal.  He can use following sliding scale for correction of high blood sugar before eating.  Sliding Scale Blood Glucose        Insulin  60-150                     None 151-200                   None 201-250                   1 units 251-300                   2 units 301-350                   3 units 351-400                   4 units      >400                        5 units and call provider    Katelinn Justice, MD [facility] Endocrinology [facility] [address] [place], [address] [place], [place] [address] Phone # [phone]

## 2025-01-04 ENCOUNTER — Encounter: Payer: Self-pay | Admitting: Family Medicine

## 2025-01-04 ENCOUNTER — Other Ambulatory Visit: Payer: Self-pay | Admitting: Endocrinology

## 2025-01-04 DIAGNOSIS — E119 Type 2 diabetes mellitus without complications: Secondary | ICD-10-CM

## 2025-01-04 MED ORDER — FREESTYLE LIBRE 3 PLUS SENSOR MISC: 3 refills | Status: AC

## 2025-01-05 ENCOUNTER — Telehealth: Payer: Self-pay | Admitting: Internal Medicine

## 2025-01-05 NOTE — Telephone Encounter (Signed)
 Received a call from the access nurse at 1930 on 01/04/2025 with hyperglycemia BG > 300 mg/dL    Pt on Lantus  18 units and Humalog  1-5 units with each meal .  His last prandial intake was at lunchtime  Patient was asking for his insulin  to be changed, patient was advised to use Humalog  as below every 4 hours until his BG readings are below 250 MGs/DL  Patient also advised to avoid eating meals during that time until his BG is less than 250 MGs/DL, he was also advised to take Humalog  prandial dose as normal after that   Humalog  correctional insulin :  Blood sugar before meal Number of units to inject  165 -  200 1 units  201- 235  2 units  236 -  270 3 units  271 -  305 4 units  306 -  340 5 units  341 -  375 6 units     Will defer further management to primary endocrinologist   Abby Redgie Butts, MD  [facility] Endocrinology  [facility] [address] [place], [place] [address] Phone: [phone] FAX: [phone]

## 2025-01-06 ENCOUNTER — Other Ambulatory Visit: Payer: Self-pay | Admitting: Endocrinology

## 2025-01-06 ENCOUNTER — Telehealth (INDEPENDENT_AMBULATORY_CARE_PROVIDER_SITE_OTHER)

## 2025-01-06 ENCOUNTER — Encounter: Payer: Self-pay | Admitting: Endocrinology

## 2025-01-06 DIAGNOSIS — E1169 Type 2 diabetes mellitus with other specified complication: Secondary | ICD-10-CM

## 2025-01-06 DIAGNOSIS — Z794 Long term (current) use of insulin: Secondary | ICD-10-CM

## 2025-01-06 DIAGNOSIS — E119 Type 2 diabetes mellitus without complications: Secondary | ICD-10-CM

## 2025-01-06 MED ORDER — INSULIN LISPRO (1 UNIT DIAL) 100 UNIT/ML (KWIKPEN): 5.0000 [IU] | PEN_INJECTOR | Freq: Three times a day (TID) | SUBCUTANEOUS | 3 refills | Status: DC

## 2025-01-06 MED ORDER — LANTUS SOLOSTAR 100 UNIT/ML ~~LOC~~ SOPN: 22.0000 [IU] | PEN_INJECTOR | Freq: Every day | SUBCUTANEOUS | 3 refills | Status: AC

## 2025-01-06 NOTE — Addendum Note (Signed)
 Addended by: Lugenia Assefa on: 01/06/2025 12:50 PM   Modules accepted: Orders

## 2025-01-06 NOTE — Telephone Encounter (Addendum)
 Called patient and discussed his recent concerns regarding hyperglycemia.  Freestyle libre CGM data downloaded and reviewed as follows.  CGM data from January 9 to January 06, 2025 reviewed and interpreted as follows.  Detailed data is scanned into media.  Interpretation: Significant hyperglycemia with blood sugar mostly 300-350 range and rarely up to 400.  Some of the time blood sugar in 150-200s range overnight and in between the meals.  No hypoglycemia.  He reports he is having increased stress lately however eating and physical activity about the same.  Plan: - He is currently taking Lantus  18 units daily and NovoLog  sliding scale every 4 hours. -Increase Lantus  to 22 units daily. - Take NovoLog  5 to 8 units with meals 3 times a day before eating, 5 units for a small meal and up to 8 units for large meal and sliding scale for correcting hyperglycemia as follows. - Okay to correct hyperglycemia every 4 hours based on sliding scale as follows after 2 hours of eating.  Mild Sliding Scale Blood Glucose        Insulin  60-150                     None 151-200                   None 201-250                   1 units 251-300                   2 units 301-350                   3 units 351-400                   4 units      >400                        4 units and call provider   Encouraged to call our clinic with any questions regarding blood sugar.  Frady Taddeo, MD [facility] Endocrinology [facility] [address] [place], [address] [place], [place] [address] Phone # [phone]

## 2025-01-06 NOTE — Telephone Encounter (Signed)
 Patient called several times to discuss his blood glucose. RN spoke with patient in depth regarding non-compliance and importance of taking as MD directed. Spoke with MD yesterday, stated make him aware if patient called again after help from on call MD. Patient left message this morning requesting call back.

## 2025-01-06 NOTE — Telephone Encounter (Signed)
 New diabetes regimen: please forward to patient:  -Increase Lantus  to 22 units daily. - Take NovoLog  5 to 8 units with meals 3 times a day before eating, 5 units for a small meal and up to 8 units for large meal and sliding scale for correcting hyperglycemia as follows. - Okay to correct hyperglycemia every 4 hours based on sliding scale as follows after 2 hours of eating.  Mild Sliding Scale Blood Glucose        Insulin  60-150                     None 151-200                   None 201-250                   1 units 251-300                   2 units 301-350                   3 units 351-400                   4 units      >400                        4 units and call provider    Carlos Magallon, MD [facility] Endocrinology [facility] [address] [place], [address] [place], [place] [address] Phone # [phone]

## 2025-01-07 ENCOUNTER — Telehealth: Payer: Self-pay

## 2025-01-07 ENCOUNTER — Other Ambulatory Visit (HOSPITAL_COMMUNITY): Payer: Self-pay

## 2025-01-07 NOTE — Telephone Encounter (Signed)
 Pharmacy Patient Advocate Encounter   Received notification from Pt Calls Messages that prior authorization for Novolog  is required/requested.   Insurance verification completed.   The patient is insured through [employer].   Per test claim: Refill too soon. PA is not needed at this time. Medication was filled 01/06/2025. Next eligible fill date is 01/29/2025.

## 2025-01-07 NOTE — Telephone Encounter (Signed)
 PA needed for Novolog per patient

## 2025-01-12 LAB — COLOGUARD: COLOGUARD: POSITIVE — AB

## 2025-01-13 ENCOUNTER — Ambulatory Visit: Payer: Self-pay | Admitting: Family Medicine

## 2025-01-13 DIAGNOSIS — R195 Other fecal abnormalities: Secondary | ICD-10-CM

## 2025-01-13 NOTE — Progress Notes (Signed)
 Dr. Tanda gave results to pt in person today during wife's OV

## 2025-01-13 NOTE — Telephone Encounter (Signed)
 According to our records, his new insulin  dose is 5-8 units 3x daily with an additional sliding scale as needed. It is written that the max daily amount should not exceed 30 units. This should come out to 9mL of Novolog  per month.  I am seeing that he [facility]. By these calculations, that should be a 50 day supply. Since that was only 7 days ago, the insurance is not going to cover another fill right now, regardless of a prior authorization. They are saying the next available refill date is 01/29/2025. That will be less than 30 days from the date of his previous fill.   If this is not the case, and I am misunderstanding the current dosing, it is likely the pharmacy is too and may need additional clarification for an increased dose override but since this appears Novolog  is covered by his insurance, a PA at this time will not be helpful.
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
## 2025-01-20 ENCOUNTER — Other Ambulatory Visit

## 2025-01-20 ENCOUNTER — Ambulatory Visit: Admitting: Pediatrics

## 2025-01-20 ENCOUNTER — Encounter: Payer: Self-pay | Admitting: Pediatrics

## 2025-01-20 VITALS — BP 112/70 | HR 100 | Ht 76.0 in | Wt 154.0 lb

## 2025-01-20 DIAGNOSIS — Z800 Family history of malignant neoplasm of digestive organs: Secondary | ICD-10-CM

## 2025-01-20 DIAGNOSIS — K5900 Constipation, unspecified: Secondary | ICD-10-CM

## 2025-01-20 DIAGNOSIS — K592 Neurogenic bowel, not elsewhere classified: Secondary | ICD-10-CM | POA: Diagnosis not present

## 2025-01-20 DIAGNOSIS — R195 Other fecal abnormalities: Secondary | ICD-10-CM | POA: Diagnosis not present

## 2025-01-20 MED ORDER — NA SULFATE-K SULFATE-MG SULF 17.5-3.13-1.6 GM/177ML PO SOLN: 1.0000 | ORAL | 0 refills | Status: AC

## 2025-01-20 NOTE — Patient Instructions (Signed)
 We have sent the following medications to your pharmacy for you to pick up at your convenience: Suprep   We have given you samples of the following medication to take: Linzess 290 mcg- 1 pill once daily for 7 days. (Please send my chart message or contact office and let us  know if Linzess works for you.)   Please also remember to take 30 ml of Milk of Magnesium  once daily  3 days prior to your colonoscopy. See prep instructions for additional information.   We are referring you to Pelvic Floor Therapy.  They will contact you directly to schedule an appointment.  It may take a week or more before you hear from them.  Please feel free to contact us  if you have not heard from them within 2 weeks and we will follow up on the referral.   You have been scheduled for a colonoscopy. Please follow written instructions given to you at your visit today.   If you use inhalers (even only as needed), please bring them with you on the day of your procedure.  DO NOT TAKE 7 DAYS PRIOR TO TEST- Trulicity (dulaglutide) Ozempic , Wegovy  (semaglutide ) Mounjaro, Zepbound (tirzepatide) Bydureon Bcise (exanatide extended release)  DO NOT TAKE 1 DAY PRIOR TO YOUR TEST Rybelsus  (semaglutide ) Adlyxin (lixisenatide) Victoza (liraglutide) Byetta (exanatide) ___________________________________________________________________________   Due to recent changes in healthcare laws, you may see the results of your imaging and laboratory studies on MyChart before your provider has had a chance to review them.  We understand that in some cases there may be results that are confusing or concerning to you. Not all laboratory results come back in the same time frame and the provider may be waiting for multiple results in order to interpret others.  Please give us  48 hours in order for your provider to thoroughly review all the results before contacting the office for clarification of your results.   Thank you for choosing me and  [facility].  Dr.Nancy Mcgreal

## 2025-02-02 ENCOUNTER — Encounter: Admitting: Pediatrics

## 2025-02-15 ENCOUNTER — Ambulatory Visit: Admitting: Endocrinology

## 2025-03-28 ENCOUNTER — Ambulatory Visit: Admitting: Neurology
